# Patient Record
Sex: Female | Born: 1975 | Race: White | Hispanic: No | Marital: Married | Smoking: Former smoker
Health system: Southern US, Community
[De-identification: ages and names within clinical notes are randomized; demographics above are authoritative.]

## PROBLEM LIST (undated history)

## (undated) ENCOUNTER — Inpatient Hospital Stay: Payer: Self-pay

## (undated) DIAGNOSIS — I10 Essential (primary) hypertension: Secondary | ICD-10-CM

## (undated) DIAGNOSIS — I330 Acute and subacute infective endocarditis: Secondary | ICD-10-CM

## (undated) DIAGNOSIS — G2581 Restless legs syndrome: Secondary | ICD-10-CM

## (undated) DIAGNOSIS — B9562 Methicillin resistant Staphylococcus aureus infection as the cause of diseases classified elsewhere: Secondary | ICD-10-CM

## (undated) DIAGNOSIS — M1990 Unspecified osteoarthritis, unspecified site: Secondary | ICD-10-CM

## (undated) DIAGNOSIS — R7881 Bacteremia: Secondary | ICD-10-CM

## (undated) DIAGNOSIS — M869 Osteomyelitis, unspecified: Secondary | ICD-10-CM

## (undated) DIAGNOSIS — K56609 Unspecified intestinal obstruction, unspecified as to partial versus complete obstruction: Secondary | ICD-10-CM

## (undated) DIAGNOSIS — E119 Type 2 diabetes mellitus without complications: Secondary | ICD-10-CM

## (undated) DIAGNOSIS — F419 Anxiety disorder, unspecified: Secondary | ICD-10-CM

## (undated) DIAGNOSIS — G709 Myoneural disorder, unspecified: Secondary | ICD-10-CM

## (undated) DIAGNOSIS — M4626 Osteomyelitis of vertebra, lumbar region: Secondary | ICD-10-CM

## (undated) DIAGNOSIS — K3189 Other diseases of stomach and duodenum: Secondary | ICD-10-CM

## (undated) DIAGNOSIS — J45909 Unspecified asthma, uncomplicated: Secondary | ICD-10-CM

## (undated) HISTORY — DX: Other diseases of stomach and duodenum: K31.89

---

## 2010-04-16 ENCOUNTER — Emergency Department: Payer: Self-pay | Admitting: Emergency Medicine

## 2010-08-14 ENCOUNTER — Inpatient Hospital Stay: Payer: Self-pay | Admitting: Internal Medicine

## 2011-01-18 ENCOUNTER — Emergency Department: Payer: Self-pay | Admitting: Emergency Medicine

## 2011-04-29 ENCOUNTER — Ambulatory Visit: Payer: Self-pay | Admitting: Pain Medicine

## 2011-06-15 ENCOUNTER — Emergency Department: Payer: Self-pay | Admitting: Emergency Medicine

## 2011-10-21 ENCOUNTER — Encounter: Payer: Self-pay | Admitting: Internal Medicine

## 2011-11-15 ENCOUNTER — Encounter: Payer: Self-pay | Admitting: Internal Medicine

## 2011-12-21 ENCOUNTER — Emergency Department: Payer: Self-pay | Admitting: Emergency Medicine

## 2011-12-21 LAB — COMPREHENSIVE METABOLIC PANEL
Albumin: 4 g/dL (ref 3.4–5.0)
Alkaline Phosphatase: 60 U/L (ref 50–136)
Calcium, Total: 8.1 mg/dL — ABNORMAL LOW (ref 8.5–10.1)
Chloride: 118 mmol/L — ABNORMAL HIGH (ref 98–107)
Co2: 22 mmol/L (ref 21–32)
EGFR (African American): >60
Glucose: 102 mg/dL — ABNORMAL HIGH (ref 65–99)
Potassium: 4 mmol/L (ref 3.5–5.1)
SGOT(AST): 20 U/L (ref 15–37)
Sodium: 152 mmol/L — ABNORMAL HIGH (ref 136–145)
Total Protein: 7 g/dL (ref 6.4–8.2)

## 2011-12-21 LAB — CBC
HCT: 41.2 % (ref 35.0–47.0)
HGB: 14.2 g/dL (ref 12.0–16.0)
MCH: 33.8 pg (ref 26.0–34.0)
MCV: 98 fL (ref 80–100)
Platelet: 222 10*3/uL (ref 150–440)
RBC: 4.19 10*6/uL (ref 3.80–5.20)

## 2011-12-21 LAB — ETHANOL
Ethanol %: 0.184 % — ABNORMAL HIGH (ref 0.000–0.080)
Ethanol: 301 mg/dL

## 2012-01-13 ENCOUNTER — Ambulatory Visit: Payer: Self-pay | Admitting: Internal Medicine

## 2012-03-30 ENCOUNTER — Emergency Department: Payer: Self-pay | Admitting: Emergency Medicine

## 2012-03-30 LAB — COMPREHENSIVE METABOLIC PANEL
Albumin: 4.3 g/dL (ref 3.4–5.0)
Alkaline Phosphatase: 91 U/L (ref 50–136)
Anion Gap: 10 (ref 7–16)
BUN: 8 mg/dL (ref 7–18)
Calcium, Total: 9.6 mg/dL (ref 8.5–10.1)
Creatinine: 0.64 mg/dL (ref 0.60–1.30)
EGFR (African American): >60
Glucose: 122 mg/dL — ABNORMAL HIGH (ref 65–99)
Osmolality: 283 (ref 275–301)
SGOT(AST): 25 U/L (ref 15–37)
SGPT (ALT): 23 U/L

## 2012-03-30 LAB — URINALYSIS, COMPLETE
Bilirubin,UR: NEGATIVE
Blood: NEGATIVE
Glucose,UR: NEGATIVE mg/dL (ref 0–75)
Ph: 8 (ref 4.5–8.0)
Protein: 30
Squamous Epithelial: 13
WBC UR: NONE SEEN /HPF (ref 0–5)

## 2012-03-30 LAB — CBC
HGB: 14.5 g/dL (ref 12.0–16.0)
MCHC: 33 g/dL (ref 32.0–36.0)
MCV: 98 fL (ref 80–100)
Platelet: 281 10*3/uL (ref 150–440)
RBC: 4.47 10*6/uL (ref 3.80–5.20)
RDW: 12.4 % (ref 11.5–14.5)
WBC: 10.8 10*3/uL (ref 3.6–11.0)

## 2012-03-30 LAB — HCG, QUANTITATIVE, PREGNANCY: Beta Hcg, Quant.: <1 m[IU]/mL — ABNORMAL LOW

## 2012-03-30 LAB — LIPASE, BLOOD: Lipase: 73 U/L (ref 73–393)

## 2013-02-21 ENCOUNTER — Emergency Department: Payer: Self-pay | Admitting: Emergency Medicine

## 2014-03-01 ENCOUNTER — Emergency Department: Payer: Self-pay | Admitting: Emergency Medicine

## 2014-03-01 LAB — URINALYSIS, COMPLETE
BILIRUBIN, UR: NEGATIVE
Bacteria: NONE SEEN
Blood: NEGATIVE
Glucose,UR: 50 mg/dL (ref 0–75)
Leukocyte Esterase: NEGATIVE
NITRITE: NEGATIVE
PH: 8 (ref 4.5–8.0)
PROTEIN: NEGATIVE
RBC,UR: <1 /HPF (ref 0–5)
Specific Gravity: 1.012 (ref 1.003–1.030)
Squamous Epithelial: <1
WBC UR: 5 /HPF (ref 0–5)

## 2014-03-01 LAB — DRUG SCREEN, URINE
AMPHETAMINES, UR SCREEN: NEGATIVE (ref ?–1000)
Barbiturates, Ur Screen: NEGATIVE (ref ?–200)
Benzodiazepine, Ur Scrn: NEGATIVE (ref ?–200)
Cannabinoid 50 Ng, Ur ~~LOC~~: POSITIVE (ref ?–50)
Cocaine Metabolite,Ur ~~LOC~~: NEGATIVE (ref ?–300)
MDMA (ECSTASY) UR SCREEN: NEGATIVE (ref ?–500)
Methadone, Ur Screen: NEGATIVE (ref ?–300)
Opiate, Ur Screen: POSITIVE (ref ?–300)
Phencyclidine (PCP) Ur S: NEGATIVE (ref ?–25)
Tricyclic, Ur Screen: NEGATIVE (ref ?–1000)

## 2014-03-01 LAB — CBC WITH DIFFERENTIAL/PLATELET
BASOS ABS: 0.1 10*3/uL (ref 0.0–0.1)
Basophil %: 0.8 %
Eosinophil #: 0.1 10*3/uL (ref 0.0–0.7)
Eosinophil %: 0.9 %
HCT: 42.1 % (ref 35.0–47.0)
HGB: 14.2 g/dL (ref 12.0–16.0)
LYMPHS ABS: 2 10*3/uL (ref 1.0–3.6)
LYMPHS PCT: 17 %
MCH: 32.5 pg (ref 26.0–34.0)
MCHC: 33.7 g/dL (ref 32.0–36.0)
MCV: 96 fL (ref 80–100)
MONO ABS: 0.5 x10 3/mm (ref 0.2–0.9)
MONOS PCT: 4.5 %
NEUTROS ABS: 9 10*3/uL — AB (ref 1.4–6.5)
Neutrophil %: 76.8 %
Platelet: 324 10*3/uL (ref 150–440)
RBC: 4.37 10*6/uL (ref 3.80–5.20)
RDW: 12.5 % (ref 11.5–14.5)
WBC: 11.7 10*3/uL — ABNORMAL HIGH (ref 3.6–11.0)

## 2014-03-01 LAB — COMPREHENSIVE METABOLIC PANEL
ALT: 20 U/L (ref 12–78)
Albumin: 4.3 g/dL (ref 3.4–5.0)
Alkaline Phosphatase: 67 U/L
Anion Gap: 12 (ref 7–16)
BUN: 11 mg/dL (ref 7–18)
Bilirubin,Total: 0.5 mg/dL (ref 0.2–1.0)
CHLORIDE: 104 mmol/L (ref 98–107)
CO2: 18 mmol/L — AB (ref 21–32)
Calcium, Total: 9.2 mg/dL (ref 8.5–10.1)
Creatinine: 0.7 mg/dL (ref 0.60–1.30)
EGFR (Non-African Amer.): >60
GLUCOSE: 182 mg/dL — AB (ref 65–99)
Osmolality: 272 (ref 275–301)
Potassium: 3.5 mmol/L (ref 3.5–5.1)
SGOT(AST): 16 U/L (ref 15–37)
SODIUM: 134 mmol/L — AB (ref 136–145)
Total Protein: 7.4 g/dL (ref 6.4–8.2)

## 2014-03-01 LAB — LIPASE, BLOOD: Lipase: 96 U/L (ref 73–393)

## 2014-03-01 LAB — HCG, QUANTITATIVE, PREGNANCY

## 2014-03-01 LAB — TROPONIN I: Troponin-I: <0.02 ng/mL

## 2015-04-04 ENCOUNTER — Inpatient Hospital Stay: Admission: RE | Admit: 2015-04-04 | Payer: Self-pay | Source: Ambulatory Visit

## 2015-04-07 ENCOUNTER — Ambulatory Visit: Admission: RE | Admit: 2015-04-07 | Payer: Medicaid Other | Source: Ambulatory Visit

## 2015-04-07 ENCOUNTER — Encounter: Admission: RE | Payer: Self-pay | Source: Ambulatory Visit

## 2015-04-07 SURGERY — FUSION, JOINT, GREAT TOE
Anesthesia: LOCAL | Laterality: Right

## 2015-06-24 ENCOUNTER — Emergency Department
Admission: EM | Admit: 2015-06-24 | Discharge: 2015-06-24 | Disposition: A | Discharge status: Home / Self Care | Payer: Medicaid Other | Attending: Emergency Medicine | Admitting: Emergency Medicine

## 2015-06-24 DIAGNOSIS — Z720 Tobacco use: Secondary | ICD-10-CM | POA: Diagnosis not present

## 2015-06-24 DIAGNOSIS — Z3202 Encounter for pregnancy test, result negative: Secondary | ICD-10-CM | POA: Diagnosis not present

## 2015-06-24 DIAGNOSIS — R1012 Left upper quadrant pain: Secondary | ICD-10-CM | POA: Insufficient documentation

## 2015-06-24 DIAGNOSIS — Z79899 Other long term (current) drug therapy: Secondary | ICD-10-CM | POA: Insufficient documentation

## 2015-06-24 DIAGNOSIS — R112 Nausea with vomiting, unspecified: Secondary | ICD-10-CM

## 2015-06-24 DIAGNOSIS — Z791 Long term (current) use of non-steroidal anti-inflammatories (NSAID): Secondary | ICD-10-CM | POA: Diagnosis not present

## 2015-06-24 HISTORY — DX: Unspecified asthma, uncomplicated: J45.909

## 2015-06-24 LAB — URINALYSIS COMPLETE WITH MICROSCOPIC (ARMC ONLY)
BILIRUBIN URINE: NEGATIVE
Bacteria, UA: NONE SEEN
Glucose, UA: NEGATIVE mg/dL
LEUKOCYTES UA: NEGATIVE
NITRITE: NEGATIVE
PH: 5 (ref 5.0–8.0)
PROTEIN: NEGATIVE mg/dL
SPECIFIC GRAVITY, URINE: 1.019 (ref 1.005–1.030)

## 2015-06-24 LAB — COMPREHENSIVE METABOLIC PANEL
ALK PHOS: 83 U/L (ref 38–126)
ALT: 24 U/L (ref 14–54)
ANION GAP: 9 (ref 5–15)
AST: 20 U/L (ref 15–41)
Albumin: 4.7 g/dL (ref 3.5–5.0)
BILIRUBIN TOTAL: 0.9 mg/dL (ref 0.3–1.2)
BUN: 12 mg/dL (ref 6–20)
CALCIUM: 9.4 mg/dL (ref 8.9–10.3)
CO2: 25 mmol/L (ref 22–32)
CREATININE: 0.63 mg/dL (ref 0.44–1.00)
Chloride: 103 mmol/L (ref 101–111)
Glucose, Bld: 107 mg/dL — ABNORMAL HIGH (ref 65–99)
Potassium: 3.8 mmol/L (ref 3.5–5.1)
SODIUM: 137 mmol/L (ref 135–145)
TOTAL PROTEIN: 7.6 g/dL (ref 6.5–8.1)

## 2015-06-24 LAB — CBC
HCT: 40.7 % (ref 35.0–47.0)
HEMOGLOBIN: 13.8 g/dL (ref 12.0–16.0)
MCH: 32.4 pg (ref 26.0–34.0)
MCHC: 33.9 g/dL (ref 32.0–36.0)
MCV: 95.4 fL (ref 80.0–100.0)
PLATELETS: 308 10*3/uL (ref 150–440)
RBC: 4.27 MIL/uL (ref 3.80–5.20)
RDW: 12.4 % (ref 11.5–14.5)
WBC: 9.6 10*3/uL (ref 3.6–11.0)

## 2015-06-24 LAB — LIPASE, BLOOD: Lipase: 13 U/L — ABNORMAL LOW (ref 22–51)

## 2015-06-24 LAB — POCT PREGNANCY, URINE: Preg Test, Ur: NEGATIVE

## 2015-06-24 MED ORDER — FAMOTIDINE 40 MG PO TABS: 40.0000 mg | ORAL_TABLET | Freq: Every evening | ORAL | Status: DC

## 2015-06-24 MED ORDER — FAMOTIDINE IN NACL 20-0.9 MG/50ML-% IV SOLN
20.0000 mg | Freq: Once | INTRAVENOUS | Status: AC
  Administered 2015-06-24: 20 mg via INTRAVENOUS
  Filled 2015-06-24: qty 50

## 2015-06-24 MED ORDER — SODIUM CHLORIDE 0.9 % IV BOLUS (SEPSIS)
1000.0000 mL | Freq: Once | INTRAVENOUS | Status: AC
  Administered 2015-06-24: 1000 mL via INTRAVENOUS

## 2015-06-24 MED ORDER — DICYCLOMINE HCL 10 MG PO CAPS
10.0000 mg | ORAL_CAPSULE | Freq: Once | ORAL | Status: AC
Start: 2015-06-24 — End: 2015-06-24
  Administered 2015-06-24: 10 mg via ORAL
  Filled 2015-06-24: qty 1

## 2015-06-24 MED ORDER — ONDANSETRON HCL 4 MG PO TABS: 4.0000 mg | ORAL_TABLET | Freq: Every day | ORAL | Status: DC | PRN

## 2015-06-24 MED ORDER — ONDANSETRON 4 MG PO TBDP
4.0000 mg | ORAL_TABLET | Freq: Once | ORAL | Status: AC | PRN
Start: 2015-06-24 — End: 2015-06-24
  Administered 2015-06-24: 4 mg via ORAL
  Filled 2015-06-24: qty 1

## 2015-06-24 MED ORDER — ONDANSETRON HCL 4 MG/2ML IJ SOLN
4.0000 mg | Freq: Once | INTRAMUSCULAR | Status: AC
  Administered 2015-06-24: 4 mg via INTRAVENOUS
  Filled 2015-06-24: qty 2

## 2015-06-24 NOTE — ED Provider Notes (Signed)
[facility] Emergency Department Provider Note  ____________________________________________  Time seen: Approximately 305 PM  I have reviewed the triage vital signs and the nursing notes.   HISTORY  Chief Complaint Nausea and Emesis    HPI Danielle Harrison is a 39 y.o. female with a history of transverse myelitis his presenting with on and off nausea vomiting or upper abdominal pain throughout the week. She says that she gets sick with a similar illness about once a month. She has no known sick contacts. She says that 2 days out of the week she did feel improved but has once again started vomiting today and yesterday. She says that she is not able to keep any fluids or food down. She has tried Tums at home but does not take a regular antacid.Says she has a history of gastric ulcers but has not any blood in her vomitus. One episode of diarrhea earlier this week. Says that she smokes less than a pack of cigarettes per day. Does drink up to 3-4 beers per day but says does not do this every day. And also uses marijuana says once every 1-2 weeks. Says that her left upper quadrant pain is only when she is vomiting. Eyes any dysuria. Denies any vaginal bleeding or discharge.   Past Medical History  Diagnosis Date  . Asthma     There are no active problems to display for this patient.   Past Surgical History  Procedure Laterality Date  . Cesarean section      x2    Current Outpatient Rx  Name  Route  Sig  Dispense  Refill  . DULoxetine (CYMBALTA) 30 MG capsule   Oral   Take 30 mg by mouth daily.      2   . LYRICA 150 MG capsule   Oral   Take 150 mg by mouth 2 (two) times daily.      2     Dispense as written.   . meloxicam (MOBIC) 15 MG tablet   Oral   Take 15 mg by mouth every morning. Take with food.      2   . PROAIR HFA 108 (90 BASE) MCG/ACT inhaler   Inhalation   Inhale 2 puffs into the lungs See admin instructions. Inhale 2 puffs  inhalation every 4 to 6 hours as needed for shortness of breath and or wheezing.      1     Dispense as written.   . SYMBICORT 80-4.5 MCG/ACT inhaler   Inhalation   Inhale 1 puff into the lungs 2 (two) times daily. Swish, and spit after every use.      5     Dispense as written.     Allergies Review of patient's allergies indicates no known allergies.  No family history on file.  Social History Social History  Substance Use Topics  . Smoking status: Current Every Day Smoker    Types: Cigarettes  . Smokeless tobacco: None  . Alcohol Use: Yes    Review of Systems Constitutional: No fever/chills Eyes: No visual changes. ENT: No sore throat. Cardiovascular: Denies chest pain. Respiratory: Denies shortness of breath. Gastrointestinal:  No constipation. Genitourinary: Negative for dysuria. Musculoskeletal: Negative for back pain. Skin: Negative for rash. Neurological: Negative for headaches, focal weakness or numbness.  10-point ROS otherwise negative.  ____________________________________________   PHYSICAL EXAM:  VITAL SIGNS: ED Triage Vitals  Enc Vitals Group     BP 06/24/15 1252 150/101 mmHg  Pulse Rate 06/24/15 1252 86     Resp 06/24/15 1528 14     Temp 06/24/15 1252 97.1 F (36.2 C)     Temp Source 06/24/15 1252 Oral     SpO2 06/24/15 1252 100 %     Weight 06/24/15 1252 135 lb (61.236 kg)     Height 06/24/15 1252 5' (1.524 m)     Head Cir --      Peak Flow --      Pain Score 06/24/15 1252 10     Pain Loc --      Pain Edu? --      Excl. in GC? --     Constitutional: Alert and oriented. Well appearing and in no acute distress. Eyes: Conjunctivae are normal. PERRL. EOMI. Head: Atraumatic. Nose: No congestion/rhinnorhea. Mouth/Throat: Mucous membranes are moist.  Oropharynx non-erythematous. Neck: No stridor.   Cardiovascular: Normal rate, regular rhythm. Grossly normal heart sounds.  Good peripheral circulation. Respiratory: Normal  respiratory effort.  No retractions. Lungs CTAB. Gastrointestinal: Soft with left upper quadrant tenderness to palpation. There is no rebound or guarding. No distention. No abdominal bruits. No CVA tenderness. Musculoskeletal: No lower extremity tenderness nor edema.  No joint effusions. Neurologic:  Normal speech and language. No gross focal neurologic deficits are appreciated. No gait instability. Skin:  Skin is warm, dry and intact. No rash noted. Psychiatric: Mood and affect are normal. Speech and behavior are normal.  ____________________________________________   LABS (all labs ordered are listed, but only abnormal results are displayed)  Labs Reviewed  LIPASE, BLOOD - Abnormal; Notable for the following:    Lipase 13 (*)    All other components within normal limits  COMPREHENSIVE METABOLIC PANEL - Abnormal; Notable for the following:    Glucose, Bld 107 (*)    All other components within normal limits  URINALYSIS COMPLETEWITH MICROSCOPIC ([facility] ONLY) - Abnormal; Notable for the following:    Color, Urine YELLOW (*)    APPearance CLEAR (*)    Ketones, ur 1+ (*)    Hgb urine dipstick 1+ (*)    Squamous Epithelial / LPF 0-5 (*)    All other components within normal limits  CBC  POC URINE PREG, ED  POCT PREGNANCY, URINE   ____________________________________________  EKG   ____________________________________________  RADIOLOGY   ____________________________________________   PROCEDURES   ____________________________________________   INITIAL IMPRESSION / ASSESSMENT AND PLAN / ED COURSE  Pertinent labs & imaging results that were available during my care of the patient were reviewed by me and considered in my medical decision making (see chart for details).  ----------------------------------------- 6:03 PM on 06/24/2015 -----------------------------------------  Patient improved symptomatically after IV fluids as well as medication. Able to tolerate by  mouth fluids at this time. We'll discharge to home. Counseled patient as well as her mother that drinking as well as marijuana use may be contributing to her cyclic vomiting issues. She says that she will reduce her consumption of both. We'll give her follow-up with gastroenterology. Still denying any pain at this time. ____________________________________________   FINAL CLINICAL IMPRESSION(S) / ED DIAGNOSES  Cyclic nausea and vomiting.   Myrna Blazer, MD 06/24/15 [phone]

## 2015-06-24 NOTE — ED Notes (Signed)
Pt c/o N/V since Monday, states it got better wed-Thursday but then worsened again yesterday..denies pain

## 2015-06-24 NOTE — ED Notes (Signed)
Pt stable at time of discharge. 

## 2015-06-24 NOTE — Discharge Instructions (Signed)

## 2017-12-25 ENCOUNTER — Emergency Department
Admission: EM | Admit: 2017-12-25 | Discharge: 2017-12-25 | Disposition: A | Discharge status: Home / Self Care | Payer: No Typology Code available for payment source | Attending: Emergency Medicine | Admitting: Emergency Medicine

## 2017-12-25 ENCOUNTER — Other Ambulatory Visit: Payer: Self-pay

## 2017-12-25 ENCOUNTER — Emergency Department: Payer: No Typology Code available for payment source

## 2017-12-25 ENCOUNTER — Encounter: Payer: Self-pay | Admitting: Emergency Medicine

## 2017-12-25 DIAGNOSIS — S39012A Strain of muscle, fascia and tendon of lower back, initial encounter: Secondary | ICD-10-CM | POA: Diagnosis not present

## 2017-12-25 DIAGNOSIS — F17210 Nicotine dependence, cigarettes, uncomplicated: Secondary | ICD-10-CM | POA: Diagnosis not present

## 2017-12-25 DIAGNOSIS — Y939 Activity, unspecified: Secondary | ICD-10-CM | POA: Insufficient documentation

## 2017-12-25 DIAGNOSIS — Y929 Unspecified place or not applicable: Secondary | ICD-10-CM | POA: Insufficient documentation

## 2017-12-25 DIAGNOSIS — S3992XA Unspecified injury of lower back, initial encounter: Secondary | ICD-10-CM | POA: Diagnosis present

## 2017-12-25 DIAGNOSIS — Y999 Unspecified external cause status: Secondary | ICD-10-CM | POA: Diagnosis not present

## 2017-12-25 DIAGNOSIS — Z79899 Other long term (current) drug therapy: Secondary | ICD-10-CM | POA: Diagnosis not present

## 2017-12-25 DIAGNOSIS — J45909 Unspecified asthma, uncomplicated: Secondary | ICD-10-CM | POA: Insufficient documentation

## 2017-12-25 LAB — POCT PREGNANCY, URINE: Preg Test, Ur: NEGATIVE

## 2017-12-25 MED ORDER — BACLOFEN 10 MG PO TABS: 10.0000 mg | ORAL_TABLET | Freq: Every day | ORAL | 1 refills | Status: AC

## 2017-12-25 MED ORDER — MELOXICAM 15 MG PO TABS: 15.0000 mg | ORAL_TABLET | Freq: Every day | ORAL | 2 refills | Status: AC

## 2017-12-25 NOTE — Discharge Instructions (Signed)
Follow-up with your regular doctor or Dr. Odis LusterBowers if you are not better in 5-7 days.  Use ice for the next 3 days.  Then use wet heat followed by ice.  As prescribed.  Do not operate heavy machinery or car while taking the baclofen.  If you are worsening please return the emergency department
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
## 2018-06-27 ENCOUNTER — Emergency Department
Admission: EM | Admit: 2018-06-27 | Discharge: 2018-06-27 | Disposition: A | Discharge status: Home / Self Care | Payer: Self-pay | Attending: Emergency Medicine | Admitting: Emergency Medicine

## 2018-06-27 ENCOUNTER — Encounter: Payer: Self-pay | Admitting: Emergency Medicine

## 2018-06-27 ENCOUNTER — Emergency Department: Payer: Self-pay

## 2018-06-27 ENCOUNTER — Other Ambulatory Visit: Payer: Self-pay

## 2018-06-27 DIAGNOSIS — Z79899 Other long term (current) drug therapy: Secondary | ICD-10-CM | POA: Insufficient documentation

## 2018-06-27 DIAGNOSIS — K859 Acute pancreatitis without necrosis or infection, unspecified: Secondary | ICD-10-CM | POA: Insufficient documentation

## 2018-06-27 DIAGNOSIS — F17210 Nicotine dependence, cigarettes, uncomplicated: Secondary | ICD-10-CM | POA: Insufficient documentation

## 2018-06-27 DIAGNOSIS — J45909 Unspecified asthma, uncomplicated: Secondary | ICD-10-CM | POA: Insufficient documentation

## 2018-06-27 LAB — COMPREHENSIVE METABOLIC PANEL
ALBUMIN: 4.4 g/dL (ref 3.5–5.0)
ALT: 32 U/L (ref 0–44)
ANION GAP: 9 (ref 5–15)
AST: 36 U/L (ref 15–41)
Alkaline Phosphatase: 92 U/L (ref 38–126)
BUN: 11 mg/dL (ref 6–20)
CHLORIDE: 104 mmol/L (ref 98–111)
CO2: 24 mmol/L (ref 22–32)
Calcium: 9.9 mg/dL (ref 8.9–10.3)
Creatinine, Ser: 0.59 mg/dL (ref 0.44–1.00)
GFR calc Af Amer: >60 mL/min (ref >60)
GFR calc non Af Amer: >60 mL/min (ref >60)
GLUCOSE: 88 mg/dL (ref 70–99)
Potassium: 4.3 mmol/L (ref 3.5–5.1)
SODIUM: 137 mmol/L (ref 135–145)
Total Bilirubin: 1.1 mg/dL (ref 0.3–1.2)
Total Protein: 8.2 g/dL — ABNORMAL HIGH (ref 6.5–8.1)

## 2018-06-27 LAB — CBC
HEMATOCRIT: 41.1 % (ref 35.0–47.0)
HEMOGLOBIN: 14.4 g/dL (ref 12.0–16.0)
MCH: 37.1 pg — ABNORMAL HIGH (ref 26.0–34.0)
MCHC: 35.2 g/dL (ref 32.0–36.0)
MCV: 105.4 fL — AB (ref 80.0–100.0)
Platelets: 314 10*3/uL (ref 150–440)
RBC: 3.89 MIL/uL (ref 3.80–5.20)
RDW: 13.6 % (ref 11.5–14.5)
WBC: 11 10*3/uL (ref 3.6–11.0)

## 2018-06-27 LAB — URINALYSIS, COMPLETE (UACMP) WITH MICROSCOPIC
BACTERIA UA: NONE SEEN
Bilirubin Urine: NEGATIVE
Glucose, UA: NEGATIVE mg/dL
KETONES UR: NEGATIVE mg/dL
LEUKOCYTES UA: NEGATIVE
Nitrite: NEGATIVE
PROTEIN: NEGATIVE mg/dL
Specific Gravity, Urine: 1.017 (ref 1.005–1.030)
pH: 5 (ref 5.0–8.0)

## 2018-06-27 LAB — POC URINE PREG, ED: PREG TEST UR: NEGATIVE — NL

## 2018-06-27 LAB — LIPASE, BLOOD: LIPASE: 181 U/L — AB (ref 11–51)

## 2018-06-27 MED ORDER — HYDROCODONE-ACETAMINOPHEN 5-325 MG PO TABS: 1.0000 | ORAL_TABLET | ORAL | 0 refills | Status: DC | PRN

## 2018-06-27 MED ORDER — ONDANSETRON HCL 4 MG/2ML IJ SOLN
4.0000 mg | Freq: Once | INTRAMUSCULAR | Status: AC
  Administered 2018-06-27: 4 mg via INTRAVENOUS
  Filled 2018-06-27: qty 2

## 2018-06-27 MED ORDER — SODIUM CHLORIDE 0.9 % IV BOLUS
1000.0000 mL | Freq: Once | INTRAVENOUS | Status: AC
  Administered 2018-06-27: 1000 mL via INTRAVENOUS

## 2018-06-27 MED ORDER — MORPHINE SULFATE (PF) 4 MG/ML IV SOLN
4.0000 mg | Freq: Once | INTRAVENOUS | Status: AC
  Administered 2018-06-27: 4 mg via INTRAVENOUS
  Filled 2018-06-27: qty 1

## 2018-06-27 NOTE — Discharge Instructions (Addendum)
As we discussed for the next 48 hours please adhere to a clear liquid diet only.  After which she may adhere to a low-fat diet.  Do not eat anything fatty oily or greasy for at least 7 days.  Avoid alcohol use over the next 7 days.  Please take your pain medication as needed, but only as written.  Return to the emergency department for any significant increase in pain, or any other symptom personally concerning to yourself.

## 2018-06-27 NOTE — ED Notes (Signed)
Patient transported to Ultrasound at this time. 

## 2018-06-27 NOTE — ED Triage Notes (Signed)
C/o epigastric pain/bloating. Feels difficult to breath at times.  Denies fever, NVD.  Color WNL. VSS.  Pain is constant.  Ambulatory.

## 2018-06-27 NOTE — ED Provider Notes (Signed)
[facility] Emergency Department Provider Note  Time seen: 10:25 AM  I have reviewed the triage vital signs and the nursing notes.   HISTORY  Chief Complaint Abdominal Pain    HPI Danielle Harrison is a 42 y.o. female with a past medical history of asthma who presents to the emergency department for upper abdominal pain.  According to the patient over the past 3 days she has been experiencing moderate dull aching pain across her upper abdomen and somewhat into the right upper quadrant.  Denies any association with food.  Denies any nausea vomiting or diarrhea.  No dysuria, hematuria vaginal bleeding or discharge.  Her last menstrual period ended approximately 1 week ago.   Past Medical History:  Diagnosis Date  . Asthma     There are no active problems to display for this patient.   Past Surgical History:  Procedure Laterality Date  . CESAREAN SECTION     x2    Prior to Admission medications   Medication Sig Start Date End Date Taking? Authorizing Provider  baclofen (LIORESAL) 10 MG tablet Take 1 tablet (10 mg total) by mouth daily. 12/25/17 12/25/18  Fisher, Roselyn Bering, PA-C  DULoxetine (CYMBALTA) 30 MG capsule Take 30 mg by mouth daily. 05/23/15   [provider]  famotidine (PEPCID) 40 MG tablet Take 1 tablet (40 mg total) by mouth every evening. 06/24/15 06/23/16  Schaevitz, Myra Rude, MD  LYRICA 150 MG capsule Take 150 mg by mouth 2 (two) times daily. 05/23/15   [provider]  meloxicam (MOBIC) 15 MG tablet Take 15 mg by mouth every morning. Take with food. 05/23/15   [provider]  meloxicam (MOBIC) 15 MG tablet Take 1 tablet (15 mg total) by mouth daily. 12/25/17 12/25/18  Fisher, Roselyn Bering, PA-C  ondansetron (ZOFRAN) 4 MG tablet Take 1 tablet (4 mg total) by mouth daily as needed for nausea or vomiting. 06/24/15   Myrna Blazer, MD  PROAIR HFA 108 (90 BASE) MCG/ACT inhaler Inhale 2 puffs into the lungs See admin  instructions. Inhale 2 puffs inhalation every 4 to 6 hours as needed for shortness of breath and or wheezing. 05/23/15   [provider]  SYMBICORT 80-4.5 MCG/ACT inhaler Inhale 1 puff into the lungs 2 (two) times daily. Swish, and spit after every use. 05/23/15   [provider]    No Known Allergies  History reviewed. No pertinent family history.  Social History Social History   Tobacco Use  . Smoking status: Current Every Day Smoker    Types: Cigarettes  . Smokeless tobacco: Never Used  Substance Use Topics  . Alcohol use: Yes  . Drug use: Not on file    Review of Systems Constitutional: Negative for fever. Cardiovascular: Negative for chest pain. Respiratory: Negative for shortness of breath. Gastrointestinal: Mild to moderate dull aching pain across the upper abdomen especially the mid abdomen to right upper quadrant.  Negative for nausea vomiting or diarrhea Genitourinary: Negative for urinary compaints Musculoskeletal: Negative for musculoskeletal complaints Skin: Negative for skin complaints  Neurological: Negative for headache All other ROS negative  ____________________________________________   PHYSICAL EXAM:  VITAL SIGNS: ED Triage Vitals  Enc Vitals Group     BP 06/27/18 0857 (!) 147/107     Pulse Rate 06/27/18 0857 (!) 110     Resp 06/27/18 0857 16     Temp 06/27/18 0857 98 F (36.7 C)     Temp Source 06/27/18 0857 Oral  SpO2 06/27/18 0857 100 %     Weight 06/27/18 0855 130 lb (59 kg)     Height 06/27/18 0855 5\' 1"  (1.549 m)     Head Circumference --      Peak Flow --      Pain Score 06/27/18 0855 7     Pain Loc --      Pain Edu? --      Excl. in GC? --    Constitutional: Alert and oriented. Well appearing and in no distress. Eyes: Normal exam ENT   Head: Normocephalic and atraumatic.   Mouth/Throat: Mucous membranes are moist. Cardiovascular: Normal rate, regular rhythm. No murmur Respiratory: Normal respiratory effort  without tachypnea nor retractions. Breath sounds are clear Gastrointestinal: Soft, mild tenderness across the epigastrium and right upper quadrant.  Abdomen otherwise benign without rebound guarding or distention. Musculoskeletal: Nontender with normal range of motion in all extremities.  Neurologic:  Normal speech and language. No gross focal neurologic deficits  Skin:  Skin is warm, dry and intact.  Psychiatric: Mood and affect are normal.   ____________________________________________    EKG  EKG reviewed and interpreted by myself shows sinus rhythm at 98 bpm with a narrow QRS, normal axis, normal intervals, no concerning ST changes.  ____________________________________________    RADIOLOGY  Ultrasound negative  ____________________________________________   INITIAL IMPRESSION / ASSESSMENT AND PLAN / ED COURSE  Pertinent labs & imaging results that were available during my care of the patient were reviewed by me and considered in my medical decision making (see chart for details).  Patient presents to the emergency department for upper abdominal pain over the past 3 days.  Differential would include cholecystitis, biliary colic, pancreatitis, gastritis, gastric or peptic ulcer disease.  We will check labs and continue to closely monitor.  We will treat pain and nausea as well as IV hydrate.  Patient's labs are resulted showing an elevated lipase of 181.  Patient does admit to alcohol use.  She does still have her gallbladder as well.  As the pain also extends somewhat to the right upper quadrant will obtain a right upper quadrant ultrasound to rule out gallbladder disease or common duct stone.  Suspect likely alcohol induced pancreatitis.  Ultrasound is negative.  Urinalysis is largely negative.  Patency test negative.  Highly suspect pancreatitis possibly alcohol induced.  I discussed precautions with the patient as far as dietary precautions and return  precautions.  ____________________________________________   FINAL CLINICAL IMPRESSION(S) / ED DIAGNOSES  Pancreatitis    Minna AntisPaduchowski, Javiel Canepa, MD 06/27/18 1234

## 2018-06-27 NOTE — ED Notes (Signed)
NAD noted at time of D/C. Pt denies questions or concerns. Pt ambulatory to the lobby at this time. Pt to go to lobby and call for ride states understanding about driving precautions.

## 2018-12-21 ENCOUNTER — Encounter: Payer: Self-pay | Admitting: Emergency Medicine

## 2018-12-21 ENCOUNTER — Other Ambulatory Visit: Payer: Self-pay

## 2018-12-21 ENCOUNTER — Emergency Department
Admission: EM | Admit: 2018-12-21 | Discharge: 2018-12-21 | Disposition: A | Discharge status: Home / Self Care | Payer: 59 | Attending: Emergency Medicine | Admitting: Emergency Medicine

## 2018-12-21 DIAGNOSIS — F17210 Nicotine dependence, cigarettes, uncomplicated: Secondary | ICD-10-CM | POA: Diagnosis not present

## 2018-12-21 DIAGNOSIS — Z79899 Other long term (current) drug therapy: Secondary | ICD-10-CM | POA: Diagnosis not present

## 2018-12-21 DIAGNOSIS — R1013 Epigastric pain: Secondary | ICD-10-CM | POA: Diagnosis present

## 2018-12-21 DIAGNOSIS — R1033 Periumbilical pain: Secondary | ICD-10-CM

## 2018-12-21 DIAGNOSIS — J45909 Unspecified asthma, uncomplicated: Secondary | ICD-10-CM | POA: Diagnosis not present

## 2018-12-21 LAB — URINALYSIS, COMPLETE (UACMP) WITH MICROSCOPIC
Bacteria, UA: NONE SEEN
Bilirubin Urine: NEGATIVE
GLUCOSE, UA: NEGATIVE mg/dL
Hgb urine dipstick: NEGATIVE
KETONES UR: NEGATIVE mg/dL
Leukocytes,Ua: NEGATIVE
Nitrite: NEGATIVE
PROTEIN: 30 mg/dL — AB
Specific Gravity, Urine: 1.023 (ref 1.005–1.030)
pH: 5 (ref 5.0–8.0)

## 2018-12-21 LAB — CBC
HEMATOCRIT: 40.7 % (ref 36.0–46.0)
Hemoglobin: 13.7 g/dL (ref 12.0–15.0)
MCH: 35.2 pg — AB (ref 26.0–34.0)
MCHC: 33.7 g/dL (ref 30.0–36.0)
MCV: 104.6 fL — AB (ref 80.0–100.0)
PLATELETS: 324 10*3/uL (ref 150–400)
RBC: 3.89 MIL/uL (ref 3.87–5.11)
RDW: 12.7 % (ref 11.5–15.5)
WBC: 7.5 10*3/uL (ref 4.0–10.5)
nRBC: 0 % (ref 0.0–0.2)

## 2018-12-21 LAB — COMPREHENSIVE METABOLIC PANEL
ALBUMIN: 4.2 g/dL (ref 3.5–5.0)
ALK PHOS: 85 U/L (ref 38–126)
ALT: 27 U/L (ref 0–44)
AST: 36 U/L (ref 15–41)
Anion gap: 11 (ref 5–15)
BUN: 6 mg/dL (ref 6–20)
CALCIUM: 9.2 mg/dL (ref 8.9–10.3)
CHLORIDE: 105 mmol/L (ref 98–111)
CO2: 22 mmol/L (ref 22–32)
CREATININE: 0.67 mg/dL (ref 0.44–1.00)
GFR calc Af Amer: >60 mL/min (ref >60)
GFR calc non Af Amer: >60 mL/min (ref >60)
GLUCOSE: 138 mg/dL — AB (ref 70–99)
Potassium: 4 mmol/L (ref 3.5–5.1)
SODIUM: 138 mmol/L (ref 135–145)
Total Bilirubin: 0.9 mg/dL (ref 0.3–1.2)
Total Protein: 7.6 g/dL (ref 6.5–8.1)

## 2018-12-21 LAB — LIPASE, BLOOD: Lipase: 62 U/L — ABNORMAL HIGH (ref 11–51)

## 2018-12-21 MED ORDER — MORPHINE SULFATE (PF) 4 MG/ML IV SOLN
4.0000 mg | INTRAVENOUS | Status: DC | PRN
  Administered 2018-12-21: 4 mg via INTRAVENOUS
  Filled 2018-12-21: qty 1

## 2018-12-21 MED ORDER — ALUM & MAG HYDROXIDE-SIMETH 200-200-20 MG/5ML PO SUSP
30.0000 mL | Freq: Once | ORAL | Status: AC
  Administered 2018-12-21: 30 mL via ORAL
  Filled 2018-12-21: qty 30

## 2018-12-21 MED ORDER — ONDANSETRON HCL 4 MG/2ML IJ SOLN
4.0000 mg | INTRAMUSCULAR | Status: DC | PRN
  Administered 2018-12-21: 4 mg via INTRAVENOUS
  Filled 2018-12-21: qty 2

## 2018-12-21 MED ORDER — HYDROCODONE-ACETAMINOPHEN 5-325 MG PO TABS: 1.0000 | ORAL_TABLET | Freq: Four times a day (QID) | ORAL | 0 refills | Status: DC | PRN

## 2018-12-21 MED ORDER — LIDOCAINE VISCOUS HCL 2 % MT SOLN
15.0000 mL | Freq: Once | OROMUCOSAL | Status: AC
  Administered 2018-12-21: 15 mL via ORAL
  Filled 2018-12-21: qty 15

## 2018-12-21 NOTE — ED Triage Notes (Signed)
Pt presents to ED via POV c/o mid-abd pain starting yesterday at 1600. Pt states pain feels similar to previous bouts of pancreatitis.

## 2018-12-21 NOTE — ED Notes (Signed)
Sent green and purple top tubes to lab. 

## 2018-12-21 NOTE — ED Provider Notes (Signed)
[facility] Emergency Department Provider Note   ____________________________________________    I have reviewed the triage vital signs and the nursing notes.   HISTORY  Chief Complaint Abdominal Pain     HPI Danielle Harrison is a 43 y.o. female who presents with complaints of epigastric abdominal pain.  Patient describes burning abdominal discomfort which is been ongoing for approximately 24 hours now.  She does report a history of pancreatitis in the past.  History of alcoholism but has been sober for many years now.  She reports this feels somewhat similar to prior episodes of pancreatitis although no radiation to her back.  Mild nausea no vomiting.  No diarrhea.  No recent travel.  Does not take anything for this.  Past Medical History:  Diagnosis Date  . Asthma     There are no active problems to display for this patient.   Past Surgical History:  Procedure Laterality Date  . CESAREAN SECTION     x2    Prior to Admission medications   Medication Sig Start Date End Date Taking? Authorizing Provider  baclofen (LIORESAL) 10 MG tablet Take 1 tablet (10 mg total) by mouth daily. 12/25/17 12/25/18  Fisher, Roselyn Bering, PA-C  DULoxetine (CYMBALTA) 30 MG capsule Take 30 mg by mouth daily. 05/23/15   [provider]  famotidine (PEPCID) 40 MG tablet Take 1 tablet (40 mg total) by mouth every evening. 06/24/15 06/23/16  Myrna Blazer, MD  HYDROcodone-acetaminophen (NORCO/VICODIN) 5-325 MG tablet Take 1 tablet by mouth every 6 (six) hours as needed for moderate pain. 12/21/18   Jene Every, MD  LYRICA 150 MG capsule Take 150 mg by mouth 2 (two) times daily. 05/23/15   [provider]  meloxicam (MOBIC) 15 MG tablet Take 15 mg by mouth every morning. Take with food. 05/23/15   [provider]  meloxicam (MOBIC) 15 MG tablet Take 1 tablet (15 mg total) by mouth daily. 12/25/17 12/25/18  Fisher, Roselyn Bering, PA-C  ondansetron (ZOFRAN) 4  MG tablet Take 1 tablet (4 mg total) by mouth daily as needed for nausea or vomiting. 06/24/15   Myrna Blazer, MD  PROAIR HFA 108 (90 BASE) MCG/ACT inhaler Inhale 2 puffs into the lungs See admin instructions. Inhale 2 puffs inhalation every 4 to 6 hours as needed for shortness of breath and or wheezing. 05/23/15   [provider]  SYMBICORT 80-4.5 MCG/ACT inhaler Inhale 1 puff into the lungs 2 (two) times daily. Swish, and spit after every use. 05/23/15   [provider]     Allergies Patient has no known allergies.  History reviewed. No pertinent family history.  Social History Social History   Tobacco Use  . Smoking status: Current Every Day Smoker    Packs/day: 0.50    Types: Cigarettes  . Smokeless tobacco: Never Used  Substance Use Topics  . Alcohol use: Yes  . Drug use: Not on file    Review of Systems  Constitutional: No fever/chills Eyes: No visual changes.  ENT: No sore throat. Cardiovascular: Denies chest pain. Respiratory: Denies shortness of breath. Gastrointestinal: As above Genitourinary: Negative for dysuria. Musculoskeletal: Negative for back pain. Skin: Negative for rash. Neurological: Negative for headaches or weakness   ____________________________________________   PHYSICAL EXAM:  VITAL SIGNS: ED Triage Vitals  Enc Vitals Group     BP 12/21/18 1505 (!) 143/87     Pulse Rate 12/21/18 1505 (!) 101     Resp 12/21/18 1835  18     Temp 12/21/18 1505 98.4 F (36.9 C)     Temp Source 12/21/18 1505 Oral     SpO2 12/21/18 1505 100 %     Weight 12/21/18 1506 59 kg (130 lb)     Height 12/21/18 1506 1.524 m (5')     Head Circumference --      Peak Flow --      Pain Score 12/21/18 1509 10     Pain Loc --      Pain Edu? --      Excl. in GC? --     Constitutional: Alert and oriented.  Eyes: Conjunctivae are normal.   Nose: No congestion/rhinnorhea. Mouth/Throat: Mucous membranes are moist.    Cardiovascular: Normal  rate, regular rhythm. Grossly normal heart sounds.  Good peripheral circulation. Respiratory: Normal respiratory effort.  No retractions. Lungs CTAB. Gastrointestinal: Mild tenderness to palpation of the epigastrium, no distention  Musculoskeletal:  Warm and well perfused Neurologic:  Normal speech and language. No gross focal neurologic deficits are appreciated.  Skin:  Skin is warm, dry and intact. No rash noted. Psychiatric: Mood and affect are normal. Speech and behavior are normal.  ____________________________________________   LABS (all labs ordered are listed, but only abnormal results are displayed)  Labs Reviewed  LIPASE, BLOOD - Abnormal; Notable for the following components:      Result Value   Lipase 62 (*)    All other components within normal limits  COMPREHENSIVE METABOLIC PANEL - Abnormal; Notable for the following components:   Glucose, Bld 138 (*)    All other components within normal limits  CBC - Abnormal; Notable for the following components:   MCV 104.6 (*)    MCH 35.2 (*)    All other components within normal limits  URINALYSIS, COMPLETE (UACMP) WITH MICROSCOPIC - Abnormal; Notable for the following components:   Color, Urine YELLOW (*)    APPearance HAZY (*)    Protein, ur 30 (*)    All other components within normal limits  POC URINE PREG, ED   ____________________________________________  EKG  None ____________________________________________  RADIOLOGY   ____________________________________________   PROCEDURES  Procedure(s) performed: No  Procedures   Critical Care performed: No ____________________________________________   INITIAL IMPRESSION / ASSESSMENT AND PLAN / ED COURSE  Pertinent labs & imaging results that were available during my care of the patient were reviewed by me and considered in my medical decision making (see chart for details).  Patient presents with epigastric discomfort.  Differential includes  gastritis/PUD/pancreatitis/nonspecific abdominal pain  Lab tests overall reassuring, mildly elevated lipase could be suggestive of pancreatitis however her lipase seems to always be mildly elevated.  She did have significant improvement with GI cocktail but in the end did need IV morphine and IV Zofran to completely resolve her pain.  We discussed admission however the patient would like to try to go home on p.o. medications and do a liquid diet, strict return precautions discussed ________________________   FINAL CLINICAL IMPRESSION(S) / ED DIAGNOSES  Final diagnoses:  Periumbilical abdominal pain        Note:  This document was prepared using Dragon voice recognition software and may include unintentional dictation errors.   Jene Every, MD 12/21/18 Nicholos Johns

## 2018-12-29 ENCOUNTER — Ambulatory Visit: Payer: 59 | Admitting: Gastroenterology

## 2019-01-27 ENCOUNTER — Encounter: Payer: Self-pay | Admitting: *Deleted

## 2019-02-09 ENCOUNTER — Ambulatory Visit: Payer: 59 | Admitting: Gastroenterology

## 2019-02-22 ENCOUNTER — Ambulatory Visit (INDEPENDENT_AMBULATORY_CARE_PROVIDER_SITE_OTHER): Payer: 59 | Admitting: Gastroenterology

## 2019-02-22 DIAGNOSIS — R1084 Generalized abdominal pain: Secondary | ICD-10-CM

## 2019-02-22 NOTE — Progress Notes (Signed)
Danielle BouillonVarnita Harrison [address]  [address]  [place], [place] [address]  Main: [phone]  Fax: [phone]   Gastroenterology Consultation  Referring Provider:     [facility] Primary Care Physician:  [facility] Reason for Consultation:     Abdominal pain        HPI:   Virtual Visit via Video Note  I connected with patient on 02/22/19 at 11:00 AM EDT by video ([email or web address]) and verified that I am speaking with the correct person using two identifiers.   I discussed the limitations, risks, security and privacy concerns of performing an evaluation and management service by video and the availability of in person appointments. I also discussed with the patient that there may be a patient responsible charge related to this service. The patient expressed understanding and agreed to proceed.  Location of the patient: Home Location of provider: Home Participating persons: Patient and provider only (Nursing staff checked in patient via phone but were not physically involved in the video interaction - see their notes)   History of Present Illness: Chief Complaint  Patient presents with  . New Patient (Initial Visit)  . Periumbilical Abdominal Pain  . Change in Bowel Habits    Danielle Harrison is a 43 y.o. y/o female referred for consultation & management  by [facility]  Patient went to the ER in March 2020 due to abdominal pain, which was treated conservatively and improved with GI cocktail and patient was discharged from the ER.  Patient states pain has not reoccurred since then.  Has had 2 episodes over the last 6 months of this pain.  Not associated with any nausea or vomiting.  Patient reports good appetite, no weight loss, and no pain with meals.  Lipase was mildly elevated to 62 on the ER visit in March 2020, but has been elevated to 181 in September 2019.  Patient reports previous history of daily alcohol use which she cut down on 4 years  ago.  Now only drinks about 1-2 times a month, 2-3 drinks in a setting.  Is not sure if she has had previous episodes of pancreatitis, but thinks she she may have been told that she had pancreatitis before.  Last abdominal imaging was in September 2019 which was an abdominal ultrasound that was reported to be normal.  Had a CT abdomen in May 2015 that reported mild duodenal wall thickening and no other abnormalities.  Pancreas was specifically reported to be unremarkable.  There is an ultrasound from 2011 in care everywhere that reports "peripancreatic fluid compatible with history of acute pancreatitis.  No evidence of gallstones.".  Patient reports 1 year history of altered bowel habits, with 5-6 bowel movements a day.  States sometimes they are loose, sometimes are formed.  Has intermittently seen fat floating on top.  No blood in stool.  No family history of colon cancer.  No prior endoscopy or colonoscopy.  Past Medical History:  Diagnosis Date  . Asthma     Past Surgical History:  Procedure Laterality Date  . CESAREAN SECTION     x2    Prior to Admission medications   Medication Sig Start Date End Date Taking? Authorizing Provider  DULoxetine (CYMBALTA) 30 MG capsule Take 30 mg by mouth daily. 05/23/15   [provider]    No family history on file.   Social History   Tobacco Use  . Smoking status: Current Every Day Smoker  Packs/day: 0.50    Types: Cigarettes  . Smokeless tobacco: Never Used  Substance Use Topics  . Alcohol use: Yes  . Drug use: Not on file    Allergies as of 02/22/2019  . (No Known Allergies)    Review of Systems:    All systems reviewed and negative except where noted in HPI.   Observations/Objective:  Labs: CBC    Component Value Date/Time   WBC 7.5 12/21/2018 1507   RBC 3.89 12/21/2018 1507   HGB 13.7 12/21/2018 1507   HGB 14.2 03/01/2014 1059   HCT 40.7 12/21/2018 1507   HCT 42.1 03/01/2014 1059   PLT 324 12/21/2018 1507    PLT 324 03/01/2014 1059   MCV 104.6 (H) 12/21/2018 1507   MCV 96 03/01/2014 1059   MCH 35.2 (H) 12/21/2018 1507   MCHC 33.7 12/21/2018 1507   RDW 12.7 12/21/2018 1507   RDW 12.5 03/01/2014 1059   LYMPHSABS 2.0 03/01/2014 1059   MONOABS 0.5 03/01/2014 1059   EOSABS 0.1 03/01/2014 1059   BASOSABS 0.1 03/01/2014 1059   CMP     Component Value Date/Time   NA 138 12/21/2018 1507   NA 134 (L) 03/01/2014 1059   K 4.0 12/21/2018 1507   K 3.5 03/01/2014 1059   CL 105 12/21/2018 1507   CL 104 03/01/2014 1059   CO2 22 12/21/2018 1507   CO2 18 (L) 03/01/2014 1059   GLUCOSE 138 (H) 12/21/2018 1507   GLUCOSE 182 (H) 03/01/2014 1059   BUN 6 12/21/2018 1507   BUN 11 03/01/2014 1059   CREATININE 0.67 12/21/2018 1507   CREATININE 0.70 03/01/2014 1059   CALCIUM 9.2 12/21/2018 1507   CALCIUM 9.2 03/01/2014 1059   PROT 7.6 12/21/2018 1507   PROT 7.4 03/01/2014 1059   ALBUMIN 4.2 12/21/2018 1507   ALBUMIN 4.3 03/01/2014 1059   AST 36 12/21/2018 1507   AST 16 03/01/2014 1059   ALT 27 12/21/2018 1507   ALT 20 03/01/2014 1059   ALKPHOS 85 12/21/2018 1507   ALKPHOS 67 03/01/2014 1059   BILITOT 0.9 12/21/2018 1507   BILITOT 0.5 03/01/2014 1059   GFRNONAA >60 12/21/2018 1507   GFRNONAA >60 03/01/2014 1059   GFRAA >60 12/21/2018 1507   GFRAA >60 03/01/2014 1059    Imaging Studies: No results found.  Assessment and Plan:   Danielle Harrison is a 43 y.o. y/o female has been referred for abdominal pain  Assessment and Plan: Patient's clinical history, including heavy alcohol intake until 4 years ago, intermittent steatorrhea, and a 2011 ultrasound reporting peripancreatic fluid collection is all suspicious for possible underlying chronic pancreatitis.  She is currently completely pain-free since March 2020  However, Danielle need further evaluation to evaluate her pancreas and pancreatic function  We Danielle obtain stool for pancreatic elastase to evaluate for metastatic malabsorption  We  Danielle also obtain CT scan since we have not had imaging of her pancreas in a long time  No clinical symptoms of acute pancreatitis at this time  Patient advised to abstain from smoking and alcohol as these are risk factors for pancreatitis and pancreatic cancer and she verbalized understanding  Liver enzymes were normal on last check in March 2020  No other alarm symptoms present at this time  Follow Up Instructions: Follow-up in 1 to 2 weeks after CT scan to discuss results and further work-up as needed  I discussed the assessment and treatment plan with the patient. The patient was provided an opportunity to ask  questions and all were answered. The patient agreed with the plan and demonstrated an understanding of the instructions.   The patient was advised to call back or seek an in-person evaluation if the symptoms worsen or if the condition fails to improve as anticipated.  I provided 30 minutes of face-to-face time via video software during this encounter.   Pasty Spillers, MD  Speech recognition software was used to dictate the above note.

## 2019-02-22 NOTE — Patient Instructions (Signed)
CT scheduled for 5/15 at the [facility] at 3:30 pm, arrival time 3:15 pm. Nothing to eat 4 hrs prior to procedure, liquids only. To pick up contrast by Thursday at the [facility]. Also given directions to [facility] to pick up containers for stool sample and instructions.

## 2019-02-24 ENCOUNTER — Telehealth: Payer: Self-pay | Admitting: Gastroenterology

## 2019-02-24 NOTE — Telephone Encounter (Signed)
Danielle Harrison from [facility] call to let us know they just added [facility] & patient has a CT scan on Friday that needs to be precerited . I have added the insurance & verified it. Herbert Seta will continue to check [employer] website to see if approved.

## 2019-02-24 NOTE — Telephone Encounter (Signed)
Prior authorization has been submitted.  Waiting on response 

## 2019-02-25 ENCOUNTER — Telehealth: Payer: Self-pay

## 2019-02-25 ENCOUNTER — Other Ambulatory Visit: Payer: Self-pay

## 2019-02-25 NOTE — Telephone Encounter (Signed)
We had to reschedule CT due to precert remains not yet authorized. Rescheduled for 03/05/2019 at 2pm. I was unable to reach pt due to voice mail box full.

## 2019-02-26 ENCOUNTER — Ambulatory Visit: Admission: RE | Admit: 2019-02-26 | Payer: 59 | Source: Ambulatory Visit

## 2019-03-01 NOTE — Telephone Encounter (Signed)
Pt is now scheduled for 03/10/19 as stated. She was not able to go on Friday anyway, she had a cold.

## 2019-03-01 NOTE — Telephone Encounter (Signed)
Pt left vm she states she missed a call from [place] please return her call

## 2019-03-01 NOTE — Telephone Encounter (Signed)
Pt's CT scan has been approved. Valid 02/26/19 - 08/25/19. She is good to go.

## 2019-03-01 NOTE — Telephone Encounter (Signed)
Received a fax from [place] [facility] pt Ct was approved

## 2019-03-01 NOTE — Telephone Encounter (Signed)
Patient returned call to [facility] & has r/s her Ct scan to 03-10-19 @ 4:00pm.

## 2019-03-02 LAB — PANCREATIC ELASTASE, FECAL: Pancreatic Elastase, Fecal: 208 ug Elast./g (ref >200)

## 2019-03-05 ENCOUNTER — Ambulatory Visit: Payer: Medicaid Other

## 2019-03-09 ENCOUNTER — Other Ambulatory Visit: Payer: Self-pay

## 2019-03-09 ENCOUNTER — Telehealth: Payer: Self-pay | Admitting: Gastroenterology

## 2019-03-09 NOTE — Telephone Encounter (Signed)
Unable to leave voice message due to voice mail box not set up to let her know her stool studies were normal.

## 2019-03-09 NOTE — Telephone Encounter (Signed)
-----   Message from Pasty Spillers, MD sent at 03/09/2019  1:45 PM EDT ----- Danielle Harrison please let patient know, her stool studies were normal. CT is schedule for tomorrow.

## 2019-03-09 NOTE — Telephone Encounter (Signed)
Peggy with [employer] called to let us know the order on patient's Ct pancrease abd with & without contrast needs to be changed to CT ABD WITH CONTRAST. THis is scheduled for 03-10-2019. Please call Ct department & ask for Judeth Cornfield if any questions @ [phone].

## 2019-03-10 ENCOUNTER — Other Ambulatory Visit: Payer: Self-pay

## 2019-03-10 ENCOUNTER — Ambulatory Visit
Admission: RE | Admit: 2019-03-10 | Discharge: 2019-03-10 | Disposition: A | Discharge status: Home / Self Care | Payer: 59 | Source: Ambulatory Visit | Attending: Gastroenterology | Admitting: Gastroenterology

## 2019-03-10 DIAGNOSIS — R1084 Generalized abdominal pain: Secondary | ICD-10-CM | POA: Diagnosis not present

## 2019-03-10 MED ORDER — IOHEXOL 300 MG/ML  SOLN
100.0000 mL | Freq: Once | INTRAMUSCULAR | Status: AC | PRN
  Administered 2019-03-10: 16:00:00 100 mL via INTRAVENOUS

## 2019-03-15 ENCOUNTER — Telehealth: Payer: Self-pay

## 2019-03-15 NOTE — Telephone Encounter (Signed)
-----   Message from Pasty Spillers, MD sent at 03/11/2019  1:10 PM EDT ----- Eunice Blase please let patient know, her CT was normal. If she is still having pain I would like to discuss this further with her. Set up Telemed with me in 1-4 weeks

## 2019-03-15 NOTE — Telephone Encounter (Signed)
Voice mail box not set up.

## 2019-03-16 ENCOUNTER — Telehealth: Payer: Self-pay | Admitting: Gastroenterology

## 2019-03-16 NOTE — Telephone Encounter (Signed)
Attempted to call pt to schedule f/u apt  No vm set up

## 2019-03-16 NOTE — Telephone Encounter (Signed)
-----   Message from Fenton Malling, LPN sent at 8/37/2902 12:59 PM EDT ----- Regarding: appt Please schedule pt for a 2 week f/u  after 02/26/19

## 2019-03-23 ENCOUNTER — Other Ambulatory Visit: Payer: Self-pay

## 2019-03-23 ENCOUNTER — Encounter: Payer: Self-pay | Admitting: Gastroenterology

## 2019-03-23 ENCOUNTER — Ambulatory Visit (INDEPENDENT_AMBULATORY_CARE_PROVIDER_SITE_OTHER): Payer: 59 | Admitting: Gastroenterology

## 2019-03-23 VITALS — BP 121/72 | HR 106 | Wt 130.0 lb

## 2019-03-23 DIAGNOSIS — R1013 Epigastric pain: Secondary | ICD-10-CM

## 2019-03-23 NOTE — Progress Notes (Signed)
Danielle BouillonVarnita Peytyn Trine, MD [address]  [address]  [place], [place] [address]  Main: [phone]  Fax: [phone]   Primary Care Physician: [facility]  Chief complaint: Abdominal pain   HPI: Danielle Harrison is a 43 y.o. female here for follow-up of abdominal pain.  Reports midepigastric abdominal pain once every 6 months with severe symptoms. sharp pain, nonradiating, 8/10, leading to ER visits.  Relieved after pain medications, morphine given in the ER.  No nausea vomiting associated with these pain episodes.  However, does report nausea vomiting that occurs 4 to 5 days prior to start of her menses that is unrelated to the upper abdominal pain described above.  Also describes 1 year history of loose stools, 4-5 times a day with no blood.  No prior upper or lower endoscopy.  CT scan recently did not show any acute abnormalities.  Fecal pancreatic elastase was normal.  Reports chronic ibuprofen use, states takes 8 to 12 pills of ibuprofen a day.  This is for back pain.  Reports history of transverse myelitis in the past  Past medical history: Transverse myelitis as per patient  Current Outpatient Medications  Medication Sig Dispense Refill  . DULoxetine (CYMBALTA) 30 MG capsule Take 30 mg by mouth daily.  2   No current facility-administered medications for this visit.     Allergies as of 03/23/2019  . (No Known Allergies)    ROS:  General: Negative for anorexia, weight loss, fever, chills, fatigue, weakness. ENT: Negative for hoarseness, difficulty swallowing , nasal congestion. CV: Negative for chest pain, angina, palpitations, dyspnea on exertion, peripheral edema.  Respiratory: Negative for dyspnea at rest, dyspnea on exertion, cough, sputum, wheezing.  GI: See history of present illness. GU:  Negative for dysuria, hematuria, urinary incontinence, urinary frequency, nocturnal urination.  Endo: Negative for unusual weight change.    Physical  Examination: Vitals:   03/23/19 1619  BP: 121/72  Pulse: (!) 106  Weight: 130 lb (59 kg)   General: Well-nourished, well-developed in no acute distress.  Eyes: No icterus. Conjunctivae pink. Mouth: Oropharyngeal mucosa moist and pink , no lesions erythema or exudate. Neck: Supple, Trachea midline Abdomen: Bowel sounds are normal, nontender, nondistended, no hepatosplenomegaly or masses, no abdominal bruits or hernia , no rebound or guarding.   Extremities: No lower extremity edema. No clubbing or deformities. Neuro: Alert and oriented x 3.  Grossly intact. Skin: Warm and dry, no jaundice.   Psych: Alert and cooperative, normal mood and affect.   Labs: CMP     Component Value Date/Time   NA 138 12/21/2018 1507   NA 134 (L) 03/01/2014 1059   K 4.0 12/21/2018 1507   K 3.5 03/01/2014 1059   CL 105 12/21/2018 1507   CL 104 03/01/2014 1059   CO2 22 12/21/2018 1507   CO2 18 (L) 03/01/2014 1059   GLUCOSE 138 (H) 12/21/2018 1507   GLUCOSE 182 (H) 03/01/2014 1059   BUN 6 12/21/2018 1507   BUN 11 03/01/2014 1059   CREATININE 0.67 12/21/2018 1507   CREATININE 0.70 03/01/2014 1059   CALCIUM 9.2 12/21/2018 1507   CALCIUM 9.2 03/01/2014 1059   PROT 7.6 12/21/2018 1507   PROT 7.4 03/01/2014 1059   ALBUMIN 4.2 12/21/2018 1507   ALBUMIN 4.3 03/01/2014 1059   AST 36 12/21/2018 1507   AST 16 03/01/2014 1059   ALT 27 12/21/2018 1507   ALT 20 03/01/2014 1059   ALKPHOS 85 12/21/2018 1507   ALKPHOS 67 03/01/2014  1059   BILITOT 0.9 12/21/2018 1507   BILITOT 0.5 03/01/2014 1059   GFRNONAA >60 12/21/2018 1507   GFRNONAA >60 03/01/2014 1059   GFRAA >60 12/21/2018 1507   GFRAA >60 03/01/2014 1059   Lab Results  Component Value Date   WBC 7.5 12/21/2018   HGB 13.7 12/21/2018   HCT 40.7 12/21/2018   MCV 104.6 (H) 12/21/2018   PLT 324 12/21/2018    Imaging Studies: Ct Abdomen W Contrast  Result Date: 03/11/2019 CLINICAL DATA:  Generalized abdominal pain. EXAM: CT ABDOMEN WITH  CONTRAST TECHNIQUE: Multidetector CT imaging of the abdomen was performed using the standard protocol following bolus administration of intravenous contrast. CONTRAST:  170mL OMNIPAQUE IOHEXOL 300 MG/ML  SOLN COMPARISON:  03/01/14 FINDINGS: Lower chest: No acute abnormality. Hepatobiliary: No focal liver abnormality is seen. No gallstones, gallbladder wall thickening, or biliary dilatation. Pancreas: Unremarkable. No pancreatic ductal dilatation or surrounding inflammatory changes. Spleen: Normal in size without focal abnormality. Adrenals/Urinary Tract: Normal appearance of the adrenal glands. The kidneys are unremarkable.  No mass or hydronephrosis identified. Stomach/Bowel: Normal appearance of the stomach. The small bowel loops have a normal course and caliber. No dilated loops of bowel identified. Vascular/Lymphatic: No significant vascular findings are present. No enlarged abdominal or pelvic lymph nodes. Other: No free fluid or fluid collections. Musculoskeletal: No acute or significant osseous findings. IMPRESSION: 1. No acute findings within the abdomen or pelvis. No explanation for abdominal pain. Electronically Signed   By: Kerby Moors M.D.   On: 03/11/2019 08:05    Assessment and Plan:   Danielle Harrison is a 43 y.o. y/o female with abdominal pain  Given daily ibuprofen use for years, and intermittent abdominal pain, we discussed upper endoscopy to evaluate for any underlying peptic ulcers or gastritis.  Patient was advised against such heavy NSAID use and risk of underlying gastritis and ulcers with this and she verbalized understanding.  Due to her loose stools we also discussed colonoscopy to evaluate for microscopic colitis and rule out IBD and she is agreeable  I have discussed alternative options, risks & benefits,  which include, but are not limited to, bleeding, infection, perforation,respiratory complication & drug reaction.  The patient agrees with this plan & written consent Danielle  be obtained.    Pancreas appears normal and pancreatic elastase is normal on recent work-up    Dr [name]

## 2019-03-23 NOTE — Progress Notes (Signed)
Patient ID: Danielle Harrison, female   DOB: 1976/01/31, 44 y.o.   MRN: [id number] Pt does not wish to schedule EGD/Colonoscopy (abdominal pain/diarrhea) at this time. She needs to speak with her husband first. She is to call office back, she is also checking on how much this may cost her.

## 2019-03-25 ENCOUNTER — Other Ambulatory Visit: Payer: Self-pay | Admitting: Nurse Practitioner

## 2019-03-25 DIAGNOSIS — Z1231 Encounter for screening mammogram for malignant neoplasm of breast: Secondary | ICD-10-CM

## 2019-05-18 ENCOUNTER — Telehealth: Payer: Self-pay | Admitting: Gastroenterology

## 2019-05-18 NOTE — Telephone Encounter (Signed)
pATIENT CALLED & WOULD LIKE TO SCHEDULE & EGD WITH COLONOSCOPY.

## 2019-06-03 ENCOUNTER — Other Ambulatory Visit: Payer: Self-pay

## 2019-06-03 DIAGNOSIS — R1084 Generalized abdominal pain: Secondary | ICD-10-CM

## 2019-06-03 DIAGNOSIS — R197 Diarrhea, unspecified: Secondary | ICD-10-CM

## 2019-06-03 MED ORDER — NA SULFATE-K SULFATE-MG SULF 17.5-3.13-1.6 GM/177ML PO SOLN: 1.0000 | Freq: Once | ORAL | 0 refills | Status: AC

## 2019-06-03 NOTE — Telephone Encounter (Signed)
Call returned patient has been scheduled for EGD and Colonoscopy (Abdominal pain and diarrhea)  with Dr. Bonna Gains on 06/28/19 at [facility].  Thanks,  Sharyn Lull

## 2019-06-24 ENCOUNTER — Other Ambulatory Visit
Admission: RE | Admit: 2019-06-24 | Discharge: 2019-06-24 | Disposition: A | Discharge status: Home / Self Care | Payer: 59 | Source: Ambulatory Visit | Attending: Gastroenterology | Admitting: Gastroenterology

## 2019-06-24 ENCOUNTER — Other Ambulatory Visit: Payer: Self-pay

## 2019-06-24 DIAGNOSIS — Z20828 Contact with and (suspected) exposure to other viral communicable diseases: Secondary | ICD-10-CM | POA: Insufficient documentation

## 2019-06-24 DIAGNOSIS — Z01812 Encounter for preprocedural laboratory examination: Secondary | ICD-10-CM | POA: Diagnosis not present

## 2019-06-24 LAB — SARS CORONAVIRUS 2 (TAT 6-24 HRS): SARS Coronavirus 2: NEGATIVE

## 2019-06-25 ENCOUNTER — Other Ambulatory Visit: Payer: Self-pay

## 2019-06-25 ENCOUNTER — Encounter: Payer: Self-pay | Admitting: Emergency Medicine

## 2019-06-25 DIAGNOSIS — Z79899 Other long term (current) drug therapy: Secondary | ICD-10-CM | POA: Diagnosis not present

## 2019-06-25 DIAGNOSIS — J45909 Unspecified asthma, uncomplicated: Secondary | ICD-10-CM | POA: Insufficient documentation

## 2019-06-25 DIAGNOSIS — F17210 Nicotine dependence, cigarettes, uncomplicated: Secondary | ICD-10-CM | POA: Insufficient documentation

## 2019-06-25 DIAGNOSIS — R1084 Generalized abdominal pain: Secondary | ICD-10-CM | POA: Diagnosis not present

## 2019-06-25 DIAGNOSIS — E860 Dehydration: Secondary | ICD-10-CM | POA: Diagnosis not present

## 2019-06-25 DIAGNOSIS — R112 Nausea with vomiting, unspecified: Secondary | ICD-10-CM | POA: Diagnosis not present

## 2019-06-25 LAB — POCT PREGNANCY, URINE: Preg Test, Ur: NEGATIVE

## 2019-06-25 LAB — COMPREHENSIVE METABOLIC PANEL
ALT: 16 U/L (ref 0–44)
AST: 18 U/L (ref 15–41)
Albumin: 5.2 g/dL — ABNORMAL HIGH (ref 3.5–5.0)
Alkaline Phosphatase: 70 U/L (ref 38–126)
Anion gap: 18 — ABNORMAL HIGH (ref 5–15)
BUN: 17 mg/dL (ref 6–20)
CO2: 19 mmol/L — ABNORMAL LOW (ref 22–32)
Calcium: 10.4 mg/dL — ABNORMAL HIGH (ref 8.9–10.3)
Chloride: 99 mmol/L (ref 98–111)
Creatinine, Ser: 0.68 mg/dL (ref 0.44–1.00)
GFR calc Af Amer: >60 mL/min (ref >60)
GFR calc non Af Amer: >60 mL/min (ref >60)
Glucose, Bld: 148 mg/dL — ABNORMAL HIGH (ref 70–99)
Potassium: 3.7 mmol/L (ref 3.5–5.1)
Sodium: 136 mmol/L (ref 135–145)
Total Bilirubin: 1.2 mg/dL (ref 0.3–1.2)
Total Protein: 8.8 g/dL — ABNORMAL HIGH (ref 6.5–8.1)

## 2019-06-25 LAB — URINALYSIS, COMPLETE (UACMP) WITH MICROSCOPIC
Bacteria, UA: NONE SEEN
Bilirubin Urine: NEGATIVE
Glucose, UA: NEGATIVE mg/dL
Ketones, ur: 20 mg/dL — AB
Leukocytes,Ua: NEGATIVE
Nitrite: NEGATIVE
Protein, ur: 100 mg/dL — AB
Specific Gravity, Urine: 1.028 (ref 1.005–1.030)
pH: 7 (ref 5.0–8.0)

## 2019-06-25 LAB — CBC
HCT: 40.8 % (ref 36.0–46.0)
Hemoglobin: 14.5 g/dL (ref 12.0–15.0)
MCH: 33.7 pg (ref 26.0–34.0)
MCHC: 35.5 g/dL (ref 30.0–36.0)
MCV: 94.9 fL (ref 80.0–100.0)
Platelets: 423 10*3/uL — ABNORMAL HIGH (ref 150–400)
RBC: 4.3 MIL/uL (ref 3.87–5.11)
RDW: 12.9 % (ref 11.5–15.5)
WBC: 14.4 10*3/uL — ABNORMAL HIGH (ref 4.0–10.5)
nRBC: 0 % (ref 0.0–0.2)

## 2019-06-25 LAB — LIPASE, BLOOD: Lipase: 19 U/L (ref 11–51)

## 2019-06-25 MED ORDER — SODIUM CHLORIDE 0.9% FLUSH: 3.0000 mL | Freq: Once | INTRAVENOUS | Status: DC

## 2019-06-25 MED ORDER — ONDANSETRON HCL 4 MG/2ML IJ SOLN
4.0000 mg | Freq: Once | INTRAMUSCULAR | Status: AC | PRN
  Administered 2019-06-25: 4 mg via INTRAVENOUS
  Filled 2019-06-25: qty 2

## 2019-06-25 NOTE — ED Triage Notes (Signed)
Pt arrives to triage room via [facility] with complaints of N/V and generalized abdominal pain. Pt reports schedule to have a colonoscopy 9/14 has been seeing GI for possible pancreatitis. Pt reports last episode of emesis prior to arrival at home. Pt talks in complete sentences no respiratory distress noted

## 2019-06-25 NOTE — Anesthesia Preprocedure Evaluation (Addendum)
Anesthesia Evaluation  Patient identified by MRN, date of birth, ID band Patient awake    Reviewed: Allergy & Precautions, NPO status , Patient's Chart, lab work & pertinent test results  History of Anesthesia Complications Negative for: history of anesthetic complications  Airway Mallampati: II  TM Distance: >3 FB Neck ROM: Full    Dental  (+)    Pulmonary asthma , Current Smoker,    breath sounds clear to auscultation       Cardiovascular (-) angina(-) DOE  Rhythm:Regular Rate:Normal     Neuro/Psych PSYCHIATRIC DISORDERS Anxiety  Neuromuscular disease (Transverse myelitis)    GI/Hepatic PUD, neg GERD  ,  Endo/Other    Renal/GU      Musculoskeletal  (+) Arthritis ,   Abdominal   Peds  Hematology   Anesthesia Other Findings   Reproductive/Obstetrics                            Anesthesia Physical Anesthesia Plan  ASA: III  Anesthesia Plan: General   Post-op Pain Management:    Induction: Intravenous  PONV Risk Score and Plan: 2 and Propofol infusion and TIVA  Airway Management Planned: Natural Airway and Nasal Cannula  Additional Equipment:   Intra-op Plan:   Post-operative Plan:   Informed Consent: I have reviewed the patients History and Physical, chart, labs and discussed the procedure including the risks, benefits and alternatives for the proposed anesthesia with the patient or authorized representative who has indicated his/her understanding and acceptance.       Plan Discussed with: CRNA and Anesthesiologist  Anesthesia Plan Comments:         Anesthesia Quick Evaluation

## 2019-06-25 NOTE — Discharge Instructions (Signed)

## 2019-06-26 ENCOUNTER — Emergency Department: Payer: 59

## 2019-06-26 ENCOUNTER — Emergency Department
Admission: EM | Admit: 2019-06-26 | Discharge: 2019-06-26 | Disposition: A | Discharge status: Home / Self Care | Payer: 59 | Attending: Emergency Medicine | Admitting: Emergency Medicine

## 2019-06-26 DIAGNOSIS — R1084 Generalized abdominal pain: Secondary | ICD-10-CM

## 2019-06-26 DIAGNOSIS — E860 Dehydration: Secondary | ICD-10-CM

## 2019-06-26 LAB — BETA-HYDROXYBUTYRIC ACID: Beta-Hydroxybutyric Acid: 0.95 mmol/L — ABNORMAL HIGH (ref 0.05–0.27)

## 2019-06-26 MED ORDER — FENTANYL CITRATE (PF) 100 MCG/2ML IJ SOLN
50.0000 ug | Freq: Once | INTRAMUSCULAR | Status: AC
  Administered 2019-06-26: 50 ug via INTRAVENOUS
  Filled 2019-06-26: qty 2

## 2019-06-26 MED ORDER — DEXTROSE-NACL 5-0.45 % IV SOLN
INTRAVENOUS | Status: DC
  Administered 2019-06-26: 01:00:00 via INTRAVENOUS

## 2019-06-26 MED ORDER — PROMETHAZINE HCL 25 MG/ML IJ SOLN
12.5000 mg | Freq: Once | INTRAMUSCULAR | Status: AC
  Administered 2019-06-26: 12.5 mg via INTRAVENOUS
  Filled 2019-06-26: qty 1

## 2019-06-26 MED ORDER — IOHEXOL 300 MG/ML  SOLN
100.0000 mL | Freq: Once | INTRAMUSCULAR | Status: AC | PRN
  Administered 2019-06-26: 03:00:00 100 mL via INTRAVENOUS

## 2019-06-26 MED ORDER — OXYCODONE-ACETAMINOPHEN 5-325 MG PO TABS: 1.0000 | ORAL_TABLET | ORAL | 0 refills | Status: DC | PRN

## 2019-06-26 MED ORDER — FAMOTIDINE IN NACL 20-0.9 MG/50ML-% IV SOLN
20.0000 mg | Freq: Once | INTRAVENOUS | Status: AC
  Administered 2019-06-26: 20 mg via INTRAVENOUS
  Filled 2019-06-26: qty 50

## 2019-06-26 MED ORDER — ONDANSETRON HCL 4 MG/2ML IJ SOLN
4.0000 mg | Freq: Once | INTRAMUSCULAR | Status: AC
  Administered 2019-06-26: 01:00:00 4 mg via INTRAVENOUS
  Filled 2019-06-26: qty 2

## 2019-06-26 MED ORDER — OXYCODONE-ACETAMINOPHEN 5-325 MG PO TABS
1.0000 | ORAL_TABLET | Freq: Once | ORAL | Status: AC
  Administered 2019-06-26: 1 via ORAL
  Filled 2019-06-26: qty 1

## 2019-06-26 MED ORDER — IOHEXOL 9 MG/ML PO SOLN
500.0000 mL | ORAL | Status: AC
  Administered 2019-06-26: 01:00:00 500 mL via ORAL

## 2019-06-26 NOTE — ED Notes (Signed)
Patient transported to CT 

## 2019-06-26 NOTE — ED Notes (Signed)
Pt educated on need to recheck bp in the morning per EDP. Pt is not on bp meds and states she has a follow up with her primary in a few days and will inform them then of what occurred here in the ED.

## 2019-06-26 NOTE — Discharge Instructions (Addendum)
You may take Percocet every 8 hours as needed for pain.  Follow the preprocedure instructions per your GI doctor.  Return to the ER for worsening symptoms, persistent vomiting, difficulty breathing or other concerns.

## 2019-06-26 NOTE — ED Notes (Signed)
Call bell light answered; pt finished with contrast, CT notified by sec

## 2019-06-26 NOTE — ED Provider Notes (Signed)
[facility] Emergency Department Provider Note   ____________________________________________   First MD Initiated Contact with Patient 06/26/19 0040     (approximate)  I have reviewed the triage vital signs and the nursing notes.   HISTORY  Chief Complaint Nausea, Emesis, and Abdominal Pain    HPI Danielle Harrison is a 43 y.o. female brought to the ED from home via EMS with a chief complaint of generalized abdominal pain, nausea and vomiting.  Patient reports several months of abdominal pain, nausea, vomiting and diarrhea.  She has seen Dr. Dutch Gray from GI and is scheduled to have EGD and colonoscopy in 2 days.  States pain was so severe tonight that she could not bear it.  Denies fever, cough, chest pain, shortness of breath, dysuria.  Denies recent antibiotic use, travel or trauma.       Past Medical History:  Diagnosis Date  . Anxiety   . Arthritis    back  . Asthma   . Neuromuscular disorder (HCC)    weakness and numbness   . Restless leg syndrome     There are no active problems to display for this patient.   Past Surgical History:  Procedure Laterality Date  . CESAREAN SECTION     x2    Prior to Admission medications   Medication Sig Start Date End Date Taking? Authorizing Provider  baclofen (LIORESAL) 20 MG tablet Take 20 mg by mouth at bedtime.    [provider]  DULoxetine (CYMBALTA) 30 MG capsule Take 30 mg by mouth daily. 05/23/15   [provider]  hydrOXYzine (VISTARIL) 25 MG capsule Take 25 mg by mouth at bedtime.    [provider]  oxyCODONE-acetaminophen (PERCOCET/ROXICET) 5-325 MG tablet Take 1 tablet by mouth every 4 (four) hours as needed for severe pain. 06/26/19   Irean Hong, MD  pramipexole (MIRAPEX) 0.25 MG tablet Take 0.25 mg by mouth at bedtime.    [provider]    Allergies Patient has no known allergies.  Family History  Problem Relation Age of Onset  . Colon cancer  Neg Hx     Social History Social History   Tobacco Use  . Smoking status: Current Every Day Smoker    Packs/day: 0.50    Years: 6.00    Pack years: 3.00    Types: Cigarettes  . Smokeless tobacco: Never Used  Substance Use Topics  . Alcohol use: Yes    Comment: occasionally  . Drug use: Not on file    Review of Systems  Constitutional: No fever/chills Eyes: No visual changes. ENT: No sore throat. Cardiovascular: Denies chest pain. Respiratory: Denies shortness of breath. Gastrointestinal: Positive for abdominal pain, nausea, vomiting and diarrhea.  No constipation. Genitourinary: Negative for dysuria. Musculoskeletal: Negative for back pain. Skin: Negative for rash. Neurological: Negative for headaches, focal weakness or numbness.   ____________________________________________   PHYSICAL EXAM:  VITAL SIGNS: ED Triage Vitals  Enc Vitals Group     BP 06/25/19 2021 (!) 166/144     Pulse Rate 06/25/19 2021 (!) 108     Resp 06/25/19 2021 (!) 22     Temp 06/25/19 2021 98.2 F (36.8 C)     Temp Source 06/25/19 2021 Oral     SpO2 06/25/19 2021 99 %     Weight 06/25/19 2022 133 lb (60.3 kg)     Height 06/25/19 2022 5' (1.524 m)     Head Circumference --      Peak  Flow --      Pain Score 06/25/19 2022 10     Pain Loc --      Pain Edu? --      Excl. in GC? --     Constitutional: Alert and oriented. Well appearing and in mild acute distress. Eyes: Conjunctivae are normal. PERRL. EOMI. Head: Atraumatic. Nose: No congestion/rhinnorhea. Mouth/Throat: Mucous membranes are moist.  Oropharynx non-erythematous. Neck: No stridor.   Cardiovascular: Normal rate, regular rhythm. Grossly normal heart sounds.  Good peripheral circulation. Respiratory: Normal respiratory effort.  No retractions. Lungs CTAB. Gastrointestinal: Soft with mild diffuse tenderness to palpation without rebound or guarding. No distention. No abdominal bruits. No CVA tenderness. Musculoskeletal: No  lower extremity tenderness nor edema.  No joint effusions. Neurologic:  Normal speech and language. No gross focal neurologic deficits are appreciated. No gait instability. Skin:  Skin is warm, dry and intact. No rash noted. Psychiatric: Mood and affect are normal. Speech and behavior are normal.  ____________________________________________   LABS (all labs ordered are listed, but only abnormal results are displayed)  Labs Reviewed  COMPREHENSIVE METABOLIC PANEL - Abnormal; Notable for the following components:      Result Value   CO2 19 (*)    Glucose, Bld 148 (*)    Calcium 10.4 (*)    Total Protein 8.8 (*)    Albumin 5.2 (*)    Anion gap 18 (*)    All other components within normal limits  CBC - Abnormal; Notable for the following components:   WBC 14.4 (*)    Platelets 423 (*)    All other components within normal limits  URINALYSIS, COMPLETE (UACMP) WITH MICROSCOPIC - Abnormal; Notable for the following components:   Color, Urine YELLOW (*)    APPearance HAZY (*)    Hgb urine dipstick SMALL (*)    Ketones, ur 20 (*)    Protein, ur 100 (*)    All other components within normal limits  BETA-HYDROXYBUTYRIC ACID - Abnormal; Notable for the following components:   Beta-Hydroxybutyric Acid 0.95 (*)    All other components within normal limits  LIPASE, BLOOD  POCT PREGNANCY, URINE  POC URINE PREG, ED   ____________________________________________  EKG  ED ECG REPORT I, SUNG,JADE J, the attending physician, personally viewed and interpreted this ECG.   Date: 06/26/2019  EKG Time: 2033  Rate: 123  Rhythm: sinus tachycardia  Axis: Normal  Intervals:none  ST&T Change: Nonspecific  ____________________________________________  RADIOLOGY  ED MD interpretation: Unremarkable CT  Official radiology report(s): Ct Abdomen Pelvis W Contrast  Result Date: 06/26/2019 CLINICAL DATA:  Abdominal pain with nausea and vomiting EXAM: CT ABDOMEN AND PELVIS WITH CONTRAST  TECHNIQUE: Multidetector CT imaging of the abdomen and pelvis was performed using the standard protocol following bolus administration of intravenous contrast. CONTRAST:  100mL OMNIPAQUE IOHEXOL 300 MG/ML  SOLN COMPARISON:  CT abdomen 03/10/2019 FINDINGS: LOWER CHEST: There is no basilar pleural or apical pericardial effusion. HEPATOBILIARY: The hepatic contours and density are normal. There is no intra- or extrahepatic biliary dilatation. The gallbladder is normal. PANCREAS: The pancreatic parenchymal contours are normal and there is no ductal dilatation. There is no peripancreatic fluid collection. SPLEEN: Normal. ADRENALS/URINARY TRACT: --Adrenal glands: Normal. --Right kidney/ureter: No hydronephrosis, nephroureterolithiasis, perinephric stranding or solid renal mass. --Left kidney/ureter: No hydronephrosis, nephroureterolithiasis, perinephric stranding or solid renal mass. --Urinary bladder: Normal for degree of distention STOMACH/BOWEL: --Stomach/Duodenum: There is no hiatal hernia or other gastric abnormality. The duodenal course and caliber are normal. --Small bowel:  No dilatation or inflammation. --Colon: No focal abnormality. --Appendix: Normal. VASCULAR/LYMPHATIC: Normal course and caliber of the major abdominal vessels. No abdominal or pelvic lymphadenopathy. REPRODUCTIVE: Normal uterus and ovaries. MUSCULOSKELETAL. No bony spinal canal stenosis or focal osseous abnormality. OTHER: None. IMPRESSION: No acute abdominal or pelvic abnormality. Electronically Signed   By: Ulyses Jarred M.D.   On: 06/26/2019 03:03    ____________________________________________   PROCEDURES  Procedure(s) performed (including Critical Care):  Procedures   ____________________________________________   INITIAL IMPRESSION / ASSESSMENT AND PLAN / ED COURSE  As part of my medical decision making, I reviewed the following data within the [facility] notes reviewed and incorporated, Labs  reviewed, Old chart reviewed, Radiograph reviewed and Notes from prior ED visits     Danielle Harrison was evaluated in Emergency Department on 06/26/2019 for the symptoms described in the history of present illness. She was evaluated in the context of the global COVID-19 pandemic, which necessitated consideration that the patient might be at risk for infection with the SARS-CoV-2 virus that causes COVID-19. Institutional protocols and algorithms that pertain to the evaluation of patients at risk for COVID-19 are in a state of rapid change based on information released by regulatory bodies including the CDC and federal and state organizations. These policies and algorithms were followed during the patient's care in the ED.    43 year old female who presents with abdominal pain, nausea and vomiting. Differential diagnosis includes, but is not limited to, ovarian cyst, ovarian torsion, acute appendicitis, diverticulitis, urinary tract infection/pyelonephritis, endometriosis, bowel obstruction, colitis, renal colic, gastroenteritis, hernia, fibroids, endometriosis, pregnancy related pain including ectopic pregnancy, etc.  Laboratory results demonstrate moderate leukocytosis, minimal anion gap elevation which is likely secondary to ketonuria; glucose 148. Patient is not a diabetic but will check beta hydroxybutyrate.  Clinical Course as of Jun 25 457  Sat Jun 26, 2019  0349 Patient resting in no acute distress.  Will discharge after IV fluids completed.  Blood pressure improved.  Feel mildly elevated anion gap secondary to u metabolic dysfunction secondary to dehydration.  Upddated her on CT results.  She is to keep her appointment for GI procedures in 2 days.  Return precautions given.  Patient verbalizes understanding agrees with plan of care.   [JS]    Clinical Course User Index [JS] Paulette Blanch, MD     ____________________________________________   FINAL CLINICAL IMPRESSION(S) / ED DIAGNOSES   Final diagnoses:  Generalized abdominal pain  Dehydration     ED Discharge Orders         Ordered    oxyCODONE-acetaminophen (PERCOCET/ROXICET) 5-325 MG tablet  Every 4 hours PRN     06/26/19 0432           Note:  This document was prepared using Dragon voice recognition software and may include unintentional dictation errors.   Paulette Blanch, MD 06/26/19 [phone]

## 2019-06-26 NOTE — ED Notes (Signed)
Pt requesting water. This RN advised pt that EDP would have to evaluate pt first before she can have anything PO. Pt cooperative.

## 2019-06-28 ENCOUNTER — Ambulatory Visit: Payer: 59 | Admitting: Anesthesiology

## 2019-06-28 ENCOUNTER — Encounter
Admission: RE | Disposition: A | Discharge status: Home / Self Care | Payer: Self-pay | Source: Home / Self Care | Attending: Gastroenterology

## 2019-06-28 ENCOUNTER — Ambulatory Visit
Admission: RE | Admit: 2019-06-28 | Discharge: 2019-06-28 | Disposition: A | Discharge status: Home / Self Care | Payer: 59 | Attending: Gastroenterology | Admitting: Gastroenterology

## 2019-06-28 ENCOUNTER — Other Ambulatory Visit: Payer: Self-pay

## 2019-06-28 DIAGNOSIS — F419 Anxiety disorder, unspecified: Secondary | ICD-10-CM | POA: Diagnosis not present

## 2019-06-28 DIAGNOSIS — K2950 Unspecified chronic gastritis without bleeding: Secondary | ICD-10-CM | POA: Diagnosis not present

## 2019-06-28 DIAGNOSIS — Z79899 Other long term (current) drug therapy: Secondary | ICD-10-CM | POA: Diagnosis not present

## 2019-06-28 DIAGNOSIS — R109 Unspecified abdominal pain: Secondary | ICD-10-CM

## 2019-06-28 DIAGNOSIS — M1990 Unspecified osteoarthritis, unspecified site: Secondary | ICD-10-CM | POA: Diagnosis not present

## 2019-06-28 DIAGNOSIS — R197 Diarrhea, unspecified: Secondary | ICD-10-CM

## 2019-06-28 DIAGNOSIS — K3189 Other diseases of stomach and duodenum: Secondary | ICD-10-CM

## 2019-06-28 DIAGNOSIS — J45909 Unspecified asthma, uncomplicated: Secondary | ICD-10-CM | POA: Diagnosis not present

## 2019-06-28 DIAGNOSIS — F17210 Nicotine dependence, cigarettes, uncomplicated: Secondary | ICD-10-CM | POA: Insufficient documentation

## 2019-06-28 HISTORY — PX: COLONOSCOPY WITH PROPOFOL: SHX5780

## 2019-06-28 HISTORY — DX: Anxiety disorder, unspecified: F41.9

## 2019-06-28 HISTORY — DX: Unspecified osteoarthritis, unspecified site: M19.90

## 2019-06-28 HISTORY — PX: BIOPSY: SHX5522

## 2019-06-28 HISTORY — DX: Restless legs syndrome: G25.81

## 2019-06-28 HISTORY — PX: ESOPHAGOGASTRODUODENOSCOPY (EGD) WITH PROPOFOL: SHX5813

## 2019-06-28 HISTORY — DX: Myoneural disorder, unspecified: G70.9

## 2019-06-28 LAB — POCT PREGNANCY, URINE: Preg Test, Ur: NEGATIVE

## 2019-06-28 SURGERY — COLONOSCOPY WITH PROPOFOL
Anesthesia: General | Site: Rectum

## 2019-06-28 MED ORDER — PROPOFOL 10 MG/ML IV BOLUS
INTRAVENOUS | Status: DC | PRN
  Administered 2019-06-28 (×2): 40 mg via INTRAVENOUS
  Administered 2019-06-28: 150 mg via INTRAVENOUS
  Administered 2019-06-28: 20 mg via INTRAVENOUS
  Administered 2019-06-28: 50 mg via INTRAVENOUS
  Administered 2019-06-28: 30 mg via INTRAVENOUS
  Administered 2019-06-28: 40 mg via INTRAVENOUS
  Administered 2019-06-28: 50 mg via INTRAVENOUS
  Administered 2019-06-28 (×5): 40 mg via INTRAVENOUS
  Administered 2019-06-28: 50 mg via INTRAVENOUS
  Administered 2019-06-28 (×5): 40 mg via INTRAVENOUS
  Administered 2019-06-28: 20 mg via INTRAVENOUS
  Administered 2019-06-28 (×2): 40 mg via INTRAVENOUS

## 2019-06-28 MED ORDER — STERILE WATER FOR IRRIGATION IR SOLN
Status: DC | PRN
  Administered 2019-06-28: 14:00:00 250 mL

## 2019-06-28 MED ORDER — LACTATED RINGERS IV SOLN
100.0000 mL/h | INTRAVENOUS | Status: DC
  Administered 2019-06-28: 100 mL/h via INTRAVENOUS

## 2019-06-28 MED ORDER — LIDOCAINE HCL (CARDIAC) PF 100 MG/5ML IV SOSY
PREFILLED_SYRINGE | INTRAVENOUS | Status: DC | PRN
  Administered 2019-06-28: 30 mg via INTRAVENOUS

## 2019-06-28 MED ORDER — GLYCOPYRROLATE 0.2 MG/ML IJ SOLN
INTRAMUSCULAR | Status: DC | PRN
  Administered 2019-06-28: 0.1 mg via INTRAVENOUS

## 2019-06-28 SURGICAL SUPPLY — 36 items
BALLN DILATOR 10-12 8 (BALLOONS)
BALLN DILATOR 12-15 8 (BALLOONS)
BALLN DILATOR 15-18 8 (BALLOONS)
BALLN DILATOR CRE 0-12 8 (BALLOONS)
BALLN DILATOR ESOPH 8 10 CRE (MISCELLANEOUS) IMPLANT
BALLOON DILATOR 12-15 8 (BALLOONS) IMPLANT
BALLOON DILATOR 15-18 8 (BALLOONS) IMPLANT
BALLOON DILATOR CRE 0-12 8 (BALLOONS) IMPLANT
BLOCK BITE 60FR ADLT L/F GRN (MISCELLANEOUS) ×5 IMPLANT
CANISTER SUCT 1200ML W/VALVE (MISCELLANEOUS) ×5 IMPLANT
CLIP HMST 235XBRD CATH ROT (MISCELLANEOUS) IMPLANT
CLIP RESOLUTION 360 11X235 (MISCELLANEOUS)
ELECT REM PT RETURN 9FT ADLT (ELECTROSURGICAL)
ELECTRODE REM PT RTRN 9FT ADLT (ELECTROSURGICAL) IMPLANT
FCP ESCP3.2XJMB 240X2.8X (MISCELLANEOUS)
FORCEPS BIOP RAD 4 LRG CAP 4 (CUTTING FORCEPS) ×5 IMPLANT
FORCEPS BIOP RJ4 240 W/NDL (MISCELLANEOUS)
FORCEPS ESCP3.2XJMB 240X2.8X (MISCELLANEOUS) IMPLANT
GOWN CVR UNV OPN BCK APRN NK (MISCELLANEOUS) ×6 IMPLANT
GOWN ISOL THUMB LOOP REG UNIV (MISCELLANEOUS) ×4
INJECTOR VARIJECT VIN23 (MISCELLANEOUS) IMPLANT
KIT DEFENDO VALVE AND CONN (KITS) IMPLANT
KIT ENDO PROCEDURE OLY (KITS) ×5 IMPLANT
MARKER SPOT ENDO TATTOO 5ML (MISCELLANEOUS) IMPLANT
PROBE APC STR FIRE (PROBE) IMPLANT
RETRIEVER NET PLAT FOOD (MISCELLANEOUS) IMPLANT
RETRIEVER NET ROTH 2.5X230 LF (MISCELLANEOUS) IMPLANT
SNARE SHORT THROW 13M SML OVAL (MISCELLANEOUS) IMPLANT
SNARE SHORT THROW 30M LRG OVAL (MISCELLANEOUS) IMPLANT
SNARE SNG USE RND 15MM (INSTRUMENTS) IMPLANT
SPOT EX ENDOSCOPIC TATTOO (MISCELLANEOUS)
SYR INFLATION 60ML (SYRINGE) IMPLANT
TRAP ETRAP POLY (MISCELLANEOUS) IMPLANT
VARIJECT INJECTOR VIN23 (MISCELLANEOUS)
WATER STERILE IRR 250ML POUR (IV SOLUTION) ×5 IMPLANT
WIRE CRE 18-20MM 8CM F G (MISCELLANEOUS) IMPLANT

## 2019-06-28 NOTE — Op Note (Signed)
[facility]enterology Patient Name: Danielle Harrison Procedure Date: 06/28/2019 12:31 PM MRN: [id number] Account #: [id number] Date of Birth: March 26, 1976 Admit Type: Outpatient Age: 43 Room: [facility] OR ROOM 01 Gender: Female Note Status: Finalized Procedure:            Colonoscopy Indications:          Abdominal pain, Diarrhea Providers:            Makenzi Bannister B. Bonna Gains MD, MD Medicines:            Monitored Anesthesia Care Complications:        No immediate complications. Procedure:            Pre-Anesthesia Assessment:                       - Prior to the procedure, a History and Physical was                        performed, and patient medications, allergies and                        sensitivities were reviewed. The patient's tolerance of                        previous anesthesia was reviewed.                       - The risks and benefits of the procedure and the                        sedation options and risks were discussed with the                        patient. All questions were answered and informed                        consent was obtained.                       - Patient identification and proposed procedure were                        verified prior to the procedure by the physician, the                        nurse, the anesthetist and the technician. The                        procedure was verified in the pre-procedure area in the                        procedure room in the endoscopy suite.                       - ASA Grade Assessment: II - A patient with mild                        systemic disease.                       - After reviewing the risks and benefits, the patient  was deemed in satisfactory condition to undergo the                        procedure.                       After obtaining informed consent, the colonoscope was                        passed under direct vision. Throughout the procedure,                  the patient's blood pressure, pulse, and oxygen                        saturations were monitored continuously. The was                        introduced through the anus and advanced to the the                        terminal ileum. The colonoscopy was performed with                        ease. The patient tolerated the procedure well. The                        quality of the bowel preparation was good. Findings:      The perianal and digital rectal examinations were normal.      The rectum, sigmoid colon, descending colon, transverse colon, ascending       colon, cecum and ileum appeared normal. Biopsies for histology were       taken with a cold forceps from the entire colon for evaluation of       microscopic colitis.      The retroflexed view of the distal rectum and anal verge was normal and       showed no anal or rectal abnormalities. Impression:           - The rectum, sigmoid colon, descending colon,                        transverse colon, ascending colon, cecum and terminal                        ileum are normal. Biopsied.                       - The distal rectum and anal verge are normal on                        retroflexion view. Recommendation:       - Discharge patient to home.                       - Resume previous diet.                       - Continue present medications.                       - Repeat colonoscopy in 10 years for screening purposes.                       -  Return to primary care physician as previously                        scheduled.                       - The findings and recommendations were discussed with                        the patient.                       - The findings and recommendations were discussed with                        the patient's family.                       - In the future, if patient develops new symptoms such                        as blood per rectum, abdominal pain, weight loss,                         altered bowel habits or any other reason for concern,                        patient should discuss this with thier PCP as they may                        need a GI referral at that time or evaluation for need                        for colonoscopy earlier than the recommended screening                        colonoscopy.                       In addition, if patient's family history of colon                        cancer changes (no family history at this time) in the                        future, earlier screening may be indicated and patient                        should discuss this with PCP as well. Procedure Code(s):    --- Professional ---                       [phone], Colonoscopy, flexible; with biopsy, single or                        multiple Diagnosis Code(s):    --- Professional ---                       R10.9, Unspecified abdominal pain  R19.7, Diarrhea, unspecified CPT copyright 2019 American Medical Association. All rights reserved. The codes documented in this report are preliminary and upon coder review may  be revised to meet current compliance requirements.  [name], MD Michel Bickers B. Maximino Greenland MD, MD 06/28/2019 1:44:39 PM This report has been signed electronically. Number of Addenda: 0 Note Initiated On: 06/28/2019 12:31 PM Scope Withdrawal Time: 0 hours 18 minutes 38 seconds  Total Procedure Duration: 0 hours 23 minutes 52 seconds  Estimated Blood Loss: Estimated blood loss: none.      [facility]

## 2019-06-28 NOTE — Op Note (Signed)
[facility]astroenterology Patient Name: Danielle Harrison Procedure Date: 06/28/2019 12:35 PM MRN: [id number] Account #: [id number] Date of Birth: 01/17/1976 Admit Type: Outpatient Age: 43 Room: [facility] OR ROOM 01 Gender: Female Note Status: Finalized Procedure:            Upper GI endoscopy Indications:          Abdominal pain, Diarrhea Providers:            Charleene Callegari B. Bonna Gains MD, MD Referring MD:         Perrin Maltese, MD (Referring MD) Medicines:            Monitored Anesthesia Care Complications:        No immediate complications. Procedure:            Pre-Anesthesia Assessment:                       - Prior to the procedure, a History and Physical was                        performed, and patient medications, allergies and                        sensitivities were reviewed. The patient's tolerance of                        previous anesthesia was reviewed.                       - The risks and benefits of the procedure and the                        sedation options and risks were discussed with the                        patient. All questions were answered and informed                        consent was obtained.                       - Patient identification and proposed procedure were                        verified prior to the procedure by the physician, the                        nurse, the anesthesiologist, the anesthetist and the                        technician. The procedure was verified in the procedure                        room.                       - ASA Grade Assessment: II - A patient with mild                        systemic disease.  After obtaining informed consent, the endoscope was                        passed under direct vision. Throughout the procedure,                        the patient's blood pressure, pulse, and oxygen                        saturations were monitored continuously. The was          introduced through the mouth, and advanced to the                        second part of duodenum. The upper GI endoscopy was                        accomplished with ease. The patient tolerated the                        procedure well. Findings:      The examined esophagus was normal.      Patchy mildly erythematous mucosa without bleeding was found in the       gastric fundus. Biopsies were taken with a cold forceps for histology.      The entire examined stomach was normal. Biopsies were obtained in the       gastric body, at the incisura and in the gastric antrum with cold       forceps for histology. Biopsies were taken with a cold forceps for       Helicobacter pylori testing.      The duodenal bulb, second portion of the duodenum and examined duodenum       were normal. Biopsies for histology were taken with a cold forceps for       evaluation of celiac disease. Impression:           - Normal esophagus.                       - Erythematous mucosa in the gastric fundus. Biopsied.                       - Normal stomach. Biopsied.                       - Normal duodenal bulb, second portion of the duodenum                        and examined duodenum. Biopsied.                       - Biopsies were obtained in the gastric body, at the                        incisura and in the gastric antrum. Recommendation:       - Await pathology results.                       - Discharge patient to home (with escort).                       - Advance  diet as tolerated.                       - Continue present medications.                       - Patient has a contact number available for                        emergencies. The signs and symptoms of potential                        delayed complications were discussed with the patient.                        Return to normal activities tomorrow. Written discharge                        instructions were provided to the patient.                        - Discharge patient to home (with escort).                       - The findings and recommendations were discussed with                        the patient.                       - The findings and recommendations were discussed with                        the patient's family. Procedure Code(s):    --- Professional ---                       [phone] [id number], Esophagogastroduodenoscopy, flexible, transoral;                        with biopsy, single or multiple Diagnosis Code(s):    --- Professional ---                       K31.89, Other diseases of stomach and duodenum                       R10.9, Unspecified abdominal pain CPT copyright 2019 American Medical Association. All rights reserved. The codes documented in this report are preliminary and upon coder review may  be revised to meet current compliance requirements.  Melodie BouillonVarnita Yareni Creps, MD Michel BickersVarnita B. Maximino Greenlandahiliani MD, MD 06/28/2019 1:16:59 PM This report has been signed electronically. Number of Addenda: 0 Note Initiated On: 06/28/2019 12:35 PM Total Procedure Duration: 0 hours 8 minutes 41 seconds  Estimated Blood Loss: Estimated blood loss: none.      [facility]

## 2019-06-28 NOTE — H&P (Signed)
[name], MD [address], [place], [place], [place], [address] [address], [place], [place], [place], [address] Phone: [phone]  Fax: [phone]  Primary Care Physician:  [facility]   Pre-Procedure History & Physical: HPI:  Danielle Harrison is a 43 y.o. female is here for a colonoscopy and EGD.   Past Medical History:  Diagnosis Date  . Anxiety   . Arthritis    back  . Asthma   . Neuromuscular disorder (HCC)    weakness and numbness   . Restless leg syndrome     Past Surgical History:  Procedure Laterality Date  . CESAREAN SECTION     x2    Prior to Admission medications   Medication Sig Start Date End Date Taking? Authorizing Provider  baclofen (LIORESAL) 20 MG tablet Take 20 mg by mouth at bedtime.   Yes [provider]  DULoxetine (CYMBALTA) 30 MG capsule Take 30 mg by mouth daily. 05/23/15  Yes [provider]  hydrOXYzine (VISTARIL) 25 MG capsule Take 25 mg by mouth at bedtime.   Yes [provider]  oxyCODONE-acetaminophen (PERCOCET/ROXICET) 5-325 MG tablet Take 1 tablet by mouth every 4 (four) hours as needed for severe pain. 06/26/19  Yes Paulette Blanch, MD  pramipexole (MIRAPEX) 0.25 MG tablet Take 0.25 mg by mouth at bedtime.   Yes [provider]    Allergies as of 06/03/2019  . (No Known Allergies)    Family History  Problem Relation Age of Onset  . Colon cancer Neg Hx     Social History   Socioeconomic History  . Marital status: Married    Spouse name: Not on file  . Number of children: Not on file  . Years of education: Not on file  . Highest education level: Not on file  Occupational History  . Not on file  Social Needs  . Financial resource strain: Not on file  . Food insecurity    Worry: Not on file    Inability: Not on file  . Transportation needs    Medical: Not on file    Non-medical: Not on file  Tobacco Use  . Smoking status: Current Every Day Smoker    Packs/day:  0.50    Years: 6.00    Pack years: 3.00    Types: Cigarettes  . Smokeless tobacco: Never Used  Substance and Sexual Activity  . Alcohol use: Yes    Comment: occasionally  . Drug use: Not on file  . Sexual activity: Not on file  Lifestyle  . Physical activity    Days per week: Not on file    Minutes per session: Not on file  . Stress: Not on file  Relationships  . Social Herbalist on phone: Not on file    Gets together: Not on file    Attends religious service: Not on file    Active member of club or organization: Not on file    Attends meetings of clubs or organizations: Not on file    Relationship status: Not on file  . Intimate partner violence    Fear of current or ex partner: Not on file    Emotionally abused: Not on file    Physically abused: Not on file    Forced sexual activity: Not on file  Other Topics Concern  . Not on file  Social History Narrative  . Not on file    Review of Systems: See HPI, otherwise negative ROS  Physical Exam: BP 115/86   Pulse 95   Temp 97.9 F (36.6 C) (Temporal)   Resp 18   Ht 5' (1.524 m)   Wt 59 kg   LMP 06/22/2019 (Approximate)   SpO2 100%   BMI 25.39 kg/m  General:   Alert,  pleasant and cooperative in NAD Head:  Normocephalic and atraumatic. Neck:  Supple; no masses or thyromegaly. Lungs:  Clear throughout to auscultation, normal respiratory effort.    Heart:  +S1, +S2, Regular rate and rhythm, No edema. Abdomen:  Soft, nontender and nondistended. Normal bowel sounds, without guarding, and without rebound.   Neurologic:  Alert and  oriented x4;  grossly normal neurologically.  Impression/Plan: Danielle Harrison is here for a colonoscopy to be performed for loose stools and EGD for abdominal pain  Risks, benefits, limitations, and alternatives regarding the procedures have been reviewed with the patient.  Questions have been answered.  All parties agreeable.   Pasty SpillersVarnita B Kately Graffam, MD  06/28/2019, 12:45 PM

## 2019-06-28 NOTE — Transfer of Care (Signed)
Immediate Anesthesia Transfer of Care Note  Patient: Danielle Harrison  Procedure(s) Performed: COLONOSCOPY WITH PROPOFOL (N/A Rectum) ESOPHAGOGASTRODUODENOSCOPY (EGD) WITH PROPOFOL (N/A Esophagus) BIOPSY (Esophagus)  Patient Location: PACU  Anesthesia Type: General  Level of Consciousness: awake, alert  and patient cooperative  Airway and Oxygen Therapy: Patient Spontanous Breathing and Patient connected to supplemental oxygen  Post-op Assessment: Post-op Vital signs reviewed, Patient's Cardiovascular Status Stable, Respiratory Function Stable, Patent Airway and No signs of Nausea or vomiting  Post-op Vital Signs: Reviewed and stable  Complications: No apparent anesthesia complications

## 2019-06-28 NOTE — Anesthesia Postprocedure Evaluation (Signed)
Anesthesia Post Note  Patient: Danielle Harrison  Procedure(s) Performed: COLONOSCOPY WITH PROPOFOL (N/A Rectum) ESOPHAGOGASTRODUODENOSCOPY (EGD) WITH PROPOFOL (N/A Esophagus) BIOPSY (Esophagus)  Patient location during evaluation: PACU Anesthesia Type: General Level of consciousness: awake and alert Pain management: pain level controlled Vital Signs Assessment: post-procedure vital signs reviewed and stable Respiratory status: spontaneous breathing, nonlabored ventilation, respiratory function stable and patient connected to nasal cannula oxygen Cardiovascular status: blood pressure returned to baseline and stable Postop Assessment: no apparent nausea or vomiting Anesthetic complications: no    Shanie Mauzy A  Bethel Gaglio

## 2019-06-28 NOTE — Anesthesia Procedure Notes (Signed)
Date/Time: 06/28/2019 12:59 PM Performed by: Cameron Ali, CRNA Pre-anesthesia Checklist: Patient identified, Emergency Drugs available, Suction available, Timeout performed and Patient being monitored Patient Re-evaluated:Patient Re-evaluated prior to induction Oxygen Delivery Method: Nasal cannula Placement Confirmation: positive ETCO2

## 2019-06-29 ENCOUNTER — Telehealth: Payer: Self-pay

## 2019-06-29 ENCOUNTER — Ambulatory Visit: Payer: 59 | Admitting: Gastroenterology

## 2019-06-29 ENCOUNTER — Other Ambulatory Visit: Payer: Self-pay

## 2019-06-29 ENCOUNTER — Encounter: Payer: Self-pay | Admitting: Gastroenterology

## 2019-06-29 NOTE — Telephone Encounter (Signed)
Per Dr. Bonna Gains patient does not need to be seen today because the results are not back from her procedure. Rescheduled patient to Thursday at 2:15

## 2019-07-01 ENCOUNTER — Other Ambulatory Visit: Payer: Self-pay

## 2019-07-01 ENCOUNTER — Encounter: Payer: Self-pay | Admitting: Gastroenterology

## 2019-07-01 ENCOUNTER — Ambulatory Visit (INDEPENDENT_AMBULATORY_CARE_PROVIDER_SITE_OTHER): Payer: 59 | Admitting: Gastroenterology

## 2019-07-01 VITALS — BP 125/78 | HR 112 | Temp 98.6°F | Wt 134.4 lb

## 2019-07-01 DIAGNOSIS — K319 Disease of stomach and duodenum, unspecified: Secondary | ICD-10-CM | POA: Diagnosis not present

## 2019-07-01 MED ORDER — OMEPRAZOLE 20 MG PO CPDR: 20.0000 mg | DELAYED_RELEASE_CAPSULE | Freq: Every day | ORAL | 0 refills | Status: DC

## 2019-07-01 NOTE — Progress Notes (Signed)
[name], MD [address]  [address]  [place], [place] [address]  Main: [phone]  Fax: [phone]   Primary Care Physician: [facility]   Chief Complaint  Patient presents with   Abdominal Pain    not having no symptoms now    Follow-up    Patient had procedure this week discuss results     HPI: Danielle Harrison is a 43 y.o. female presents for follow-up of abdominal pain.  Reports pain is better but still continues.  Midepigastric, once every 2 to 3 months with severe symptoms sometimes leaving her to the ER.  Sharp, nonradiating, 8/10.  Relieved after pain medications.  No nausea or vomiting associated with these episodes.  However, also reports bilateral lower quadrant pain that worsens at the time of her menses, no pain with intercourse, also leads her to be nauseous and vomiting with the specific lower quadrant pain episodes occur.  Has not seen a GYN in this regard.  Also reported loose stool on previous visit.  CT scan in May 2020 did not reveal any etiology for the pain.  Underwent EGD and colonoscopy in September 2020 for her symptoms EGD showed erythematous mucosa of the gastric fundus.  Biopsies were done.  Otherwise normal.  Colonoscopy was normal and biopsies were done for microscopic colitis.  Pathology pending  Current Outpatient Medications  Medication Sig Dispense Refill   cyclobenzaprine (FLEXERIL) 10 MG tablet      diclofenac (VOLTAREN) 75 MG EC tablet Take 75 mg by mouth 2 (two) times daily.     DULoxetine (CYMBALTA) 30 MG capsule Take 30 mg by mouth daily.  2   hydrOXYzine (VISTARIL) 25 MG capsule Take 25 mg by mouth at bedtime.     oxyCODONE-acetaminophen (PERCOCET/ROXICET) 5-325 MG tablet Take 1 tablet by mouth every 4 (four) hours as needed for severe pain. 10 tablet 0   pramipexole (MIRAPEX) 0.25 MG tablet Take 0.25 mg by mouth at bedtime.     omeprazole (PRILOSEC) 20 MG capsule Take 1 capsule (20 mg total) by  mouth daily. 60 capsule 0   No current facility-administered medications for this visit.     Allergies as of 07/01/2019   (No Known Allergies)    ROS:  General: Negative for anorexia, weight loss, fever, chills, fatigue, weakness. ENT: Negative for hoarseness, difficulty swallowing , nasal congestion. CV: Negative for chest pain, angina, palpitations, dyspnea on exertion, peripheral edema.  Respiratory: Negative for dyspnea at rest, dyspnea on exertion, cough, sputum, wheezing.  GI: See history of present illness. GU:  Negative for dysuria, hematuria, urinary incontinence, urinary frequency, nocturnal urination.  Endo: Negative for unusual weight change.    Physical Examination:   BP 125/78 (BP Location: Left Arm, Patient Position: Sitting, Cuff Size: Normal)    Pulse (!) 112    Temp 98.6 F (37 C) (Oral)    Wt 134 lb 6 oz (61 kg)    LMP 06/22/2019 (Approximate) Comment: neg. preg test   BMI 26.24 kg/m   General: Well-nourished, well-developed in no acute distress.  Eyes: No icterus. Conjunctivae pink. Mouth: Oropharyngeal mucosa moist and pink , no lesions erythema or exudate. Neck: Supple, Trachea midline Abdomen: Bowel sounds are normal, nontender, nondistended, no hepatosplenomegaly or masses, no abdominal bruits or hernia , no rebound or guarding.   Extremities: No lower extremity edema. No clubbing or deformities. Neuro: Alert and oriented x 3.  Grossly intact. Skin: Warm and dry, no jaundice.   Psych: Alert  and cooperative, normal mood and affect.   Labs: CMP     Component Value Date/Time   NA 136 06/25/2019 2035   NA 134 (L) 03/01/2014 1059   K 3.7 06/25/2019 2035   K 3.5 03/01/2014 1059   CL 99 06/25/2019 2035   CL 104 03/01/2014 1059   CO2 19 (L) 06/25/2019 2035   CO2 18 (L) 03/01/2014 1059   GLUCOSE 148 (H) 06/25/2019 2035   GLUCOSE 182 (H) 03/01/2014 1059   BUN 17 06/25/2019 2035   BUN 11 03/01/2014 1059   CREATININE 0.68 06/25/2019 2035   CREATININE  0.70 03/01/2014 1059   CALCIUM 10.4 (H) 06/25/2019 2035   CALCIUM 9.2 03/01/2014 1059   PROT 8.8 (H) 06/25/2019 2035   PROT 7.4 03/01/2014 1059   ALBUMIN 5.2 (H) 06/25/2019 2035   ALBUMIN 4.3 03/01/2014 1059   AST 18 06/25/2019 2035   AST 16 03/01/2014 1059   ALT 16 06/25/2019 2035   ALT 20 03/01/2014 1059   ALKPHOS 70 06/25/2019 2035   ALKPHOS 67 03/01/2014 1059   BILITOT 1.2 06/25/2019 2035   BILITOT 0.5 03/01/2014 1059   GFRNONAA >60 06/25/2019 2035   GFRNONAA >60 03/01/2014 1059   GFRAA >60 06/25/2019 2035   GFRAA >60 03/01/2014 1059   Lab Results  Component Value Date   WBC 14.4 (H) 06/25/2019   HGB 14.5 06/25/2019   HCT 40.8 06/25/2019   MCV 94.9 06/25/2019   PLT 423 (H) 06/25/2019    Imaging Studies: Ct Abdomen Pelvis W Contrast  Result Date: 06/26/2019 CLINICAL DATA:  Abdominal pain with nausea and vomiting EXAM: CT ABDOMEN AND PELVIS WITH CONTRAST TECHNIQUE: Multidetector CT imaging of the abdomen and pelvis was performed using the standard protocol following bolus administration of intravenous contrast. CONTRAST:  171mL OMNIPAQUE IOHEXOL 300 MG/ML  SOLN COMPARISON:  CT abdomen 03/10/2019 FINDINGS: LOWER CHEST: There is no basilar pleural or apical pericardial effusion. HEPATOBILIARY: The hepatic contours and density are normal. There is no intra- or extrahepatic biliary dilatation. The gallbladder is normal. PANCREAS: The pancreatic parenchymal contours are normal and there is no ductal dilatation. There is no peripancreatic fluid collection. SPLEEN: Normal. ADRENALS/URINARY TRACT: --Adrenal glands: Normal. --Right kidney/ureter: No hydronephrosis, nephroureterolithiasis, perinephric stranding or solid renal mass. --Left kidney/ureter: No hydronephrosis, nephroureterolithiasis, perinephric stranding or solid renal mass. --Urinary bladder: Normal for degree of distention STOMACH/BOWEL: --Stomach/Duodenum: There is no hiatal hernia or other gastric abnormality. The duodenal  course and caliber are normal. --Small bowel: No dilatation or inflammation. --Colon: No focal abnormality. --Appendix: Normal. VASCULAR/LYMPHATIC: Normal course and caliber of the major abdominal vessels. No abdominal or pelvic lymphadenopathy. REPRODUCTIVE: Normal uterus and ovaries. MUSCULOSKELETAL. No bony spinal canal stenosis or focal osseous abnormality. OTHER: None. IMPRESSION: No acute abdominal or pelvic abnormality. Electronically Signed   By: Ulyses Jarred M.D.   On: 06/26/2019 03:03    Assessment and Plan:   Danielle Harrison is a 43 y.o. y/o female here for follow-up of abdominal pain  For upper abdominal pain symptoms and lower quadrant pain symptoms seem unrelated and occurring at different times with different triggers.  Due to gastric erythema seen during the EGD, will start her on low-dose Prilosec to see if it improves her symptoms.  Awaiting pathology results as well  For lower quadrant abdominal pain that worsens at the time of her menses associated with nausea and vomiting at the time, and a negative CT scan, I have urged her to follow-up with GYN to get evaluation for  possible endometriosis  Follow-up in clinic in 2 to 3 months    Dr Melodie BouillonVarnita Jarred Purtee

## 2019-07-05 ENCOUNTER — Encounter: Payer: Self-pay | Admitting: Gastroenterology

## 2019-07-06 ENCOUNTER — Encounter: Payer: Self-pay | Admitting: Gastroenterology

## 2019-09-30 ENCOUNTER — Ambulatory Visit: Payer: 59 | Admitting: Gastroenterology

## 2019-09-30 ENCOUNTER — Encounter: Payer: Self-pay | Admitting: Gastroenterology

## 2020-01-11 ENCOUNTER — Other Ambulatory Visit: Payer: Self-pay

## 2020-01-11 ENCOUNTER — Emergency Department
Admission: EM | Admit: 2020-01-11 | Discharge: 2020-01-11 | Disposition: A | Discharge status: Home / Self Care | Payer: 59 | Attending: Emergency Medicine | Admitting: Emergency Medicine

## 2020-01-11 ENCOUNTER — Encounter: Payer: Self-pay | Admitting: Emergency Medicine

## 2020-01-11 DIAGNOSIS — K2900 Acute gastritis without bleeding: Secondary | ICD-10-CM

## 2020-01-11 DIAGNOSIS — F17210 Nicotine dependence, cigarettes, uncomplicated: Secondary | ICD-10-CM | POA: Insufficient documentation

## 2020-01-11 DIAGNOSIS — J45909 Unspecified asthma, uncomplicated: Secondary | ICD-10-CM | POA: Diagnosis not present

## 2020-01-11 DIAGNOSIS — R1030 Lower abdominal pain, unspecified: Secondary | ICD-10-CM | POA: Diagnosis present

## 2020-01-11 LAB — URINALYSIS, COMPLETE (UACMP) WITH MICROSCOPIC
Glucose, UA: NEGATIVE mg/dL
Ketones, ur: 20 mg/dL — AB
Leukocytes,Ua: NEGATIVE
Nitrite: NEGATIVE
Protein, ur: 100 mg/dL — AB
Specific Gravity, Urine: 1.031 — ABNORMAL HIGH (ref 1.005–1.030)
pH: 5 (ref 5.0–8.0)

## 2020-01-11 LAB — CBC
HCT: 44.5 % (ref 36.0–46.0)
Hemoglobin: 15.1 g/dL — ABNORMAL HIGH (ref 12.0–15.0)
MCH: 34.1 pg — ABNORMAL HIGH (ref 26.0–34.0)
MCHC: 33.9 g/dL (ref 30.0–36.0)
MCV: 100.5 fL — ABNORMAL HIGH (ref 80.0–100.0)
Platelets: 373 10*3/uL (ref 150–400)
RBC: 4.43 MIL/uL (ref 3.87–5.11)
RDW: 12.7 % (ref 11.5–15.5)
WBC: 8.4 10*3/uL (ref 4.0–10.5)
nRBC: 0 % (ref 0.0–0.2)

## 2020-01-11 LAB — COMPREHENSIVE METABOLIC PANEL
ALT: 23 U/L (ref 0–44)
AST: 32 U/L (ref 15–41)
Albumin: 5.1 g/dL — ABNORMAL HIGH (ref 3.5–5.0)
Alkaline Phosphatase: 83 U/L (ref 38–126)
Anion gap: 14 (ref 5–15)
BUN: 18 mg/dL (ref 6–20)
CO2: 24 mmol/L (ref 22–32)
Calcium: 10.1 mg/dL (ref 8.9–10.3)
Chloride: 97 mmol/L — ABNORMAL LOW (ref 98–111)
Creatinine, Ser: 0.92 mg/dL (ref 0.44–1.00)
GFR calc Af Amer: >60 mL/min (ref >60)
GFR calc non Af Amer: >60 mL/min (ref >60)
Glucose, Bld: 156 mg/dL — ABNORMAL HIGH (ref 70–99)
Potassium: 3.5 mmol/L (ref 3.5–5.1)
Sodium: 135 mmol/L (ref 135–145)
Total Bilirubin: 1.5 mg/dL — ABNORMAL HIGH (ref 0.3–1.2)
Total Protein: 8.7 g/dL — ABNORMAL HIGH (ref 6.5–8.1)

## 2020-01-11 LAB — LIPASE, BLOOD: Lipase: 28 U/L (ref 11–51)

## 2020-01-11 LAB — POCT PREGNANCY, URINE: Preg Test, Ur: NEGATIVE

## 2020-01-11 MED ORDER — DEXILANT 30 MG PO CPDR: 30.0000 mg | DELAYED_RELEASE_CAPSULE | Freq: Every day | ORAL | 1 refills | Status: DC

## 2020-01-11 MED ORDER — SODIUM CHLORIDE 0.9 % IV SOLN: Freq: Once | INTRAVENOUS | Status: AC

## 2020-01-11 MED ORDER — ONDANSETRON HCL 4 MG/2ML IJ SOLN
4.0000 mg | Freq: Once | INTRAMUSCULAR | Status: AC
  Administered 2020-01-11: 4 mg via INTRAVENOUS
  Filled 2020-01-11: qty 2

## 2020-01-11 MED ORDER — OXYCODONE-ACETAMINOPHEN 5-325 MG PO TABS: 1.0000 | ORAL_TABLET | Freq: Three times a day (TID) | ORAL | 0 refills | Status: DC | PRN

## 2020-01-11 MED ORDER — FAMOTIDINE IN NACL 20-0.9 MG/50ML-% IV SOLN
20.0000 mg | Freq: Once | INTRAVENOUS | Status: AC
  Administered 2020-01-11: 20 mg via INTRAVENOUS
  Filled 2020-01-11: qty 50

## 2020-01-11 MED ORDER — MORPHINE SULFATE (PF) 4 MG/ML IV SOLN
4.0000 mg | Freq: Once | INTRAVENOUS | Status: AC
  Administered 2020-01-11: 14:00:00 4 mg via INTRAVENOUS
  Filled 2020-01-11: qty 1

## 2020-01-11 MED ORDER — PROMETHAZINE HCL 25 MG PO TABS: 25.0000 mg | ORAL_TABLET | Freq: Four times a day (QID) | ORAL | 0 refills | Status: DC | PRN

## 2020-01-11 NOTE — ED Notes (Signed)
Pt alert and oriented X 4, stable for discharge. RR even and unlabored, color WNL. Discussed discharge instructions and follow up when appropriate. Instructed to follow up with ER for any life threatening symptoms or concerns that patient or family of patient may have Left with mother.

## 2020-01-11 NOTE — ED Provider Notes (Signed)
[facility] Emergency Department Provider Note       Time seen: ----------------------------------------- 1:51 PM on 01/11/2020 -----------------------------------------   I have reviewed the triage vital signs and the nursing notes.  HISTORY   Chief Complaint Abdominal Pain and Emesis   HPI Danielle Harrison is a 44 y.o. female with a history of anxiety, arthritis, asthma, transverse myelitis, restless leg syndrome who presents to the ED for lower abdominal pain with vomiting since Sunday.  Patient said had any diarrhea or fevers.  Reports this happened about every 6 months, has seen gastroenterology without any specific diagnosis.  She has chronic pain, daily takes ibuprofen for pain.  Past Medical History:  Diagnosis Date  . Anxiety   . Arthritis    back  . Asthma   . Neuromuscular disorder (HCC)    weakness and numbness   . Restless leg syndrome     Patient Active Problem List   Diagnosis Date Noted  . Stomach ache   . Diarrhea   . Stomach irritation     Past Surgical History:  Procedure Laterality Date  . BIOPSY  06/28/2019   Procedure: BIOPSY;  Surgeon: Pasty Spillers, MD;  Location: [facility];  Service: Endoscopy;;  . CESAREAN SECTION     x2  . COLONOSCOPY WITH PROPOFOL N/A 06/28/2019   Procedure: COLONOSCOPY WITH PROPOFOL;  Surgeon: Pasty Spillers, MD;  Location: [facility];  Service: Endoscopy;  Laterality: N/A;  . ESOPHAGOGASTRODUODENOSCOPY (EGD) WITH PROPOFOL N/A 06/28/2019   Procedure: ESOPHAGOGASTRODUODENOSCOPY (EGD) WITH PROPOFOL;  Surgeon: Pasty Spillers, MD;  Location: [facility];  Service: Endoscopy;  Laterality: N/A;    Allergies Patient has no known allergies.  Social History Social History   Tobacco Use  . Smoking status: Current Every Day Smoker    Packs/day: 0.50    Years: 6.00    Pack years: 3.00    Types: Cigarettes  . Smokeless tobacco: Never Used  Substance Use  Topics  . Alcohol use: Yes    Comment: occasionally  . Drug use: Not on file    Review of Systems Constitutional: Negative for fever. Cardiovascular: Negative for chest pain. Respiratory: Negative for shortness of breath. Gastrointestinal: Positive for abdominal pain, vomiting Musculoskeletal: Negative for back pain. Skin: Negative for rash. Neurological: Negative for headaches, focal weakness or numbness.  All systems negative/normal/unremarkable except as stated in the HPI  ____________________________________________   PHYSICAL EXAM:  VITAL SIGNS: ED Triage Vitals  Enc Vitals Group     BP 01/11/20 1152 (!) 152/100     Pulse Rate 01/11/20 1152 (!) 118     Resp 01/11/20 1152 20     Temp 01/11/20 1152 98.3 F (36.8 C)     Temp Source 01/11/20 1152 Oral     SpO2 01/11/20 1152 99 %     Weight 01/11/20 1152 140 lb (63.5 kg)     Height 01/11/20 1152 5' (1.524 m)     Head Circumference --      Peak Flow --      Pain Score 01/11/20 1154 10     Pain Loc --      Pain Edu? --      Excl. in GC? --     Constitutional: Alert and oriented. Well appearing and in no distress. Eyes: Conjunctivae are normal. Normal extraocular movements. Cardiovascular: Normal rate, regular rhythm. No murmurs, rubs, or gallops. Respiratory: Normal respiratory effort without tachypnea nor retractions. Breath sounds are clear and equal  bilaterally. No wheezes/rales/rhonchi. Gastrointestinal: Mild epigastric tenderness, no rebound or guarding.  Normal bowel sounds. Musculoskeletal: Nontender with normal range of motion in extremities. No lower extremity tenderness nor edema. Neurologic:  Normal speech and language. No gross focal neurologic deficits are appreciated.  Skin:  Skin is warm, dry and intact. No rash noted. Psychiatric: Mood and affect are normal. Speech and behavior are normal.  ____________________________________________  ED COURSE:  As part of my medical decision making, I reviewed  the following data within the [place] History obtained from family if available, nursing notes, old chart and ekg, as well as notes from prior ED visits. Patient presented for abdominal pain and vomiting, we will assess with labs and imaging as indicated at this time.   Procedures  Danielle Harrison was evaluated in Emergency Department on 01/11/2020 for the symptoms described in the history of present illness. She was evaluated in the context of the global COVID-19 pandemic, which necessitated consideration that the patient might be at risk for infection with the SARS-CoV-2 virus that causes COVID-19. Institutional protocols and algorithms that pertain to the evaluation of patients at risk for COVID-19 are in a state of rapid change based on information released by regulatory bodies including the CDC and federal and state organizations. These policies and algorithms were followed during the patient's care in the ED.  ____________________________________________   LABS (pertinent positives/negatives)  Labs Reviewed  COMPREHENSIVE METABOLIC PANEL - Abnormal; Notable for the following components:      Result Value   Chloride 97 (*)    Glucose, Bld 156 (*)    Total Protein 8.7 (*)    Albumin 5.1 (*)    Total Bilirubin 1.5 (*)    All other components within normal limits  CBC - Abnormal; Notable for the following components:   Hemoglobin 15.1 (*)    MCV 100.5 (*)    MCH 34.1 (*)    All other components within normal limits  URINALYSIS, COMPLETE (UACMP) WITH MICROSCOPIC - Abnormal; Notable for the following components:   Color, Urine AMBER (*)    APPearance CLOUDY (*)    Specific Gravity, Urine 1.031 (*)    Hgb urine dipstick SMALL (*)    Bilirubin Urine SMALL (*)    Ketones, ur 20 (*)    Protein, ur 100 (*)    Bacteria, UA FEW (*)    All other components within normal limits  LIPASE, BLOOD  POC URINE PREG, ED  POCT PREGNANCY, URINE   ____________________________________________   DIFFERENTIAL DIAGNOSIS   Gastritis, gastroenteritis, peptic ulcer disease, dehydration, electrolyte abnormality  FINAL ASSESSMENT AND PLAN  Gastritis   Plan: The patient had presented for gastritis or ulceration with chronic NSAID usage. Patient's labs did indicate some dehydration for which she was given IV fluids.  We also gave morphine, Zofran and Pepcid.  Patient currently feeling better, she has been advised to stop NSAID use and follow-up with her doctor for recheck.   Laurence Aly, MD    Note: This note was generated in part or whole with voice recognition software. Voice recognition is usually quite accurate but there are transcription errors that can and very often do occur. I apologize for any typographical errors that were not detected and corrected.     Earleen Newport, MD 01/11/20 [phone]

## 2020-01-11 NOTE — ED Triage Notes (Signed)
Pt c/o lower abdominal pain and vomiting since Sunday. No diarrhea or fevers.  Reports this happens about every 6 months, has seen GI but cannot find out why. Ambulatory. VSS.

## 2020-01-20 ENCOUNTER — Encounter: Payer: Self-pay | Admitting: Gastroenterology

## 2020-01-20 ENCOUNTER — Ambulatory Visit (INDEPENDENT_AMBULATORY_CARE_PROVIDER_SITE_OTHER): Payer: 59 | Admitting: Gastroenterology

## 2020-01-20 DIAGNOSIS — R109 Unspecified abdominal pain: Secondary | ICD-10-CM

## 2020-01-20 MED ORDER — OMEPRAZOLE 40 MG PO CPDR: 40.0000 mg | DELAYED_RELEASE_CAPSULE | Freq: Two times a day (BID) | ORAL | 0 refills | Status: DC

## 2020-01-20 NOTE — Progress Notes (Signed)
Danielle Antigua, MD [address]  [place]  [place], [place] [address]  Main: [phone]  Fax: [phone]   Primary Care Physician: [place]  Virtual Visit via Video Note  I connected with patient on 01/20/20 at 11:30 AM EDT by video (using [email or web address]) and verified that I am speaking with the correct person using two identifiers.   I discussed the limitations, risks, security and privacy concerns of performing an evaluation and management service by video and the availability of in person appointments. I also discussed with the patient that there may be a patient responsible charge related to this service. The patient expressed understanding and agreed to proceed.  Location of Patient: Home Location of Provider: Home Persons involved: Patient and provider only (Nursing staff checked in patient via phone but were not physically involved in the video interaction - see their notes)   History of Present Illness: Chief Complaint  Patient presents with  . Nausea    Patient is having nausea and vomiting. Patient is having RUQ abdominal pain that sharp.     HPI: Danielle Harrison is a 44 y.o. female here for follow-up of abdominal pain.  Went to the ER recently for the same in March 2021 and symptoms got better with morphine and IV hydration.  Symptoms occur intermittently and is described as epigastric pain and discomfort.  She is fine in between episodes.  No hematemesis.  Takes NSAIDs daily due to transverse myelitis.  EGD had shown an area of erythema in the gastric fundus with biopsies being unrevealing and just showing gastropathy.  Is taking Prilosec once a day.  CT scan has been unrevealing as well.  Normal lipase.  Current Outpatient Medications  Medication Sig Dispense Refill  . albuterol (PROVENTIL) (2.5 MG/3ML) 0.083% nebulizer solution USE CONTENTS IN NEBULIZER UP TO THREE TIMES DAILY AS NEEDED    . albuterol (VENTOLIN HFA) 108 (90 Base) MCG/ACT inhaler  SMARTSIG:1 Puff(s) By Mouth Every 6 Hours PRN    . cyclobenzaprine (FLEXERIL) 10 MG tablet     . Dexlansoprazole (DEXILANT) 30 MG capsule Take 1 capsule (30 mg total) by mouth daily. 30 capsule 1  . diclofenac (VOLTAREN) 75 MG EC tablet Take 75 mg by mouth 2 (two) times daily.    . DULoxetine (CYMBALTA) 30 MG capsule Take 30 mg by mouth daily.  2  . gabapentin (NEURONTIN) 300 MG capsule Take 300 mg by mouth at bedtime.    . hydrOXYzine (VISTARIL) 25 MG capsule Take 25 mg by mouth at bedtime.    . ondansetron (ZOFRAN) 8 MG tablet Take 8 mg by mouth every 8 (eight) hours as needed.    . pramipexole (MIRAPEX) 0.25 MG tablet Take 0.25 mg by mouth at bedtime.    . promethazine (PHENERGAN) 25 MG tablet Take 1 tablet (25 mg total) by mouth every 6 (six) hours as needed for nausea or vomiting. 20 tablet 0  . simvastatin (ZOCOR) 20 MG tablet Take 20 mg by mouth at bedtime.    . SYMBICORT 160-4.5 MCG/ACT inhaler SMARTSIG:2 Puff(s) By Mouth Twice Daily    . Vitamin D, Ergocalciferol, (DRISDOL) 1.25 MG (50000 UNIT) CAPS capsule Take 50,000 Units by mouth once a week.    Marland Kitchen omeprazole (PRILOSEC) 40 MG capsule Take 1 capsule (40 mg total) by mouth in the morning and at bedtime. 60 capsule 0   No current facility-administered medications for this visit.    Allergies as of 01/20/2020  . (No Known Allergies)  Review of Systems:    All systems reviewed and negative except where noted in HPI.   Observations/Objective:  Labs: CMP     Component Value Date/Time   NA 135 01/11/2020 1156   NA 134 (L) 03/01/2014 1059   K 3.5 01/11/2020 1156   K 3.5 03/01/2014 1059   CL 97 (L) 01/11/2020 1156   CL 104 03/01/2014 1059   CO2 24 01/11/2020 1156   CO2 18 (L) 03/01/2014 1059   GLUCOSE 156 (H) 01/11/2020 1156   GLUCOSE 182 (H) 03/01/2014 1059   BUN 18 01/11/2020 1156   BUN 11 03/01/2014 1059   CREATININE 0.92 01/11/2020 1156   CREATININE 0.70 03/01/2014 1059   CALCIUM 10.1 01/11/2020 1156   CALCIUM  9.2 03/01/2014 1059   PROT 8.7 (H) 01/11/2020 1156   PROT 7.4 03/01/2014 1059   ALBUMIN 5.1 (H) 01/11/2020 1156   ALBUMIN 4.3 03/01/2014 1059   AST 32 01/11/2020 1156   AST 16 03/01/2014 1059   ALT 23 01/11/2020 1156   ALT 20 03/01/2014 1059   ALKPHOS 83 01/11/2020 1156   ALKPHOS 67 03/01/2014 1059   BILITOT 1.5 (H) 01/11/2020 1156   BILITOT 0.5 03/01/2014 1059   GFRNONAA >60 01/11/2020 1156   GFRNONAA >60 03/01/2014 1059   GFRAA >60 01/11/2020 1156   GFRAA >60 03/01/2014 1059   Lab Results  Component Value Date   WBC 8.4 01/11/2020   HGB 15.1 (H) 01/11/2020   HCT 44.5 01/11/2020   MCV 100.5 (H) 01/11/2020   PLT 373 01/11/2020    Imaging Studies: No results found.  Assessment and Plan:   Danielle Harrison is a 44 y.o. y/o female with abdominal pain, daily NSAID use, and gastric erythema noted on last EGD in November 2020  Assessment and Plan: Her symptoms may be due to underlying gastritis due to having to use NSAIDs daily.  Increase Prilosec to 40 mg twice daily.  Patient gets this over-the-counter and does not need a prescription.  Verbalized understanding on the right dosage and will take it for 4 weeks.  If symptoms are not better in 3 weeks she will notify us.  Consider repeat EGD versus CT scan at that time if needed  Follow Up Instructions:    I discussed the assessment and treatment plan with the patient. The patient was provided an opportunity to ask questions and all were answered. The patient agreed with the plan and demonstrated an understanding of the instructions.   The patient was advised to call back or seek an in-person evaluation if the symptoms worsen or if the condition fails to improve as anticipated.  I provided 15 minutes of face-to-face time via video software during this encounter. Additional time was spent in reviewing patient's chart, placing orders etc.   Pasty Spillers, MD  Speech recognition software was used to dictate this note.

## 2020-01-20 NOTE — Patient Instructions (Signed)
Please take omeprazole 40mg  Twice a day for 30 days.

## 2020-03-01 ENCOUNTER — Ambulatory Visit (INDEPENDENT_AMBULATORY_CARE_PROVIDER_SITE_OTHER): Payer: 59 | Admitting: Gastroenterology

## 2020-03-01 ENCOUNTER — Other Ambulatory Visit: Payer: Self-pay

## 2020-03-01 ENCOUNTER — Encounter: Payer: Self-pay | Admitting: Gastroenterology

## 2020-03-01 VITALS — BP 124/73 | HR 132 | Temp 98.2°F | Wt 153.0 lb

## 2020-03-01 DIAGNOSIS — R1013 Epigastric pain: Secondary | ICD-10-CM | POA: Diagnosis not present

## 2020-03-02 NOTE — Progress Notes (Signed)
[name], MD [address]  [address]  [place], [place] [address]  Main: [phone]  Fax: [phone]   Primary Care Physician: [facility]   Chief Complaint  Patient presents with  . Abdominal Pain    Patient stated that she had been feeling better.     HPI: ZILDA NO is a 44 y.o. female with daily NSAID use due to history of transverse myelitis, gastritis, and abdominal pain here for follow-up.  Patients PPI was increased to twice daily on last visit due to need for continued NSAID use and with this patient reports complete resolution of symptoms.  However, in addition to the Prilosec, she is also taking Dexilant that was given by her primary care provider.  No nausea or vomiting, no altered bowel habits or blood in stool.  Current Outpatient Medications  Medication Sig Dispense Refill  . albuterol (PROVENTIL) (2.5 MG/3ML) 0.083% nebulizer solution USE CONTENTS IN NEBULIZER UP TO THREE TIMES DAILY AS NEEDED    . albuterol (VENTOLIN HFA) 108 (90 Base) MCG/ACT inhaler SMARTSIG:1 Puff(s) By Mouth Every 6 Hours PRN    . cyclobenzaprine (FLEXERIL) 10 MG tablet     . Dexlansoprazole (DEXILANT) 30 MG capsule Take 1 capsule (30 mg total) by mouth daily. 30 capsule 1  . diclofenac (VOLTAREN) 75 MG EC tablet Take 75 mg by mouth 2 (two) times daily.    . DULoxetine (CYMBALTA) 30 MG capsule Take 30 mg by mouth daily.  2  . gabapentin (NEURONTIN) 300 MG capsule Take 300 mg by mouth at bedtime.    . hydrOXYzine (VISTARIL) 25 MG capsule Take 25 mg by mouth at bedtime.    . ondansetron (ZOFRAN) 8 MG tablet Take 8 mg by mouth every 8 (eight) hours as needed.    . pramipexole (MIRAPEX) 0.25 MG tablet Take 0.25 mg by mouth at bedtime.    . simvastatin (ZOCOR) 20 MG tablet Take 20 mg by mouth at bedtime.    . SYMBICORT 160-4.5 MCG/ACT inhaler SMARTSIG:2 Puff(s) By Mouth Twice Daily    . Vitamin D, Ergocalciferol, (DRISDOL) 1.25 MG (50000 UNIT) CAPS capsule Take  50,000 Units by mouth once a week.     No current facility-administered medications for this visit.    Allergies as of 03/01/2020  . (No Known Allergies)    ROS:  General: Negative for anorexia, weight loss, fever, chills, fatigue, weakness. ENT: Negative for hoarseness, difficulty swallowing , nasal congestion. CV: Negative for chest pain, angina, palpitations, dyspnea on exertion, peripheral edema.  Respiratory: Negative for dyspnea at rest, dyspnea on exertion, cough, sputum, wheezing.  GI: See history of present illness. GU:  Negative for dysuria, hematuria, urinary incontinence, urinary frequency, nocturnal urination.  Endo: Negative for unusual weight change.    Physical Examination:   BP 124/73   Pulse (!) 132   Temp 98.2 F (36.8 C) (Oral)   Wt 153 lb (69.4 kg)   BMI 29.88 kg/m   General: Well-nourished, well-developed in no acute distress.  Eyes: No icterus. Conjunctivae pink. Mouth: Oropharyngeal mucosa moist and pink , no lesions erythema or exudate. Neck: Supple, Trachea midline Abdomen: Bowel sounds are normal, nontender, nondistended, no hepatosplenomegaly or masses, no abdominal bruits or hernia , no rebound or guarding.   Extremities: No lower extremity edema. No clubbing or deformities. Neuro: Alert and oriented x 3.  Grossly intact. Skin: Warm and dry, no jaundice.   Psych: Alert and cooperative, normal mood and affect.   Labs: Reviewed  Imaging Studies: No acute abnormality on Jul 24, 2019 CT  Assessment and Plan:   SAMARIYA ROCKHOLD is a 44 y.o. 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  EGD findings and labs are otherwise reassuring.  However, I have asked her to discontinue one of her PPIs.  Since Dexilant is working better, discontinued omeprazole.  I have advised her not to take 2 separate PPIs  together, and she verbalized understanding.  Patient advised to let us know if pain reoccurs  (Risks of PPI use were discussed with patient including bone loss, C. Diff diarrhea, pneumonia, infections, CKD, electrolyte abnormalities.  Pt. Verbalizes understanding and chooses to continue the medication.)     Dr [name]

## 2020-05-17 ENCOUNTER — Other Ambulatory Visit: Payer: Self-pay | Admitting: Emergency Medicine

## 2020-06-01 IMAGING — US US ABDOMEN LIMITED
1 series · 14 of 25 positions shown · non-contrast
Comparison: CT abdomen/pelvis dated 03/01/2014.

CLINICAL DATA: Abdominal pain x1 day

EXAM:
ULTRASOUND ABDOMEN LIMITED RIGHT UPPER QUADRANT

[Series 1: us abdomen limited · 14 of 46 slices shown]
[im 1/46]
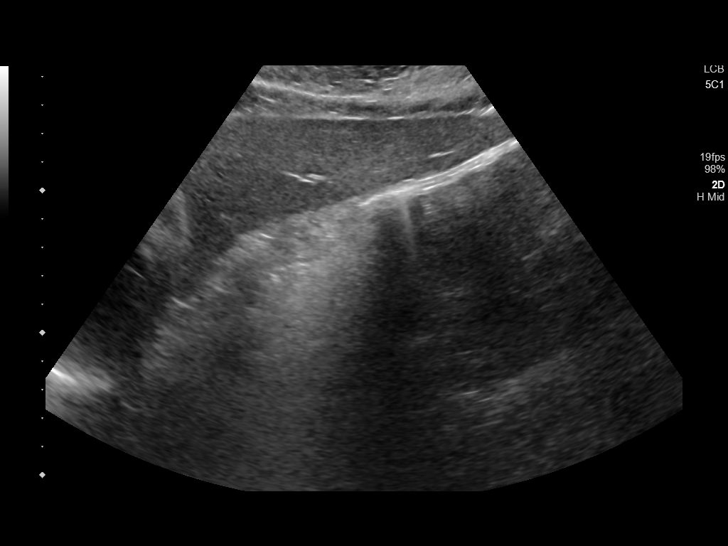
[im 4/46]
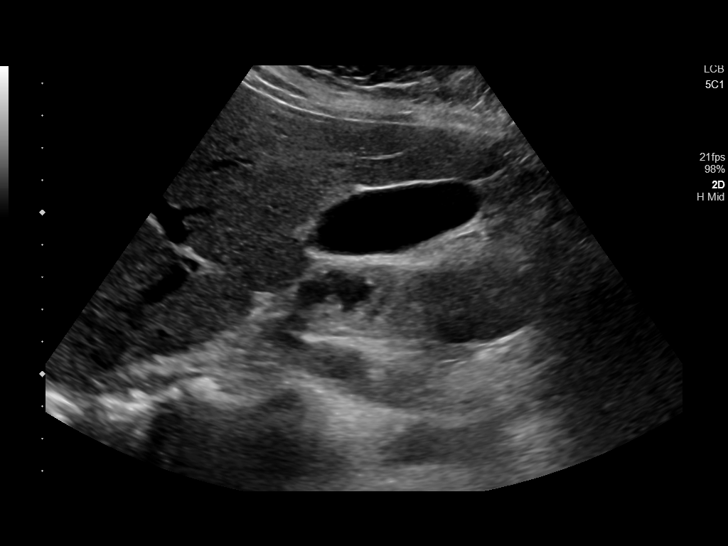
[im 8/46]
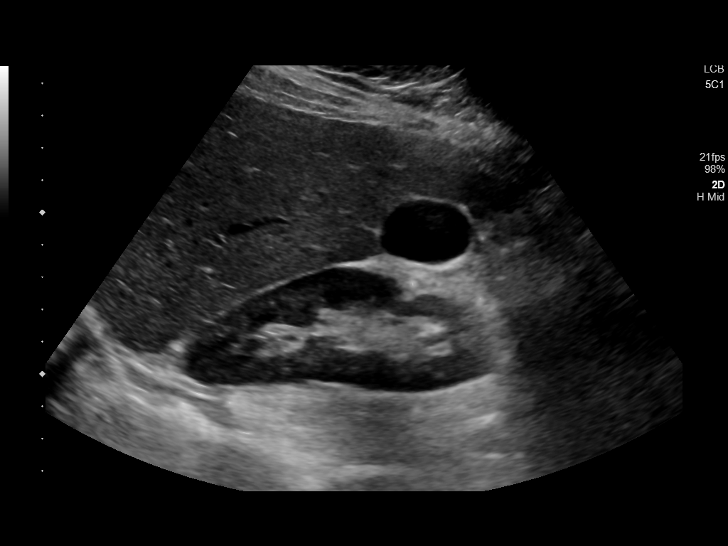
[im 12/46]
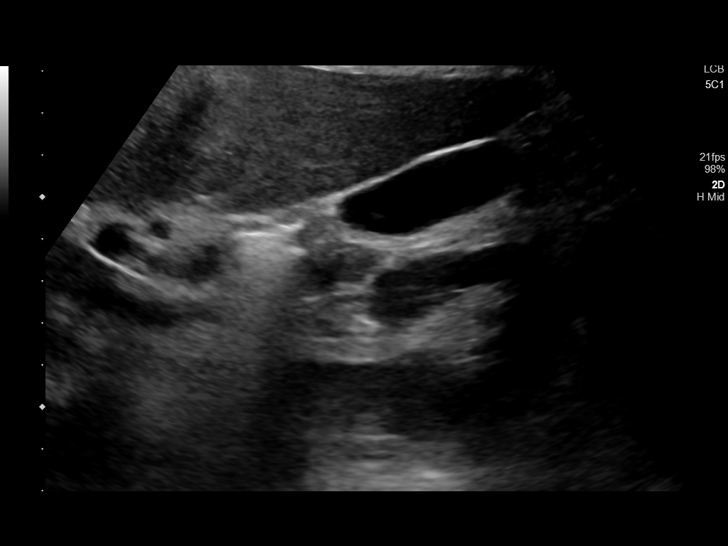
[im 16/46]
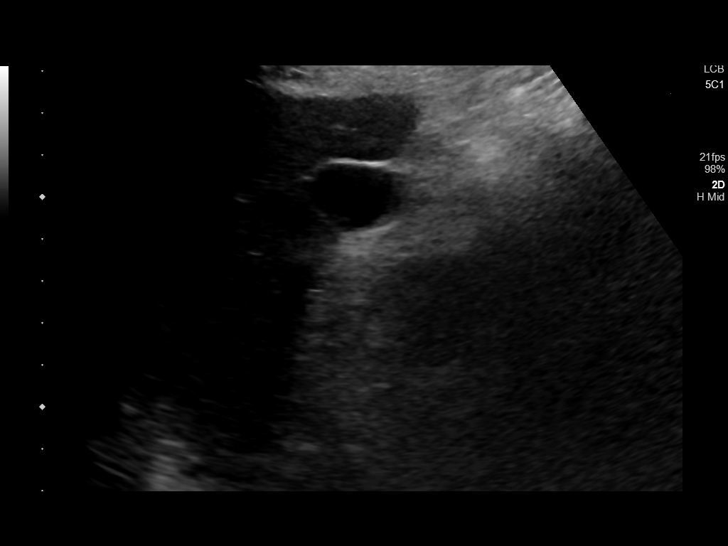
[im 17/46]
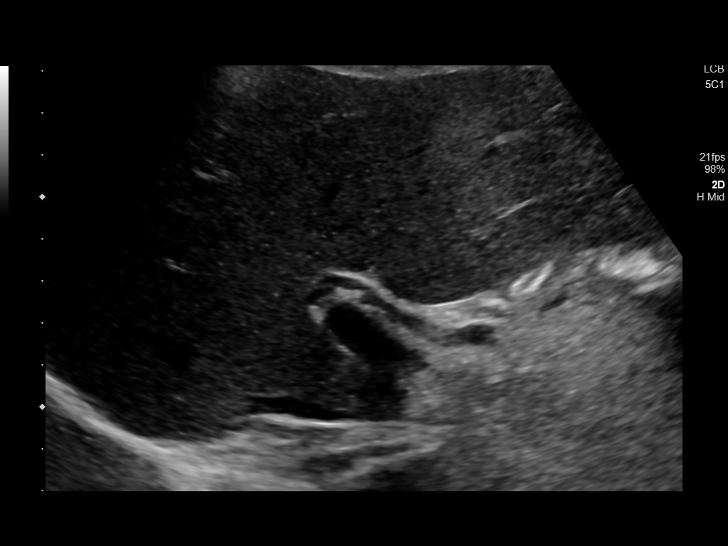
[im 21/46]
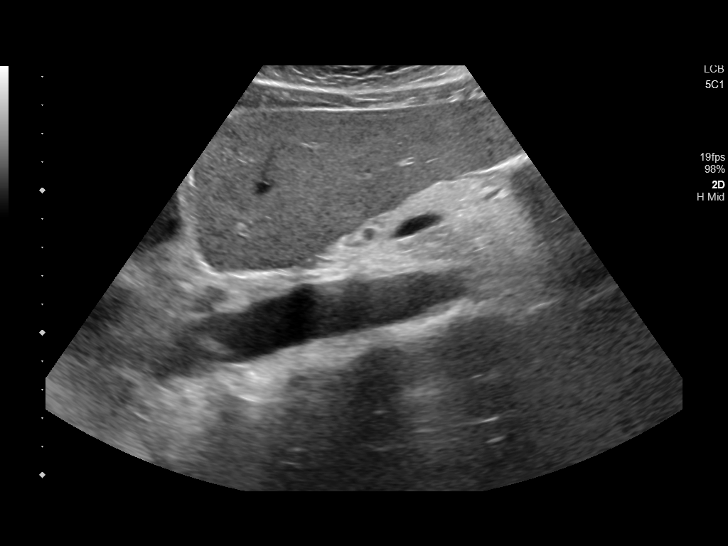
[im 25/46]
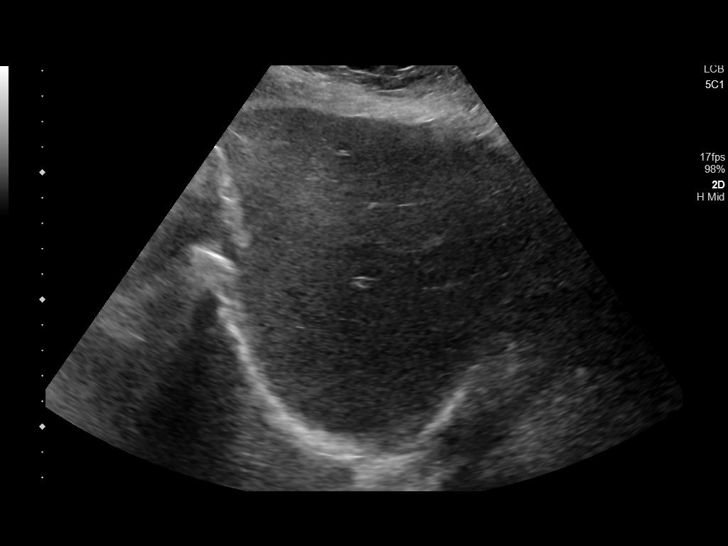
[im 29/46]
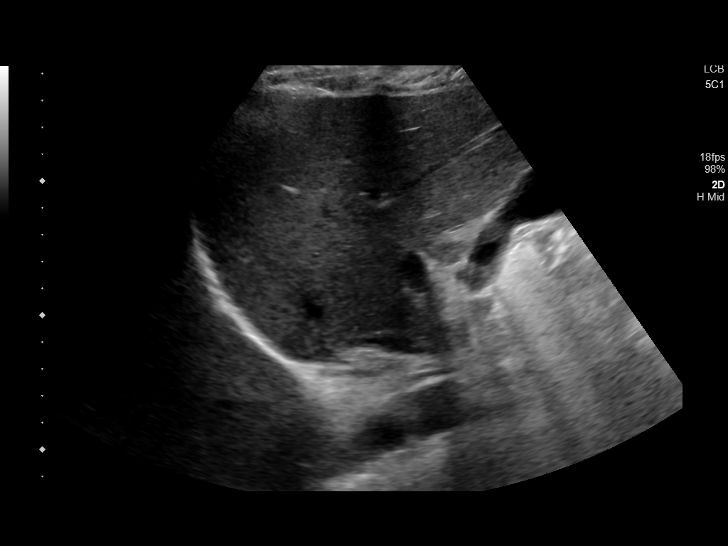
[im 31/46]
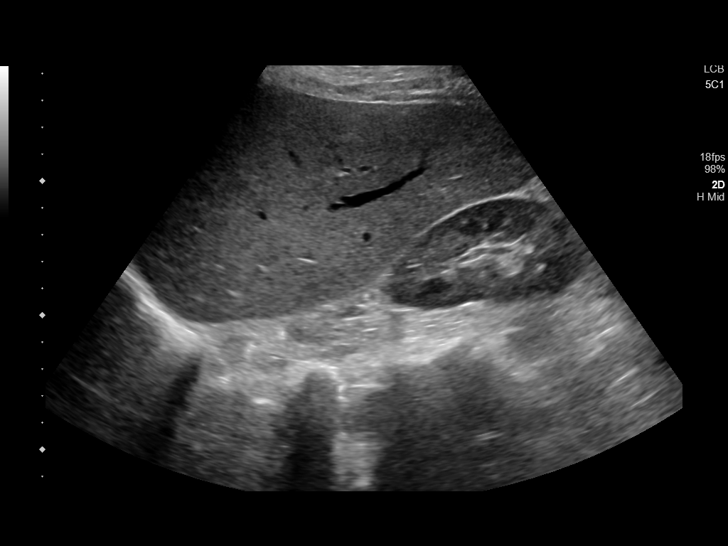
[im 34/46]
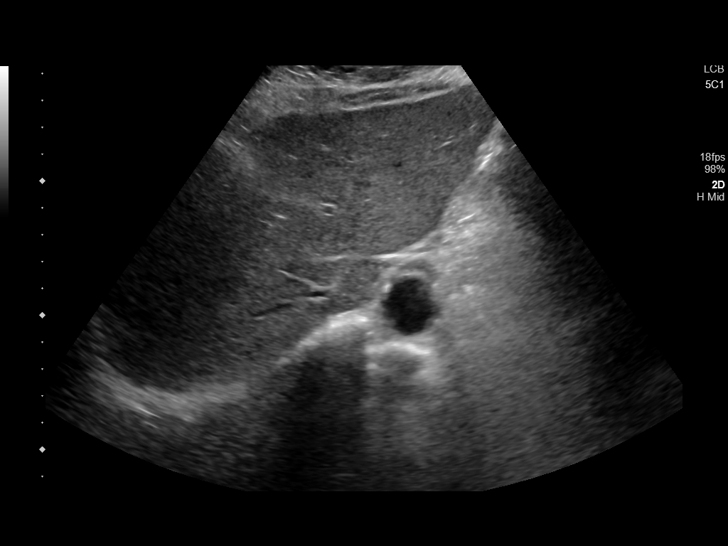
[im 38/46]
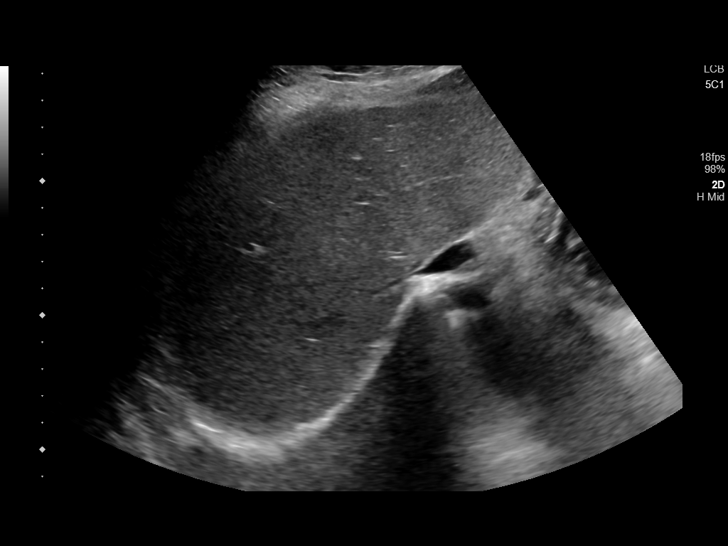
[im 42/46]
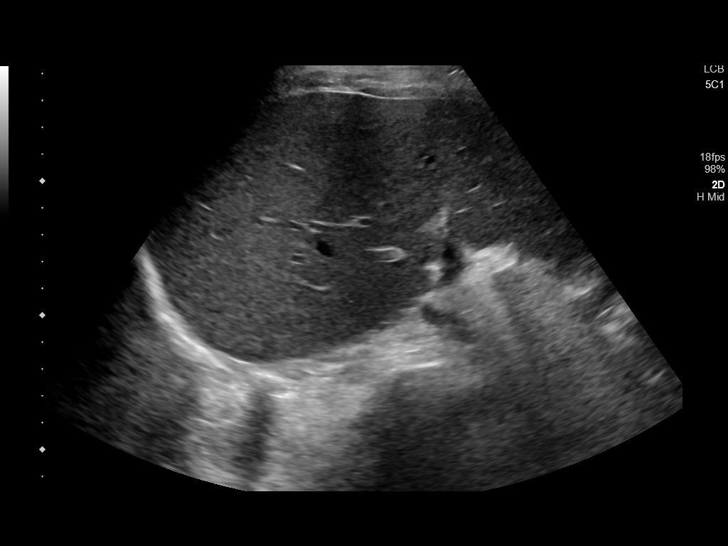
[im 46/46]
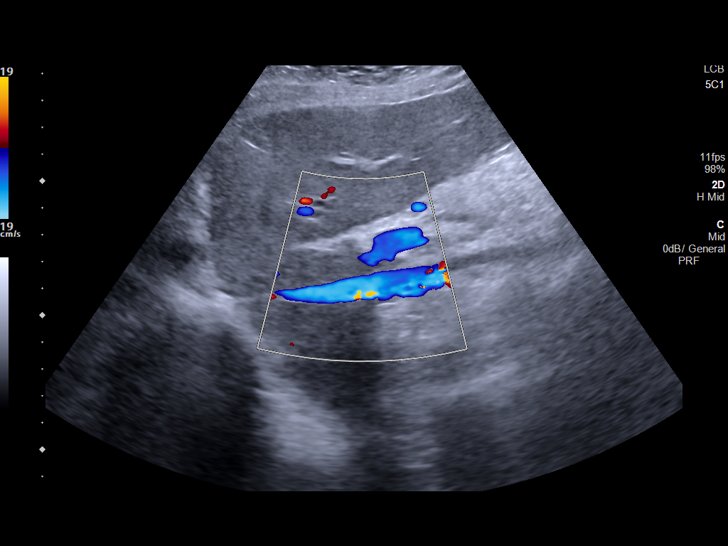

[14 of 25 positions shown; findings below may reference images not displayed]

FINDINGS: Gallbladder:

No gallstones, gallbladder wall thickening, or pericholecystic
fluid.

Common bile duct:

Diameter: 4 mm

Liver:

No focal lesion identified. Within normal limits in parenchymal
echogenicity. Portal vein is patent on color Doppler imaging with
normal direction of blood flow towards the liver.
IMPRESSION: Negative right upper quadrant ultrasound.

## 2020-06-28 ENCOUNTER — Other Ambulatory Visit: Payer: Self-pay | Admitting: Emergency Medicine

## 2020-08-24 ENCOUNTER — Emergency Department
Admission: EM | Admit: 2020-08-24 | Discharge: 2020-08-24 | Disposition: A | Discharge status: Home / Self Care | Payer: 59 | Attending: Emergency Medicine | Admitting: Emergency Medicine

## 2020-08-24 ENCOUNTER — Encounter: Payer: Self-pay | Admitting: Emergency Medicine

## 2020-08-24 ENCOUNTER — Other Ambulatory Visit: Payer: Self-pay

## 2020-08-24 DIAGNOSIS — F17210 Nicotine dependence, cigarettes, uncomplicated: Secondary | ICD-10-CM | POA: Insufficient documentation

## 2020-08-24 DIAGNOSIS — R109 Unspecified abdominal pain: Secondary | ICD-10-CM | POA: Diagnosis present

## 2020-08-24 DIAGNOSIS — K2900 Acute gastritis without bleeding: Secondary | ICD-10-CM | POA: Insufficient documentation

## 2020-08-24 DIAGNOSIS — J45909 Unspecified asthma, uncomplicated: Secondary | ICD-10-CM | POA: Diagnosis not present

## 2020-08-24 LAB — CBC
HCT: 44.5 % (ref 36.0–46.0)
Hemoglobin: 15.8 g/dL — ABNORMAL HIGH (ref 12.0–15.0)
MCH: 34.7 pg — ABNORMAL HIGH (ref 26.0–34.0)
MCHC: 35.5 g/dL (ref 30.0–36.0)
MCV: 97.8 fL (ref 80.0–100.0)
Platelets: 293 10*3/uL (ref 150–400)
RBC: 4.55 MIL/uL (ref 3.87–5.11)
RDW: 12 % (ref 11.5–15.5)
WBC: 10.8 10*3/uL — ABNORMAL HIGH (ref 4.0–10.5)
nRBC: 0 % (ref 0.0–0.2)

## 2020-08-24 LAB — URINALYSIS, COMPLETE (UACMP) WITH MICROSCOPIC
Bilirubin Urine: NEGATIVE
Glucose, UA: NEGATIVE mg/dL
Ketones, ur: NEGATIVE mg/dL
Leukocytes,Ua: NEGATIVE
Nitrite: NEGATIVE
Protein, ur: 100 mg/dL — AB
Specific Gravity, Urine: 1.019 (ref 1.005–1.030)
pH: 5 (ref 5.0–8.0)

## 2020-08-24 LAB — COMPREHENSIVE METABOLIC PANEL
ALT: 25 U/L (ref 0–44)
AST: 22 U/L (ref 15–41)
Albumin: 4.6 g/dL (ref 3.5–5.0)
Alkaline Phosphatase: 87 U/L (ref 38–126)
Anion gap: 15 (ref 5–15)
BUN: 16 mg/dL (ref 6–20)
CO2: 17 mmol/L — ABNORMAL LOW (ref 22–32)
Calcium: 9.9 mg/dL (ref 8.9–10.3)
Chloride: 103 mmol/L (ref 98–111)
Creatinine, Ser: 0.74 mg/dL (ref 0.44–1.00)
GFR, Estimated: >60 mL/min (ref >60)
Glucose, Bld: 188 mg/dL — ABNORMAL HIGH (ref 70–99)
Potassium: 3.4 mmol/L — ABNORMAL LOW (ref 3.5–5.1)
Sodium: 135 mmol/L (ref 135–145)
Total Bilirubin: 1.1 mg/dL (ref 0.3–1.2)
Total Protein: 8.2 g/dL — ABNORMAL HIGH (ref 6.5–8.1)

## 2020-08-24 LAB — POC URINE PREG, ED: Preg Test, Ur: NEGATIVE

## 2020-08-24 LAB — LIPASE, BLOOD: Lipase: 89 U/L — ABNORMAL HIGH (ref 11–51)

## 2020-08-24 MED ORDER — SODIUM CHLORIDE 0.9 % IV SOLN
1000.0000 mL | Freq: Once | INTRAVENOUS | Status: AC
  Administered 2020-08-24: 1000 mL via INTRAVENOUS

## 2020-08-24 MED ORDER — ONDANSETRON 4 MG PO TBDP: 4.0000 mg | ORAL_TABLET | Freq: Three times a day (TID) | ORAL | 0 refills | Status: DC | PRN

## 2020-08-24 MED ORDER — HYDROCODONE-ACETAMINOPHEN 5-325 MG PO TABS: 1.0000 | ORAL_TABLET | Freq: Four times a day (QID) | ORAL | 0 refills | Status: DC | PRN

## 2020-08-24 MED ORDER — ONDANSETRON 4 MG PO TBDP
4.0000 mg | ORAL_TABLET | Freq: Once | ORAL | Status: AC
  Administered 2020-08-24: 4 mg via ORAL
  Filled 2020-08-24: qty 1

## 2020-08-24 MED ORDER — MORPHINE SULFATE (PF) 4 MG/ML IV SOLN
4.0000 mg | Freq: Once | INTRAVENOUS | Status: AC
  Administered 2020-08-24: 4 mg via INTRAVENOUS
  Filled 2020-08-24: qty 1

## 2020-08-24 NOTE — ED Provider Notes (Signed)
[facility] Emergency Department Provider Note   ____________________________________________    I have reviewed the triage vital signs and the nursing notes.   HISTORY  Chief Complaint Abdominal Pain     HPI Danielle Harrison is a 44 y.o. female who presents with complaints of abdominal pain, nausea and vomiting which started last night.  Patient reports diffuse stomach cramping and frequent vomiting, one episode of diarrhea, now improved.  She does report taking frequent ibuprofen due to chronic pain.  She has had abdominal cramping and nausea vomiting weakness multiple times which is brought her to the emergency department.  She reports this is similar to her usual symptoms.  Denies daily marijuana use.  No alcohol use reported.  No fevers or chills.  Past Medical History:  Diagnosis Date  . Anxiety   . Arthritis    back  . Asthma   . Neuromuscular disorder (HCC)    weakness and numbness   . Restless leg syndrome     Patient Active Problem List   Diagnosis Date Noted  . Stomach ache   . Diarrhea   . Stomach irritation     Past Surgical History:  Procedure Laterality Date  . BIOPSY  06/28/2019   Procedure: BIOPSY;  Surgeon: Pasty Spillers, MD;  Location: [facility];  Service: Endoscopy;;  . CESAREAN SECTION     x2  . COLONOSCOPY WITH PROPOFOL N/A 06/28/2019   Procedure: COLONOSCOPY WITH PROPOFOL;  Surgeon: Pasty Spillers, MD;  Location: [facility];  Service: Endoscopy;  Laterality: N/A;  . ESOPHAGOGASTRODUODENOSCOPY (EGD) WITH PROPOFOL N/A 06/28/2019   Procedure: ESOPHAGOGASTRODUODENOSCOPY (EGD) WITH PROPOFOL;  Surgeon: Pasty Spillers, MD;  Location: [facility];  Service: Endoscopy;  Laterality: N/A;    Prior to Admission medications   Medication Sig Start Date End Date Taking? Authorizing Provider  albuterol (PROVENTIL) (2.5 MG/3ML) 0.083% nebulizer solution USE CONTENTS IN NEBULIZER UP TO  THREE TIMES DAILY AS NEEDED 01/03/20   [provider]  albuterol (VENTOLIN HFA) 108 (90 Base) MCG/ACT inhaler SMARTSIG:1 Puff(s) By Mouth Every 6 Hours PRN 12/28/19   [provider]  cyclobenzaprine (FLEXERIL) 10 MG tablet  06/16/19   [provider]  Dexlansoprazole (DEXILANT) 30 MG capsule Take 1 capsule (30 mg total) by mouth daily. 01/11/20   Emily Filbert, MD  diclofenac (VOLTAREN) 75 MG EC tablet Take 75 mg by mouth 2 (two) times daily. 06/10/19   [provider]  DULoxetine (CYMBALTA) 30 MG capsule Take 30 mg by mouth daily. 05/23/15   [provider]  gabapentin (NEURONTIN) 300 MG capsule Take 300 mg by mouth at bedtime. 01/10/20   [provider]  HYDROcodone-acetaminophen (NORCO/VICODIN) 5-325 MG tablet Take 1 tablet by mouth every 6 (six) hours as needed for severe pain. 08/24/20   Jene Every, MD  hydrOXYzine (VISTARIL) 25 MG capsule Take 25 mg by mouth at bedtime.    [provider]  ondansetron (ZOFRAN ODT) 4 MG disintegrating tablet Take 1 tablet (4 mg total) by mouth every 8 (eight) hours as needed. 08/24/20   Jene Every, MD  ondansetron (ZOFRAN) 8 MG tablet Take 8 mg by mouth every 8 (eight) hours as needed. 12/14/19   [provider]  pramipexole (MIRAPEX) 0.25 MG tablet Take 0.25 mg by mouth at bedtime.    [provider]  simvastatin (ZOCOR) 20 MG tablet Take 20 mg by mouth at bedtime. 12/27/19   [provider]  [facility]  160-4.5 MCG/ACT inhaler SMARTSIG:2 Puff(s) By Mouth Twice Daily 01/07/20   [provider]  Vitamin D, Ergocalciferol, (DRISDOL) 1.25 MG (50000 UNIT) CAPS capsule Take 50,000 Units by mouth once a week. 12/21/19   [provider]     Allergies Patient has no known allergies.  Family History  Problem Relation Age of Onset  . Colon cancer Neg Hx     Social History Social History   Tobacco Use  . Smoking status: Current Every Day Smoker     Packs/day: 0.50    Years: 6.00    Pack years: 3.00    Types: Cigarettes  . Smokeless tobacco: Never Used  Substance Use Topics  . Alcohol use: Yes    Comment: occasionally  . Drug use: Not on file    Review of Systems  Constitutional: No fever/chills Eyes: No visual changes.  ENT: No sore throat. Cardiovascular: Denies chest pain. Respiratory: Denies shortness of breath. Gastrointestinal: As above Genitourinary: Negative for dysuria. Musculoskeletal: Negative for back pain. Skin: Negative for rash. Neurological: Negative for headaches    ____________________________________________   PHYSICAL EXAM:  VITAL SIGNS: ED Triage Vitals [08/24/20 1312]  Enc Vitals Group     BP (!) 143/99     Pulse Rate (!) 135     Resp 18     Temp 98.9 F (37.2 C)     Temp Source Oral     SpO2 100 %     Weight 59 kg (130 lb)     Height 1.524 m (5')     Head Circumference      Peak Flow      Pain Score 10     Pain Loc      Pain Edu?      Excl. in GC?     Constitutional: Alert and oriented.   Nose: No congestion/rhinnorhea. Mouth/Throat: Mucous membranes are moist.    Cardiovascular: Normal rate, regular rhythm. Good peripheral circulation. Respiratory: Normal respiratory effort.  No retractions.  Gastrointestinal: Soft and nontender. No distention.  No CVA tenderness.  Reassuring exam  Musculoskeletal: No lower extremity tenderness nor edema.  Warm and well perfused Neurologic:  Normal speech and language. No gross focal neurologic deficits are appreciated.  Skin:  Skin is warm, dry and intact. No rash noted. Psychiatric: Mood and affect are normal. Speech and behavior are normal.  ____________________________________________   LABS (all labs ordered are listed, but only abnormal results are displayed)  Labs Reviewed  LIPASE, BLOOD - Abnormal; Notable for the following components:      Result Value   Lipase 89 (*)    All other components within normal limits    COMPREHENSIVE METABOLIC PANEL - Abnormal; Notable for the following components:   Potassium 3.4 (*)    CO2 17 (*)    Glucose, Bld 188 (*)    Total Protein 8.2 (*)    All other components within normal limits  CBC - Abnormal; Notable for the following components:   WBC 10.8 (*)    Hemoglobin 15.8 (*)    MCH 34.7 (*)    All other components within normal limits  URINALYSIS, COMPLETE (UACMP) WITH MICROSCOPIC - Abnormal; Notable for the following components:   Color, Urine YELLOW (*)    APPearance CLOUDY (*)    Hgb urine dipstick LARGE (*)    Protein, ur 100 (*)    Bacteria, UA RARE (*)    All other components within normal limits  POC URINE PREG, ED   ____________________________________________  EKG  None ____________________________________________  RADIOLOGY   ____________________________________________   PROCEDURES  Procedure(s) performed: No  Procedures   Critical Care performed: No ____________________________________________   INITIAL IMPRESSION / ASSESSMENT AND PLAN / ED COURSE  Pertinent labs & imaging results that were available during my care of the patient were reviewed by me and considered in my medical decision making (see chart for details).  Patient presents with abdominal pain, nausea vomiting as noted above.  Differential includes gastritis, pancreatitis, PUD, cyclical vomiting  We will give IV fluids, IV Zofran, IV morphine.  Check labs and reevaluate  Lab work is overall reassuring, mild elevation of white blood cell count is nonspecific, minimally elevated lipase noted as well.  Patient is currently menstruating, hence the hemoglobin in her urine.  ----------------------------------------- 4:53 PM on 08/24/2020 -----------------------------------------  Patient's heart rate is improved, she is feeling much better sitting up, asking to leave.  Appropriate discharge at this time, will Rx small amount of pain medication, nausea  medication.  Return cautions discussed.    ____________________________________________   FINAL CLINICAL IMPRESSION(S) / ED DIAGNOSES  Final diagnoses:  Acute gastritis without hemorrhage, unspecified gastritis type        Note:  This document was prepared using Dragon voice recognition software and may include unintentional dictation errors.   Jene Every, MD 08/24/20 [phone]

## 2020-08-24 NOTE — ED Triage Notes (Signed)
Pt comes into the ED via POV c/o generalized abdominal pain and nausea/emesis.  Pt states this has been ongoing for about 12 hours.  Pt in NAD at this time with even and unlabored respirations. Denies any CP, SHOB, diarrhea, etc.

## 2020-08-24 NOTE — ED Notes (Signed)
Pt resting in stretcher. Pt states pain has decreased and appears to be more comfortable. Pt able to lay back in stretcher.  Call bell within reach. No other needs expressed at this time.

## 2021-02-27 ENCOUNTER — Other Ambulatory Visit: Payer: Self-pay

## 2021-02-27 ENCOUNTER — Emergency Department: Payer: 59

## 2021-02-27 ENCOUNTER — Inpatient Hospital Stay
Admission: EM | Admit: 2021-02-27 | Discharge: 2021-03-02 | DRG: 440 | Disposition: A | Discharge status: Home / Self Care | Payer: 59 | Attending: Internal Medicine | Admitting: Internal Medicine

## 2021-02-27 DIAGNOSIS — Z79899 Other long term (current) drug therapy: Secondary | ICD-10-CM

## 2021-02-27 DIAGNOSIS — E876 Hypokalemia: Secondary | ICD-10-CM | POA: Diagnosis present

## 2021-02-27 DIAGNOSIS — Z7951 Long term (current) use of inhaled steroids: Secondary | ICD-10-CM

## 2021-02-27 DIAGNOSIS — F17210 Nicotine dependence, cigarettes, uncomplicated: Secondary | ICD-10-CM | POA: Diagnosis present

## 2021-02-27 DIAGNOSIS — K859 Acute pancreatitis without necrosis or infection, unspecified: Principal | ICD-10-CM

## 2021-02-27 DIAGNOSIS — I10 Essential (primary) hypertension: Secondary | ICD-10-CM | POA: Diagnosis present

## 2021-02-27 DIAGNOSIS — G2581 Restless legs syndrome: Secondary | ICD-10-CM | POA: Diagnosis present

## 2021-02-27 DIAGNOSIS — G8929 Other chronic pain: Secondary | ICD-10-CM | POA: Diagnosis present

## 2021-02-27 DIAGNOSIS — Z20822 Contact with and (suspected) exposure to covid-19: Secondary | ICD-10-CM | POA: Diagnosis present

## 2021-02-27 DIAGNOSIS — E785 Hyperlipidemia, unspecified: Secondary | ICD-10-CM | POA: Diagnosis present

## 2021-02-27 DIAGNOSIS — Z79891 Long term (current) use of opiate analgesic: Secondary | ICD-10-CM

## 2021-02-27 DIAGNOSIS — K219 Gastro-esophageal reflux disease without esophagitis: Secondary | ICD-10-CM | POA: Diagnosis present

## 2021-02-27 DIAGNOSIS — F419 Anxiety disorder, unspecified: Secondary | ICD-10-CM | POA: Diagnosis present

## 2021-02-27 DIAGNOSIS — K5900 Constipation, unspecified: Secondary | ICD-10-CM | POA: Diagnosis not present

## 2021-02-27 DIAGNOSIS — J45909 Unspecified asthma, uncomplicated: Secondary | ICD-10-CM

## 2021-02-27 LAB — COMPREHENSIVE METABOLIC PANEL
ALT: 12 U/L (ref 0–44)
AST: 18 U/L (ref 15–41)
Albumin: 4.1 g/dL (ref 3.5–5.0)
Alkaline Phosphatase: 81 U/L (ref 38–126)
Anion gap: 10 (ref 5–15)
BUN: 11 mg/dL (ref 6–20)
CO2: 21 mmol/L — ABNORMAL LOW (ref 22–32)
Calcium: 9.1 mg/dL (ref 8.9–10.3)
Chloride: 100 mmol/L (ref 98–111)
Creatinine, Ser: 0.58 mg/dL (ref 0.44–1.00)
GFR, Estimated: >60 mL/min (ref >60)
Glucose, Bld: 126 mg/dL — ABNORMAL HIGH (ref 70–99)
Potassium: 3 mmol/L — ABNORMAL LOW (ref 3.5–5.1)
Sodium: 131 mmol/L — ABNORMAL LOW (ref 135–145)
Total Bilirubin: 0.8 mg/dL (ref 0.3–1.2)
Total Protein: 7.4 g/dL (ref 6.5–8.1)

## 2021-02-27 LAB — CBC WITH DIFFERENTIAL/PLATELET
Abs Immature Granulocytes: 0.06 10*3/uL (ref 0.00–0.07)
Basophils Absolute: 0 10*3/uL (ref 0.0–0.1)
Basophils Relative: 0 %
Eosinophils Absolute: 0.1 10*3/uL (ref 0.0–0.5)
Eosinophils Relative: 1 %
HCT: 39.3 % (ref 36.0–46.0)
Hemoglobin: 13.6 g/dL (ref 12.0–15.0)
Immature Granulocytes: 1 %
Lymphocytes Relative: 7 %
Lymphs Abs: 0.8 10*3/uL (ref 0.7–4.0)
MCH: 36.6 pg — ABNORMAL HIGH (ref 26.0–34.0)
MCHC: 34.6 g/dL (ref 30.0–36.0)
MCV: 105.6 fL — ABNORMAL HIGH (ref 80.0–100.0)
Monocytes Absolute: 0.8 10*3/uL (ref 0.1–1.0)
Monocytes Relative: 7 %
Neutro Abs: 9.3 10*3/uL — ABNORMAL HIGH (ref 1.7–7.7)
Neutrophils Relative %: 84 %
Platelets: 243 10*3/uL (ref 150–400)
RBC: 3.72 MIL/uL — ABNORMAL LOW (ref 3.87–5.11)
RDW: 12.7 % (ref 11.5–15.5)
WBC: 11 10*3/uL — ABNORMAL HIGH (ref 4.0–10.5)
nRBC: 0 % (ref 0.0–0.2)

## 2021-02-27 LAB — TROPONIN I (HIGH SENSITIVITY)
Troponin I (High Sensitivity): 2 ng/L (ref <18)
Troponin I (High Sensitivity): 2 ng/L (ref <18)

## 2021-02-27 LAB — RESP PANEL BY RT-PCR (FLU A&B, COVID) ARPGX2
Influenza A by PCR: NEGATIVE
Influenza B by PCR: NEGATIVE
SARS Coronavirus 2 by RT PCR: NEGATIVE

## 2021-02-27 LAB — URINALYSIS, ROUTINE W REFLEX MICROSCOPIC
Bacteria, UA: NONE SEEN
Bilirubin Urine: NEGATIVE
Glucose, UA: NEGATIVE mg/dL
Ketones, ur: NEGATIVE mg/dL
Leukocytes,Ua: NEGATIVE
Nitrite: NEGATIVE
Protein, ur: NEGATIVE mg/dL
Specific Gravity, Urine: 1.004 — ABNORMAL LOW (ref 1.005–1.030)
pH: 6 (ref 5.0–8.0)

## 2021-02-27 LAB — POC URINE PREG, ED: Preg Test, Ur: NEGATIVE

## 2021-02-27 LAB — MAGNESIUM: Magnesium: 1.8 mg/dL (ref 1.7–2.4)

## 2021-02-27 LAB — LIPASE, BLOOD: Lipase: 359 U/L — ABNORMAL HIGH (ref 11–51)

## 2021-02-27 MED ORDER — SODIUM CHLORIDE 0.9 % IV BOLUS
1000.0000 mL | Freq: Once | INTRAVENOUS | Status: AC
  Administered 2021-02-27: 1000 mL via INTRAVENOUS

## 2021-02-27 MED ORDER — ENOXAPARIN SODIUM 40 MG/0.4ML IJ SOSY
40.0000 mg | PREFILLED_SYRINGE | INTRAMUSCULAR | Status: DC
  Administered 2021-02-27 – 2021-03-01 (×3): 40 mg via SUBCUTANEOUS
  Filled 2021-02-27 (×3): qty 0.4

## 2021-02-27 MED ORDER — ACETAMINOPHEN 325 MG PO TABS: 650.0000 mg | ORAL_TABLET | Freq: Four times a day (QID) | ORAL | Status: DC | PRN

## 2021-02-27 MED ORDER — POTASSIUM CHLORIDE 10 MEQ/100ML IV SOLN
10.0000 meq | INTRAVENOUS | Status: AC
  Administered 2021-02-27 (×2): 10 meq via INTRAVENOUS
  Filled 2021-02-27 (×2): qty 100

## 2021-02-27 MED ORDER — ONDANSETRON HCL 4 MG/2ML IJ SOLN
4.0000 mg | Freq: Once | INTRAMUSCULAR | Status: AC
  Administered 2021-02-27: 4 mg via INTRAVENOUS
  Filled 2021-02-27: qty 2

## 2021-02-27 MED ORDER — HYDROMORPHONE HCL 1 MG/ML IJ SOLN
0.5000 mg | Freq: Once | INTRAMUSCULAR | Status: AC
  Administered 2021-02-27: 0.5 mg via INTRAVENOUS
  Filled 2021-02-27: qty 1

## 2021-02-27 MED ORDER — SODIUM CHLORIDE 0.9 % IV SOLN: INTRAVENOUS | Status: DC

## 2021-02-27 MED ORDER — HYDROCODONE-ACETAMINOPHEN 5-325 MG PO TABS
1.0000 | ORAL_TABLET | ORAL | Status: DC | PRN
  Administered 2021-02-28 (×2): 2 via ORAL
  Filled 2021-02-27 (×2): qty 2

## 2021-02-27 MED ORDER — HYDROMORPHONE HCL 1 MG/ML IJ SOLN
1.0000 mg | Freq: Once | INTRAMUSCULAR | Status: AC
  Administered 2021-02-27: 1 mg via INTRAVENOUS
  Filled 2021-02-27: qty 1

## 2021-02-27 MED ORDER — MORPHINE SULFATE (PF) 4 MG/ML IV SOLN
4.0000 mg | Freq: Once | INTRAVENOUS | Status: AC
  Administered 2021-02-27: 4 mg via INTRAVENOUS
  Filled 2021-02-27: qty 1

## 2021-02-27 MED ORDER — MORPHINE SULFATE (PF) 2 MG/ML IV SOLN
2.0000 mg | INTRAVENOUS | Status: DC | PRN
  Administered 2021-02-27 (×2): 2 mg via INTRAVENOUS
  Filled 2021-02-27 (×2): qty 1

## 2021-02-27 MED ORDER — ONDANSETRON HCL 4 MG PO TABS: 4.0000 mg | ORAL_TABLET | Freq: Four times a day (QID) | ORAL | Status: DC | PRN

## 2021-02-27 MED ORDER — ONDANSETRON HCL 4 MG/2ML IJ SOLN: 4.0000 mg | Freq: Four times a day (QID) | INTRAMUSCULAR | Status: DC | PRN

## 2021-02-27 MED ORDER — PANTOPRAZOLE SODIUM 40 MG IV SOLR
40.0000 mg | Freq: Once | INTRAVENOUS | Status: AC
  Administered 2021-02-27: 40 mg via INTRAVENOUS
  Filled 2021-02-27: qty 40

## 2021-02-27 MED ORDER — ACETAMINOPHEN 650 MG RE SUPP: 650.0000 mg | Freq: Four times a day (QID) | RECTAL | Status: DC | PRN

## 2021-02-27 MED ORDER — POTASSIUM CHLORIDE 10 MEQ/100ML IV SOLN: 10.0000 meq | Freq: Once | INTRAVENOUS | Status: DC

## 2021-02-27 NOTE — ED Triage Notes (Signed)
NVD since Thursday, abdominal cramping. Reports feeling dehydrated.

## 2021-02-27 NOTE — H&P (Signed)
History and Physical    Danielle Harrison ZOX:[id number] DOB: [birth year] DOA: 02/27/2021  PCP: [facility]   Patient coming from: Home  I have personally briefly reviewed patient's [facility]  Chief Complaint: Abdominal pain  HPI: Danielle BonnetChristina L Tippens is a 45 y.o. female with medical history significant for Asthma, GERD and chronic pain as well as recurrent abdominal pain, who presents to the ER, with intermittent abdominal pain for the past 5 days which has now become more persistent.  Pain is colicky, located in epigastrium and is now of moderate to severe intensity.  It is unrelieved with over-the-counter's such as Maalox and antacids.  She has associated nausea, nonbloody nonbilious vomiting and has had diarrhea.  She denies fever or chills.  Denies cough, chest pains or shortness of breath.  She denies alcohol or marijuana use.  No affected contacts and no offending foods. ED course: On arrival, BP 163/117, pulse 124.  Afebrile.  O2 sat 100% on room air Blood work significant for leukocytosis of 11,000, lipase 359, potassium of 3, normal troponins, negative pregnancy test and unremarkable urinalysis. EKG, personally viewed and interpreted: Sinus tachycardia at 106 with no acute ST-T wave changes Imaging: CT abdomen and pelvis with hazy stranding centered predominantly about the pancreas ...suggesting acute interstitial edematous pancreatitis  Patient received repeated doses of morphine in the emergency room but continued to have pain.  Hospitalist consulted for admission.  Review of Systems: As per HPI otherwise all other systems on review of systems negative.    Past Medical History:  Diagnosis Date  . Anxiety   . Arthritis    back  . Asthma   . Neuromuscular disorder (HCC)    weakness and numbness   . Restless leg syndrome     Past Surgical History:  Procedure Laterality Date  . BIOPSY  06/28/2019   Procedure: BIOPSY;  Surgeon: Pasty Spillersahiliani,  Varnita B, MD;  [facility];  Service: Endoscopy;;  . CESAREAN SECTION     x2  . COLONOSCOPY WITH PROPOFOL N/A 06/28/2019   Procedure: COLONOSCOPY WITH PROPOFOL;  Surgeon: Pasty Spillersahiliani, Varnita B, MD;  [facility];  Service: Endoscopy;  Laterality: N/A;  . ESOPHAGOGASTRODUODENOSCOPY (EGD) WITH PROPOFOL N/A 06/28/2019   Procedure: ESOPHAGOGASTRODUODENOSCOPY (EGD) WITH PROPOFOL;  Surgeon: Pasty Spillersahiliani, Varnita B, MD;  [facility];  Service: Endoscopy;  Laterality: N/A;     reports that she has been smoking cigarettes. She has a 3.00 pack-year smoking history. She has never used smokeless tobacco. She reports current alcohol use. No history on file for drug use.  No Known Allergies  Family History  Problem Relation Age of Onset  . Colon cancer Neg Hx       Prior to Admission medications   Medication Sig Start Date End Date Taking? Authorizing Provider  albuterol (PROVENTIL) (2.5 MG/3ML) 0.083% nebulizer solution USE CONTENTS IN NEBULIZER UP TO THREE TIMES DAILY AS NEEDED 01/03/20   [provider]  albuterol (VENTOLIN HFA) 108 (90 Base) MCG/ACT inhaler SMARTSIG:1 Puff(s) By Mouth Every 6 Hours PRN 12/28/19   [provider]  cyclobenzaprine (FLEXERIL) 10 MG tablet  06/16/19   [provider]  Dexlansoprazole (DEXILANT) 30 MG capsule Take 1 capsule (30 mg total) by mouth daily. 01/11/20   Emily FilbertWilliams, Jonathan E, MD  diclofenac (VOLTAREN) 75 MG EC tablet Take 75 mg by mouth 2 (two) times daily. 06/10/19   [provider]  DULoxetine (CYMBALTA) 30 MG capsule  Take 30 mg by mouth daily. 05/23/15   [provider]  gabapentin (NEURONTIN) 300 MG capsule Take 300 mg by mouth at bedtime. 01/10/20   [provider]  HYDROcodone-acetaminophen (NORCO/VICODIN) 5-325 MG tablet Take 1 tablet by mouth every 6 (six) hours as needed for severe pain. 08/24/20   Jene Every, MD  hydrOXYzine (VISTARIL) 25 MG capsule Take  25 mg by mouth at bedtime.    [provider]  ondansetron (ZOFRAN ODT) 4 MG disintegrating tablet Take 1 tablet (4 mg total) by mouth every 8 (eight) hours as needed. 08/24/20   Jene Every, MD  ondansetron (ZOFRAN) 8 MG tablet Take 8 mg by mouth every 8 (eight) hours as needed. 12/14/19   [provider]  pramipexole (MIRAPEX) 0.25 MG tablet Take 0.25 mg by mouth at bedtime.    [provider]  simvastatin (ZOCOR) 20 MG tablet Take 20 mg by mouth at bedtime. 12/27/19   [provider]  SYMBICORT 160-4.5 MCG/ACT inhaler SMARTSIG:2 Puff(s) By Mouth Twice Daily 01/07/20   [provider]  Vitamin D, Ergocalciferol, (DRISDOL) 1.25 MG (50000 UNIT) CAPS capsule Take 50,000 Units by mouth once a week. 12/21/19   [provider]    Physical Exam: Vitals:   02/27/21 1630 02/27/21 1730 02/27/21 1800 02/27/21 1830  BP: (!) 177/117 (!) 188/109 (!) 198/118 (!) 123/107  Pulse: (!) 111 93 96 (!) 109  Resp:  16 19 (!) 22  Temp:      TempSrc:      SpO2: 100% 100% 100% 99%  Weight:      Height:         Vitals:   02/27/21 1630 02/27/21 1730 02/27/21 1800 02/27/21 1830  BP: (!) 177/117 (!) 188/109 (!) 198/118 (!) 123/107  Pulse: (!) 111 93 96 (!) 109  Resp:  16 19 (!) 22  Temp:      TempSrc:      SpO2: 100% 100% 100% 99%  Weight:      Height:          Constitutional: Alert and oriented x 3 .  In pain discomfort  HEENT:      Head: Normocephalic and atraumatic.         Eyes: PERLA, EOMI, Conjunctivae are normal. Sclera is non-icteric.       Mouth/Throat: Mucous membranes are moist.       Neck: Supple with no signs of meningismus. Cardiovascular: Regular rate and rhythm. No murmurs, gallops, or rubs. 2+ symmetrical distal pulses are present . No JVD. No LE edema Respiratory: Respiratory effort normal .Lungs sounds clear bilaterally. No wheezes, crackles, or rhonchi.  Gastrointestinal: Soft, tender in epigastrium, and non distended with  positive bowel sounds.  Genitourinary: No CVA tenderness. Musculoskeletal: Nontender with normal range of motion in all extremities. No cyanosis, or erythema of extremities. Neurologic:  Face is symmetric. Moving all extremities. No gross focal neurologic deficits . Skin: Skin is warm, dry.  No rash or ulcers Psychiatric: Mood and affect are normal    Labs on Admission: I have personally reviewed following labs and imaging studies  CBC: Recent Labs  Lab 02/27/21 1648  WBC 11.0*  NEUTROABS 9.3*  HGB 13.6  HCT 39.3  MCV 105.6*  PLT 243   Basic Metabolic Panel: Recent Labs  Lab 02/27/21 1648 02/27/21 1813  NA 131*  --   K 3.0*  --   CL 100  --   CO2 21*  --   GLUCOSE 126*  --  BUN 11  --   CREATININE 0.58  --   CALCIUM 9.1  --   MG  --  1.8   GFR: Estimated Creatinine Clearance: 70 mL/min (by C-G formula based on SCr of 0.58 mg/dL). Liver Function Tests: Recent Labs  Lab 02/27/21 1648  AST 18  ALT 12  ALKPHOS 81  BILITOT 0.8  PROT 7.4  ALBUMIN 4.1   Recent Labs  Lab 02/27/21 1648  LIPASE 359*   No results for input(s): AMMONIA in the last 168 hours. Coagulation Profile: No results for input(s): INR, PROTIME in the last 168 hours. Cardiac Enzymes: No results for input(s): CKTOTAL, CKMB, CKMBINDEX, TROPONINI in the last 168 hours. BNP (last 3 results) No results for input(s): PROBNP in the last 8760 hours. HbA1C: No results for input(s): HGBA1C in the last 72 hours. CBG: No results for input(s): GLUCAP in the last 168 hours. Lipid Profile: No results for input(s): CHOL, HDL, LDLCALC, TRIG, CHOLHDL, LDLDIRECT in the last 72 hours. Thyroid Function Tests: No results for input(s): TSH, T4TOTAL, FREET4, T3FREE, THYROIDAB in the last 72 hours. Anemia Panel: No results for input(s): VITAMINB12, FOLATE, FERRITIN, TIBC, IRON, RETICCTPCT in the last 72 hours. Urine analysis:    Component Value Date/Time   COLORURINE STRAW (A) 02/27/2021 1757    APPEARANCEUR CLEAR (A) 02/27/2021 1757   APPEARANCEUR Clear 03/01/2014 1301   LABSPEC 1.004 (L) 02/27/2021 1757   LABSPEC 1.012 03/01/2014 1301   PHURINE 6.0 02/27/2021 1757   GLUCOSEU NEGATIVE 02/27/2021 1757   GLUCOSEU 50 mg/dL 62/13/0865 7846   HGBUR LARGE (A) 02/27/2021 1757   BILIRUBINUR NEGATIVE 02/27/2021 1757   BILIRUBINUR Negative 03/01/2014 1301   KETONESUR NEGATIVE 02/27/2021 1757   PROTEINUR NEGATIVE 02/27/2021 1757   NITRITE NEGATIVE 02/27/2021 1757   LEUKOCYTESUR NEGATIVE 02/27/2021 1757   LEUKOCYTESUR Negative 03/01/2014 1301    Radiological Exams on Admission: CT ABDOMEN PELVIS WO CONTRAST  Result Date: 02/27/2021 CLINICAL DATA:  Abdominal distension, nausea, vomiting EXAM: CT ABDOMEN AND PELVIS WITHOUT CONTRAST TECHNIQUE: Multidetector CT imaging of the abdomen and pelvis was performed following the standard protocol without IV contrast. COMPARISON:  CT 06/26/2019 FINDINGS: Lower chest: Lung bases are clear. Normal heart size. No pericardial effusion. Hepatobiliary: No worrisome focal liver abnormality is seen. Normal gallbladder. No visible calcified gallstones. No biliary ductal dilatation. Pancreas: Hazy baby pancreatic stranding is noted diffusely with a slightly edematous appearance of the pancreatic parenchyma. No organized collection is seen. No discernible pancreatic mass or ductal dilatation. Spleen: Normal in size. No concerning splenic lesions. Adrenals/Urinary Tract: Normal adrenal glands. No visible or contour deforming renal lesions. No urolithiasis or hydronephrosis. Urinary bladder is unremarkable for the degree of distention. Stomach/Bowel: Distal esophagus and stomach are unremarkable for degree of distension. Some stranding about the duodenum without significant mural thickening, favored to be secondary to the pancreatic process. No other small bowel thickening dilatation or evidence of obstruction. Normal-appearing noninflamed appendix in the right lower  quadrant. Some stranding about the descending colon may be redistributed though slight mural thickening is noted with intramural fatty infiltration. Could be accentuated by underdistention with colitis less favored though not completely excluded. Vascular/Lymphatic: No significant vascular findings are present. No enlarged abdominal or pelvic lymph nodes. Reproductive: Anteverted uterus. Few low-attenuation foci towards the cervix, likely nabothian cysts. Radiolucent tampon in the vaginal canal. No concerning adnexal lesions. Other: Small volume low-attenuation fluid in the deep pelvis, may be redistributed/reactive. Stranding about pancreas as well. No free air. No organized abscess or collection.  Postsurgical changes from remote appearing low vertical midline incision. Musculoskeletal: Multilevel degenerative changes are present in the imaged portions of the spine. Grade 2 anterolisthesis L4 on L5 without spondylolysis. Mild degenerative changes in the hips and pelvis. No acute osseous abnormality or suspicious osseous lesion. IMPRESSION: Hazy stranding centered predominantly about the pancreas with some likely redistribution in the retroperitoneum and left pericolic gutter. Appearance could suggest acute interstitial edematous pancreatitis. Recommend correlation with lipase. Some intramural fatty infiltration noted within the colon, particularly the descending segment. Thickened appearance of the proximal descending with adjacent stranding favored to be incidental decompression and redistribute fluid with active colitis less favored. Aortic Atherosclerosis (ICD10-I70.0). Electronically Signed   By: Kreg Shropshire M.D.   On: 02/27/2021 18:50     Assessment/Plan 45 year old female with history of asthma, GERD and chronic pain as well as recurrent abdominal pain, presenting with abdominal pain, vomiting .      Acute pancreatitis -Denies alcohol use - Lipase 359 and CT abdomen and pelvis consistent with acute  pancreatitis.  LFTs WNL - Follow triglycerides - Clear liquid diet - IV pain meds, antiemetics and supportive care    Chronic pain - Continue home Neurontin hydrocodone and Flexeril    Asthma - Not acutely exacerbated - Ventolin as needed    DVT prophylaxis: Lovenox  Code Status: full code  Family Communication:  none  Disposition Plan: Back to previous home environment Consults called: none  Status: Observation    Andris Baumann MD [facility]     02/27/2021, 7:35 PM

## 2021-02-27 NOTE — ED Provider Notes (Signed)
[facility] Emergency Department Provider Note  ____________________________________________   Event Date/Time   First MD Initiated Contact with Patient 02/27/21 1605     (approximate)  I have reviewed the triage vital signs and the nursing notes.   HISTORY  Chief Complaint Emesis and Diarrhea    HPI Danielle Harrison is a 45 y.o. female with chronic back pain on opioids who comes in for abdominal pain.  Patient reports having abdominal pain this been going on since Thursday.  She reports it is in her upper abdomen associate with some nausea and vomiting and diarrhea.  Patient states that she has been taking some over-the-counter medications including Maalox, gas medicine without improvement of her symptoms.  Unclear what brought it on.  Pain has been pretty constant.  She states that she has been here to the ER multiple times for similar has had reassuring work-ups and she is not quite sure why she gets these episodes.  She has been followed by GI and had a normal endoscopy.  She denies alcohol or marijuana use.  Denies any fevers.  Denies anyone else being sick at home.  Patient does not have her COVID vaccines.          Past Medical History:  Diagnosis Date  . Anxiety   . Arthritis    back  . Asthma   . Neuromuscular disorder (HCC)    weakness and numbness   . Restless leg syndrome     Patient Active Problem List   Diagnosis Date Noted  . Stomach ache   . Diarrhea   . Stomach irritation     Past Surgical History:  Procedure Laterality Date  . BIOPSY  06/28/2019   Procedure: BIOPSY;  Surgeon: Pasty Spillers, MD;  Location: [facility];  Service: Endoscopy;;  . CESAREAN SECTION     x2  . COLONOSCOPY WITH PROPOFOL N/A 06/28/2019   Procedure: COLONOSCOPY WITH PROPOFOL;  Surgeon: Pasty Spillers, MD;  Location: [facility];  Service: Endoscopy;  Laterality: N/A;  . ESOPHAGOGASTRODUODENOSCOPY (EGD) WITH PROPOFOL N/A  06/28/2019   Procedure: ESOPHAGOGASTRODUODENOSCOPY (EGD) WITH PROPOFOL;  Surgeon: Pasty Spillers, MD;  Location: [facility];  Service: Endoscopy;  Laterality: N/A;    Prior to Admission medications   Medication Sig Start Date End Date Taking? Authorizing Provider  albuterol (PROVENTIL) (2.5 MG/3ML) 0.083% nebulizer solution USE CONTENTS IN NEBULIZER UP TO THREE TIMES DAILY AS NEEDED 01/03/20   [provider]  albuterol (VENTOLIN HFA) 108 (90 Base) MCG/ACT inhaler SMARTSIG:1 Puff(s) By Mouth Every 6 Hours PRN 12/28/19   [provider]  cyclobenzaprine (FLEXERIL) 10 MG tablet  06/16/19   [provider]  Dexlansoprazole (DEXILANT) 30 MG capsule Take 1 capsule (30 mg total) by mouth daily. 01/11/20   Emily Filbert, MD  diclofenac (VOLTAREN) 75 MG EC tablet Take 75 mg by mouth 2 (two) times daily. 06/10/19   [provider]  DULoxetine (CYMBALTA) 30 MG capsule Take 30 mg by mouth daily. 05/23/15   [provider]  gabapentin (NEURONTIN) 300 MG capsule Take 300 mg by mouth at bedtime. 01/10/20   [provider]  HYDROcodone-acetaminophen (NORCO/VICODIN) 5-325 MG tablet Take 1 tablet by mouth every 6 (six) hours as needed for severe pain. 08/24/20   Jene Every, MD  hydrOXYzine (VISTARIL) 25 MG capsule Take 25 mg by mouth at bedtime.    [provider]  ondansetron (ZOFRAN ODT) 4 MG disintegrating tablet Take 1 tablet (  4 mg total) by mouth every 8 (eight) hours as needed. 08/24/20   Jene EveryKinner, Robert, MD  ondansetron (ZOFRAN) 8 MG tablet Take 8 mg by mouth every 8 (eight) hours as needed. 12/14/19   [provider]  pramipexole (MIRAPEX) 0.25 MG tablet Take 0.25 mg by mouth at bedtime.    [provider]  simvastatin (ZOCOR) 20 MG tablet Take 20 mg by mouth at bedtime. 12/27/19   [provider]  SYMBICORT 160-4.5 MCG/ACT inhaler SMARTSIG:2 Puff(s) By Mouth Twice Daily 01/07/20   [provider]   Vitamin D, Ergocalciferol, (DRISDOL) 1.25 MG (50000 UNIT) CAPS capsule Take 50,000 Units by mouth once a week. 12/21/19   [provider]    Allergies Patient has no known allergies.  Family History  Problem Relation Age of Onset  . Colon cancer Neg Hx     Social History Social History   Tobacco Use  . Smoking status: Current Every Day Smoker    Packs/day: 0.50    Years: 6.00    Pack years: 3.00    Types: Cigarettes  . Smokeless tobacco: Never Used  Substance Use Topics  . Alcohol use: Yes    Comment: occasionally      Review of Systems Constitutional: No fever/chills Eyes: No visual changes. ENT: No sore throat. Cardiovascular: Denies chest pain. Respiratory: Denies shortness of breath. Gastrointestinal: Upper abdominal pain, nausea, vomiting, diarrhea Genitourinary: Negative for dysuria. Musculoskeletal: Negative for back pain. Skin: Negative for rash. Neurological: Negative for headaches, focal weakness or numbness. All other ROS negative ____________________________________________   PHYSICAL EXAM:  VITAL SIGNS: ED Triage Vitals  Enc Vitals Group     BP 02/27/21 1600 (!) 163/117     Pulse Rate 02/27/21 1600 (!) 124     Resp 02/27/21 1600 16     Temp 02/27/21 1600 98.9 F (37.2 C)     Temp Source 02/27/21 1600 Oral     SpO2 02/27/21 1600 100 %     Weight 02/27/21 1601 122 lb (55.3 kg)     Height 02/27/21 1601 5' (1.524 m)     Head Circumference --      Peak Flow --      Pain Score 02/27/21 1601 10     Pain Loc --      Pain Edu? --      Excl. in GC? --     Constitutional: Alert and oriented. Well appearing and in no acute distress. Eyes: Conjunctivae are normal. EOMI. Head: Atraumatic. Nose: No congestion/rhinnorhea. Mouth/Throat: Mucous membranes are moist.   Neck: No stridor. Trachea Midline. FROM Cardiovascular: Normal rate, regular rhythm. Grossly normal heart sounds.  Good peripheral circulation. Respiratory: Normal respiratory  effort.  No retractions. Lungs CTAB. Gastrointestinal: Soft and nontender but patient reports pain in the upper abdomen without any rebound or guarding. No distention. No abdominal bruits.  Musculoskeletal: No lower extremity tenderness nor edema.  No joint effusions. Neurologic:  Normal speech and language. No gross focal neurologic deficits are appreciated.  Skin:  Skin is warm, dry and intact. No rash noted. Psychiatric: Mood and affect are normal. Speech and behavior are normal. GU: Deferred   ____________________________________________   LABS (all labs ordered are listed, but only abnormal results are displayed)  Labs Reviewed  RESP PANEL BY RT-PCR (FLU A&B, COVID) ARPGX2  CBC WITH DIFFERENTIAL/PLATELET  COMPREHENSIVE METABOLIC PANEL  LIPASE, BLOOD  URINALYSIS, ROUTINE W REFLEX MICROSCOPIC  POC URINE PREG, ED  TROPONIN I (HIGH SENSITIVITY)   ____________________________________________  ED ECG REPORT I, Concha Se, the attending physician, personally viewed and interpreted this ECG.  Sinus tachycardia rate of 106, no ST elevation, T wave inversion in lead III, normal intervals ____________________________________________  RADIOLOGY   Official radiology report(s): CT ABDOMEN PELVIS WO CONTRAST  Result Date: 02/27/2021 CLINICAL DATA:  Abdominal distension, nausea, vomiting EXAM: CT ABDOMEN AND PELVIS WITHOUT CONTRAST TECHNIQUE: Multidetector CT imaging of the abdomen and pelvis was performed following the standard protocol without IV contrast. COMPARISON:  CT 06/26/2019 FINDINGS: Lower chest: Lung bases are clear. Normal heart size. No pericardial effusion. Hepatobiliary: No worrisome focal liver abnormality is seen. Normal gallbladder. No visible calcified gallstones. No biliary ductal dilatation. Pancreas: Hazy baby pancreatic stranding is noted diffusely with a slightly edematous appearance of the pancreatic parenchyma. No organized collection is seen. No discernible  pancreatic mass or ductal dilatation. Spleen: Normal in size. No concerning splenic lesions. Adrenals/Urinary Tract: Normal adrenal glands. No visible or contour deforming renal lesions. No urolithiasis or hydronephrosis. Urinary bladder is unremarkable for the degree of distention. Stomach/Bowel: Distal esophagus and stomach are unremarkable for degree of distension. Some stranding about the duodenum without significant mural thickening, favored to be secondary to the pancreatic process. No other small bowel thickening dilatation or evidence of obstruction. Normal-appearing noninflamed appendix in the right lower quadrant. Some stranding about the descending colon may be redistributed though slight mural thickening is noted with intramural fatty infiltration. Could be accentuated by underdistention with colitis less favored though not completely excluded. Vascular/Lymphatic: No significant vascular findings are present. No enlarged abdominal or pelvic lymph nodes. Reproductive: Anteverted uterus. Few low-attenuation foci towards the cervix, likely nabothian cysts. Radiolucent tampon in the vaginal canal. No concerning adnexal lesions. Other: Small volume low-attenuation fluid in the deep pelvis, may be redistributed/reactive. Stranding about pancreas as well. No free air. No organized abscess or collection. Postsurgical changes from remote appearing low vertical midline incision. Musculoskeletal: Multilevel degenerative changes are present in the imaged portions of the spine. Grade 2 anterolisthesis L4 on L5 without spondylolysis. Mild degenerative changes in the hips and pelvis. No acute osseous abnormality or suspicious osseous lesion. IMPRESSION: Hazy stranding centered predominantly about the pancreas with some likely redistribution in the retroperitoneum and left pericolic gutter. Appearance could suggest acute interstitial edematous pancreatitis. Recommend correlation with lipase. Some intramural fatty  infiltration noted within the colon, particularly the descending segment. Thickened appearance of the proximal descending with adjacent stranding favored to be incidental decompression and redistribute fluid with active colitis less favored. Aortic Atherosclerosis (ICD10-I70.0). Electronically Signed   By: Kreg Shropshire M.D.   On: 02/27/2021 18:50    ____________________________________________   PROCEDURES  Procedure(s) performed (including Critical Care):  .1-3 Lead EKG Interpretation Performed by: Concha Se, MD Authorized by: Concha Se, MD     Interpretation: abnormal     ECG rate:  110s    ECG rate assessment: tachycardic     Rhythm: sinus tachycardia     Ectopy: none     Conduction: normal       ____________________________________________   INITIAL IMPRESSION / ASSESSMENT AND PLAN / ED COURSE  Arda L Miguez was evaluated in Emergency Department on 02/27/2021 for the symptoms described in the history of present illness. She was evaluated in the context of the global COVID-19 pandemic, which necessitated consideration that the patient might be at risk for infection with the SARS-CoV-2 virus that causes COVID-19. Institutional protocols and algorithms that pertain to the evaluation of patients at risk for  COVID-19 are in a state of rapid change based on information released by regulatory bodies including the CDC and federal and state organizations. These policies and algorithms were followed during the patient's care in the ED.    Patient is a well-appearing 45 year old who comes in tachycardic with upper abdominal pain, nausea, vomiting, diarrhea.  Suspect this is most likely gastroenteritis versus pancreatitis versus gastritis.  Denies marijuana use to suggest cyclic vomiting from this.  Denies alcohol use.  We discussed imaging but patient would like to hold off at this time given work-up previously has been negative.  We will set her up with labs and treatment and if  patient symptoms or not improving will consider imaging.  We will keep patient the cardiac monitor due to tachycardia  5:59 PM patient reports continued pain.  We will give another dose of pain medication.  Her lipase is significantly elevated at 359.  Will get CT imaging to make sure no evidence of pancreatic necrosis given how long this has been going on given no prior history of pancreatitis make sure there is no gallstones.  We will replete her potassium.  CT imaging concerning for potential pancreatitis.  I do think that this seems GI.  Has given the patient's pain is located under elevated lipase.  Patient still having significant pain and still tachycardic after multiple doses of IV pain medication as well as fluids.  Will discuss possible team for admission       ____________________________________________   FINAL CLINICAL IMPRESSION(S) / ED DIAGNOSES   Final diagnoses:  Acute pancreatitis, unspecified complication status, unspecified pancreatitis type  Hypokalemia      MEDICATIONS GIVEN DURING THIS VISIT:  Medications  potassium chloride 10 mEq in 100 mL IVPB (10 mEq Intravenous New Bag/Given 02/27/21 1820)  HYDROmorphone (DILAUDID) injection 1 mg (has no administration in time range)  morphine 4 MG/ML injection 4 mg (4 mg Intravenous Given 02/27/21 1651)  ondansetron (ZOFRAN) injection 4 mg (4 mg Intravenous Given 02/27/21 1650)  sodium chloride 0.9 % bolus 1,000 mL (0 mLs Intravenous Stopped 02/27/21 1758)  pantoprazole (PROTONIX) injection 40 mg (40 mg Intravenous Given 02/27/21 1650)  HYDROmorphone (DILAUDID) injection 0.5 mg (0.5 mg Intravenous Given 02/27/21 1814)  sodium chloride 0.9 % bolus 1,000 mL (1,000 mLs Intravenous New Bag/Given 02/27/21 1820)     ED Discharge Orders    None       Note:  This document was prepared using Dragon voice recognition software and may include unintentional dictation errors.   Concha Se, MD 02/27/21 [phone]

## 2021-02-27 NOTE — ED Notes (Signed)
Patient transported to CT 

## 2021-02-28 DIAGNOSIS — G8929 Other chronic pain: Secondary | ICD-10-CM | POA: Diagnosis present

## 2021-02-28 DIAGNOSIS — F419 Anxiety disorder, unspecified: Secondary | ICD-10-CM | POA: Diagnosis present

## 2021-02-28 DIAGNOSIS — I10 Essential (primary) hypertension: Secondary | ICD-10-CM | POA: Diagnosis not present

## 2021-02-28 DIAGNOSIS — Z20822 Contact with and (suspected) exposure to covid-19: Secondary | ICD-10-CM | POA: Diagnosis present

## 2021-02-28 DIAGNOSIS — Z79891 Long term (current) use of opiate analgesic: Secondary | ICD-10-CM | POA: Diagnosis not present

## 2021-02-28 DIAGNOSIS — E785 Hyperlipidemia, unspecified: Secondary | ICD-10-CM | POA: Diagnosis present

## 2021-02-28 DIAGNOSIS — G2581 Restless legs syndrome: Secondary | ICD-10-CM | POA: Diagnosis present

## 2021-02-28 DIAGNOSIS — K859 Acute pancreatitis without necrosis or infection, unspecified: Secondary | ICD-10-CM | POA: Diagnosis not present

## 2021-02-28 DIAGNOSIS — K5900 Constipation, unspecified: Secondary | ICD-10-CM | POA: Diagnosis not present

## 2021-02-28 DIAGNOSIS — E876 Hypokalemia: Secondary | ICD-10-CM | POA: Diagnosis present

## 2021-02-28 DIAGNOSIS — Z79899 Other long term (current) drug therapy: Secondary | ICD-10-CM | POA: Diagnosis not present

## 2021-02-28 DIAGNOSIS — F17210 Nicotine dependence, cigarettes, uncomplicated: Secondary | ICD-10-CM | POA: Diagnosis present

## 2021-02-28 DIAGNOSIS — K219 Gastro-esophageal reflux disease without esophagitis: Secondary | ICD-10-CM | POA: Diagnosis present

## 2021-02-28 DIAGNOSIS — J45909 Unspecified asthma, uncomplicated: Secondary | ICD-10-CM | POA: Diagnosis not present

## 2021-02-28 DIAGNOSIS — Z7951 Long term (current) use of inhaled steroids: Secondary | ICD-10-CM | POA: Diagnosis not present

## 2021-02-28 LAB — COMPREHENSIVE METABOLIC PANEL
ALT: 9 U/L (ref 0–44)
AST: 13 U/L — ABNORMAL LOW (ref 15–41)
Albumin: 3.7 g/dL (ref 3.5–5.0)
Alkaline Phosphatase: 69 U/L (ref 38–126)
Anion gap: 9 (ref 5–15)
BUN: <5 mg/dL — ABNORMAL LOW (ref 6–20)
CO2: 21 mmol/L — ABNORMAL LOW (ref 22–32)
Calcium: 8.9 mg/dL (ref 8.9–10.3)
Chloride: 109 mmol/L (ref 98–111)
Creatinine, Ser: 0.46 mg/dL (ref 0.44–1.00)
GFR, Estimated: >60 mL/min (ref >60)
Glucose, Bld: 102 mg/dL — ABNORMAL HIGH (ref 70–99)
Potassium: 3.4 mmol/L — ABNORMAL LOW (ref 3.5–5.1)
Sodium: 139 mmol/L (ref 135–145)
Total Bilirubin: 0.7 mg/dL (ref 0.3–1.2)
Total Protein: 6.9 g/dL (ref 6.5–8.1)

## 2021-02-28 LAB — CBC
HCT: 37.3 % (ref 36.0–46.0)
Hemoglobin: 12.9 g/dL (ref 12.0–15.0)
MCH: 36.3 pg — ABNORMAL HIGH (ref 26.0–34.0)
MCHC: 34.6 g/dL (ref 30.0–36.0)
MCV: 105.1 fL — ABNORMAL HIGH (ref 80.0–100.0)
Platelets: 216 10*3/uL (ref 150–400)
RBC: 3.55 MIL/uL — ABNORMAL LOW (ref 3.87–5.11)
RDW: 12.4 % (ref 11.5–15.5)
WBC: 8.3 10*3/uL (ref 4.0–10.5)
nRBC: 0 % (ref 0.0–0.2)

## 2021-02-28 LAB — HIV ANTIBODY (ROUTINE TESTING W REFLEX): HIV Screen 4th Generation wRfx: NONREACTIVE

## 2021-02-28 LAB — TRIGLYCERIDES: Triglycerides: 186 mg/dL — ABNORMAL HIGH (ref <150)

## 2021-02-28 LAB — LIPASE, BLOOD: Lipase: 147 U/L — ABNORMAL HIGH (ref 11–51)

## 2021-02-28 MED ORDER — AMLODIPINE BESYLATE 10 MG PO TABS
10.0000 mg | ORAL_TABLET | Freq: Every day | ORAL | Status: DC
  Administered 2021-02-28 – 2021-03-02 (×3): 10 mg via ORAL
  Filled 2021-02-28: qty 2
  Filled 2021-02-28 (×2): qty 1

## 2021-02-28 MED ORDER — MORPHINE SULFATE (PF) 4 MG/ML IV SOLN
4.0000 mg | INTRAVENOUS | Status: DC | PRN
  Administered 2021-02-28 – 2021-03-01 (×3): 4 mg via INTRAVENOUS
  Filled 2021-02-28 (×3): qty 1

## 2021-02-28 MED ORDER — OXYCODONE HCL 5 MG PO TABS
5.0000 mg | ORAL_TABLET | ORAL | Status: DC | PRN
  Administered 2021-02-28 – 2021-03-01 (×4): 5 mg via ORAL
  Filled 2021-02-28 (×4): qty 1

## 2021-02-28 MED ORDER — POTASSIUM CHLORIDE CRYS ER 20 MEQ PO TBCR
40.0000 meq | EXTENDED_RELEASE_TABLET | Freq: Once | ORAL | Status: AC
  Administered 2021-02-28: 40 meq via ORAL
  Filled 2021-02-28: qty 2

## 2021-02-28 MED ORDER — LABETALOL HCL 5 MG/ML IV SOLN
10.0000 mg | INTRAVENOUS | Status: AC | PRN
  Administered 2021-02-28 – 2021-03-01 (×3): 10 mg via INTRAVENOUS
  Filled 2021-02-28 (×3): qty 4

## 2021-02-28 MED ORDER — MORPHINE SULFATE (PF) 4 MG/ML IV SOLN
4.0000 mg | INTRAVENOUS | Status: DC | PRN
  Administered 2021-02-28 (×2): 4 mg via INTRAVENOUS
  Filled 2021-02-28 (×2): qty 1

## 2021-02-28 MED ORDER — HYDRALAZINE HCL 20 MG/ML IJ SOLN
10.0000 mg | Freq: Four times a day (QID) | INTRAMUSCULAR | Status: DC | PRN
  Administered 2021-02-28 – 2021-03-01 (×2): 10 mg via INTRAVENOUS
  Filled 2021-02-28 (×2): qty 1

## 2021-02-28 NOTE — ED Notes (Signed)
Pt ambulatory to restroom. NAD noted

## 2021-02-28 NOTE — ED Notes (Signed)
Pt requesting additional pain meds, see MAR

## 2021-02-28 NOTE — ED Notes (Signed)
Pt ambulatory to restroom, NAD noted.  

## 2021-02-28 NOTE — Progress Notes (Signed)
PROGRESS NOTE    Danielle Harrison  JSE:[id number] DOB: September 05, 1976 DOA: 02/27/2021 PCP: [facility]    Chief Complaint  Patient presents with  . Emesis  . Diarrhea    Brief Narrative:   Danielle Harrison is a 45 y.o. female with medical history significant for Asthma, GERD and chronic pain as well as recurrent abdominal pain, who presents to the ER, with intermittent abdominal pain for the past 5 days which has now become more persistent.  Her work-up was significant for elevated lipase, and CT abdomen pelvis significant for acute pancreatitis, patient was admitted for further management.   Assessment & Plan:   Principal Problem:   Acute pancreatitis Active Problems:   Chronic pain   Asthma      Acute pancreatitis -No evidence of gallstones on imaging, denies any heavy alcohol use(she only had 2 small glasses of wine last week may be precipitated this episode, but otherwise she is using alcohol seldom), triglyceride mildly elevated, not that high to explain her acute pancreatitis, I will obtain IgG4 as well. -Pain management, I will change Vicodin to oxycodone so I will can decrease her IV morphine to every 4 hours.  Hypertension -Not on any home medications, will start on Norvasc, she is already on as needed labetalol, will add as needed hydralazine as well    Chronic pain - Continue home Neurontin hydrocodone and Flexeril    Asthma - Not acutely exacerbated - Ventolin as needed     DVT prophylaxis: Lovenox Code Status: Full Family Communication: None at bedside Disposition:   Status is: Inpatient  Remains inpatient appropriate because:IV treatments appropriate due to intensity of illness or inability to take PO   Dispo: The patient is from: Home              Anticipated d/c is to: Home              Patient currently is not medically stable to d/c.   Difficult to place patient No       Consultants:   None  Subjective:  Planing of  abdominal pain, reports mild nausea, but she has been able to tolerate clear liquid diet, denies vomiting  Objective: Vitals:   02/28/21 1201 02/28/21 1230 02/28/21 1315 02/28/21 1500  BP: (!) 188/92 (!) 190/104 (!) 170/124 (!) 155/99  Pulse: 72 82 (!) 105 94  Resp: (!) 22 20 19 17   Temp:    98.6 F (37 C)  TempSrc:    Oral  SpO2: 100% 100% 100% 100%  Weight:      Height:        Intake/Output Summary (Last 24 hours) at 02/28/2021 1541 Last data filed at 02/27/2021 1758 Gross per 24 hour  Intake 1000 ml  Output --  Net 1000 ml   Filed Weights   02/27/21 1601  Weight: 55.3 kg    Examination:  General exam: Appears calm and comfortable  Respiratory system: Clear to auscultation. Respiratory effort normal. Cardiovascular system: S1 & S2 heard, RRR. No JVD, murmurs, rubs, gallops or clicks. No pedal edema. Gastrointestinal system: Abdomen is nondistended, soft, she has epigastric area tenderness to palpation. No organomegaly or masses felt. Normal bowel sounds heard. Central nervous system: Alert and oriented. No focal neurological deficits. Extremities: Symmetric 5 x 5 power. Skin: No rashes, lesions or ulcers Psychiatry: Judgement and insight appear normal. Mood & affect appropriate.   Patient was seen and examined with female chaperone her RN 03/01/21 at bedside  Data Reviewed: I have personally reviewed following labs and imaging studies  CBC: Recent Labs  Lab 02/27/21 1648 02/28/21 0436  WBC 11.0* 8.3  NEUTROABS 9.3*  --   HGB 13.6 12.9  HCT 39.3 37.3  MCV 105.6* 105.1*  PLT 243 216    Basic Metabolic Panel: Recent Labs  Lab 02/27/21 1648 02/27/21 1813 02/28/21 0436  NA 131*  --  139  K 3.0*  --  3.4*  CL 100  --  109  CO2 21*  --  21*  GLUCOSE 126*  --  102*  BUN 11  --  <5*  CREATININE 0.58  --  0.46  CALCIUM 9.1  --  8.9  MG  --  1.8  --     GFR: Estimated Creatinine Clearance: 70 mL/min (by C-G formula based on SCr of 0.46 mg/dL).  Liver  Function Tests: Recent Labs  Lab 02/27/21 1648 02/28/21 0436  AST 18 13*  ALT 12 9  ALKPHOS 81 69  BILITOT 0.8 0.7  PROT 7.4 6.9  ALBUMIN 4.1 3.7    CBG: No results for input(s): GLUCAP in the last 168 hours.   Recent Results (from the past 240 hour(s))  Resp Panel by RT-PCR (Flu A&B, Covid) Nasopharyngeal Swab     Status: None   Collection Time: 02/27/21  4:48 PM   Specimen: Nasopharyngeal Swab; Nasopharyngeal(NP) swabs in vial transport medium  Result Value Ref Range Status   SARS Coronavirus 2 by RT PCR NEGATIVE NEGATIVE Final    Comment: (NOTE) SARS-CoV-2 target nucleic acids are NOT DETECTED.  The SARS-CoV-2 RNA is generally detectable in upper respiratory specimens during the acute phase of infection. The lowest concentration of SARS-CoV-2 viral copies this assay can detect is 138 copies/mL. A negative result does not preclude SARS-Cov-2 infection and should not be used as the sole basis for treatment or other patient management decisions. A negative result may occur with  improper specimen collection/handling, submission of specimen other than nasopharyngeal swab, presence of viral mutation(s) within the areas targeted by this assay, and inadequate number of viral copies(<138 copies/mL). A negative result must be combined with clinical observations, patient history, and epidemiological information. The expected result is Negative.  Fact Sheet for Patients:  BloggerCourse.com  Fact Sheet for Healthcare Providers:  SeriousBroker.it  This test is no t yet approved or cleared by the [place] FDA and  has been authorized for detection and/or diagnosis of SARS-CoV-2 by FDA under an Emergency Use Authorization (EUA). This EUA will remain  in effect (meaning this test can be used) for the duration of the COVID-19 declaration under Section 564(b)(1) of the Act, 21 U.S.C.section 360bbb-3(b)(1), unless the  authorization is terminated  or revoked sooner.       Influenza A by PCR NEGATIVE NEGATIVE Final   Influenza B by PCR NEGATIVE NEGATIVE Final    Comment: (NOTE) The Xpert Xpress SARS-CoV-2/FLU/RSV plus assay is intended as an aid in the diagnosis of influenza from Nasopharyngeal swab specimens and should not be used as a sole basis for treatment. Nasal washings and aspirates are unacceptable for Xpert Xpress SARS-CoV-2/FLU/RSV testing.  Fact Sheet for Patients: BloggerCourse.com  Fact Sheet for Healthcare Providers: SeriousBroker.it  This test is not yet approved or cleared by the [place] FDA and has been authorized for detection and/or diagnosis of SARS-CoV-2 by FDA under an Emergency Use Authorization (EUA). This EUA will remain in effect (meaning this test can be used) for the duration of the COVID-19 declaration  under Section 564(b)(1) of the Act, 21 U.S.C. section 360bbb-3(b)(1), unless the authorization is terminated or revoked.  Performed at [facility], [address] [place], [place] [address]          Radiology Studies: CT ABDOMEN PELVIS WO CONTRAST  Result Date: 02/27/2021 CLINICAL DATA:  Abdominal distension, nausea, vomiting EXAM: CT ABDOMEN AND PELVIS WITHOUT CONTRAST TECHNIQUE: Multidetector CT imaging of the abdomen and pelvis was performed following the standard protocol without IV contrast. COMPARISON:  CT 06/26/2019 FINDINGS: Lower chest: Lung bases are clear. Normal heart size. No pericardial effusion. Hepatobiliary: No worrisome focal liver abnormality is seen. Normal gallbladder. No visible calcified gallstones. No biliary ductal dilatation. Pancreas: Hazy baby pancreatic stranding is noted diffusely with a slightly edematous appearance of the pancreatic parenchyma. No organized collection is seen. No discernible pancreatic mass or ductal dilatation. Spleen: Normal in size. No concerning  splenic lesions. Adrenals/Urinary Tract: Normal adrenal glands. No visible or contour deforming renal lesions. No urolithiasis or hydronephrosis. Urinary bladder is unremarkable for the degree of distention. Stomach/Bowel: Distal esophagus and stomach are unremarkable for degree of distension. Some stranding about the duodenum without significant mural thickening, favored to be secondary to the pancreatic process. No other small bowel thickening dilatation or evidence of obstruction. Normal-appearing noninflamed appendix in the right lower quadrant. Some stranding about the descending colon may be redistributed though slight mural thickening is noted with intramural fatty infiltration. Could be accentuated by underdistention with colitis less favored though not completely excluded. Vascular/Lymphatic: No significant vascular findings are present. No enlarged abdominal or pelvic lymph nodes. Reproductive: Anteverted uterus. Few low-attenuation foci towards the cervix, likely nabothian cysts. Radiolucent tampon in the vaginal canal. No concerning adnexal lesions. Other: Small volume low-attenuation fluid in the deep pelvis, may be redistributed/reactive. Stranding about pancreas as well. No free air. No organized abscess or collection. Postsurgical changes from remote appearing low vertical midline incision. Musculoskeletal: Multilevel degenerative changes are present in the imaged portions of the spine. Grade 2 anterolisthesis L4 on L5 without spondylolysis. Mild degenerative changes in the hips and pelvis. No acute osseous abnormality or suspicious osseous lesion. IMPRESSION: Hazy stranding centered predominantly about the pancreas with some likely redistribution in the retroperitoneum and left pericolic gutter. Appearance could suggest acute interstitial edematous pancreatitis. Recommend correlation with lipase. Some intramural fatty infiltration noted within the colon, particularly the descending segment.  Thickened appearance of the proximal descending with adjacent stranding favored to be incidental decompression and redistribute fluid with active colitis less favored. Aortic Atherosclerosis (ICD10-I70.0). Electronically Signed   By: Kreg Shropshire M.D.   On: 02/27/2021 18:50        Scheduled Meds: . amLODipine  10 mg Oral Daily  . enoxaparin (LOVENOX) injection  40 mg Subcutaneous Q24H   Continuous Infusions: . sodium chloride 150 mL/hr at 02/28/21 1523     LOS: 0 days     Huey Bienenstock, MD [facility]   To contact the attending provider between 7A-7P or the covering provider during after hours 7P-7A, please log into the web site [email or web address] and access using universal [place] password for that web site. If you do not have the password, please call the hospital operator.  02/28/2021, 3:41 PM

## 2021-02-28 NOTE — ED Notes (Signed)
Pt hooked back up to monitors. NAD noted, sitting on side of bed watching tv

## 2021-02-28 NOTE — ED Notes (Signed)
Informed RN bed assigned 

## 2021-02-28 NOTE — Progress Notes (Signed)
This 45 yo pt was admitted to 230 from the ED with abdominal pain. Dx: pancreatitis.  Hx: GERD, asthma, chronic pain, recurrent abdominal pain. Initial assessment completed and admission profile completed.

## 2021-02-28 NOTE — ED Notes (Signed)
Pt ambulatory to restroom

## 2021-02-28 NOTE — ED Notes (Signed)
Dr Randol Kern messaged via secure chat to notify SBP greater than 180, not due for additional labetalol.

## 2021-03-01 DIAGNOSIS — J45909 Unspecified asthma, uncomplicated: Secondary | ICD-10-CM

## 2021-03-01 LAB — CBC
HCT: 39.2 % (ref 36.0–46.0)
Hemoglobin: 13.9 g/dL (ref 12.0–15.0)
MCH: 36.9 pg — ABNORMAL HIGH (ref 26.0–34.0)
MCHC: 35.5 g/dL (ref 30.0–36.0)
MCV: 104 fL — ABNORMAL HIGH (ref 80.0–100.0)
Platelets: 278 10*3/uL (ref 150–400)
RBC: 3.77 MIL/uL — ABNORMAL LOW (ref 3.87–5.11)
RDW: 12.3 % (ref 11.5–15.5)
WBC: 8.3 10*3/uL (ref 4.0–10.5)
nRBC: 0 % (ref 0.0–0.2)

## 2021-03-01 LAB — BASIC METABOLIC PANEL
Anion gap: 9 (ref 5–15)
BUN: <5 mg/dL — ABNORMAL LOW (ref 6–20)
CO2: 25 mmol/L (ref 22–32)
Calcium: 9.4 mg/dL (ref 8.9–10.3)
Chloride: 105 mmol/L (ref 98–111)
Creatinine, Ser: 0.45 mg/dL (ref 0.44–1.00)
GFR, Estimated: >60 mL/min (ref >60)
Glucose, Bld: 119 mg/dL — ABNORMAL HIGH (ref 70–99)
Potassium: 3.8 mmol/L (ref 3.5–5.1)
Sodium: 139 mmol/L (ref 135–145)

## 2021-03-01 LAB — LIPASE, BLOOD: Lipase: 119 U/L — ABNORMAL HIGH (ref 11–51)

## 2021-03-01 MED ORDER — METOPROLOL TARTRATE 25 MG PO TABS
25.0000 mg | ORAL_TABLET | Freq: Two times a day (BID) | ORAL | Status: DC
  Administered 2021-03-01 – 2021-03-02 (×3): 25 mg via ORAL
  Filled 2021-03-01 (×3): qty 1

## 2021-03-01 MED ORDER — POLYETHYLENE GLYCOL 3350 17 G PO PACK
17.0000 g | PACK | Freq: Two times a day (BID) | ORAL | Status: DC
  Administered 2021-03-01 – 2021-03-02 (×2): 17 g via ORAL
  Filled 2021-03-01 (×2): qty 1

## 2021-03-01 MED ORDER — HYDRALAZINE HCL 25 MG PO TABS
25.0000 mg | ORAL_TABLET | Freq: Three times a day (TID) | ORAL | Status: DC
  Administered 2021-03-01 (×2): 25 mg via ORAL
  Filled 2021-03-01 (×2): qty 1

## 2021-03-01 MED ORDER — MORPHINE SULFATE (PF) 2 MG/ML IV SOLN
2.0000 mg | INTRAVENOUS | Status: DC | PRN
Start: 2021-03-01 — End: 2021-03-02
  Administered 2021-03-01 – 2021-03-02 (×4): 2 mg via INTRAVENOUS
  Filled 2021-03-01 (×4): qty 1

## 2021-03-01 MED ORDER — OXYCODONE HCL 5 MG PO TABS
10.0000 mg | ORAL_TABLET | ORAL | Status: DC | PRN
  Administered 2021-03-01 – 2021-03-02 (×5): 10 mg via ORAL
  Filled 2021-03-01 (×5): qty 2

## 2021-03-01 MED ORDER — HYDRALAZINE HCL 20 MG/ML IJ SOLN
10.0000 mg | INTRAMUSCULAR | Status: DC | PRN
  Administered 2021-03-02: 10 mg via INTRAVENOUS
  Filled 2021-03-01: qty 1

## 2021-03-01 NOTE — Progress Notes (Signed)
PROGRESS NOTE    Danielle Harrison  [id number]:[id number] DOB: [birth year] DOA: 02/27/2021 PCP: [facility]    Chief Complaint  Patient presents with  . Emesis  . Diarrhea    Brief Narrative:   Danielle BonnetChristina L Harrison is a 45 y.o. female with medical history significant for Asthma, GERD and chronic pain as well as recurrent abdominal pain, who presents to the ER, with intermittent abdominal pain for the past 5 days which has now become more persistent.  Her work-up was significant for elevated lipase, and CT abdomen pelvis significant for acute pancreatitis, patient was admitted for further management.   Subjective:  She still complaining of abdominal pain, reports she has been tolerating clear liquid diet, she does report constipation today.   Assessment & Plan:   Principal Problem:   Acute pancreatitis Active Problems:   Chronic pain   Asthma    Acute pancreatitis -No evidence of gallstones on imaging, denies any heavy alcohol use(she only had 2 small glasses of wine last week may be precipitated this episode, but otherwise she is using alcohol seldom), triglyceride mildly elevated, not that high to explain her acute pancreatitis. -Follow on IgG4 -Triglycerides of 186 -We Danielle discontinue her statin in the setting of acute pancreatitis -She still complains of abdominal pain, reports IV morphine helped does not last for long enough, so I Danielle go ahead decrease her IV morphine, and increase her oxycodone to 10 mg every 4 hours.    Hypertension -Not on any home medications, blood pressure remains significantly elevated, so added metoprolol and low-dose hydralazine today as well, continue with as needed IV hydralazine.      Chronic pain - Continue home Neurontin hydrocodone and Flexeril    Asthma - Not acutely exacerbated - Ventolin as needed   Hyperlipidemia -Discontinue statin currently as patient with acute pancreatitis   DVT prophylaxis: Lovenox Code Status:  Full Family Communication: None at bedside Disposition:   Status is: Inpatient  Remains inpatient appropriate because:IV treatments appropriate due to intensity of illness or inability to take PO   Dispo: The patient is from: Home              Anticipated d/c is to: Home              Patient currently is not medically stable to d/c.   Difficult to place patient No       Consultants:   None    Objective: Vitals:   02/28/21 2041 03/01/21 0524 03/01/21 0800 03/01/21 0958  BP: (!) 168/110 (!) 155/106 (!) 163/109 (!) 170/111  Pulse: 97 97 (!) 108 (!) 103  Resp: 16 18 18 17   Temp: 98.6 F (37 C) 98.3 F (36.8 C) 98.3 F (36.8 C) 98 F (36.7 C)  TempSrc: Oral  Oral Oral  SpO2: 100% 100% 100% 100%  Weight:      Height:        Intake/Output Summary (Last 24 hours) at 03/01/2021 1515 Last data filed at 03/01/2021 1343 Gross per 24 hour  Intake 1260 ml  Output --  Net 1260 ml   Filed Weights   02/27/21 1601  Weight: 55.3 kg    Examination:  Awake Alert, Oriented X 3, No new F.N deficits, Normal affect Symmetrical Chest wall movement, Good air movement bilaterally, CTAB RRR,No Gallops,Rubs or new Murmurs, No Parasternal Heave +ve B.Sounds, Abd Soft, she remains with some tenderness, No rebound - guarding or rigidity. No Cyanosis, Clubbing or edema, No new Rash or  bruise      Data Reviewed: I have personally reviewed following labs and imaging studies  CBC: Recent Labs  Lab 02/27/21 1648 02/28/21 0436 03/01/21 0357  WBC 11.0* 8.3 8.3  NEUTROABS 9.3*  --   --   HGB 13.6 12.9 13.9  HCT 39.3 37.3 39.2  MCV 105.6* 105.1* 104.0*  PLT 243 216 278    Basic Metabolic Panel: Recent Labs  Lab 02/27/21 1648 02/27/21 1813 02/28/21 0436 03/01/21 0357  NA 131*  --  139 139  K 3.0*  --  3.4* 3.8  CL 100  --  109 105  CO2 21*  --  21* 25  GLUCOSE 126*  --  102* 119*  BUN 11  --  <5* <5*  CREATININE 0.58  --  0.46 0.45  CALCIUM 9.1  --  8.9 9.4  MG  --   1.8  --   --     GFR: Estimated Creatinine Clearance: 70 mL/min (by C-G formula based on SCr of 0.45 mg/dL).  Liver Function Tests: Recent Labs  Lab 02/27/21 1648 02/28/21 0436  AST 18 13*  ALT 12 9  ALKPHOS 81 69  BILITOT 0.8 0.7  PROT 7.4 6.9  ALBUMIN 4.1 3.7    CBG: No results for input(s): GLUCAP in the last 168 hours.   Recent Results (from the past 240 hour(s))  Resp Panel by RT-PCR (Flu A&B, Covid) Nasopharyngeal Swab     Status: None   Collection Time: 02/27/21  4:48 PM   Specimen: Nasopharyngeal Swab; Nasopharyngeal(NP) swabs in vial transport medium  Result Value Ref Range Status   SARS Coronavirus 2 by RT PCR NEGATIVE NEGATIVE Final    Comment: (NOTE) SARS-CoV-2 target nucleic acids are NOT DETECTED.  The SARS-CoV-2 RNA is generally detectable in upper respiratory specimens during the acute phase of infection. The lowest concentration of SARS-CoV-2 viral copies this assay can detect is 138 copies/mL. A negative result does not preclude SARS-Cov-2 infection and should not be used as the sole basis for treatment or other patient management decisions. A negative result may occur with  improper specimen collection/handling, submission of specimen other than nasopharyngeal swab, presence of viral mutation(s) within the areas targeted by this assay, and inadequate number of viral copies(<138 copies/mL). A negative result must be combined with clinical observations, patient history, and epidemiological information. The expected result is Negative.  Fact Sheet for Patients:  BloggerCourse.com  Fact Sheet for Healthcare Providers:  SeriousBroker.it  This test is no t yet approved or cleared by the [place] FDA and  has been authorized for detection and/or diagnosis of SARS-CoV-2 by FDA under an Emergency Use Authorization (EUA). This EUA Danielle remain  in effect (meaning this test can be used) for the duration  of the COVID-19 declaration under Section 564(b)(1) of the Act, 21 U.S.C.section 360bbb-3(b)(1), unless the authorization is terminated  or revoked sooner.       Influenza A by PCR NEGATIVE NEGATIVE Final   Influenza B by PCR NEGATIVE NEGATIVE Final    Comment: (NOTE) The Xpert Xpress SARS-CoV-2/FLU/RSV plus assay is intended as an aid in the diagnosis of influenza from Nasopharyngeal swab specimens and should not be used as a sole basis for treatment. Nasal washings and aspirates are unacceptable for Xpert Xpress SARS-CoV-2/FLU/RSV testing.  Fact Sheet for Patients: BloggerCourse.com  Fact Sheet for Healthcare Providers: SeriousBroker.it  This test is not yet approved or cleared by the [place] FDA and has been authorized for detection and/or diagnosis  of SARS-CoV-2 by FDA under an Emergency Use Authorization (EUA). This EUA Danielle remain in effect (meaning this test can be used) for the duration of the COVID-19 declaration under Section 564(b)(1) of the Act, 21 U.S.C. section 360bbb-3(b)(1), unless the authorization is terminated or revoked.  Performed at [facility], [address] [place], [place] [address]          Radiology Studies: CT ABDOMEN PELVIS WO CONTRAST  Result Date: 02/27/2021 CLINICAL DATA:  Abdominal distension, nausea, vomiting EXAM: CT ABDOMEN AND PELVIS WITHOUT CONTRAST TECHNIQUE: Multidetector CT imaging of the abdomen and pelvis was performed following the standard protocol without IV contrast. COMPARISON:  CT 06/26/2019 FINDINGS: Lower chest: Lung bases are clear. Normal heart size. No pericardial effusion. Hepatobiliary: No worrisome focal liver abnormality is seen. Normal gallbladder. No visible calcified gallstones. No biliary ductal dilatation. Pancreas: Hazy baby pancreatic stranding is noted diffusely with a slightly edematous appearance of the pancreatic parenchyma. No organized  collection is seen. No discernible pancreatic mass or ductal dilatation. Spleen: Normal in size. No concerning splenic lesions. Adrenals/Urinary Tract: Normal adrenal glands. No visible or contour deforming renal lesions. No urolithiasis or hydronephrosis. Urinary bladder is unremarkable for the degree of distention. Stomach/Bowel: Distal esophagus and stomach are unremarkable for degree of distension. Some stranding about the duodenum without significant mural thickening, favored to be secondary to the pancreatic process. No other small bowel thickening dilatation or evidence of obstruction. Normal-appearing noninflamed appendix in the right lower quadrant. Some stranding about the descending colon may be redistributed though slight mural thickening is noted with intramural fatty infiltration. Could be accentuated by underdistention with colitis less favored though not completely excluded. Vascular/Lymphatic: No significant vascular findings are present. No enlarged abdominal or pelvic lymph nodes. Reproductive: Anteverted uterus. Few low-attenuation foci towards the cervix, likely nabothian cysts. Radiolucent tampon in the vaginal canal. No concerning adnexal lesions. Other: Small volume low-attenuation fluid in the deep pelvis, may be redistributed/reactive. Stranding about pancreas as well. No free air. No organized abscess or collection. Postsurgical changes from remote appearing low vertical midline incision. Musculoskeletal: Multilevel degenerative changes are present in the imaged portions of the spine. Grade 2 anterolisthesis L4 on L5 without spondylolysis. Mild degenerative changes in the hips and pelvis. No acute osseous abnormality or suspicious osseous lesion. IMPRESSION: Hazy stranding centered predominantly about the pancreas with some likely redistribution in the retroperitoneum and left pericolic gutter. Appearance could suggest acute interstitial edematous pancreatitis. Recommend correlation with  lipase. Some intramural fatty infiltration noted within the colon, particularly the descending segment. Thickened appearance of the proximal descending with adjacent stranding favored to be incidental decompression and redistribute fluid with active colitis less favored. Aortic Atherosclerosis (ICD10-I70.0). Electronically Signed   By: Kreg Shropshire M.D.   On: 02/27/2021 18:50        Scheduled Meds: . amLODipine  10 mg Oral Daily  . enoxaparin (LOVENOX) injection  40 mg Subcutaneous Q24H  . hydrALAZINE  25 mg Oral Q8H  . metoprolol tartrate  25 mg Oral BID  . polyethylene glycol  17 g Oral BID   Continuous Infusions: . sodium chloride 100 mL/hr at 03/01/21 0831     LOS: 1 day     Huey Bienenstock, MD [facility]   To contact the attending provider between 7A-7P or the covering provider during after hours 7P-7A, please log into the web site [email or web address] and access using universal [place] password for that web site. If you do not have the password, please call  the hospital operator.  03/01/2021, 3:15 PM

## 2021-03-01 NOTE — Progress Notes (Signed)
Mobility Specialist - Progress Note   03/01/21 1200  Mobility  Activity Contraindicated/medical hold  Mobility performed by Mobility specialist    Per discussion with nurse, pt not appropriate for exertional activity at this time. Pt noted to have elevated BP this date (170/111). Will continue to monitor and attempt session another date/time as medically appropriate.    Filiberto Pinks Mobility Specialist 03/01/21, 12:14 PM

## 2021-03-02 LAB — COMPREHENSIVE METABOLIC PANEL
ALT: 10 U/L (ref 0–44)
AST: 13 U/L — ABNORMAL LOW (ref 15–41)
Albumin: 3.8 g/dL (ref 3.5–5.0)
Alkaline Phosphatase: 65 U/L (ref 38–126)
Anion gap: 12 (ref 5–15)
BUN: <5 mg/dL — ABNORMAL LOW (ref 6–20)
CO2: 24 mmol/L (ref 22–32)
Calcium: 9.7 mg/dL (ref 8.9–10.3)
Chloride: 100 mmol/L (ref 98–111)
Creatinine, Ser: 0.43 mg/dL — ABNORMAL LOW (ref 0.44–1.00)
GFR, Estimated: >60 mL/min (ref >60)
Glucose, Bld: 128 mg/dL — ABNORMAL HIGH (ref 70–99)
Potassium: 3.2 mmol/L — ABNORMAL LOW (ref 3.5–5.1)
Sodium: 136 mmol/L (ref 135–145)
Total Bilirubin: 0.8 mg/dL (ref 0.3–1.2)
Total Protein: 7.2 g/dL (ref 6.5–8.1)

## 2021-03-02 LAB — CBC
HCT: 39.1 % (ref 36.0–46.0)
Hemoglobin: 14.1 g/dL (ref 12.0–15.0)
MCH: 36.6 pg — ABNORMAL HIGH (ref 26.0–34.0)
MCHC: 36.1 g/dL — ABNORMAL HIGH (ref 30.0–36.0)
MCV: 101.6 fL — ABNORMAL HIGH (ref 80.0–100.0)
Platelets: 304 10*3/uL (ref 150–400)
RBC: 3.85 MIL/uL — ABNORMAL LOW (ref 3.87–5.11)
RDW: 12.1 % (ref 11.5–15.5)
WBC: 7.4 10*3/uL (ref 4.0–10.5)
nRBC: 0 % (ref 0.0–0.2)

## 2021-03-02 LAB — IGG 4: IgG, Subclass 4: 10 mg/dL (ref 2–96)

## 2021-03-02 LAB — LIPASE, BLOOD: Lipase: 102 U/L — ABNORMAL HIGH (ref 11–51)

## 2021-03-02 MED ORDER — OXYCODONE HCL 5 MG PO TABS: 5.0000 mg | ORAL_TABLET | Freq: Four times a day (QID) | ORAL | 0 refills | Status: AC | PRN

## 2021-03-02 MED ORDER — ONDANSETRON 4 MG PO TBDP: 4.0000 mg | ORAL_TABLET | Freq: Three times a day (TID) | ORAL | 0 refills | Status: DC | PRN

## 2021-03-02 MED ORDER — AMLODIPINE BESYLATE 10 MG PO TABS: 10.0000 mg | ORAL_TABLET | Freq: Every day | ORAL | 0 refills | Status: DC

## 2021-03-02 MED ORDER — HYDRALAZINE HCL 50 MG PO TABS: 50.0000 mg | ORAL_TABLET | Freq: Three times a day (TID) | ORAL | Status: DC

## 2021-03-02 NOTE — Plan of Care (Signed)
  Problem: Clinical Measurements: Goal: Ability to maintain clinical measurements within normal limits will improve Outcome: Progressing Goal: Will remain free from infection Outcome: Progressing Goal: Diagnostic test results will improve Outcome: Progressing Goal: Respiratory complications will improve Outcome: Progressing Goal: Cardiovascular complication will be avoided Outcome: Progressing   Problem: Pain Managment: Goal: General experience of comfort will improve Outcome: Progressing   Pt is involved in and agrees with the plan of care. Reports pain on her upper abdomen radiates to back. Morphine IV and oxycodone given alternately. BP elevated; Hydralazine IV prn given.Marland Kitchen

## 2021-03-02 NOTE — Progress Notes (Signed)
PROGRESS NOTE    Danielle Harrison  TDH:[id number] DOB: 1976/02/17 DOA: 02/27/2021 PCP: [facility]    Chief Complaint  Patient presents with  . Emesis  . Diarrhea    Brief Narrative:   Danielle Harrison is a 45 y.o. female with medical history significant for Asthma, GERD and chronic pain as well as recurrent abdominal pain, who presents to the ER, with intermittent abdominal pain for the past 5 days which has now become more persistent.  Her work-up was significant for elevated lipase, and CT abdomen pelvis significant for acute pancreatitis, patient was admitted for further management.   Subjective:  Abdominal pain improving.  Tolerating liquid diet.  Assessment & Plan:   Principal Problem:   Acute pancreatitis Active Problems:   Chronic pain   Asthma    Acute pancreatitis -No evidence of gallstones on imaging, denies any heavy alcohol use(she only had 2 small glasses of wine last week may be precipitated this episode, but otherwise she is using alcohol seldom), triglyceride mildly elevated, not that high to explain her acute pancreatitis. Plan: Continue IV fluids for now Cautiously advance to soft diet Trend lipase, downtrending over interval Continue holding statin Avoid IV narcotics Possible discharge within 24 hours  Hypertension -Not on any home medications, - blood pressure remains significantly elevated,  Plan: Continue metoprolol 25 twice daily Increase hydralazine to 50 every 8 Continue Norvasc 10 mg daily As needed IV hydralazine     Chronic pain - Continue home Neurontin hydrocodone and Flexeril    Asthma - Not acutely exacerbated - Ventolin as needed   Hyperlipidemia -Discontinue statin currently as patient with acute pancreatitis   DVT prophylaxis: Lovenox Code Status: Full Family Communication: None at bedside Disposition:   Status is: Inpatient  Remains inpatient appropriate because:IV treatments appropriate due to  intensity of illness or inability to take PO   Dispo: The patient is from: Home              Anticipated d/c is to: Home              Patient currently is not medically stable to d/c.   Difficult to place patient No  Acute pancreatitis.  Slowly resolving.  Possible discharge today or tomorrow.     Consultants:   None    Objective: Vitals:   03/01/21 2344 03/02/21 0401 03/02/21 0500 03/02/21 0830  BP: (!) 154/110 (!) 161/107 (!) 140/106 (!) 157/95  Pulse: 87 (!) 107  100  Resp: 18 18  16   Temp: 99 F (37.2 C) 98 F (36.7 C)  97.9 F (36.6 C)  TempSrc: Oral Oral  Oral  SpO2: 99% 100%  100%  Weight:      Height:        Intake/Output Summary (Last 24 hours) at 03/02/2021 1040 Last data filed at 03/02/2021 0531 Gross per 24 hour  Intake 480 ml  Output --  Net 480 ml   Filed Weights   02/27/21 1601  Weight: 55.3 kg    Examination:  Awake Alert, Oriented X 3, No new F.N deficits, Normal affect Symmetrical Chest wall movement, Good air movement bilaterally, CTAB RRR,No Gallops,Rubs or new Murmurs, No Parasternal Heave +ve B.Sounds, Abd soft.  Mild tender to palpation.  No rebound guarding or rigidity.  Positive bowel sounds no Cyanosis, Clubbing or edema, No new Rash or bruise      Data Reviewed: I have personally reviewed following labs and imaging studies  CBC: Recent Labs  Lab  02/27/21 1648 02/28/21 0436 03/01/21 0357 03/02/21 0433  WBC 11.0* 8.3 8.3 7.4  NEUTROABS 9.3*  --   --   --   HGB 13.6 12.9 13.9 14.1  HCT 39.3 37.3 39.2 39.1  MCV 105.6* 105.1* 104.0* 101.6*  PLT 243 216 278 304    Basic Metabolic Panel: Recent Labs  Lab 02/27/21 1648 02/27/21 1813 02/28/21 0436 03/01/21 0357 03/02/21 0433  NA 131*  --  139 139 136  K 3.0*  --  3.4* 3.8 3.2*  CL 100  --  109 105 100  CO2 21*  --  21* 25 24  GLUCOSE 126*  --  102* 119* 128*  BUN 11  --  <5* <5* <5*  CREATININE 0.58  --  0.46 0.45 0.43*  CALCIUM 9.1  --  8.9 9.4 9.7  MG  --  1.8   --   --   --     GFR: Estimated Creatinine Clearance: 70 mL/min (A) (by C-G formula based on SCr of 0.43 mg/dL (L)).  Liver Function Tests: Recent Labs  Lab 02/27/21 1648 02/28/21 0436 03/02/21 0433  AST 18 13* 13*  ALT 12 9 10   ALKPHOS 81 69 65  BILITOT 0.8 0.7 0.8  PROT 7.4 6.9 7.2  ALBUMIN 4.1 3.7 3.8    CBG: No results for input(s): GLUCAP in the last 168 hours.   Recent Results (from the past 240 hour(s))  Resp Panel by RT-PCR (Flu A&B, Covid) Nasopharyngeal Swab     Status: None   Collection Time: 02/27/21  4:48 PM   Specimen: Nasopharyngeal Swab; Nasopharyngeal(NP) swabs in vial transport medium  Result Value Ref Range Status   SARS Coronavirus 2 by RT PCR NEGATIVE NEGATIVE Final    Comment: (NOTE) SARS-CoV-2 target nucleic acids are NOT DETECTED.  The SARS-CoV-2 RNA is generally detectable in upper respiratory specimens during the acute phase of infection. The lowest concentration of SARS-CoV-2 viral copies this assay can detect is 138 copies/mL. A negative result does not preclude SARS-Cov-2 infection and should not be used as the sole basis for treatment or other patient management decisions. A negative result may occur with  improper specimen collection/handling, submission of specimen other than nasopharyngeal swab, presence of viral mutation(s) within the areas targeted by this assay, and inadequate number of viral copies(<138 copies/mL). A negative result must be combined with clinical observations, patient history, and epidemiological information. The expected result is Negative.  Fact Sheet for Patients:  03/01/21  Fact Sheet for Healthcare Providers:  BloggerCourse.com  This test is no t yet approved or cleared by the [place] FDA and  has been authorized for detection and/or diagnosis of SARS-CoV-2 by FDA under an Emergency Use Authorization (EUA). This EUA will remain  in effect  (meaning this test can be used) for the duration of the COVID-19 declaration under Section 564(b)(1) of the Act, 21 U.S.C.section 360bbb-3(b)(1), unless the authorization is terminated  or revoked sooner.       Influenza A by PCR NEGATIVE NEGATIVE Final   Influenza B by PCR NEGATIVE NEGATIVE Final    Comment: (NOTE) The Xpert Xpress SARS-CoV-2/FLU/RSV plus assay is intended as an aid in the diagnosis of influenza from Nasopharyngeal swab specimens and should not be used as a sole basis for treatment. Nasal washings and aspirates are unacceptable for Xpert Xpress SARS-CoV-2/FLU/RSV testing.  Fact Sheet for Patients: [place]  Fact Sheet for Healthcare Providers: BloggerCourse.com  This test is not yet approved or cleared by the SeriousBroker.it  States FDA and has been authorized for detection and/or diagnosis of SARS-CoV-2 by FDA under an Emergency Use Authorization (EUA). This EUA will remain in effect (meaning this test can be used) for the duration of the COVID-19 declaration under Section 564(b)(1) of the Act, 21 U.S.C. section 360bbb-3(b)(1), unless the authorization is terminated or revoked.  Performed at [facility], [address], [place], [place] [address]          Radiology Studies: No results found.      Scheduled Meds: . amLODipine  10 mg Oral Daily  . enoxaparin (LOVENOX) injection  40 mg Subcutaneous Q24H  . hydrALAZINE  50 mg Oral Q8H  . metoprolol tartrate  25 mg Oral BID  . polyethylene glycol  17 g Oral BID   Continuous Infusions: . sodium chloride 75 mL/hr at 03/02/21 0802     LOS: 2 days     Tresa Moore, MD [facility]   To contact the attending provider between 7A-7P or the covering provider during after hours 7P-7A, please log into the web site [email or web address] and access using universal [place] password for that web site. If you do not have the password,  please call the hospital operator.  03/02/2021, 10:40 AM

## 2021-03-02 NOTE — Progress Notes (Signed)
Pt's PIV removed with tip intact. Discharge instructions explained with pt. Pt denies any questions or concerns. Pt to pick up meds from home pharmacy.   

## 2021-03-02 NOTE — Discharge Instructions (Signed)
Acute Pancreatitis    The pancreas is a gland that is located behind the stomach on the left side of the abdomen. It produces enzymes that help to digest food. The pancreas also releases the hormones glucagon and insulin, which help to regulate blood sugar. Acute pancreatitis happens when inflammation of the pancreas suddenly occurs and the pancreas becomes irritated and swollen.  Most acute attacks last a few days and cause serious problems. Some people become dehydrated and develop low blood pressure. In severe cases, bleeding in the abdomen can lead to shock and can be life-threatening. The lungs, heart, and kidneys may fail.  What are the causes?  This condition may be caused by:  · Alcohol abuse.  · Drug abuse.  · Gallstones or other conditions that can block the tube that drains the pancreas (pancreatic duct).  · A tumor in the pancreas.  Other causes include:  · Certain medicines.  · Exposure to certain chemicals.  · Diabetes.  · An infection in the pancreas.  · Damage caused by an accident (trauma).  · The poison (venom) from a scorpion bite.  · Abdominal surgery.  · Autoimmune pancreatitis. This is when the body's disease-fighting (immune) system attacks the pancreas.  · Genes that are passed from parent to child (inherited).  In some cases, the cause of this condition is not known.  What are the signs or symptoms?  Symptoms of this condition include:  · Pain in the upper abdomen that may radiate to the back. Pain may be severe.  · Tenderness and swelling of the abdomen.  · Nausea and vomiting.  · Fever.  How is this diagnosed?  This condition may be diagnosed based on:  · A physical exam.  · Blood tests.  · Imaging tests, such as X-rays, CT or MRI scans, or an ultrasound of the abdomen.  How is this treated?  Treatment for this condition usually requires a stay in the hospital. Treatment for this condition may include:  · Pain medicine.  · Fluid replacement through an IV.  · Placing a tube in the stomach  to remove stomach contents and to control vomiting (NG tube, or nasogastric tube).  · Not eating for 3-4 days. This gives the pancreas a rest, because enzymes are not being produced that can cause further damage.  · Antibiotic medicines, if your condition is caused by an infection.  · Treating any underlying conditions that may be the cause.  · Steroid medicines, if your condition is caused by your immune system attacking your body's own tissues (autoimmune disease).  · Surgery on the pancreas or gallbladder.  Follow these instructions at home:  Eating and drinking    · Follow instructions from your health care provider about diet. This may involve avoiding alcohol and decreasing the amount of fat in your diet.  · Eat smaller, more frequent meals. This reduces the amount of digestive fluids that the pancreas produces.  · Drink enough fluid to keep your urine pale yellow.  · Do not drink alcohol if it caused your condition.  General instructions  · Take over-the-counter and prescription medicines only as told by your health care provider.  · Do not drive or use heavy machinery while taking prescription pain medicine.  · Ask your health care provider if the medicine prescribed to you can cause constipation. You may need to take steps to prevent or treat constipation, such as:  ? Take an over-the-counter or prescription medicine for constipation.  ? Eat   foods that are high in fiber such as whole grains and beans.  ? Limit foods that are high in fat and processed sugars, such as fried or sweet foods.  · Do not use any products that contain nicotine or tobacco, such as cigarettes, e-cigarettes, and chewing tobacco. If you need help quitting, ask your health care provider.  · Get plenty of rest.  · If directed, check your blood sugar at home as told by your health care provider.  · Keep all follow-up visits as told by your health care provider. This is important.  Contact a health care provider if you:  · Do not recover  as quickly as expected.  · Develop new or worsening symptoms.  · Have persistent pain, weakness, or nausea.  · Recover and then have another episode of pain.  · Have a fever.  Get help right away if:  · You cannot eat or keep fluids down.  · Your pain becomes severe.  · Your skin or the white part of your eyes turns yellow (jaundice).  · You have sudden swelling in your abdomen.  · You vomit.  · You feel dizzy or you faint.  · Your blood sugar is high (over 300 mg/dL).  Summary  · Acute pancreatitis happens when inflammation of the pancreas suddenly occurs and the pancreas becomes irritated and swollen.  · This condition is typically caused by alcohol abuse, drug abuse, or gallstones.  · Treatment for this condition usually requires a stay in the hospital.  This information is not intended to replace advice given to you by your health care provider. Make sure you discuss any questions you have with your health care provider.  Document Revised: 07/20/2018 Document Reviewed: 04/06/2018  Elsevier Patient Education © 2021 [employer].

## 2021-03-02 NOTE — Discharge Summary (Signed)
Physician Discharge Summary  Danielle Harrison ZDG:[id number] DOB: 1976-02-26 DOA: 02/27/2021  PCP: [facility]  Admit date: 02/27/2021 Discharge date: 03/02/2021  Admitted From: Home Disposition: Home  Recommendations for Outpatient Follow-up:  1. Follow up with PCP in 1-2 weeks 2.   Home Health: No Equipment/Devices: None Discharge Condition: Stable CODE STATUS: Full Diet recommendation: Bland Brief/Interim Summary:Danielle L Coxis a 44 y.o.femalewith medical history significant forAsthma, GERD and chronic pain as well as recurrent abdominal pain, who presents to the ER, with intermittent abdominal pain for the past 5 days which has now become more persistent.  Her work-up was significant for elevated lipase, and CT abdomen pelvis significant for acute pancreatitis, patient was admitted for further management.  Proceed with bowel rest, pain control, IV fluids.  Patient gradually improved.  Lipase downtrending.  IgG4 pending at time of discharge as exact etiology of pancreatitis is unknown.  Denies overt alcohol history.  No evidence of gallstones.  At time of discharge will recommend she hold her home statin until her symptoms have entirely resolved.  She can see her PCP for follow-up and discuss reintroduction of this medication.  She also had refractory hypertension during this admission.  At time of discharge will recommend amlodipine 10 mg daily and follow-up with PCP.  Will not start additional agents at this time as blood pressure could have been elevated secondary to IV fluids, pain, physiologic stress.   Discharge Diagnoses:  Principal Problem:   Acute pancreatitis Active Problems:   Chronic pain   Asthma  Acute pancreatitis -No evidence of gallstones on imaging, denies any heavy alcohol use(she only had 2 small glasses of wine last week may be precipitated this episode, but otherwise she is using alcohol seldom), triglyceride mildly elevated, not that high to  explain her acute pancreatitis. Patient symptoms improved on day of discharge.  Tolerating soft diet without issue.  Stable for discharge home.  Recommend as needed Zofran, bland diet, adequate hydration post discharge.  Follow-up PCP 1 to 2 weeks.  Hypertension -Not on any home medications, - blood pressure remains significantly elevated,  Likely augmented by pain, IV fluids, stress of hospitalization.  At time of discharge will de-escalate to amlodipine 10 mg daily.  Patient will likely need antihypertensive therapy.  Will not start additional agents at this time.  Recommend patient see her primary care physician for further blood pressure regimen titration.  Chronic pain -Continue home Neurontin hydrocodone and Flexeril  Asthma -Not acutely exacerbated -Ventolin as needed  Hyperlipidemia -Discontinue statin currently as patient with acute pancreatitis  Discharge Instructions  Discharge Instructions    Diet - low sodium heart healthy   Complete by: As directed    Increase activity slowly   Complete by: As directed      Allergies as of 03/02/2021   No Known Allergies     Medication List    STOP taking these medications   cyclobenzaprine 10 MG tablet Commonly known as: FLEXERIL   Dexilant 30 MG capsule Generic drug: Dexlansoprazole   diclofenac 75 MG EC tablet Commonly known as: VOLTAREN   DULoxetine 30 MG capsule Commonly known as: CYMBALTA   HYDROcodone-acetaminophen 5-325 MG tablet Commonly known as: NORCO/VICODIN   ondansetron 8 MG tablet Commonly known as: ZOFRAN   pramipexole 0.25 MG tablet Commonly known as: MIRAPEX   simvastatin 20 MG tablet Commonly known as: ZOCOR     TAKE these medications   albuterol 108 (90 Base) MCG/ACT inhaler Commonly known as: VENTOLIN HFA SMARTSIG:1 Puff(s)  By Mouth Every 6 Hours PRN   albuterol (2.5 MG/3ML) 0.083% nebulizer solution Commonly known as: PROVENTIL USE CONTENTS IN NEBULIZER UP TO THREE TIMES  DAILY AS NEEDED   amLODipine 10 MG tablet Commonly known as: NORVASC Take 1 tablet (10 mg total) by mouth daily for 3 days. Start taking on: Mar 03, 2021   diclofenac 1.3 % Ptch Commonly known as: FLECTOR SMARTSIG:1 Patch(s) T-DERMAL Twice Daily PRN   gabapentin 300 MG capsule Commonly known as: NEURONTIN Take 300 mg by mouth at bedtime.   hydrOXYzine 25 MG capsule Commonly known as: VISTARIL Take 25 mg by mouth at bedtime.   ondansetron 4 MG disintegrating tablet Commonly known as: Zofran ODT Take 1 tablet (4 mg total) by mouth every 8 (eight) hours as needed.   oxyCODONE 5 MG immediate release tablet Commonly known as: Oxy IR/ROXICODONE Take 1 tablet (5 mg total) by mouth every 6 (six) hours as needed for up to 3 days for severe pain.   phentermine 37.5 MG tablet Commonly known as: ADIPEX-P phentermine 37.5 mg tablet  TAKE 1 TABLET BY MOUTH DAILY IN AM, NO CAFFEINE   Symbicort 160-4.5 MCG/ACT inhaler Generic drug: budesonide-formoterol SMARTSIG:2 Puff(s) By Mouth Twice Daily   Vitamin D (Ergocalciferol) 1.25 MG (50000 UNIT) Caps capsule Commonly known as: DRISDOL Take 50,000 Units by mouth once a week.       No Known Allergies  Consultations:  None   Procedures/Studies: CT ABDOMEN PELVIS WO CONTRAST  Result Date: 02/27/2021 CLINICAL DATA:  Abdominal distension, nausea, vomiting EXAM: CT ABDOMEN AND PELVIS WITHOUT CONTRAST TECHNIQUE: Multidetector CT imaging of the abdomen and pelvis was performed following the standard protocol without IV contrast. COMPARISON:  CT 06/26/2019 FINDINGS: Lower chest: Lung bases are clear. Normal heart size. No pericardial effusion. Hepatobiliary: No worrisome focal liver abnormality is seen. Normal gallbladder. No visible calcified gallstones. No biliary ductal dilatation. Pancreas: Hazy baby pancreatic stranding is noted diffusely with a slightly edematous appearance of the pancreatic parenchyma. No organized collection is seen.  No discernible pancreatic mass or ductal dilatation. Spleen: Normal in size. No concerning splenic lesions. Adrenals/Urinary Tract: Normal adrenal glands. No visible or contour deforming renal lesions. No urolithiasis or hydronephrosis. Urinary bladder is unremarkable for the degree of distention. Stomach/Bowel: Distal esophagus and stomach are unremarkable for degree of distension. Some stranding about the duodenum without significant mural thickening, favored to be secondary to the pancreatic process. No other small bowel thickening dilatation or evidence of obstruction. Normal-appearing noninflamed appendix in the right lower quadrant. Some stranding about the descending colon may be redistributed though slight mural thickening is noted with intramural fatty infiltration. Could be accentuated by underdistention with colitis less favored though not completely excluded. Vascular/Lymphatic: No significant vascular findings are present. No enlarged abdominal or pelvic lymph nodes. Reproductive: Anteverted uterus. Few low-attenuation foci towards the cervix, likely nabothian cysts. Radiolucent tampon in the vaginal canal. No concerning adnexal lesions. Other: Small volume low-attenuation fluid in the deep pelvis, may be redistributed/reactive. Stranding about pancreas as well. No free air. No organized abscess or collection. Postsurgical changes from remote appearing low vertical midline incision. Musculoskeletal: Multilevel degenerative changes are present in the imaged portions of the spine. Grade 2 anterolisthesis L4 on L5 without spondylolysis. Mild degenerative changes in the hips and pelvis. No acute osseous abnormality or suspicious osseous lesion. IMPRESSION: Hazy stranding centered predominantly about the pancreas with some likely redistribution in the retroperitoneum and left pericolic gutter. Appearance could suggest acute interstitial edematous pancreatitis. Recommend correlation  with lipase. Some  intramural fatty infiltration noted within the colon, particularly the descending segment. Thickened appearance of the proximal descending with adjacent stranding favored to be incidental decompression and redistribute fluid with active colitis less favored. Aortic Atherosclerosis (ICD10-I70.0). Electronically Signed   By: Kreg Shropshire M.D.   On: 02/27/2021 18:50    (Echo, Carotid, EGD, Colonoscopy, ERCP)    Subjective: Seen and examined on the day of discharge.  Abdominal pain much improved.  Patient tolerating soft diet without issue.  Stable for discharge home.  Discharge Exam: Vitals:   03/02/21 0830 03/02/21 1135  BP: (!) 157/95 122/83  Pulse: 100 94  Resp: 16 18  Temp: 97.9 F (36.6 C) 97.9 F (36.6 C)  SpO2: 100% 99%   Vitals:   03/02/21 0401 03/02/21 0500 03/02/21 0830 03/02/21 1135  BP: (!) 161/107 (!) 140/106 (!) 157/95 122/83  Pulse: (!) 107  100 94  Resp: 18  16 18   Temp: 98 F (36.7 C)  97.9 F (36.6 C) 97.9 F (36.6 C)  TempSrc: Oral  Oral   SpO2: 100%  100% 99%  Weight:      Height:        General: Pt is alert, awake, not in acute distress Cardiovascular: RRR, S1/S2 +, no rubs, no gallops Respiratory: CTA bilaterally, no wheezing, no rhonchi Abdominal: Soft, NT, ND, bowel sounds + Extremities: no edema, no cyanosis    The results of significant diagnostics from this hospitalization (including imaging, microbiology, ancillary and laboratory) are listed below for reference.     Microbiology: Recent Results (from the past 240 hour(s))  Resp Panel by RT-PCR (Flu A&B, Covid) Nasopharyngeal Swab     Status: None   Collection Time: 02/27/21  4:48 PM   Specimen: Nasopharyngeal Swab; Nasopharyngeal(NP) swabs in vial transport medium  Result Value Ref Range Status   SARS Coronavirus 2 by RT PCR NEGATIVE NEGATIVE Final    Comment: (NOTE) SARS-CoV-2 target nucleic acids are NOT DETECTED.  The SARS-CoV-2 RNA is generally detectable in upper  respiratory specimens during the acute phase of infection. The lowest concentration of SARS-CoV-2 viral copies this assay can detect is 138 copies/mL. A negative result does not preclude SARS-Cov-2 infection and should not be used as the sole basis for treatment or other patient management decisions. A negative result may occur with  improper specimen collection/handling, submission of specimen other than nasopharyngeal swab, presence of viral mutation(s) within the areas targeted by this assay, and inadequate number of viral copies(<138 copies/mL). A negative result must be combined with clinical observations, patient history, and epidemiological information. The expected result is Negative.  Fact Sheet for Patients:  03/01/21  Fact Sheet for Healthcare Providers:  BloggerCourse.com  This test is no t yet approved or cleared by the [place] FDA and  has been authorized for detection and/or diagnosis of SARS-CoV-2 by FDA under an Emergency Use Authorization (EUA). This EUA will remain  in effect (meaning this test can be used) for the duration of the COVID-19 declaration under Section 564(b)(1) of the Act, 21 U.S.C.section 360bbb-3(b)(1), unless the authorization is terminated  or revoked sooner.       Influenza A by PCR NEGATIVE NEGATIVE Final   Influenza B by PCR NEGATIVE NEGATIVE Final    Comment: (NOTE) The Xpert Xpress SARS-CoV-2/FLU/RSV plus assay is intended as an aid in the diagnosis of influenza from Nasopharyngeal swab specimens and should not be used as a sole basis for treatment. Nasal washings and aspirates are unacceptable  for Xpert Xpress SARS-CoV-2/FLU/RSV testing.  Fact Sheet for Patients: BloggerCourse.com  Fact Sheet for Healthcare Providers: SeriousBroker.it  This test is not yet approved or cleared by the [place] FDA and has been  authorized for detection and/or diagnosis of SARS-CoV-2 by FDA under an Emergency Use Authorization (EUA). This EUA will remain in effect (meaning this test can be used) for the duration of the COVID-19 declaration under Section 564(b)(1) of the Act, 21 U.S.C. section 360bbb-3(b)(1), unless the authorization is terminated or revoked.  Performed at [facility], [address] [place], [place] [address]      Labs: BNP (last 3 results) No results for input(s): BNP in the last 8760 hours. Basic Metabolic Panel: Recent Labs  Lab 02/27/21 1648 02/27/21 1813 02/28/21 0436 03/01/21 0357 03/02/21 0433  NA 131*  --  139 139 136  K 3.0*  --  3.4* 3.8 3.2*  CL 100  --  109 105 100  CO2 21*  --  21* 25 24  GLUCOSE 126*  --  102* 119* 128*  BUN 11  --  <5* <5* <5*  CREATININE 0.58  --  0.46 0.45 0.43*  CALCIUM 9.1  --  8.9 9.4 9.7  MG  --  1.8  --   --   --    Liver Function Tests: Recent Labs  Lab 02/27/21 1648 02/28/21 0436 03/02/21 0433  AST 18 13* 13*  ALT ALKPHOS 81 69 65  BILITOT 0.8 0.7 0.8  PROT 7.4 6.9 7.2  ALBUMIN 4.1 3.7 3.8   Recent Labs  Lab 02/27/21 1648 02/28/21 0436 03/01/21 0357 03/02/21 0433  LIPASE 359* 147* 119* 102*   No results for input(s): AMMONIA in the last 168 hours. CBC: Recent Labs  Lab 02/27/21 1648 02/28/21 0436 03/01/21 0357 03/02/21 0433  WBC 11.0* 8.3 8.3 7.4  NEUTROABS 9.3*  --   --   --   HGB 13.6 12.9 13.9 14.1  HCT 39.3 37.3 39.2 39.1  MCV 105.6* 105.1* 104.0* 101.6*  PLT 243 216 278 304   Cardiac Enzymes: No results for input(s): CKTOTAL, CKMB, CKMBINDEX, TROPONINI in the last 168 hours. BNP: Invalid input(s): POCBNP CBG: No results for input(s): GLUCAP in the last 168 hours. D-Dimer No results for input(s): DDIMER in the last 72 hours. Hgb A1c No results for input(s): HGBA1C in the last 72 hours. Lipid Profile Recent Labs    02/28/21 0436  TRIG 186*   Thyroid function studies No  results for input(s): TSH, T4TOTAL, T3FREE, THYROIDAB in the last 72 hours.  Invalid input(s): FREET3 Anemia work up No results for input(s): VITAMINB12, FOLATE, FERRITIN, TIBC, IRON, RETICCTPCT in the last 72 hours. Urinalysis    Component Value Date/Time   COLORURINE STRAW (A) 02/27/2021 1757   APPEARANCEUR CLEAR (A) 02/27/2021 1757   APPEARANCEUR Clear 03/01/2014 1301   LABSPEC 1.004 (L) 02/27/2021 1757   LABSPEC 1.012 03/01/2014 1301   PHURINE 6.0 02/27/2021 1757   GLUCOSEU NEGATIVE 02/27/2021 1757   GLUCOSEU 50 mg/dL 78/29/5621 3086   HGBUR LARGE (A) 02/27/2021 1757   BILIRUBINUR NEGATIVE 02/27/2021 1757   BILIRUBINUR Negative 03/01/2014 1301   KETONESUR NEGATIVE 02/27/2021 1757   PROTEINUR NEGATIVE 02/27/2021 1757   NITRITE NEGATIVE 02/27/2021 1757   LEUKOCYTESUR NEGATIVE 02/27/2021 1757   LEUKOCYTESUR Negative 03/01/2014 1301   Sepsis Labs Invalid input(s): PROCALCITONIN,  WBC,  LACTICIDVEN Microbiology Recent Results (from the past 240 hour(s))  Resp Panel by RT-PCR (Flu A&B, Covid) Nasopharyngeal  Swab     Status: None   Collection Time: 02/27/21  4:48 PM   Specimen: Nasopharyngeal Swab; Nasopharyngeal(NP) swabs in vial transport medium  Result Value Ref Range Status   SARS Coronavirus 2 by RT PCR NEGATIVE NEGATIVE Final    Comment: (NOTE) SARS-CoV-2 target nucleic acids are NOT DETECTED.  The SARS-CoV-2 RNA is generally detectable in upper respiratory specimens during the acute phase of infection. The lowest concentration of SARS-CoV-2 viral copies this assay can detect is 138 copies/mL. A negative result does not preclude SARS-Cov-2 infection and should not be used as the sole basis for treatment or other patient management decisions. A negative result may occur with  improper specimen collection/handling, submission of specimen other than nasopharyngeal swab, presence of viral mutation(s) within the areas targeted by this assay, and inadequate number of  viral copies(<138 copies/mL). A negative result must be combined with clinical observations, patient history, and epidemiological information. The expected result is Negative.  Fact Sheet for Patients:  BloggerCourse.comhttps://www.fda.gov/media/152166/download  Fact Sheet for Healthcare Providers:  SeriousBroker.ithttps://www.fda.gov/media/152162/download  This test is no t yet approved or cleared by the [place] FDA and  has been authorized for detection and/or diagnosis of SARS-CoV-2 by FDA under an Emergency Use Authorization (EUA). This EUA will remain  in effect (meaning this test can be used) for the duration of the COVID-19 declaration under Section 564(b)(1) of the Act, 21 U.S.C.section 360bbb-3(b)(1), unless the authorization is terminated  or revoked sooner.       Influenza A by PCR NEGATIVE NEGATIVE Final   Influenza B by PCR NEGATIVE NEGATIVE Final    Comment: (NOTE) The Xpert Xpress SARS-CoV-2/FLU/RSV plus assay is intended as an aid in the diagnosis of influenza from Nasopharyngeal swab specimens and should not be used as a sole basis for treatment. Nasal washings and aspirates are unacceptable for Xpert Xpress SARS-CoV-2/FLU/RSV testing.  Fact Sheet for Patients: BloggerCourse.comhttps://www.fda.gov/media/152166/download  Fact Sheet for Healthcare Providers: SeriousBroker.ithttps://www.fda.gov/media/152162/download  This test is not yet approved or cleared by the [place] FDA and has been authorized for detection and/or diagnosis of SARS-CoV-2 by FDA under an Emergency Use Authorization (EUA). This EUA will remain in effect (meaning this test can be used) for the duration of the COVID-19 declaration under Section 564(b)(1) of the Act, 21 U.S.C. section 360bbb-3(b)(1), unless the authorization is terminated or revoked.  Performed at [facility], [address] [place], [place] [id number]      Time coordinating discharge: Over 30 minutes  SIGNED:   Tresa MooreSudheer B Britani Beattie, MD  [facility] 03/02/2021, 11:39 AM Pager   If 7PM-7AM, please contact night-coverage

## 2021-03-02 NOTE — Plan of Care (Signed)

## 2021-06-10 ENCOUNTER — Emergency Department
Admission: EM | Admit: 2021-06-10 | Discharge: 2021-06-10 | Disposition: A | Discharge status: Home / Self Care | Payer: 59 | Attending: Emergency Medicine | Admitting: Emergency Medicine

## 2021-06-10 ENCOUNTER — Other Ambulatory Visit: Payer: Self-pay

## 2021-06-10 DIAGNOSIS — R531 Weakness: Secondary | ICD-10-CM | POA: Insufficient documentation

## 2021-06-10 DIAGNOSIS — Z7951 Long term (current) use of inhaled steroids: Secondary | ICD-10-CM | POA: Insufficient documentation

## 2021-06-10 DIAGNOSIS — R42 Dizziness and giddiness: Secondary | ICD-10-CM | POA: Insufficient documentation

## 2021-06-10 DIAGNOSIS — R0602 Shortness of breath: Secondary | ICD-10-CM | POA: Insufficient documentation

## 2021-06-10 DIAGNOSIS — L509 Urticaria, unspecified: Secondary | ICD-10-CM | POA: Diagnosis not present

## 2021-06-10 DIAGNOSIS — T7840XA Allergy, unspecified, initial encounter: Secondary | ICD-10-CM | POA: Diagnosis present

## 2021-06-10 DIAGNOSIS — T782XXA Anaphylactic shock, unspecified, initial encounter: Secondary | ICD-10-CM | POA: Diagnosis not present

## 2021-06-10 DIAGNOSIS — J45909 Unspecified asthma, uncomplicated: Secondary | ICD-10-CM | POA: Insufficient documentation

## 2021-06-10 DIAGNOSIS — F17210 Nicotine dependence, cigarettes, uncomplicated: Secondary | ICD-10-CM | POA: Diagnosis not present

## 2021-06-10 MED ORDER — DICYCLOMINE HCL 10 MG PO CAPS: 10.0000 mg | ORAL_CAPSULE | Freq: Four times a day (QID) | ORAL | 0 refills | Status: DC

## 2021-06-10 MED ORDER — PREDNISONE 20 MG PO TABS
60.0000 mg | ORAL_TABLET | Freq: Once | ORAL | Status: AC
  Administered 2021-06-10: 60 mg via ORAL
  Filled 2021-06-10: qty 3

## 2021-06-10 MED ORDER — EPINEPHRINE 0.3 MG/0.3ML IJ SOAJ: 0.3000 mg | Freq: Once | INTRAMUSCULAR | 0 refills | Status: AC

## 2021-06-10 MED ORDER — DICYCLOMINE HCL 10 MG PO CAPS
10.0000 mg | ORAL_CAPSULE | Freq: Once | ORAL | Status: AC
  Administered 2021-06-10: 10 mg via ORAL
  Filled 2021-06-10: qty 1

## 2021-06-10 MED ORDER — CETIRIZINE HCL 10 MG PO TABS: 40.0000 mg | ORAL_TABLET | Freq: Every day | ORAL | 0 refills | Status: DC

## 2021-06-10 MED ORDER — FAMOTIDINE 20 MG PO TABS: 20.0000 mg | ORAL_TABLET | Freq: Every day | ORAL | 0 refills | Status: DC

## 2021-06-10 MED ORDER — PREDNISONE 50 MG PO TABS: 50.0000 mg | ORAL_TABLET | Freq: Every day | ORAL | 0 refills | Status: DC

## 2021-06-10 MED ORDER — FAMOTIDINE 20 MG PO TABS
20.0000 mg | ORAL_TABLET | Freq: Once | ORAL | Status: AC
  Administered 2021-06-10: 20 mg via ORAL
  Filled 2021-06-10: qty 1

## 2021-06-10 MED ORDER — PANTOPRAZOLE SODIUM 40 MG PO TBEC
40.0000 mg | DELAYED_RELEASE_TABLET | Freq: Once | ORAL | Status: AC
  Administered 2021-06-10: 40 mg via ORAL
  Filled 2021-06-10: qty 1

## 2021-06-10 NOTE — ED Triage Notes (Signed)
Pt arrives via EMS from home after having an allergic reaction- pt got SHOB, hives, low BP, and wheezing- pt was 80% on RA- pt got 0.3 epi in the R deltoid- pt was also given 1 albuterol treatment, 125 solumedrol IV, and pepcid IV- pt took one 25mg  benadryl prior to arrival of EMS- unknown cause for the reaction- pt still has hives

## 2021-06-10 NOTE — ED Notes (Signed)
Pt up to restroom.

## 2021-06-10 NOTE — ED Provider Notes (Signed)
[facility] Emergency Department Provider Note  ____________________________________________  Time seen: Approximately 11:33 AM  I have reviewed the triage vital signs and the nursing notes.   HISTORY  Chief Complaint Allergic Reaction    HPI Danielle Harrison is a 45 y.o. female who presents the emergency department after developing symptoms of an allergic reaction.  Patient states that she not been feeling well after being [place].  She had had very limited appetite, came home early from the beach.  She states that she got up this morning, ate something, took a bath, went out to walk in her backyard when she started to have pruritus.  Patient states that she continued to her flower beds, then started to develop hives and shortness of breath.  Patient took some Benadryl but had worsening symptoms to include shortness of breath, lightheaded and weakness in addition to her pruritus and hives.  EMS was called.  Patient was found to be hypotensive with generalized hives and wheezing.  Patient was given Solu-Medrol, fluids, famotidine and epi.  Patient states that she has had a significant improvement of symptoms at this time.  She no longer feels short of breath.  She states that the hives have for the most part resolved and that she has some very mild pruritus still.  No shortness of breath.  No nausea, emesis, diarrhea at this time.  Patient states that she has not been exposed any new topicals, beauty products, foods.  She states that she has been out in her garden with her flowers thousands of times without any difficulty.  She is unsure of the trigger and has never had an allergic reactions in the past.       Past Medical History:  Diagnosis Date   Anxiety    Arthritis    back   Asthma    Neuromuscular disorder (HCC)    weakness and numbness    Restless leg syndrome     Patient Active Problem List   Diagnosis  Date Noted   Acute pancreatitis 02/27/2021   Chronic pain 02/27/2021   Asthma 02/27/2021   Stomach ache    Diarrhea    Stomach irritation     Past Surgical History:  Procedure Laterality Date   BIOPSY  06/28/2019   Procedure: BIOPSY;  Surgeon: Pasty Spillersahiliani, Varnita B, MD;  [facility];  Service: Endoscopy;;   CESAREAN SECTION     x2   COLONOSCOPY WITH PROPOFOL N/A 06/28/2019   Procedure: COLONOSCOPY WITH PROPOFOL;  Surgeon: Pasty Spillersahiliani, Varnita B, MD;  [facility];  Service: Endoscopy;  Laterality: N/A;   ESOPHAGOGASTRODUODENOSCOPY (EGD) WITH PROPOFOL N/A 06/28/2019   Procedure: ESOPHAGOGASTRODUODENOSCOPY (EGD) WITH PROPOFOL;  Surgeon: Pasty Spillersahiliani, Varnita B, MD;  [facility];  Service: Endoscopy;  Laterality: N/A;    Prior to Admission medications   Medication Sig Start Date End Date Taking? Authorizing Provider  cetirizine (ZYRTEC) 10 MG tablet Take 4 tablets (40 mg total) by mouth daily for 5 days. 06/10/21 06/15/21 Yes Floy Riegler, Delorise RoyalsJonathan D, PA-C  dicyclomine (BENTYL) 10 MG capsule Take 1 capsule (10 mg total) by mouth 4 (four) times daily for 7 days. 06/10/21 06/17/21 Yes Maksymilian Mabey, Delorise RoyalsJonathan D, PA-C  EPINEPHrine 0.3 mg/0.3 mL IJ SOAJ injection Inject 0.3 mg into the muscle once for 1 dose. 06/10/21 06/10/21 Yes Thanya Cegielski, Delorise RoyalsJonathan D, PA-C  famotidine (PEPCID) 20 MG tablet Take 1 tablet (20 mg total) by mouth daily.  06/10/21 06/10/22 Yes Puanani Gene, Delorise Royals, PA-C  predniSONE (DELTASONE) 50 MG tablet Take 1 tablet (50 mg total) by mouth daily with breakfast. 06/10/21  Yes Roma Bierlein, Delorise Royals, PA-C  albuterol (PROVENTIL) (2.5 MG/3ML) 0.083% nebulizer solution USE CONTENTS IN NEBULIZER UP TO THREE TIMES DAILY AS NEEDED 01/03/20   [provider]  albuterol (VENTOLIN HFA) 108 (90 Base) MCG/ACT inhaler SMARTSIG:1 Puff(s) By Mouth Every 6 Hours PRN 12/28/19   [provider]  amLODipine (NORVASC) 10 MG tablet Take 1 tablet (10 mg total)  by mouth daily for 3 days. 03/03/21 03/06/21  Tresa Moore, MD  diclofenac (FLECTOR) 1.3 % PTCH SMARTSIG:1 Patch(s) T-DERMAL Twice Daily PRN 01/29/21   [provider]  gabapentin (NEURONTIN) 300 MG capsule Take 300 mg by mouth at bedtime. 01/10/20   [provider]  hydrOXYzine (VISTARIL) 25 MG capsule Take 25 mg by mouth at bedtime.    [provider]  ondansetron (ZOFRAN ODT) 4 MG disintegrating tablet Take 1 tablet (4 mg total) by mouth every 8 (eight) hours as needed. 03/02/21   Tresa Moore, MD  phentermine (ADIPEX-P) 37.5 MG tablet phentermine 37.5 mg tablet  TAKE 1 TABLET BY MOUTH DAILY IN AM, NO CAFFEINE    [provider]  SYMBICORT 160-4.5 MCG/ACT inhaler SMARTSIG:2 Puff(s) By Mouth Twice Daily 01/07/20   [provider]  Vitamin D, Ergocalciferol, (DRISDOL) 1.25 MG (50000 UNIT) CAPS capsule Take 50,000 Units by mouth once a week. 12/21/19   [provider]    Allergies Patient has no known allergies.  Family History  Problem Relation Age of Onset   Colon cancer Neg Hx     Social History Social History   Tobacco Use   Smoking status: Every Day    Packs/day: 0.50    Years: 6.00    Pack years: 3.00    Types: Cigarettes   Smokeless tobacco: Never  Substance Use Topics   Alcohol use: Yes    Comment: occasionally     Review of Systems  Constitutional: No fever/chills Eyes: No visual changes. No discharge ENT: No upper respiratory complaints. Cardiovascular: no chest pain. Respiratory: Positive for shortness of breath and wheezing at the time of reaction Gastrointestinal: No abdominal pain.  No nausea, no vomiting.  No diarrhea.  No constipation. Musculoskeletal: Negative for musculoskeletal pain. Skin: Generalized pruritus and hives Neurological: Negative for headaches, focal weakness or numbness.  10 System ROS otherwise negative.  ____________________________________________   PHYSICAL  EXAM:  VITAL SIGNS: ED Triage Vitals  Enc Vitals Group     BP 06/10/21 1112 (!) 137/98     Pulse Rate 06/10/21 1112 (!) 117     Resp 06/10/21 1112 20     Temp 06/10/21 1113 (!) 97.5 F (36.4 C)     Temp Source 06/10/21 1113 Oral     SpO2 06/10/21 1112 100 %     Weight 06/10/21 1113 116 lb (52.6 kg)     Height 06/10/21 1113 5' (1.524 m)     Head Circumference --      Peak Flow --      Pain Score 06/10/21 1113 0     Pain Loc --      Pain Edu? --      Excl. in GC? --      Constitutional: Alert and oriented. Well appearing and in no acute distress. Eyes: Conjunctivae are normal. PERRL. EOMI. Head: Atraumatic. ENT:      Ears:  Nose: No congestion/rhinnorhea.      Mouth/Throat: Mucous membranes are moist.  No angioedema, no oropharyngeal erythema Neck: No stridor.  Hematological/Lymphatic/Immunilogical: No cervical lymphadenopathy. Cardiovascular: Normal rate, regular rhythm. Normal S1 and S2.  Good peripheral circulation. Respiratory: Normal respiratory effort without tachypnea or retractions. Lungs CTAB. Good air entry to the bases with no decreased or absent breath sounds. Gastrointestinal: Bowel sounds 4 quadrants. Soft and nontender to palpation. No guarding or rigidity. No palpable masses. No distention. No CVA tenderness. Musculoskeletal: Full range of motion to all extremities. No gross deformities appreciated. Neurologic:  Normal speech and language. No gross focal neurologic deficits are appreciated.  Skin:  Skin is warm, dry and intact. No rash noted. Psychiatric: Mood and affect are normal. Speech and behavior are normal. Patient exhibits appropriate insight and judgement.   ____________________________________________   LABS (all labs ordered are listed, but only abnormal results are displayed)  Labs Reviewed - No data to display ____________________________________________  EKG   ____________________________________________  RADIOLOGY   No  results found.  ____________________________________________    PROCEDURES  Procedure(s) performed:    Procedures    Medications  famotidine (PEPCID) tablet 20 mg (20 mg Oral Given 06/10/21 1626)  dicyclomine (BENTYL) capsule 10 mg (10 mg Oral Given 06/10/21 1626)  pantoprazole (PROTONIX) EC tablet 40 mg (40 mg Oral Given 06/10/21 1626)  predniSONE (DELTASONE) tablet 60 mg (60 mg Oral Given 06/10/21 1626)     ____________________________________________   INITIAL IMPRESSION / ASSESSMENT AND PLAN / ED COURSE  Pertinent labs & imaging results that were available during my care of the patient were reviewed by me and considered in my medical decision making (see chart for details).  Review of the [place] CSRS was performed in accordance of the [facility] prior to dispensing any controlled drugs.           Patient's diagnosis is consistent with anaphylactic reaction.  Patient presented to the emergency department via EMS from home for what appears to be an anaphylactic reaction.  Patient had full body hives, wheezing, shortness of breath, hypotension and had been given steroids, famotidine, epi by EMS in route with significant improvement of her symptoms.  On initial exam patient has some mild pruritus but rash had essentially resolved, there was no ongoing wheezing.  Given the fact that the patient had anaphylaxis and had IV administered patient was observed in the emergency department for period of time.  No return of symptoms.  Patient is remained stable in regards to her cardiac status with no evidence of arrhythmias secondary to epi use..  At this time patient Danielle be given prescriptions for at home use to include some medications for GERD which the patient is suffering from, as well as medications to ensure no return of symptoms.  She Danielle have an EpiPen prescribed and instructions on how to use same are given to the patient.  This time patient is stable for discharge and I have provided  significant return precautions for any new or worsening symptoms.  Unsure the trigger at this time patient has not had any new foods, topicals, medications.  There was no new environment changes.  As we do not know the trigger I have discussed at length my recommendations in regards to preparedness for any other acute reaction.  Patient can also follow-up with ENT or dermatology for skin testing if needed for allergens.  Patient is given ED precautions to return to the ED for any worsening or new symptoms.  ____________________________________________  FINAL CLINICAL IMPRESSION(S) / ED DIAGNOSES  Final diagnoses:  Anaphylaxis, initial encounter      NEW MEDICATIONS STARTED DURING THIS VISIT:  ED Discharge Orders          Ordered    predniSONE (DELTASONE) 50 MG tablet  Daily with breakfast        06/10/21 1621    cetirizine (ZYRTEC) 10 MG tablet  Daily        06/10/21 1621    famotidine (PEPCID) 20 MG tablet  Daily        06/10/21 1621    EPINEPHrine 0.3 mg/0.3 mL IJ SOAJ injection   Once        06/10/21 1621    dicyclomine (BENTYL) 10 MG capsule  4 times daily        06/10/21 1621                This chart was dictated using voice recognition software/Dragon. Despite best efforts to proofread, errors can occur which can change the meaning. Any change was purely unintentional.    Racheal Patches, PA-C 06/10/21 1705    Merwyn Katos, MD 06/11/21 1048

## 2021-06-10 NOTE — ED Notes (Signed)
Expiratory wheezes noted on left side- air way patent

## 2021-06-10 NOTE — ED Notes (Signed)
Pt given crackers, graham crackers and ginger ale per request

## 2021-06-29 ENCOUNTER — Other Ambulatory Visit: Payer: Self-pay

## 2021-06-29 ENCOUNTER — Emergency Department
Admission: EM | Admit: 2021-06-29 | Discharge: 2021-06-29 | Disposition: A | Discharge status: Home / Self Care | Payer: 59 | Attending: Emergency Medicine | Admitting: Emergency Medicine

## 2021-06-29 DIAGNOSIS — T782XXA Anaphylactic shock, unspecified, initial encounter: Secondary | ICD-10-CM

## 2021-06-29 DIAGNOSIS — F17210 Nicotine dependence, cigarettes, uncomplicated: Secondary | ICD-10-CM | POA: Diagnosis not present

## 2021-06-29 DIAGNOSIS — J45909 Unspecified asthma, uncomplicated: Secondary | ICD-10-CM | POA: Diagnosis not present

## 2021-06-29 DIAGNOSIS — Z7951 Long term (current) use of inhaled steroids: Secondary | ICD-10-CM | POA: Insufficient documentation

## 2021-06-29 DIAGNOSIS — T7840XA Allergy, unspecified, initial encounter: Secondary | ICD-10-CM | POA: Insufficient documentation

## 2021-06-29 DIAGNOSIS — R000 Tachycardia, unspecified: Secondary | ICD-10-CM | POA: Diagnosis not present

## 2021-06-29 MED ORDER — PREDNISONE 50 MG PO TABS: 50.0000 mg | ORAL_TABLET | Freq: Every day | ORAL | 0 refills | Status: DC

## 2021-06-29 MED ORDER — EPINEPHRINE 0.3 MG/0.3ML IJ SOAJ: 0.3000 mg | INTRAMUSCULAR | 1 refills | Status: AC | PRN

## 2021-06-29 NOTE — ED Triage Notes (Signed)
Pt to ED via EMS from home, pt states she woke up this morning and noticed she had hives, swelling to her face and sob. Pt had similar episode earlier in august. Pt took epi pen at home and albuterol treatment. Pt was given 20 mg famotidine, 50mg  benadryl, 2g magnesium, 125 solumedrol. Pt states she still feels somewhat short of breath.

## 2021-06-29 NOTE — Discharge Instructions (Addendum)
Please follow-up with an allergist for further testing.  Another EpiPen was sent to your pharmacy.  If you develop worsening shortness of breath, throat swelling or difficulty swallowing, please return to the emergency department.

## 2021-06-29 NOTE — ED Provider Notes (Signed)
[facility]  ____________________________________________   Event Date/Time   First MD Initiated Contact with Patient 06/29/21 0543     (approximate)  I have reviewed the triage vital signs and the nursing notes.   HISTORY  Chief Complaint Allergic Reaction    HPI Danielle Harrison is a 45 y.o. female with past medical history of anxiety, asthma who presents with a allergic reaction.  Patient awoke this morning around 4 AM when she started having difficulty breathing.  When she went and saw herself in the mirror she noticed that her face looked swollen.  She took her EpiPen around 5 AM.  She denies nausea vomiting or diarrhea.  She denies any urticaria.  She denies throat swelling or difficulty swallowing.  Patient had a similar reaction several weeks ago which was unprovoked.  She attributes the initial reaction to [place] days prior to that initial reaction.  He denies any known allergies or other prior history of anaphylaxis.     Past Medical History:  Diagnosis Date   Anxiety    Arthritis    back   Asthma    Neuromuscular disorder (HCC)    weakness and numbness    Restless leg syndrome     Patient Active Problem List   Diagnosis Date Noted   Acute pancreatitis 02/27/2021   Chronic pain 02/27/2021   Asthma 02/27/2021   Stomach ache    Diarrhea    Stomach irritation     Past Surgical History:  Procedure Laterality Date   BIOPSY  06/28/2019   Procedure: BIOPSY;  Surgeon: Pasty Spillers, MD;  [facility];  Service: Endoscopy;;   CESAREAN SECTION     x2   COLONOSCOPY WITH PROPOFOL N/A 06/28/2019   Procedure: COLONOSCOPY WITH PROPOFOL;  Surgeon: Pasty Spillers, MD;  [facility];  Service: Endoscopy;  Laterality: N/A;   ESOPHAGOGASTRODUODENOSCOPY (EGD) WITH PROPOFOL N/A 06/28/2019   Procedure: ESOPHAGOGASTRODUODENOSCOPY (EGD) WITH PROPOFOL;  Surgeon: Pasty Spillers, MD;  Location: [facility];  Service: Endoscopy;  Laterality: N/A;    Prior to Admission medications   Medication Sig Start Date End Date Taking? Authorizing Provider  albuterol (PROVENTIL) (2.5 MG/3ML) 0.083% nebulizer solution USE CONTENTS IN NEBULIZER UP TO THREE TIMES DAILY AS NEEDED 01/03/20   [provider]  albuterol (VENTOLIN HFA) 108 (90 Base) MCG/ACT inhaler SMARTSIG:1 Puff(s) By Mouth Every 6 Hours PRN 12/28/19   [provider]  amLODipine (NORVASC) 10 MG tablet Take 1 tablet (10 mg total) by mouth daily for 3 days. 03/03/21 03/06/21  Tresa Moore, MD  cetirizine (ZYRTEC) 10 MG tablet Take 4 tablets (40 mg total) by mouth daily for 5 days. 06/10/21 06/15/21  Cuthriell, Delorise Royals, PA-C  diclofenac (FLECTOR) 1.3 % PTCH SMARTSIG:1 Patch(s) T-DERMAL Twice Daily PRN 01/29/21   [provider]  dicyclomine (BENTYL) 10 MG capsule Take 1 capsule (10 mg total) by mouth 4 (four) times daily for 7 days. 06/10/21 06/17/21  Cuthriell, Delorise Royals, PA-C  famotidine (PEPCID) 20 MG tablet Take 1 tablet (20 mg total) by mouth daily. 06/10/21 06/10/22  Cuthriell, Delorise Royals, PA-C  gabapentin (NEURONTIN) 300 MG capsule Take 300 mg by mouth at bedtime. 01/10/20   [provider]  hydrOXYzine (VISTARIL) 25 MG capsule Take 25 mg by mouth at bedtime.    [provider]  ondansetron (ZOFRAN ODT) 4 MG disintegrating tablet Take 1 tablet (4 mg total) by mouth  every 8 (eight) hours as needed. 03/02/21   Tresa Moore, MD  phentermine (ADIPEX-P) 37.5 MG tablet phentermine 37.5 mg tablet  TAKE 1 TABLET BY MOUTH DAILY IN AM, NO CAFFEINE    [provider]  predniSONE (DELTASONE) 50 MG tablet Take 1 tablet (50 mg total) by mouth daily with breakfast. 06/10/21   Cuthriell, Delorise Royals, PA-C  SYMBICORT 160-4.5 MCG/ACT inhaler SMARTSIG:2 Puff(s) By Mouth Twice Daily 01/07/20   [provider]  Vitamin D, Ergocalciferol, (DRISDOL) 1.25 MG  (50000 UNIT) CAPS capsule Take 50,000 Units by mouth once a week. 12/21/19   [provider]    Allergies Patient has no known allergies.  Family History  Problem Relation Age of Onset   Colon cancer Neg Hx     Social History Social History   Tobacco Use   Smoking status: Every Day    Packs/day: 0.50    Years: 6.00    Pack years: 3.00    Types: Cigarettes   Smokeless tobacco: Never  Substance Use Topics   Alcohol use: Yes    Comment: occasionally    Review of Systems   Review of Systems  HENT:  Positive for facial swelling. Negative for trouble swallowing and voice change.   Respiratory:  Positive for cough and shortness of breath.   Gastrointestinal:  Negative for abdominal pain, diarrhea, nausea and vomiting.  All other systems reviewed and are negative.  Physical Exam Updated Vital Signs BP 139/90   Pulse (!) 120   Temp 98.2 F (36.8 C) (Oral)   Resp 20   Ht 5' (1.524 m)   Wt 52.6 kg   SpO2 99%   BMI 22.65 kg/m   Physical Exam Vitals and nursing note reviewed.  Constitutional:      General: She is not in acute distress.    Appearance: Normal appearance.     Comments: Anxious appearing  HENT:     Head: Normocephalic and atraumatic.  Eyes:     General: No scleral icterus.    Conjunctiva/sclera: Conjunctivae normal.  Cardiovascular:     Rate and Rhythm: Tachycardia present.  Pulmonary:     Effort: Pulmonary effort is normal. No respiratory distress.     Breath sounds: No stridor. No wheezing.  Musculoskeletal:        General: No deformity or signs of injury.     Cervical back: Normal range of motion.  Skin:    General: Skin is dry.     Coloration: Skin is not jaundiced or pale.     Comments: Mild periorbital edema, no tongue swelling, posterior oropharynx is clear  Neurological:     General: No focal deficit present.     Mental Status: She is alert and oriented to person, place, and time. Mental status is at baseline.  Psychiatric:         Mood and Affect: Mood normal.        Behavior: Behavior normal.     LABS (all labs ordered are listed, but only abnormal results are displayed)  Labs Reviewed - No data to display ____________________________________________  EKG  N/a ____________________________________________  RADIOLOGY Ky Barban, personally viewed and evaluated these images (plain radiographs) as part of my medical decision making, as well as reviewing the written report by the radiologist.  ED MD interpretation:  n/a    ____________________________________________   PROCEDURES  Procedure(s) performed (including Critical Care):  Procedures   ____________________________________________   INITIAL IMPRESSION / ASSESSMENT AND PLAN / ED  COURSE     Patient is a 45 year old female presents with a likely anaphylactic reaction.  Somewhat unusual as there is no clear precipitant and occurred upon waking.  Her main symptoms are facial swelling and difficulty breathing.  Per EMS she was wheezing on their arrival.  She received epinephrine IM which she administered herself followed by 20 mg of Pepcid, milligrams of Benadryl and 125 mg of Solu-Medrol by EMS as well as 2 g of mag.  On initial ED arrival she is satting normally not in any distress but is tachycardic.  She does have some facial swelling but no tongue or oral swelling.  She is no longer wheezing.  No indication for ongoing treatment at this time but we will continue to monitor until around 8 AM.  Will refer patient to allergist given this is the second time she has had a reaction like this.  Will refill EpiPen.  Repeat evaluation patient continues to be stable.  She has no wheezing.  Will observe until least 8 AM.  Patient signed out pending reassessment.      ____________________________________________   FINAL CLINICAL IMPRESSION(S) / ED DIAGNOSES  Final diagnoses:  None     ED Discharge Orders     None        Note:   This document was prepared using Dragon voice recognition software and may include unintentional dictation errors.    Georga Hacking, MD 06/29/21 [phone]

## 2022-01-29 ENCOUNTER — Inpatient Hospital Stay
Admission: EM | Admit: 2022-01-29 | Discharge: 2022-02-01 | DRG: 439 | Disposition: A | Discharge status: Home / Self Care | Payer: 59 | Attending: Internal Medicine | Admitting: Internal Medicine

## 2022-01-29 ENCOUNTER — Other Ambulatory Visit: Payer: Self-pay

## 2022-01-29 DIAGNOSIS — K859 Acute pancreatitis without necrosis or infection, unspecified: Principal | ICD-10-CM | POA: Diagnosis present

## 2022-01-29 DIAGNOSIS — Z7952 Long term (current) use of systemic steroids: Secondary | ICD-10-CM

## 2022-01-29 DIAGNOSIS — E876 Hypokalemia: Secondary | ICD-10-CM | POA: Diagnosis not present

## 2022-01-29 DIAGNOSIS — I10 Essential (primary) hypertension: Secondary | ICD-10-CM | POA: Diagnosis present

## 2022-01-29 DIAGNOSIS — Z7951 Long term (current) use of inhaled steroids: Secondary | ICD-10-CM

## 2022-01-29 DIAGNOSIS — R109 Unspecified abdominal pain: Secondary | ICD-10-CM | POA: Diagnosis present

## 2022-01-29 DIAGNOSIS — R1030 Lower abdominal pain, unspecified: Secondary | ICD-10-CM

## 2022-01-29 DIAGNOSIS — J4520 Mild intermittent asthma, uncomplicated: Secondary | ICD-10-CM | POA: Diagnosis present

## 2022-01-29 DIAGNOSIS — E871 Hypo-osmolality and hyponatremia: Secondary | ICD-10-CM | POA: Diagnosis present

## 2022-01-29 DIAGNOSIS — Z87891 Personal history of nicotine dependence: Secondary | ICD-10-CM

## 2022-01-29 DIAGNOSIS — Z79899 Other long term (current) drug therapy: Secondary | ICD-10-CM

## 2022-01-29 DIAGNOSIS — J45909 Unspecified asthma, uncomplicated: Secondary | ICD-10-CM | POA: Diagnosis present

## 2022-01-29 LAB — URINALYSIS, ROUTINE W REFLEX MICROSCOPIC
Bacteria, UA: NONE SEEN
Bilirubin Urine: NEGATIVE
Glucose, UA: NEGATIVE mg/dL
Ketones, ur: 5 mg/dL — AB
Leukocytes,Ua: NEGATIVE
Nitrite: NEGATIVE
Protein, ur: 100 mg/dL — AB
Specific Gravity, Urine: 1.019 (ref 1.005–1.030)
pH: 5 (ref 5.0–8.0)

## 2022-01-29 LAB — CBC
HCT: 42.9 % (ref 36.0–46.0)
Hemoglobin: 14.5 g/dL (ref 12.0–15.0)
MCH: 32.8 pg (ref 26.0–34.0)
MCHC: 33.8 g/dL (ref 30.0–36.0)
MCV: 97.1 fL (ref 80.0–100.0)
Platelets: 337 10*3/uL (ref 150–400)
RBC: 4.42 MIL/uL (ref 3.87–5.11)
RDW: 12.1 % (ref 11.5–15.5)
WBC: 13.1 10*3/uL — ABNORMAL HIGH (ref 4.0–10.5)
nRBC: 0 % (ref 0.0–0.2)

## 2022-01-29 LAB — COMPREHENSIVE METABOLIC PANEL
ALT: 20 U/L (ref 0–44)
AST: 27 U/L (ref 15–41)
Albumin: 4.4 g/dL (ref 3.5–5.0)
Alkaline Phosphatase: 87 U/L (ref 38–126)
Anion gap: 12 (ref 5–15)
BUN: 13 mg/dL (ref 6–20)
CO2: 22 mmol/L (ref 22–32)
Calcium: 9.3 mg/dL (ref 8.9–10.3)
Chloride: 97 mmol/L — ABNORMAL LOW (ref 98–111)
Creatinine, Ser: 0.67 mg/dL (ref 0.44–1.00)
GFR, Estimated: >60 mL/min (ref >60)
Glucose, Bld: 190 mg/dL — ABNORMAL HIGH (ref 70–99)
Potassium: 3.6 mmol/L (ref 3.5–5.1)
Sodium: 131 mmol/L — ABNORMAL LOW (ref 135–145)
Total Bilirubin: 0.8 mg/dL (ref 0.3–1.2)
Total Protein: 7.8 g/dL (ref 6.5–8.1)

## 2022-01-29 LAB — TSH: TSH: 1.476 u[IU]/mL (ref 0.350–4.500)

## 2022-01-29 LAB — HEMOGLOBIN A1C
Hgb A1c MFr Bld: 5.2 % (ref 4.8–5.6)
Mean Plasma Glucose: 102.54 mg/dL

## 2022-01-29 LAB — LIPID PANEL
Cholesterol: 240 mg/dL — ABNORMAL HIGH (ref 0–200)
HDL: 101 mg/dL (ref >40)
LDL Cholesterol: 98 mg/dL (ref 0–99)
Total CHOL/HDL Ratio: 2.4 RATIO
Triglycerides: 207 mg/dL — ABNORMAL HIGH (ref <150)
VLDL: 41 mg/dL — ABNORMAL HIGH (ref 0–40)

## 2022-01-29 LAB — GAMMA GT: GGT: 44 U/L (ref 7–50)

## 2022-01-29 LAB — LIPASE, BLOOD: Lipase: 785 U/L — ABNORMAL HIGH (ref 11–51)

## 2022-01-29 LAB — T4, FREE: Free T4: 0.68 ng/dL (ref 0.61–1.12)

## 2022-01-29 MED ORDER — ONDANSETRON HCL 4 MG/2ML IJ SOLN
4.0000 mg | Freq: Four times a day (QID) | INTRAMUSCULAR | Status: DC | PRN
  Administered 2022-01-29: 4 mg via INTRAVENOUS

## 2022-01-29 MED ORDER — MORPHINE SULFATE (PF) 2 MG/ML IV SOLN: 2.0000 mg | INTRAVENOUS | Status: DC | PRN

## 2022-01-29 MED ORDER — HYDRALAZINE HCL 20 MG/ML IJ SOLN
5.0000 mg | INTRAMUSCULAR | Status: DC | PRN
  Administered 2022-01-29 – 2022-01-30 (×2): 5 mg via INTRAVENOUS
  Filled 2022-01-29 (×2): qty 1

## 2022-01-29 MED ORDER — ONDANSETRON HCL 4 MG PO TABS: 4.0000 mg | ORAL_TABLET | Freq: Four times a day (QID) | ORAL | Status: DC | PRN

## 2022-01-29 MED ORDER — SODIUM CHLORIDE 0.9 % IV BOLUS
1000.0000 mL | Freq: Once | INTRAVENOUS | Status: AC
  Administered 2022-01-29: 1000 mL via INTRAVENOUS

## 2022-01-29 MED ORDER — HEPARIN SODIUM (PORCINE) 5000 UNIT/ML IJ SOLN
5000.0000 [IU] | Freq: Three times a day (TID) | INTRAMUSCULAR | Status: DC
  Administered 2022-01-29 – 2022-02-01 (×8): 5000 [IU] via SUBCUTANEOUS
  Filled 2022-01-29 (×8): qty 1

## 2022-01-29 MED ORDER — ACETAMINOPHEN 325 MG PO TABS: 650.0000 mg | ORAL_TABLET | Freq: Four times a day (QID) | ORAL | Status: DC | PRN

## 2022-01-29 MED ORDER — LACTATED RINGERS IV SOLN: INTRAVENOUS | Status: AC

## 2022-01-29 MED ORDER — ONDANSETRON HCL 4 MG/2ML IJ SOLN
4.0000 mg | Freq: Once | INTRAMUSCULAR | Status: AC
  Administered 2022-01-29: 4 mg via INTRAVENOUS
  Filled 2022-01-29: qty 2

## 2022-01-29 MED ORDER — ACETAMINOPHEN 650 MG RE SUPP: 650.0000 mg | Freq: Four times a day (QID) | RECTAL | Status: DC | PRN

## 2022-01-29 MED ORDER — MORPHINE SULFATE (PF) 4 MG/ML IV SOLN
4.0000 mg | Freq: Once | INTRAVENOUS | Status: AC
  Administered 2022-01-29: 4 mg via INTRAVENOUS
  Filled 2022-01-29: qty 1

## 2022-01-29 MED ORDER — MORPHINE SULFATE (PF) 2 MG/ML IV SOLN
2.0000 mg | INTRAVENOUS | Status: DC | PRN
  Administered 2022-01-29 – 2022-01-31 (×9): 2 mg via INTRAVENOUS
  Filled 2022-01-29 (×9): qty 1

## 2022-01-29 MED ORDER — PANTOPRAZOLE SODIUM 40 MG IV SOLR
40.0000 mg | Freq: Two times a day (BID) | INTRAVENOUS | Status: DC
Start: 2022-01-29 — End: 2022-02-01
  Administered 2022-01-29 – 2022-02-01 (×6): 40 mg via INTRAVENOUS
  Filled 2022-01-29 (×6): qty 10

## 2022-01-29 NOTE — ED Triage Notes (Signed)
Pt to ED for LUQ abdominal pain that started Sunday and radiates to L mid back. Pt vomited one time on Sunday, and also became increasingly bloated same day. Pt had 2 Bms. ? ?LBM yesterday. No diarrhea. ?Stopped vomiting at noon yesterday. ?Hx pancreatitis. ? ?

## 2022-01-29 NOTE — Assessment & Plan Note (Signed)
Prn ventolin as needed  ?

## 2022-01-29 NOTE — Assessment & Plan Note (Signed)
Stat lipid panel. ?Npo except for sips. ?MIVF. ? ?

## 2022-01-29 NOTE — Assessment & Plan Note (Signed)
10/10 attribute to pancreatitis. ?PRN morphine.  ?

## 2022-01-29 NOTE — ED Notes (Signed)
Informed RN bed assigned 

## 2022-01-29 NOTE — ED Provider Notes (Signed)
? ?[facility] ?Provider Note ? ? ? Event Date/Time  ? First MD Initiated Contact with Patient 01/29/22 1743   ?  (approximate) ? ?History  ? ?Chief Complaint: Abdominal Pain ? ?HPI ? ?Danielle Harrison is a 46 y.o. female with a past medical history of anxiety, prior pancreatitis, presents to the emergency department for upper abdominal pain.  According to the patient for the past 2 days she has been experiencing upper abdominal pain as well as nausea consistent with past episodes of pancreatitis.  Patient states the pain is now worsening 9/10 in severity.  Has had nausea but denies any active vomiting.  Denies any diarrhea last bowel movement was yesterday.  No urinary symptoms.  Patient does admit to drinking alcohol 3 to 4 days a week.  Has had pancreatitis previously to which this feels identical. ? ?Physical Exam  ? ?Triage Vital Signs: ?ED Triage Vitals  ?Enc Vitals Group  ?   BP 01/29/22 1551 (!) 154/100  ?   Pulse Rate 01/29/22 1551 (!) 131  ?   Resp 01/29/22 1551 18  ?   Temp 01/29/22 1551 97.9 ?F (36.6 ?C)  ?   Temp Source 01/29/22 1551 Oral  ?   SpO2 01/29/22 1551 100 %  ?   Weight 01/29/22 1559 122 lb (55.3 kg)  ?   Height 01/29/22 1559 5' (1.524 m)  ?   Head Circumference --   ?   Peak Flow --   ?   Pain Score 01/29/22 1558 10  ?   Pain Loc --   ?   Pain Edu? --   ?   Excl. in GC? --   ? ? ?Most recent vital signs: ?Vitals:  ? 01/29/22 1551  ?BP: (!) 154/100  ?Pulse: (!) 131  ?Resp: 18  ?Temp: 97.9 ?F (36.6 ?C)  ?SpO2: 100%  ? ? ?General: Awake, no distress.  ?CV:  Good peripheral perfusion.  Regular rate and rhythm  ?Resp:  Normal effort.  Equal breath sounds bilaterally.  ?Abd:  Soft, moderate epigastric tenderness palpation without rebound guarding or distention. ? ? ?ED Results / Procedures / Treatments  ? ?EKG ? ?EKG viewed and interpreted by myself shows sinus tachycardia 127 bpm with a narrow QRS, normal axis, normal intervals, no concerning ST changes. ? ? ?MEDICATIONS  ORDERED IN ED: ?Medications  ?morphine (PF) 4 MG/ML injection 4 mg (has no administration in time range)  ?ondansetron (ZOFRAN) injection 4 mg (has no administration in time range)  ?sodium chloride 0.9 % bolus 1,000 mL (has no administration in time range)  ? ? ? ?IMPRESSION / MDM / ASSESSMENT AND PLAN / ED COURSE  ?I reviewed the triage vital signs and the nursing notes. ? ?Patient presents to the emergency department for upper abdominal pain over the past 2 days.  Patient does admit to alcohol 3 to 4 days/week.  Tender across the epigastrium on my examination.  Lab work shows a lipase greater than 700 consistent with acute pancreatitis.  Liver function tests are otherwise normal.  CBC is normal, chemistry shows no concerning findings.  I reviewed the patient's last discharge summary 03/02/2021 which time the patient was admitted for acute pancreatitis as well.  We will treat pain, nausea, IV hydrate.  We will admit to the hospitalist service for ongoing management.  Patient agreeable to plan. ? ?FINAL CLINICAL IMPRESSION(S) / ED DIAGNOSES  ? ?Acute pancreatitis ? ? ?Note:  This document was prepared using [employer]  software and may include unintentional dictation errors. ?  Minna Antis, MD ?01/29/22 1803 ? ?

## 2022-01-29 NOTE — H&P (Signed)
?History and Physical  ? ? ?Patient: Danielle Harrison [id number] DOB: [birth year] ?DOA: 01/29/2022 ?DOS: the patient was seen and examined on 01/29/2022 ?PCP: [facility]  ?Patient coming from: Home ? ?Chief Complaint:  ?Chief Complaint  ?Patient presents with  ? Abdominal Pain  ? ?HPI: Danielle Harrison is a 46 y.o. female with medical history significant of anxiety, arthritis, RLS and pancreatitis.  ?>Stomach pain: ?Since Sunday. ?10/10 ?Progressively worse. ?Radiating to her left abd and back.  ?No alleviating factors. ?No worsening factors. ?Assoc with nausea vomiting. ?Intermittent.  ?She only drinks occasional. ?She smoked and quit last august.  ? ?Review of Systems:  ?Review of Systems  ?Gastrointestinal:  Positive for abdominal pain, nausea and vomiting.  ?All other systems reviewed and are negative. ? ?Past Medical History:  ?Diagnosis Date  ? Anxiety   ? Arthritis   ? back  ? Asthma   ? Neuromuscular disorder ([place])   ? weakness and numbness   ? Restless leg syndrome   ? ?Past Surgical History:  ?Procedure Laterality Date  ? BIOPSY  06/28/2019  ? Procedure: BIOPSY;  Surgeon: Virgel Manifold, MD;  Location: [facility];  Service: Endoscopy;;  ? CESAREAN SECTION    ? x2  ? COLONOSCOPY WITH PROPOFOL N/A 06/28/2019  ? Procedure: COLONOSCOPY WITH PROPOFOL;  Surgeon: Virgel Manifold, MD;  Location: [facility];  Service: Endoscopy;  Laterality: N/A;  ? ESOPHAGOGASTRODUODENOSCOPY (EGD) WITH PROPOFOL N/A 06/28/2019  ? Procedure: ESOPHAGOGASTRODUODENOSCOPY (EGD) WITH PROPOFOL;  Surgeon: Virgel Manifold, MD;  Location: [facility];  Service: Endoscopy;  Laterality: N/A;  ? ?Social History:  reports that she has quit smoking. Her smoking use included cigarettes. She has a 3.00 pack-year smoking history. She has never used smokeless tobacco. She reports current alcohol use. She reports that she does not use drugs. ? ?No Known Allergies ? ?Family History  ?Problem Relation Age  of Onset  ? Colon cancer Neg Hx   ? ? ?Prior to Admission medications   ?Medication Sig Start Date End Date Taking? Authorizing Provider  ?albuterol (PROVENTIL) (2.5 MG/3ML) 0.083% nebulizer solution USE CONTENTS IN NEBULIZER UP TO THREE TIMES DAILY AS NEEDED 01/03/20   [provider]  ?albuterol (VENTOLIN HFA) 108 (90 Base) MCG/ACT inhaler SMARTSIG:1 Puff(s) By Mouth Every 6 Hours PRN 12/28/19   [provider]  ?amLODipine (NORVASC) 10 MG tablet Take 1 tablet (10 mg total) by mouth daily for 3 days. 03/03/21 03/06/21  Sidney Ace, MD  ?cetirizine (ZYRTEC) 10 MG tablet Take 4 tablets (40 mg total) by mouth daily for 5 days. 06/10/21 06/15/21  Cuthriell, Charline Bills, PA-C  ?diclofenac (FLECTOR) 1.3 % PTCH SMARTSIG:1 Patch(s) T-DERMAL Twice Daily PRN 01/29/21   [provider]  ?dicyclomine (BENTYL) 10 MG capsule Take 1 capsule (10 mg total) by mouth 4 (four) times daily for 7 days. 06/10/21 06/17/21  Cuthriell, Charline Bills, PA-C  ?famotidine (PEPCID) 20 MG tablet Take 1 tablet (20 mg total) by mouth daily. 06/10/21 06/10/22  Cuthriell, Charline Bills, PA-C  ?gabapentin (NEURONTIN) 300 MG capsule Take 300 mg by mouth at bedtime. 01/10/20   [provider]  ?hydrOXYzine (VISTARIL) 25 MG capsule Take 25 mg by mouth at bedtime.    [provider]  ?ondansetron (ZOFRAN ODT) 4 MG disintegrating tablet Take 1 tablet (4 mg total) by mouth every 8 (eight) hours as needed. 03/02/21   Sidney Ace, MD  ?phentermine (ADIPEX-P) 37.5 MG tablet phentermine 37.5 mg tablet ?  TAKE 1 TABLET BY MOUTH DAILY IN AM, NO CAFFEINE    [provider]  ?predniSONE (DELTASONE) 50 MG tablet Take 1 tablet (50 mg total) by mouth daily with breakfast. 06/29/21   Lavonia Drafts, MD  ?[facility] 160-4.5 MCG/ACT inhaler SMARTSIG:2 Puff(s) By Mouth Twice Daily 01/07/20   [provider]  ?Vitamin D, Ergocalciferol, (DRISDOL) 1.25 MG (50000 UNIT) CAPS capsule Take 50,000 Units by mouth once a week.  12/21/19   [provider]  ? ? ?Physical Exam: ?Vitals:  ? 01/29/22 1559 01/29/22 1900 01/29/22 1918 01/29/22 2015  ?BP:  (!) 157/120 (!) 181/108 (!) 189/112  ?Pulse:  (!) 114 92 (!) 107  ?Resp:   19   ?Temp:   98.7 ?F (37.1 ?C) 98.7 ?F (37.1 ?C)  ?TempSrc:   Oral Oral  ?SpO2:  100% 100% 100%  ?Weight: 55.3 kg     ?Height: 5' (1.524 m)     ? ?Physical Exam ?Vitals and nursing note reviewed.  ?Constitutional:   ?   General: She is not in acute distress. ?   Appearance: She is not ill-appearing, toxic-appearing or diaphoretic.  ?HENT:  ?   Head: Normocephalic and atraumatic.  ?   Right Ear: Hearing and external ear normal.  ?   Left Ear: Hearing and external ear normal.  ?   Nose: Nose normal. No nasal deformity.  ?   Mouth/Throat:  ?   Lips: Pink.  ?   Mouth: Mucous membranes are moist.  ?   Tongue: No lesions.  ?   Pharynx: Oropharynx is clear.  ?Eyes:  ?   Extraocular Movements: Extraocular movements intact.  ?   Pupils: Pupils are equal, round, and reactive to light.  ?Neck:  ?   Vascular: No carotid bruit.  ?Cardiovascular:  ?   Rate and Rhythm: Normal rate and regular rhythm.  ?   Pulses: Normal pulses.  ?   Heart sounds: Normal heart sounds.  ?Pulmonary:  ?   Effort: Pulmonary effort is normal.  ?   Breath sounds: Normal breath sounds.  ?Abdominal:  ?   General: Bowel sounds are normal. There is no distension.  ?   Palpations: Abdomen is soft. There is no mass.  ?   Tenderness: There is abdominal tenderness in the epigastric area. There is guarding.  ?   Hernia: No hernia is present.  ?Musculoskeletal:  ?   Right lower leg: No edema.  ?   Left lower leg: No edema.  ?Skin: ?   General: Skin is warm.  ?Neurological:  ?   General: No focal deficit present.  ?   Mental Status: She is alert and oriented to person, place, and time.  ?   Cranial Nerves: Cranial nerves 2-12 are intact.  ?   Motor: Motor function is intact.  ?Psychiatric:     ?   Attention and Perception: Attention normal.     ?   Mood and  Affect: Mood normal.     ?   Speech: Speech normal.     ?   Behavior: Behavior normal. Behavior is cooperative.     ?   Cognition and Memory: Cognition normal.  ? ? ?Data Reviewed: ?Results for orders placed or performed during the hospital encounter of 01/29/22 (from the past 24 hour(s))  ?Lipase, blood     Status: Abnormal  ? Collection Time: 01/29/22  3:52 PM  ?Result Value Ref Range  ? Lipase 785 (H) 11 - 51 U/L  ?Comprehensive  metabolic panel     Status: Abnormal  ? Collection Time: 01/29/22  3:52 PM  ?Result Value Ref Range  ? Sodium 131 (L) 135 - 145 mmol/L  ? Potassium 3.6 3.5 - 5.1 mmol/L  ? Chloride 97 (L) 98 - 111 mmol/L  ? CO2 22 22 - 32 mmol/L  ? Glucose, Bld 190 (H) 70 - 99 mg/dL  ? BUN 13 6 - 20 mg/dL  ? Creatinine, Ser 0.67 0.44 - 1.00 mg/dL  ? Calcium 9.3 8.9 - 10.3 mg/dL  ? Total Protein 7.8 6.5 - 8.1 g/dL  ? Albumin 4.4 3.5 - 5.0 g/dL  ? AST 27 15 - 41 U/L  ? ALT 20 0 - 44 U/L  ? Alkaline Phosphatase 87 38 - 126 U/L  ? Total Bilirubin 0.8 0.3 - 1.2 mg/dL  ? GFR, Estimated >60 >60 mL/min  ? Anion gap 12 5 - 15  ?CBC     Status: Abnormal  ? Collection Time: 01/29/22  3:52 PM  ?Result Value Ref Range  ? WBC 13.1 (H) 4.0 - 10.5 K/uL  ? RBC 4.42 3.87 - 5.11 MIL/uL  ? Hemoglobin 14.5 12.0 - 15.0 g/dL  ? HCT 42.9 36.0 - 46.0 %  ? MCV 97.1 80.0 - 100.0 fL  ? MCH 32.8 26.0 - 34.0 pg  ? MCHC 33.8 30.0 - 36.0 g/dL  ? RDW 12.1 11.5 - 15.5 %  ? Platelets 337 150 - 400 K/uL  ? nRBC 0.0 0.0 - 0.2 %  ?Hemoglobin A1c     Status: None  ? Collection Time: 01/29/22  3:52 PM  ?Result Value Ref Range  ? Hgb A1c MFr Bld 5.2 4.8 - 5.6 %  ? Mean Plasma Glucose 102.54 mg/dL  ?Urinalysis, Routine w reflex microscopic Urine, Clean Catch     Status: Abnormal  ? Collection Time: 01/29/22  6:57 PM  ?Result Value Ref Range  ? Color, Urine YELLOW (A) YELLOW  ? APPearance HAZY (A) CLEAR  ? Specific Gravity, Urine 1.019 1.005 - 1.030  ? pH 5.0 5.0 - 8.0  ? Glucose, UA NEGATIVE NEGATIVE mg/dL  ? Hgb urine dipstick SMALL (A) NEGATIVE   ? Bilirubin Urine NEGATIVE NEGATIVE  ? Ketones, ur 5 (A) NEGATIVE mg/dL  ? Protein, ur 100 (A) NEGATIVE mg/dL  ? Nitrite NEGATIVE NEGATIVE  ? Leukocytes,Ua NEGATIVE NEGATIVE  ? RBC / HPF 0-5 0 - 5 RBC/hp

## 2022-01-30 ENCOUNTER — Inpatient Hospital Stay: Payer: 59

## 2022-01-30 ENCOUNTER — Encounter: Payer: Self-pay | Admitting: Internal Medicine

## 2022-01-30 DIAGNOSIS — Z7952 Long term (current) use of systemic steroids: Secondary | ICD-10-CM | POA: Diagnosis not present

## 2022-01-30 DIAGNOSIS — R109 Unspecified abdominal pain: Secondary | ICD-10-CM | POA: Diagnosis not present

## 2022-01-30 DIAGNOSIS — Z87891 Personal history of nicotine dependence: Secondary | ICD-10-CM | POA: Diagnosis not present

## 2022-01-30 DIAGNOSIS — Z7951 Long term (current) use of inhaled steroids: Secondary | ICD-10-CM | POA: Diagnosis not present

## 2022-01-30 DIAGNOSIS — I10 Essential (primary) hypertension: Secondary | ICD-10-CM | POA: Diagnosis present

## 2022-01-30 DIAGNOSIS — J4520 Mild intermittent asthma, uncomplicated: Secondary | ICD-10-CM | POA: Diagnosis present

## 2022-01-30 DIAGNOSIS — E871 Hypo-osmolality and hyponatremia: Secondary | ICD-10-CM | POA: Diagnosis present

## 2022-01-30 DIAGNOSIS — E876 Hypokalemia: Secondary | ICD-10-CM | POA: Diagnosis not present

## 2022-01-30 DIAGNOSIS — K859 Acute pancreatitis without necrosis or infection, unspecified: Secondary | ICD-10-CM | POA: Diagnosis present

## 2022-01-30 DIAGNOSIS — R1030 Lower abdominal pain, unspecified: Secondary | ICD-10-CM | POA: Diagnosis not present

## 2022-01-30 DIAGNOSIS — Z79899 Other long term (current) drug therapy: Secondary | ICD-10-CM | POA: Diagnosis not present

## 2022-01-30 LAB — MAGNESIUM: Magnesium: 2.2 mg/dL (ref 1.7–2.4)

## 2022-01-30 LAB — COMPREHENSIVE METABOLIC PANEL
ALT: 19 U/L (ref 0–44)
AST: 19 U/L (ref 15–41)
Albumin: 4.3 g/dL (ref 3.5–5.0)
Alkaline Phosphatase: 78 U/L (ref 38–126)
Anion gap: 12 (ref 5–15)
BUN: 9 mg/dL (ref 6–20)
CO2: 23 mmol/L (ref 22–32)
Calcium: 9.5 mg/dL (ref 8.9–10.3)
Chloride: 98 mmol/L (ref 98–111)
Creatinine, Ser: 0.46 mg/dL (ref 0.44–1.00)
GFR, Estimated: >60 mL/min (ref >60)
Glucose, Bld: 112 mg/dL — ABNORMAL HIGH (ref 70–99)
Potassium: 3.6 mmol/L (ref 3.5–5.1)
Sodium: 133 mmol/L — ABNORMAL LOW (ref 135–145)
Total Bilirubin: 0.7 mg/dL (ref 0.3–1.2)
Total Protein: 8 g/dL (ref 6.5–8.1)

## 2022-01-30 LAB — GLUCOSE, CAPILLARY: Glucose-Capillary: 132 mg/dL — ABNORMAL HIGH (ref 70–99)

## 2022-01-30 LAB — LIPASE, BLOOD: Lipase: 129 U/L — ABNORMAL HIGH (ref 11–51)

## 2022-01-30 LAB — PHOSPHORUS: Phosphorus: 2 mg/dL — ABNORMAL LOW (ref 2.5–4.6)

## 2022-01-30 MED ORDER — AMLODIPINE BESYLATE 5 MG PO TABS
5.0000 mg | ORAL_TABLET | Freq: Every day | ORAL | Status: DC
  Administered 2022-01-30 – 2022-02-01 (×3): 5 mg via ORAL
  Filled 2022-01-30 (×3): qty 1

## 2022-01-30 MED ORDER — IOHEXOL 9 MG/ML PO SOLN
500.0000 mL | ORAL | Status: AC
  Administered 2022-01-30 (×2): 500 mL via ORAL

## 2022-01-30 MED ORDER — POTASSIUM PHOSPHATES 15 MMOLE/5ML IV SOLN
20.0000 mmol | Freq: Once | INTRAVENOUS | Status: AC
  Administered 2022-01-30: 20 mmol via INTRAVENOUS
  Filled 2022-01-30: qty 6.67

## 2022-01-30 MED ORDER — IOHEXOL 300 MG/ML  SOLN
100.0000 mL | Freq: Once | INTRAMUSCULAR | Status: AC | PRN
  Administered 2022-01-30: 100 mL via INTRAVENOUS

## 2022-01-30 MED ORDER — OXYCODONE HCL 5 MG PO TABS
5.0000 mg | ORAL_TABLET | Freq: Four times a day (QID) | ORAL | Status: DC | PRN
Start: 2022-01-30 — End: 2022-02-01
  Administered 2022-01-30 – 2022-02-01 (×7): 5 mg via ORAL
  Filled 2022-01-30 (×8): qty 1

## 2022-01-30 NOTE — TOC Initial Note (Signed)
Transition of Care (TOC) - Initial/Assessment Note  ? ? ?Patient Details  ?Name: Danielle Harrison ?MRN: [id number] ?Date of Birth: Dec 08, 1975 ? ?Transition of Care (TOC) CM/SW Contact:    ?Marlowe Sax, RN ?Phone Number: ?01/30/2022, 9:23 AM ? ?Clinical Narrative:                 ? ?Patient is married,  ?Goes to [facility] for PCP ?Gets medication at [facility] ? ?Transition of Care (TOC) Screening Note ? ? ?Patient Details  ?Name: Danielle Harrison ?Date of Birth: 05-26-1976 ? ? ?Transition of Care (TOC) CM/SW Contact:    ?Marlowe Sax, RN ?Phone Number: ?01/30/2022, 9:23 AM ? ? ? ?Transition of Care Department [facility]) has reviewed patient and no TOC needs have been identified at this time. We will continue to monitor patient advancement through interdisciplinary progression rounds. If new patient transition needs arise, please place a TOC consult. ?  ?  ?  ? ? ?Patient Goals and CMS Choice ?  ?  ?  ? ?Expected Discharge Plan and Services ?  ?  ?  ?  ?  ?                ?  ?  ?  ?  ?  ?  ?  ?  ?  ?  ? ?Prior Living Arrangements/Services ?  ?  ?  ?       ?  ?  ?  ?  ? ?Activities of Daily Living ?Home Assistive Devices/Equipment: None ?ADL Screening (condition at time of admission) ?Patient's cognitive ability adequate to safely complete daily activities?: Yes ?Is the patient deaf or have difficulty hearing?: No ?Does the patient have difficulty seeing, even when wearing glasses/contacts?: No ?Does the patient have difficulty concentrating, remembering, or making decisions?: No ?Patient able to express need for assistance with ADLs?: Yes ?Does the patient have difficulty dressing or bathing?: No ?Independently performs ADLs?: Yes (appropriate for developmental age) ?Does the patient have difficulty walking or climbing stairs?: No ?Weakness of Legs: None ?Weakness of Arms/Hands: None ? ?Permission Sought/Granted ?  ?  ?   ?   ?   ?   ? ?Emotional Assessment ?  ?  ?  ?  ?  ?  ? ?Admission diagnosis:  Abdominal  pain [R10.9] ?Patient Active Problem List  ? Diagnosis Date Noted  ? Abdominal pain 01/29/2022  ? Acute pancreatitis 02/27/2021  ? Chronic pain 02/27/2021  ? Asthma 02/27/2021  ? Stomach ache   ? Diarrhea   ? Stomach irritation   ? ?PCP:  [facility] ?Pharmacy:   ?[id number] [place], [place] - [address] ?[address] ?[place] [address] ?Phone: [phone] Fax: [phone] ? ? ? ? ?Social Determinants of Health (SDOH) Interventions ?  ? ?Readmission Risk Interventions ?   ? View : No data to display.  ?  ?  ?  ? ? ? ?

## 2022-01-30 NOTE — Progress Notes (Signed)
?PROGRESS NOTE ? ? ? ?Danielle Harrison  VPX:[id number] DOB: 04-04-1976 DOA: 01/29/2022 ?PCP: [facility]  ? ? ?Brief Narrative:  ?46 year old with history of anxiety, restless leg syndrome, chronic epigastric pain and untreated hypertension presented with about 2 days of severe unrelenting epigastric pain with nausea.  Previous admissions with pancreatitis cause unknown.  LFTs normal.  Electrolytes are normal.  Lipase was 700. ? ? ?Assessment & Plan: ?  ?Acute uncomplicated pancreatitis, abdominal pain persistent. ?N.p.o. with ice chips and sips, adequate IV and oral opiates for pain relief.  Continue maintenance IV fluid. ?Repeat electrolytes, LFTs and lipase now. ?IV PPI to continue. ?Previous CT scans with no evidence of gallbladder disease, repeat CT scan today abdomen and pelvis to know the extent of the pancreatitis. ?Drinks wine in moderation about 3 times per week, GGT is normal.  LFTs are normal. ?Previous autoimmune work-up including IgG was normal. ?Triglyceride is normal. ?Symptomatic treatment, will refer to GI on discharge. ? ?Mild intermittent asthma: Well-controlled.  On albuterol as needed. ? ?Essential hypertension: Untreated.  Greatly elevated today.  Previously on amlodipine.  Restart amlodipine and will gradually go up on the doses.  Will need prescription on discharge. ? ? ?DVT prophylaxis: heparin injection 5,000 Units Start: 01/29/22 2200 ?SCDs Start: 01/29/22 1826 ? ? ?Code Status: Full code ?Family Communication: None ?Disposition Plan: Status is: Observation ?The patient will require care spanning > 2 midnights and should be moved to inpatient because: Persistent abdominal pain.  Unable to eat. ?  ? ? ?Consultants:  ?None ? ?Procedures:  ?None ? ?Antimicrobials:  ?None ? ? ?Subjective: ?Patient seen and examined.  Continues to have pain, intermittent after wearing of morphine.  She wants to try some oral oxycodone.  Denies any nausea but she has not had anything taken by  mouth. ?Patient tells me that she had not been on blood pressure medications recently and does not remember when she was on it.  Multiple previous ER visits with abdominal pain, 1 inpatient admission. ? ?Objective: ?Vitals:  ? 01/30/22 0005 01/30/22 0434 01/30/22 0816 01/30/22 1106  ?BP: (!) 171/114 (!) 176/103 (!) 167/112 (!) 186/100  ?Pulse: 100 98 (!) 102 92  ?Resp:  18 18 17   ?Temp:  98.3 ?F (36.8 ?C) 99.3 ?F (37.4 ?C) 97.9 ?F (36.6 ?C)  ?TempSrc:  Oral Oral Oral  ?SpO2:  100% 100% 100%  ?Weight:      ?Height:      ? ? ?Intake/Output Summary (Last 24 hours) at 01/30/2022 1122 ?Last data filed at 01/30/2022 0300 ?Gross per 24 hour  ?Intake 642.62 ml  ?Output --  ?Net 642.62 ml  ? ?Filed Weights  ? 01/29/22 1559  ?Weight: 55.3 kg  ? ? ?Examination: ? ?General exam: Appears calm and comfortable.  Not in any distress. ?Respiratory system: Clear to auscultation. Respiratory effort normal.  No added sounds. ?Cardiovascular system: S1 & S2 heard, RRR.  ?Gastrointestinal system: Soft.  Mildly distended but mildly tender.  No mass palpable.  No rigidity or guarding.  Mild tenderness on deep palpation of the epigastrium. ?Central nervous system: Alert and oriented. No focal neurological deficits. ?Extremities: Symmetric 5 x 5 power. ?Skin: No rashes, lesions or ulcers ?Psychiatry: Judgement and insight appear normal. Mood & affect appropriate.  ? ? ? ?Data Reviewed: I have personally reviewed following labs and imaging studies ? ?CBC: ?Recent Labs  ?Lab 01/29/22 ?1552  ?WBC 13.1*  ?HGB 14.5  ?HCT 42.9  ?MCV 97.1  ?PLT 337  ? ?Basic  Metabolic Panel: ?Recent Labs  ?Lab 01/29/22 ?1552  ?NA 131*  ?K 3.6  ?CL 97*  ?CO2 22  ?GLUCOSE 190*  ?BUN 13  ?CREATININE 0.67  ?CALCIUM 9.3  ? ?GFR: ?Estimated Creatinine Clearance: 69.3 mL/min (by C-G formula based on SCr of 0.67 mg/dL). ?Liver Function Tests: ?Recent Labs  ?Lab 01/29/22 ?1552  ?AST 27  ?ALT 20  ?ALKPHOS 87  ?BILITOT 0.8  ?PROT 7.8  ?ALBUMIN 4.4  ? ?Recent Labs  ?Lab  01/29/22 ?1552  ?LIPASE 785*  ? ?No results for input(s): AMMONIA in the last 168 hours. ?Coagulation Profile: ?No results for input(s): INR, PROTIME in the last 168 hours. ?Cardiac Enzymes: ?No results for input(s): CKTOTAL, CKMB, CKMBINDEX, TROPONINI in the last 168 hours. ?BNP (last 3 results) ?No results for input(s): PROBNP in the last 8760 hours. ?HbA1C: ?Recent Labs  ?  01/29/22 ?1552  ?HGBA1C 5.2  ? ?CBG: ?Recent Labs  ?Lab 01/29/22 ?2047  ?GLUCAP 132*  ? ?Lipid Profile: ?Recent Labs  ?  01/29/22 ?1857  ?CHOL 240*  ?HDL 101  ?LDLCALC 98  ?TRIG 207*  ?CHOLHDL 2.4  ? ?Thyroid Function Tests: ?Recent Labs  ?  01/29/22 ?1857  ?TSH 1.476  ?FREET4 0.68  ? ?Anemia Panel: ?No results for input(s): VITAMINB12, FOLATE, FERRITIN, TIBC, IRON, RETICCTPCT in the last 72 hours. ?Sepsis Labs: ?No results for input(s): PROCALCITON, LATICACIDVEN in the last 168 hours. ? ?No results found for this or any previous visit (from the past 240 hour(s)).  ? ? ? ? ? ?Radiology Studies: ?No results found. ? ? ? ? ? ?Scheduled Meds: ? amLODipine  5 mg Oral Daily  ? heparin  5,000 Units Subcutaneous Q8H  ? iohexol  500 mL Oral Q1H  ? pantoprazole (PROTONIX) IV  40 mg Intravenous Q12H  ? ?Continuous Infusions: ? lactated ringers 100 mL/hr at 01/30/22 0609  ? ? ? LOS: 0 days  ? ? ?Time spent: 35 minutes ? ? ? ?Dorcas Carrow, MD ?[facility] ?Pager [phone] ? ?

## 2022-01-31 DIAGNOSIS — E876 Hypokalemia: Secondary | ICD-10-CM

## 2022-01-31 DIAGNOSIS — J4520 Mild intermittent asthma, uncomplicated: Secondary | ICD-10-CM

## 2022-01-31 DIAGNOSIS — I10 Essential (primary) hypertension: Secondary | ICD-10-CM

## 2022-01-31 DIAGNOSIS — R1030 Lower abdominal pain, unspecified: Secondary | ICD-10-CM | POA: Diagnosis not present

## 2022-01-31 DIAGNOSIS — K859 Acute pancreatitis without necrosis or infection, unspecified: Secondary | ICD-10-CM | POA: Diagnosis not present

## 2022-01-31 DIAGNOSIS — R109 Unspecified abdominal pain: Secondary | ICD-10-CM

## 2022-01-31 LAB — BASIC METABOLIC PANEL
Anion gap: 6 (ref 5–15)
BUN: 7 mg/dL (ref 6–20)
CO2: 29 mmol/L (ref 22–32)
Calcium: 9.2 mg/dL (ref 8.9–10.3)
Chloride: 100 mmol/L (ref 98–111)
Creatinine, Ser: 0.5 mg/dL (ref 0.44–1.00)
GFR, Estimated: >60 mL/min (ref >60)
Glucose, Bld: 127 mg/dL — ABNORMAL HIGH (ref 70–99)
Potassium: 3.3 mmol/L — ABNORMAL LOW (ref 3.5–5.1)
Sodium: 135 mmol/L (ref 135–145)

## 2022-01-31 LAB — LIPASE, BLOOD: Lipase: 48 U/L (ref 11–51)

## 2022-01-31 MED ORDER — POTASSIUM CHLORIDE CRYS ER 20 MEQ PO TBCR
20.0000 meq | EXTENDED_RELEASE_TABLET | Freq: Two times a day (BID) | ORAL | Status: DC
  Administered 2022-01-31 – 2022-02-01 (×3): 20 meq via ORAL
  Filled 2022-01-31 (×3): qty 1

## 2022-01-31 NOTE — Progress Notes (Signed)
?PROGRESS NOTE ? ? ? ?Danielle Harrison  GUY:[id number] DOB: Jun 01, 1976 DOA: 01/29/2022 ?PCP: [facility]  ? ? ?Brief Narrative:  ?46 year old with history of anxiety, restless leg syndrome, chronic epigastric pain and untreated hypertension presented with about 2 days of severe unrelenting epigastric pain with nausea.  Previous admissions with pancreatitis cause unknown.  LFTs normal.  Electrolytes are normal.  Lipase was 700. ? ? ?Assessment & Plan: ?  ?Acute uncomplicated pancreatitis, abdominal pain. ?Treated with aggressive IV fluids and IV and oral opiates clinical improvement today. ?Challenge with clears.  Manage pain with oral pain medications.  Continue maintenance IV fluid today. ?Electrolytes are stable.  LFTs are normal.  Lipase has normalized.  Continue IV PPI. ?CT scan abdomen pelvis with no evidence of gallbladder disease, pancreatitis. ?Drinks wine in moderation about 3 times per week, GGT is normal.  LFTs are normal. ?Previous autoimmune work-up including IgG was normal. ?Triglyceride is normal. ?Symptomatic treatment, will refer to GI on discharge. ?Complete abstinence from alcohol advised. ? ?Mild intermittent asthma: Well-controlled.  On albuterol as needed. ? ?Essential hypertension: Untreated. Restart amlodipine and will gradually go up on the doses.  Will need prescription on discharge. ? ?Hypokalemia: We will replace. ? ? ?DVT prophylaxis: heparin injection 5,000 Units Start: 01/29/22 2200 ?SCDs Start: 01/29/22 1826 ? ? ?Code Status: Full code ?Family Communication: None ?Disposition Plan: Status is: Inpatient.  Severe significant pain.  Challenging with clears today. ? ? ?Consultants:  ?None ? ?Procedures:  ?None ? ?Antimicrobials:  ?None ? ? ?Subjective: ? ?Patient seen and examined.  Denies any nausea vomiting.  She has been chewing on some ice chips otherwise she has not taken any.  Pain persist but slightly better than yesterday.  No BM but passing flatus.  Abdominal distention is  lesser today. ? ?Objective: ?Vitals:  ? 01/30/22 1141 01/30/22 2013 01/31/22 0500 01/31/22 0828  ?BP: (!) 173/104 (!) 149/103 (!) 138/102 (!) 136/97  ?Pulse:  (!) 110 (!) 102 (!) 104  ?Resp:  16 16 16   ?Temp:  99.6 ?F (37.6 ?C) 98.4 ?F (36.9 ?C) 98.2 ?F (36.8 ?C)  ?TempSrc:    Oral  ?SpO2:  100% 100% 100%  ?Weight:      ?Height:      ? ? ?Intake/Output Summary (Last 24 hours) at 01/31/2022 1339 ?Last data filed at 01/30/2022 1559 ?Gross per 24 hour  ?Intake 983.06 ml  ?Output --  ?Net 983.06 ml  ? ?Filed Weights  ? 01/29/22 1559  ?Weight: 55.3 kg  ? ? ?Examination: ? ?General exam: Appears calm and comfortable.  Not in any distress at rest. ?Respiratory system: Clear to auscultation. Respiratory effort normal.  No added sounds. ?Cardiovascular system: S1 & S2 heard, RRR.  ?Gastrointestinal system: Soft.  Not tender.  Bowel sound present.   ?Central nervous system: Alert and oriented. No focal neurological deficits. ?Extremities: Symmetric 5 x 5 power. ?Skin: No rashes, lesions or ulcers ?Psychiatry: Judgement and insight appear normal. Mood & affect appropriate.  ? ? ? ?Data Reviewed: I have personally reviewed following labs and imaging studies ? ?CBC: ?Recent Labs  ?Lab 01/29/22 ?1552  ?WBC 13.1*  ?HGB 14.5  ?HCT 42.9  ?MCV 97.1  ?PLT 337  ? ?Basic Metabolic Panel: ?Recent Labs  ?Lab 01/29/22 ?1552 01/30/22 ?1156 01/31/22 ?02/02/22  ?NA 131* 133* 135  ?K 3.6 3.6 3.3*  ?CL 97* 98 100  ?CO2 22 23 29   ?GLUCOSE 190* 112* 127*  ?BUN 13 9 7   ?CREATININE 0.67 0.46 0.50  ?  CALCIUM 9.3 9.5 9.2  ?MG  --  2.2  --   ?PHOS  --  2.0*  --   ? ?GFR: ?Estimated Creatinine Clearance: 69.3 mL/min (by C-G formula based on SCr of 0.5 mg/dL). ?Liver Function Tests: ?Recent Labs  ?Lab 01/29/22 ?1552 01/30/22 ?1156  ?AST 27 19  ?ALT 20 19  ?ALKPHOS 87 78  ?BILITOT 0.8 0.7  ?PROT 7.8 8.0  ?ALBUMIN 4.4 4.3  ? ?Recent Labs  ?Lab 01/29/22 ?1552 01/30/22 ?1156 01/31/22 ?9518  ?LIPASE 785* 129* 48  ? ?No results for input(s): AMMONIA in the last 168  hours. ?Coagulation Profile: ?No results for input(s): INR, PROTIME in the last 168 hours. ?Cardiac Enzymes: ?No results for input(s): CKTOTAL, CKMB, CKMBINDEX, TROPONINI in the last 168 hours. ?BNP (last 3 results) ?No results for input(s): PROBNP in the last 8760 hours. ?HbA1C: ?Recent Labs  ?  01/29/22 ?1552  ?HGBA1C 5.2  ? ?CBG: ?Recent Labs  ?Lab 01/29/22 ?2047  ?GLUCAP 132*  ? ?Lipid Profile: ?Recent Labs  ?  01/29/22 ?1857  ?CHOL 240*  ?HDL 101  ?LDLCALC 98  ?TRIG 207*  ?CHOLHDL 2.4  ? ?Thyroid Function Tests: ?Recent Labs  ?  01/29/22 ?1857  ?TSH 1.476  ?FREET4 0.68  ? ?Anemia Panel: ?No results for input(s): VITAMINB12, FOLATE, FERRITIN, TIBC, IRON, RETICCTPCT in the last 72 hours. ?Sepsis Labs: ?No results for input(s): PROCALCITON, LATICACIDVEN in the last 168 hours. ? ?No results found for this or any previous visit (from the past 240 hour(s)).  ? ? ? ? ? ?Radiology Studies: ?CT ABDOMEN PELVIS W CONTRAST ? ?Result Date: 01/30/2022 ?CLINICAL DATA:  Chronic epigastric pain, hypertension. Severe unrelenting epigastric pain for 2 days with nausea. EXAM: CT ABDOMEN AND PELVIS WITH CONTRAST TECHNIQUE: Multidetector CT imaging of the abdomen and pelvis was performed using the standard protocol following bolus administration of intravenous contrast. RADIATION DOSE REDUCTION: This exam was performed according to the departmental dose-optimization program which includes automated exposure control, adjustment of the mA and/or kV according to patient size and/or use of iterative reconstruction technique. CONTRAST:  OMNIPAQUE IOHEXOL 300 MG/ML  SOLN COMPARISON:  02/27/2021. FINDINGS: Lower chest: Lung bases are clear. Heart size normal. No pericardial or pleural effusion. Distal esophagus is unremarkable. Hepatobiliary: Liver and gallbladder are unremarkable. No biliary ductal dilatation. Pancreas: Pancreas is edematous with surrounding inflammatory haziness and stranding. No discrete fluid collection. No ductal  dilatation. Uniform attenuation within the gland. Spleen: Negative. Adrenals/Urinary Tract: Adrenal glands and kidneys are unremarkable. Ureters are decompressed. Bladder is grossly unremarkable. Stomach/Bowel: Stomach, small bowel, appendix and colon are unremarkable. Vascular/Lymphatic: Atherosclerotic calcification of the aorta. Vascular structures are patent. No pathologically enlarged lymph nodes. Reproductive: Uterus is visualized. Fluid is seen in the endometrial and endocervical canal. Possible nabothian cysts. No adnexal mass. Other: Trace fluid or thickening in the cul-de-sac. Mesenteries and peritoneum are otherwise unremarkable. Musculoskeletal: Degenerative changes in the spine. 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 Grade 1 anterolisthesis of L4 on L5. IMPRESSION: 1. Acute pancreatitis without complicating feature. 2. Fluid in the endometrial and endocervical canal. Please correlate clinically. 3. 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 4.  Aortic atherosclerosis (ICD10-I70.0). Electronically Signed   By: Leanna Battles M.D.   On: 01/30/2022 14:07   ? ? ? ? ? ?Scheduled Meds: ? amLODipine  5 mg Oral Daily  ? heparin  5,000 Units Subcutaneous Q8H  ? pantoprazole (PROTONIX) IV  40 mg Intravenous Q12H  ? potassium  chloride  20 mEq Oral BID  ? ?Continuous Infusions: ? ? ? ? LOS: 1 day  ? ? ?Time spent: 35 minutes ? ? ? ?Danielle CarrowKuber Ledon Weihe, MD ?[facility] ?Pager [phone] ? ?

## 2022-02-01 MED ORDER — OXYCODONE HCL 5 MG PO TABS: 5.0000 mg | ORAL_TABLET | Freq: Four times a day (QID) | ORAL | 0 refills | Status: AC | PRN

## 2022-02-01 MED ORDER — AMLODIPINE BESYLATE 10 MG PO TABS
10.0000 mg | ORAL_TABLET | Freq: Every day | ORAL | 0 refills | Status: DC
Start: 2022-02-01 — End: 2023-11-04

## 2022-02-01 MED ORDER — ONDANSETRON HCL 4 MG PO TABS: 4.0000 mg | ORAL_TABLET | Freq: Four times a day (QID) | ORAL | 0 refills | Status: DC | PRN

## 2022-02-01 NOTE — Progress Notes (Signed)
Vitals entered manually ° °

## 2022-02-01 NOTE — Discharge Summary (Signed)
Physician Discharge Summary  ?STEFAN KAREN FTD:[id number] DOB: Oct 24, 1975 DOA: 01/29/2022 ? ?PCP: [facility] ? ?Admit date: 01/29/2022 ?Discharge date: 02/01/2022 ? ?Admitted From: Home ?Disposition: Home ? ?Recommendations for Outpatient Follow-up:  ?Follow up with PCP in 1-2 weeks ?Please obtain BMP/CBC in one week ?We will send outpatient referral to GI for follow-up. ?Complete abstinence from alcohol. ? ?Home Health: N/A ?Equipment/Devices: N/A ? ?Discharge Condition: Stable ?CODE STATUS: Full code ?Diet recommendation: Low-salt diet, liquid and soft diet. ? ?Discharge summary: ?46 year old with history of anxiety, untreated hypertension presented with 2 days of severe unrelenting epigastric pain and nausea.  Does have a history of admissions with pancreatitis in the past.  LFTs were normal.  Electrolytes were normal.  Lipase was 700.  CT scan abdomen pelvis with no biliary disease but significant acute pancreatitis.  She was admitted to the hospital treated with aggressive IV fluids, IV and oral opiates with good clinical response.  Lipase normalized.  Able to tolerate full liquid diet and pain managed with occasional use of oral opiates today.  Electrolytes are stable. ? ?LFTs normal.  Repeated scans with no evidence of biliary disease.  IgG was normal.  Triglycerides are normal.  She does drink about 3 days in a week.  Unsure whether this is alcoholic pancreatitis, however complete abstinence from alcohol will be needed as we do not have another explanation.  Patient is agreeable.  With good clinical response, she will be going home today.  Will prescribe short course of pain medications.  She will stay on full liquid and soft diet for next few days.  We will send a referral to gastroenterology for ongoing follow-up if any needed. ? ?Patient does have a history of untreated hypertension, she is agreeable to go back on amlodipine.  Will prescribe amlodipine on discharge today.  Advised follow-up with  PCP. ? ? ?Discharge Diagnoses:  ?Principal Problem: ?  Abdominal pain ?Active Problems: ?  Acute pancreatitis ?  Asthma ? ? ? ?Discharge Instructions ? ?Discharge Instructions   ? ? Ambulatory referral to Gastroenterology   Complete by: As directed ?  ? Call MD for:  persistant nausea and vomiting   Complete by: As directed ?  ? Call MD for:  severe uncontrolled pain   Complete by: As directed ?  ? Diet - low sodium heart healthy   Complete by: As directed ?  ? Discharge instructions   Complete by: As directed ?  ? Stay on liquid and soft diet until all pain is improved ?Complete abstinence from alcohol  ? Increase activity slowly   Complete by: As directed ?  ? ?  ? ?Allergies as of 02/01/2022   ?No Known Allergies ?  ? ?  ?Medication List  ?  ? ?STOP taking these medications   ? ?diclofenac 1.3 % Ptch ?Commonly known as: FLECTOR ?  ?dicyclomine 10 MG capsule ?Commonly known as: Bentyl ?  ?gabapentin 300 MG capsule ?Commonly known as: NEURONTIN ?  ?hydrOXYzine 25 MG capsule ?Commonly known as: VISTARIL ?  ?ondansetron 4 MG disintegrating tablet ?Commonly known as: Zofran ODT ?  ?phentermine 37.5 MG tablet ?Commonly known as: ADIPEX-P ?  ?predniSONE 50 MG tablet ?Commonly known as: DELTASONE ?  ?Symbicort 160-4.5 MCG/ACT inhaler ?Generic drug: budesonide-formoterol ?  ?Vitamin D (Ergocalciferol) 1.25 MG (50000 UNIT) Caps capsule ?Commonly known as: DRISDOL ?  ? ?  ? ?TAKE these medications   ? ?albuterol 108 (90 Base) MCG/ACT inhaler ?Commonly known as: VENTOLIN HFA ?SMARTSIG:1  Puff(s) By Mouth Every 6 Hours PRN ?What changed: Another medication with the same name was removed. Continue taking this medication, and follow the directions you see here. ?  ?amLODipine 10 MG tablet ?Commonly known as: NORVASC ?Take 1 tablet (10 mg total) by mouth daily for 3 days. ?  ?cetirizine 10 MG tablet ?Commonly known as: ZYRTEC ?Take 4 tablets (40 mg total) by mouth daily for 5 days. ?  ?famotidine 20 MG tablet ?Commonly known as:  PEPCID ?Take 1 tablet (20 mg total) by mouth daily. ?  ?ondansetron 4 MG tablet ?Commonly known as: ZOFRAN ?Take 1 tablet (4 mg total) by mouth every 6 (six) hours as needed for nausea. ?  ?oxyCODONE 5 MG immediate release tablet ?Commonly known as: Oxy IR/ROXICODONE ?Take 1 tablet (5 mg total) by mouth every 6 (six) hours as needed for up to 3 days for moderate pain or breakthrough pain. ?  ? ?  ? ? ?No Known Allergies ? ?Consultations: ?None. ? ? ?Procedures/Studies: ?CT ABDOMEN PELVIS W CONTRAST ? ?Result Date: 01/30/2022 ?CLINICAL DATA:  Chronic epigastric pain, hypertension. Severe unrelenting epigastric pain for 2 days with nausea. EXAM: CT ABDOMEN AND PELVIS WITH CONTRAST TECHNIQUE: Multidetector CT imaging of the abdomen and pelvis was performed using the standard protocol following bolus administration of intravenous contrast. RADIATION DOSE REDUCTION: This exam was performed according to the departmental dose-optimization program which includes automated exposure control, adjustment of the mA and/or kV according to patient size and/or use of iterative reconstruction technique. CONTRAST:  100mL OMNIPAQUE IOHEXOL 300 MG/ML  SOLN COMPARISON:  02/27/2021. FINDINGS: Lower chest: Lung bases are clear. Heart size normal. No pericardial or pleural effusion. Distal esophagus is unremarkable. Hepatobiliary: Liver and gallbladder are unremarkable. No biliary ductal dilatation. Pancreas: Pancreas is edematous with surrounding inflammatory haziness and stranding. No discrete fluid collection. No ductal dilatation. Uniform attenuation within the gland. Spleen: Negative. Adrenals/Urinary Tract: Adrenal glands and kidneys are unremarkable. Ureters are decompressed. Bladder is grossly unremarkable. Stomach/Bowel: Stomach, small bowel, appendix and colon are unremarkable. Vascular/Lymphatic: Atherosclerotic calcification of the aorta. Vascular structures are patent. No pathologically enlarged lymph nodes. Reproductive:  Uterus is visualized. Fluid is seen in the endometrial and endocervical canal. Possible nabothian cysts. No adnexal mass. Other: Trace fluid or thickening in the cul-de-sac. Mesenteries and peritoneum are otherwise unremarkable. Musculoskeletal: Degenerative changes in the spine. 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 Grade 1 anterolisthesis of L4 on L5. IMPRESSION: 1. Acute pancreatitis without complicating feature. 2. Fluid in the endometrial and endocervical canal. Please correlate clinically. 3. 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 4.  Aortic atherosclerosis (ICD10-I70.0). Electronically Signed   By: Leanna BattlesMelinda  Blietz M.D.   On: 01/30/2022 14:07   ?(Echo, Carotid, EGD, Colonoscopy, ERCP)  ? ? ?Subjective: Patient seen and examined.  Denies any nausea vomiting.  Tolerated liquid diet.  She used 3 doses of oxycodone for the last 12 hours and remained fairly stable.  No BM.  Passing flatus.  Eager to go home. ? ? ?Discharge Exam: ?Vitals:  ? 02/01/22 0520 02/01/22 0915  ?BP: (!) 145/104 (!) 155/95  ?Pulse: 100 95  ?Resp: 14 16  ?Temp: (!) 97.5 ?F (36.4 ?C) 97.8 ?F (36.6 ?C)  ?SpO2: 99% 100%  ? ?Vitals:  ? 01/31/22 2030 02/01/22 0002 02/01/22 0520 02/01/22 0915  ?BP: (!) 154/96 (!) 159/102 (!) 145/104 (!) 155/95  ?Pulse: 96 90 100 95  ?Resp: 17 17 14 16   ?Temp: 98.5 ?F (36.9 ?C) 98.1 ?  F (36.7 ?C) (!) 97.5 ?F (36.4 ?C) 97.8 ?F (36.6 ?C)  ?TempSrc:    Oral  ?SpO2: 100% 100% 99% 100%  ?Weight:      ?Height:      ? ? ?General: Pt is alert, awake, not in acute distress ?Cardiovascular: RRR, S1/S2 +, no rubs, no gallops ?Respiratory: CTA bilaterally, no wheezing, no rhonchi ?Abdominal: Soft, NT, ND, bowel sounds +, does not have any evidence of organomegaly.  Does not have any evidence of tenderness, rigidity or guarding. ?Extremities: no edema, no cyanosis ? ? ? ?The results of significant diagnostics from this hospitalization (including imaging, microbiology, ancillary and laboratory) are  listed below for reference.   ? ? ?Microbiology: ?No results found for this or any previous visit (from the past 240 hour(s)).  ? ?Labs: ?BNP (last 3 results) ?No results for input(s): BNP in the last 8760

## 2022-02-01 NOTE — Plan of Care (Signed)
Patient discharged home per MD orders at this time.All discharge instructions,education and medications reviewed with the patient.Pt expressed understanding and will comply with dc instructions.follow up appointments was also communicated to the patient.no verbal c/o or any ssx of distress.Pt was discharged home with self-care per order.Pt transported self home in her privately owned vehicle. ?

## 2022-06-02 ENCOUNTER — Emergency Department: Payer: 59

## 2022-06-02 ENCOUNTER — Other Ambulatory Visit: Payer: Self-pay

## 2022-06-02 ENCOUNTER — Emergency Department
Admission: EM | Admit: 2022-06-02 | Discharge: 2022-06-02 | Disposition: A | Discharge status: Home / Self Care | Payer: 59 | Attending: Emergency Medicine | Admitting: Emergency Medicine

## 2022-06-02 DIAGNOSIS — T7840XA Allergy, unspecified, initial encounter: Secondary | ICD-10-CM | POA: Insufficient documentation

## 2022-06-02 DIAGNOSIS — J45909 Unspecified asthma, uncomplicated: Secondary | ICD-10-CM | POA: Insufficient documentation

## 2022-06-02 DIAGNOSIS — Z20822 Contact with and (suspected) exposure to covid-19: Secondary | ICD-10-CM | POA: Diagnosis not present

## 2022-06-02 DIAGNOSIS — T782XXA Anaphylactic shock, unspecified, initial encounter: Secondary | ICD-10-CM

## 2022-06-02 DIAGNOSIS — R0602 Shortness of breath: Secondary | ICD-10-CM | POA: Diagnosis present

## 2022-06-02 LAB — CBC WITH DIFFERENTIAL/PLATELET
Abs Immature Granulocytes: 0.03 10*3/uL (ref 0.00–0.07)
Basophils Absolute: 0.1 10*3/uL (ref 0.0–0.1)
Basophils Relative: 1 %
Eosinophils Absolute: 0.4 10*3/uL (ref 0.0–0.5)
Eosinophils Relative: 5 %
HCT: 42.1 % (ref 36.0–46.0)
Hemoglobin: 13.8 g/dL (ref 12.0–15.0)
Immature Granulocytes: 0 %
Lymphocytes Relative: 43 %
Lymphs Abs: 4.2 10*3/uL — ABNORMAL HIGH (ref 0.7–4.0)
MCH: 31.3 pg (ref 26.0–34.0)
MCHC: 32.8 g/dL (ref 30.0–36.0)
MCV: 95.5 fL (ref 80.0–100.0)
Monocytes Absolute: 0.7 10*3/uL (ref 0.1–1.0)
Monocytes Relative: 7 %
Neutro Abs: 4.3 10*3/uL (ref 1.7–7.7)
Neutrophils Relative %: 44 %
Platelets: 337 10*3/uL (ref 150–400)
RBC: 4.41 MIL/uL (ref 3.87–5.11)
RDW: 12.5 % (ref 11.5–15.5)
WBC: 9.7 10*3/uL (ref 4.0–10.5)
nRBC: 0 % (ref 0.0–0.2)

## 2022-06-02 LAB — POC URINE PREG, ED: Preg Test, Ur: NEGATIVE

## 2022-06-02 LAB — BASIC METABOLIC PANEL
Anion gap: 14 (ref 5–15)
BUN: 15 mg/dL (ref 6–20)
CO2: 23 mmol/L (ref 22–32)
Calcium: 9.5 mg/dL (ref 8.9–10.3)
Chloride: 100 mmol/L (ref 98–111)
Creatinine, Ser: 0.69 mg/dL (ref 0.44–1.00)
GFR, Estimated: >60 mL/min (ref >60)
Glucose, Bld: 199 mg/dL — ABNORMAL HIGH (ref 70–99)
Potassium: 3.5 mmol/L (ref 3.5–5.1)
Sodium: 137 mmol/L (ref 135–145)

## 2022-06-02 LAB — RESP PANEL BY RT-PCR (FLU A&B, COVID) ARPGX2
Influenza A by PCR: NEGATIVE
Influenza B by PCR: NEGATIVE
SARS Coronavirus 2 by RT PCR: NEGATIVE

## 2022-06-02 MED ORDER — ALBUTEROL SULFATE (2.5 MG/3ML) 0.083% IN NEBU: 3.0000 mL | INHALATION_SOLUTION | RESPIRATORY_TRACT | Status: DC | PRN

## 2022-06-02 MED ORDER — IPRATROPIUM-ALBUTEROL 0.5-2.5 (3) MG/3ML IN SOLN
3.0000 mL | Freq: Once | RESPIRATORY_TRACT | Status: AC
Start: 2022-06-02 — End: 2022-06-02
  Administered 2022-06-02: 3 mL via RESPIRATORY_TRACT
  Filled 2022-06-02: qty 3

## 2022-06-02 MED ORDER — DIPHENHYDRAMINE HCL 50 MG/ML IJ SOLN
25.0000 mg | Freq: Once | INTRAMUSCULAR | Status: AC
  Administered 2022-06-02: 25 mg via INTRAVENOUS
  Filled 2022-06-02: qty 1

## 2022-06-02 MED ORDER — PREDNISONE 50 MG PO TABS: 50.0000 mg | ORAL_TABLET | Freq: Every day | ORAL | 0 refills | Status: AC

## 2022-06-02 NOTE — ED Notes (Signed)
Pt c/o itching, hoarseness, shortness of breath and difficulty swallowing.  MD notified and to bedside for re-assessment.

## 2022-06-02 NOTE — Progress Notes (Signed)
250 peak flow.

## 2022-06-02 NOTE — ED Triage Notes (Signed)
Pt was spraying bug spray in her home and felt like she was having an allergic reaction so she took her epipen around 1730 in right thigh. Pt's daughter called 911 for pt's wheezing and SOB  Pt was given 2 albuterol duonebs, 125 mg of solumedrol IV, and 2 g IV magnesium by EMS.Pt alert, dry cough, inspiratory and expiratory wheezes.

## 2022-06-02 NOTE — ED Notes (Signed)
Pt ambulatory to restroom, tolerated well. Pt states she is feeling some better, decreased shortness of breath, RR=18, denies difficulty swallowing.

## 2022-06-02 NOTE — ED Provider Notes (Signed)
[facility] Provider Note   Event Date/Time   First MD Initiated Contact with Patient 06/02/22 1806     (approximate) History  Respiratory Distress  HPI Danielle Harrison is a 46 y.o. female with a stated past medical history of asthma and allergy to NSAIDs who presents via EMS for an acute episode of shortness of breath and difficulty breathing after using and aerosolized cleaning solution.  Patient states that she did not know whether she was having allergic reaction or an asthma exacerbation and therefore took her intramuscular epinephrine pen as well as an albuterol inhaler.  Patient states that her shortness of breath did improve after this treatment. ROS: Patient currently denies any vision changes, tinnitus, difficulty speaking, facial droop, sore throat, chest pain, shortness of breath, abdominal pain, nausea/vomiting/diarrhea, dysuria, or weakness/numbness/paresthesias in any extremity   Physical Exam  Triage Vital Signs: ED Triage Vitals [06/02/22 1806]  Enc Vitals Group     BP      Pulse      Resp      Temp      Temp src      SpO2 100 %     Weight      Height      Head Circumference      Peak Flow      Pain Score      Pain Loc      Pain Edu?      Excl. in GC?    Most recent vital signs: Vitals:   06/02/22 2200 06/02/22 2252  BP: (!) 145/106 (!) 145/105  Pulse: (!) 119 (!) 108  Resp: (!) 21 20  Temp:  98.4 F (36.9 C)  SpO2: 100% 100%   General: Awake, oriented x4. CV:  Good peripheral perfusion.  Resp:  Normal effort.  No stridor.  Clear to auscultation bilaterally Abd:  No distention.  Other:  Middle-aged Caucasian female laying in bed in no acute distress ED Results / Procedures / Treatments  Labs (all labs ordered are listed, but only abnormal results are displayed) Labs Reviewed  BASIC METABOLIC PANEL - Abnormal; Notable for the following components:      Result Value   Glucose, Bld 199 (*)    All other components within  normal limits  CBC WITH DIFFERENTIAL/PLATELET - Abnormal; Notable for the following components:   Lymphs Abs 4.2 (*)    All other components within normal limits  RESP PANEL BY RT-PCR (FLU A&B, COVID) [id number]  POC URINE PREG, ED   EKG ED ECG REPORT I, Merwyn Katos, the attending physician, personally viewed and interpreted this ECG. Date: 06/02/2022 EKG Time: 1806 Rate: 130 Rhythm: Tachycardic sinus rhythm QRS Axis: normal Intervals: normal ST/T Wave abnormalities: normal Narrative Interpretation: no evidence of acute ischemia RADIOLOGY ED MD interpretation: One-view portable chest x-ray interpreted by me shows no evidence of acute abnormalities including no pneumonia, pneumothorax, or widened mediastinum -Agree with radiology assessment Official radiology report(s): DG Chest Port 1 View  Result Date: 06/02/2022 CLINICAL DATA:  Shortness of breath EXAM: PORTABLE CHEST 1 VIEW COMPARISON:  03/01/2014 FINDINGS: The heart size and mediastinal contours are within normal limits. Both lungs are clear. The visualized skeletal structures are unremarkable. IMPRESSION: No acute abnormality of the lungs in AP portable projection. Electronically Signed   By: Jearld Lesch M.D.   On: 06/02/2022 18:34   PROCEDURES: Critical Care performed: Yes, see critical care procedure note(s) .1-3 Lead EKG Interpretation  Performed by: Merwyn Katos,  MD Authorized by: Merwyn Katos, MD     Interpretation: abnormal     ECG rate:  108   ECG rate assessment: tachycardic     Rhythm: sinus tachycardia     Ectopy: none     Conduction: normal    MEDICATIONS ORDERED IN ED: Medications  albuterol (PROVENTIL) (2.5 MG/3ML) 0.083% nebulizer solution 3 mL (has no administration in time range)  ipratropium-albuterol (DUONEB) 0.5-2.5 (3) MG/3ML nebulizer solution 3 mL (3 mLs Nebulization Given 06/02/22 1821)  diphenhydrAMINE (BENADRYL) injection 25 mg (25 mg Intravenous Given 06/02/22 1938)   IMPRESSION / MDM /  ASSESSMENT AND PLAN / ED COURSE  I reviewed the triage vital signs and the nursing notes.                             The patient is on the cardiac monitor to evaluate for evidence of arrhythmia and/or significant heart rate changes. Patient's presentation is most consistent with acute presentation with potential threat to life or bodily function. + Cutaneous hives/erythema + Shortness of breath  Given history and exam, presentation most consistent with allergic reaction. I have low suspicion for toxic shock syndrome, asthma exacerbation, or drug toxicity. After 4 hours of observation given the patient did inject themselves with epinephrine, patient's symptoms did not recur Rx: Prednisone 60mg  qday x3days, Benadryl 25mg  q8hr x3days Disposition: Discharge home with SRP. Follow up with PCP in 1-2 days.   FINAL CLINICAL IMPRESSION(S) / ED DIAGNOSES   Final diagnoses:  Anaphylaxis, initial encounter  Allergic reaction, initial encounter   Rx / DC Orders   ED Discharge Orders          Ordered    predniSONE (DELTASONE) 50 MG tablet  Daily with breakfast        06/02/22 2227           Note:  This document was prepared using Dragon voice recognition software and may include unintentional dictation errors.   06/04/22, MD 06/02/22 [phone]

## 2022-06-02 NOTE — ED Notes (Signed)
RT called to check peak flow

## 2023-10-18 ENCOUNTER — Inpatient Hospital Stay: Payer: Medicaid Other

## 2023-10-18 ENCOUNTER — Inpatient Hospital Stay
Admission: EM | Admit: 2023-10-18 | Discharge: 2023-11-04 | DRG: 853 | Disposition: A | Discharge status: Home Health | Payer: Medicaid Other | Attending: Student | Admitting: Student

## 2023-10-18 ENCOUNTER — Other Ambulatory Visit: Payer: Self-pay

## 2023-10-18 ENCOUNTER — Emergency Department: Payer: Medicaid Other

## 2023-10-18 DIAGNOSIS — E876 Hypokalemia: Secondary | ICD-10-CM | POA: Diagnosis not present

## 2023-10-18 DIAGNOSIS — Z86718 Personal history of other venous thrombosis and embolism: Secondary | ICD-10-CM

## 2023-10-18 DIAGNOSIS — M7989 Other specified soft tissue disorders: Secondary | ICD-10-CM | POA: Diagnosis not present

## 2023-10-18 DIAGNOSIS — F419 Anxiety disorder, unspecified: Secondary | ICD-10-CM | POA: Diagnosis present

## 2023-10-18 DIAGNOSIS — E860 Dehydration: Secondary | ICD-10-CM | POA: Diagnosis not present

## 2023-10-18 DIAGNOSIS — R7303 Prediabetes: Secondary | ICD-10-CM | POA: Diagnosis present

## 2023-10-18 DIAGNOSIS — G8929 Other chronic pain: Secondary | ICD-10-CM | POA: Diagnosis present

## 2023-10-18 DIAGNOSIS — A4102 Sepsis due to Methicillin resistant Staphylococcus aureus: Principal | ICD-10-CM | POA: Diagnosis present

## 2023-10-18 DIAGNOSIS — R109 Unspecified abdominal pain: Secondary | ICD-10-CM | POA: Diagnosis not present

## 2023-10-18 DIAGNOSIS — J45909 Unspecified asthma, uncomplicated: Secondary | ICD-10-CM | POA: Diagnosis not present

## 2023-10-18 DIAGNOSIS — D75839 Thrombocytosis, unspecified: Secondary | ICD-10-CM | POA: Diagnosis present

## 2023-10-18 DIAGNOSIS — M48061 Spinal stenosis, lumbar region without neurogenic claudication: Secondary | ICD-10-CM | POA: Diagnosis not present

## 2023-10-18 DIAGNOSIS — E785 Hyperlipidemia, unspecified: Secondary | ICD-10-CM | POA: Diagnosis present

## 2023-10-18 DIAGNOSIS — M50221 Other cervical disc displacement at C4-C5 level: Secondary | ICD-10-CM | POA: Diagnosis not present

## 2023-10-18 DIAGNOSIS — K219 Gastro-esophageal reflux disease without esophagitis: Secondary | ICD-10-CM | POA: Diagnosis present

## 2023-10-18 DIAGNOSIS — D72829 Elevated white blood cell count, unspecified: Secondary | ICD-10-CM | POA: Diagnosis not present

## 2023-10-18 DIAGNOSIS — L02612 Cutaneous abscess of left foot: Secondary | ICD-10-CM | POA: Diagnosis not present

## 2023-10-18 DIAGNOSIS — M86072 Acute hematogenous osteomyelitis, left ankle and foot: Secondary | ICD-10-CM

## 2023-10-18 DIAGNOSIS — M47814 Spondylosis without myelopathy or radiculopathy, thoracic region: Secondary | ICD-10-CM | POA: Diagnosis not present

## 2023-10-18 DIAGNOSIS — R918 Other nonspecific abnormal finding of lung field: Secondary | ICD-10-CM | POA: Diagnosis not present

## 2023-10-18 DIAGNOSIS — B9562 Methicillin resistant Staphylococcus aureus infection as the cause of diseases classified elsewhere: Secondary | ICD-10-CM | POA: Diagnosis not present

## 2023-10-18 DIAGNOSIS — G2581 Restless legs syndrome: Secondary | ICD-10-CM | POA: Diagnosis present

## 2023-10-18 DIAGNOSIS — K567 Ileus, unspecified: Secondary | ICD-10-CM | POA: Diagnosis present

## 2023-10-18 DIAGNOSIS — K863 Pseudocyst of pancreas: Secondary | ICD-10-CM | POA: Diagnosis not present

## 2023-10-18 DIAGNOSIS — K859 Acute pancreatitis without necrosis or infection, unspecified: Principal | ICD-10-CM | POA: Diagnosis present

## 2023-10-18 DIAGNOSIS — K561 Intussusception: Secondary | ICD-10-CM | POA: Diagnosis not present

## 2023-10-18 DIAGNOSIS — E861 Hypovolemia: Secondary | ICD-10-CM | POA: Diagnosis not present

## 2023-10-18 DIAGNOSIS — R739 Hyperglycemia, unspecified: Secondary | ICD-10-CM | POA: Diagnosis present

## 2023-10-18 DIAGNOSIS — M47812 Spondylosis without myelopathy or radiculopathy, cervical region: Secondary | ICD-10-CM | POA: Diagnosis not present

## 2023-10-18 DIAGNOSIS — R7881 Bacteremia: Secondary | ICD-10-CM | POA: Diagnosis not present

## 2023-10-18 DIAGNOSIS — M47816 Spondylosis without myelopathy or radiculopathy, lumbar region: Secondary | ICD-10-CM | POA: Diagnosis not present

## 2023-10-18 DIAGNOSIS — M4316 Spondylolisthesis, lumbar region: Secondary | ICD-10-CM | POA: Diagnosis not present

## 2023-10-18 DIAGNOSIS — K852 Alcohol induced acute pancreatitis without necrosis or infection: Secondary | ICD-10-CM | POA: Diagnosis not present

## 2023-10-18 DIAGNOSIS — N179 Acute kidney failure, unspecified: Secondary | ICD-10-CM | POA: Diagnosis present

## 2023-10-18 DIAGNOSIS — M00072 Staphylococcal arthritis, left ankle and foot: Secondary | ICD-10-CM | POA: Diagnosis present

## 2023-10-18 DIAGNOSIS — M009 Pyogenic arthritis, unspecified: Secondary | ICD-10-CM

## 2023-10-18 DIAGNOSIS — M19072 Primary osteoarthritis, left ankle and foot: Secondary | ICD-10-CM | POA: Diagnosis present

## 2023-10-18 DIAGNOSIS — R0602 Shortness of breath: Secondary | ICD-10-CM | POA: Diagnosis not present

## 2023-10-18 DIAGNOSIS — Z79899 Other long term (current) drug therapy: Secondary | ICD-10-CM

## 2023-10-18 DIAGNOSIS — M4624 Osteomyelitis of vertebra, thoracic region: Secondary | ICD-10-CM | POA: Diagnosis not present

## 2023-10-18 DIAGNOSIS — I700 Atherosclerosis of aorta: Secondary | ICD-10-CM | POA: Diagnosis not present

## 2023-10-18 DIAGNOSIS — E871 Hypo-osmolality and hyponatremia: Secondary | ICD-10-CM | POA: Diagnosis not present

## 2023-10-18 DIAGNOSIS — M549 Dorsalgia, unspecified: Secondary | ICD-10-CM | POA: Diagnosis not present

## 2023-10-18 DIAGNOSIS — I10 Essential (primary) hypertension: Secondary | ICD-10-CM | POA: Diagnosis not present

## 2023-10-18 DIAGNOSIS — R509 Fever, unspecified: Secondary | ICD-10-CM | POA: Diagnosis not present

## 2023-10-18 DIAGNOSIS — K8522 Alcohol induced acute pancreatitis with infected necrosis: Secondary | ICD-10-CM | POA: Diagnosis not present

## 2023-10-18 DIAGNOSIS — M25475 Effusion, left foot: Secondary | ICD-10-CM | POA: Diagnosis not present

## 2023-10-18 DIAGNOSIS — Z87891 Personal history of nicotine dependence: Secondary | ICD-10-CM | POA: Diagnosis not present

## 2023-10-18 DIAGNOSIS — E8721 Acute metabolic acidosis: Secondary | ICD-10-CM | POA: Diagnosis not present

## 2023-10-18 DIAGNOSIS — R600 Localized edema: Secondary | ICD-10-CM | POA: Diagnosis not present

## 2023-10-18 DIAGNOSIS — K760 Fatty (change of) liver, not elsewhere classified: Secondary | ICD-10-CM | POA: Diagnosis not present

## 2023-10-18 DIAGNOSIS — M5021 Other cervical disc displacement,  high cervical region: Secondary | ICD-10-CM | POA: Diagnosis not present

## 2023-10-18 DIAGNOSIS — M5134 Other intervertebral disc degeneration, thoracic region: Secondary | ICD-10-CM | POA: Diagnosis not present

## 2023-10-18 DIAGNOSIS — M86172 Other acute osteomyelitis, left ankle and foot: Secondary | ICD-10-CM

## 2023-10-18 DIAGNOSIS — M129 Arthropathy, unspecified: Secondary | ICD-10-CM | POA: Diagnosis not present

## 2023-10-18 DIAGNOSIS — Z5971 Insufficient health insurance coverage: Secondary | ICD-10-CM

## 2023-10-18 DIAGNOSIS — M4802 Spinal stenosis, cervical region: Secondary | ICD-10-CM | POA: Diagnosis not present

## 2023-10-18 DIAGNOSIS — M419 Scoliosis, unspecified: Secondary | ICD-10-CM | POA: Diagnosis not present

## 2023-10-18 DIAGNOSIS — I330 Acute and subacute infective endocarditis: Secondary | ICD-10-CM

## 2023-10-18 LAB — CBC
HCT: 44.3 % (ref 36.0–46.0)
HCT: 41.5 % (ref 36.0–46.0)
Hemoglobin: 16.1 g/dL — ABNORMAL HIGH (ref 12.0–15.0)
Hemoglobin: 15.1 g/dL — ABNORMAL HIGH (ref 12.0–15.0)
MCH: 34.8 pg — ABNORMAL HIGH (ref 26.0–34.0)
MCH: 34.5 pg — ABNORMAL HIGH (ref 26.0–34.0)
MCHC: 36.3 g/dL — ABNORMAL HIGH (ref 30.0–36.0)
MCHC: 36.4 g/dL — ABNORMAL HIGH (ref 30.0–36.0)
MCV: 95.7 fL (ref 80.0–100.0)
MCV: 94.7 fL (ref 80.0–100.0)
Platelets: 401 10*3/uL — ABNORMAL HIGH (ref 150–400)
Platelets: 287 10*3/uL (ref 150–400)
RBC: 4.63 MIL/uL (ref 3.87–5.11)
RBC: 4.38 MIL/uL (ref 3.87–5.11)
RDW: 11.6 % (ref 11.5–15.5)
RDW: 11.5 % (ref 11.5–15.5)
WBC: 19.5 10*3/uL — ABNORMAL HIGH (ref 4.0–10.5)
WBC: 15.9 10*3/uL — ABNORMAL HIGH (ref 4.0–10.5)
nRBC: 0 % (ref 0.0–0.2)
nRBC: 0 % (ref 0.0–0.2)

## 2023-10-18 LAB — URINALYSIS, ROUTINE W REFLEX MICROSCOPIC
Glucose, UA: 150 mg/dL — AB
Ketones, ur: 5 mg/dL — AB
Leukocytes,Ua: NEGATIVE
Nitrite: NEGATIVE
Protein, ur: >=300 mg/dL — AB
Specific Gravity, Urine: 1.032 — ABNORMAL HIGH (ref 1.005–1.030)
Squamous Epithelial / HPF: >50 /[HPF] (ref 0–5)
pH: 5 (ref 5.0–8.0)

## 2023-10-18 LAB — BLOOD GAS, VENOUS
Acid-base deficit: 3.1 mmol/L — ABNORMAL HIGH (ref 0.0–2.0)
Bicarbonate: 21.2 mmol/L (ref 20.0–28.0)
O2 Saturation: 96 %
Patient temperature: 37
pCO2, Ven: 35 mm[Hg] — ABNORMAL LOW (ref 44–60)
pH, Ven: 7.39 (ref 7.25–7.43)
pO2, Ven: 69 mm[Hg] — ABNORMAL HIGH (ref 32–45)

## 2023-10-18 LAB — URINALYSIS, COMPLETE (UACMP) WITH MICROSCOPIC
Bacteria, UA: NONE SEEN
Bilirubin Urine: NEGATIVE
Glucose, UA: NEGATIVE mg/dL
Ketones, ur: NEGATIVE mg/dL
Leukocytes,Ua: NEGATIVE
Nitrite: NEGATIVE
Protein, ur: 30 mg/dL — AB
Specific Gravity, Urine: 1.028 (ref 1.005–1.030)
pH: 5 (ref 5.0–8.0)

## 2023-10-18 LAB — CREATININE, SERUM
Creatinine, Ser: 0.72 mg/dL (ref 0.44–1.00)
GFR, Estimated: >60 mL/min (ref >60)

## 2023-10-18 LAB — COMPREHENSIVE METABOLIC PANEL
ALT: 16 U/L (ref 0–44)
AST: 37 U/L (ref 15–41)
Albumin: 4.9 g/dL (ref 3.5–5.0)
Alkaline Phosphatase: 93 U/L (ref 38–126)
Anion gap: 19 — ABNORMAL HIGH (ref 5–15)
BUN: 33 mg/dL — ABNORMAL HIGH (ref 6–20)
CO2: 17 mmol/L — ABNORMAL LOW (ref 22–32)
Calcium: 10.2 mg/dL (ref 8.9–10.3)
Chloride: 87 mmol/L — ABNORMAL LOW (ref 98–111)
Creatinine, Ser: 0.98 mg/dL (ref 0.44–1.00)
GFR, Estimated: >60 mL/min (ref >60)
Glucose, Bld: 299 mg/dL — ABNORMAL HIGH (ref 70–99)
Potassium: 3.4 mmol/L — ABNORMAL LOW (ref 3.5–5.1)
Sodium: 123 mmol/L — ABNORMAL LOW (ref 135–145)
Total Bilirubin: 1.9 mg/dL — ABNORMAL HIGH (ref 0.0–1.2)
Total Protein: 8.9 g/dL — ABNORMAL HIGH (ref 6.5–8.1)

## 2023-10-18 LAB — POC URINE PREG, ED: Preg Test, Ur: NEGATIVE

## 2023-10-18 LAB — TSH: TSH: 1.979 u[IU]/mL (ref 0.350–4.500)

## 2023-10-18 LAB — LACTIC ACID, PLASMA
Lactic Acid, Venous: 1.2 mmol/L (ref 0.5–1.9)
Lactic Acid, Venous: 2.4 mmol/L (ref 0.5–1.9)

## 2023-10-18 LAB — LIPASE, BLOOD: Lipase: 554 U/L — ABNORMAL HIGH (ref 11–51)

## 2023-10-18 LAB — OSMOLALITY: Osmolality: 287 mosm/kg (ref 275–295)

## 2023-10-18 LAB — PHOSPHORUS: Phosphorus: 3.6 mg/dL (ref 2.5–4.6)

## 2023-10-18 LAB — SODIUM, URINE, RANDOM: Sodium, Ur: 18 mmol/L

## 2023-10-18 MED ORDER — ACETAMINOPHEN 650 MG RE SUPP
650.0000 mg | Freq: Four times a day (QID) | RECTAL | Status: DC | PRN
Start: 2023-10-18 — End: 2023-11-05

## 2023-10-18 MED ORDER — LORAZEPAM 2 MG/ML IJ SOLN
1.0000 mg | INTRAMUSCULAR | Status: AC | PRN
  Administered 2023-10-18: 2 mg via INTRAVENOUS
  Filled 2023-10-18: qty 1

## 2023-10-18 MED ORDER — ALBUTEROL SULFATE (2.5 MG/3ML) 0.083% IN NEBU: 2.5000 mg | INHALATION_SOLUTION | RESPIRATORY_TRACT | Status: DC | PRN

## 2023-10-18 MED ORDER — ONDANSETRON HCL 4 MG/2ML IJ SOLN
4.0000 mg | Freq: Four times a day (QID) | INTRAMUSCULAR | Status: DC | PRN
  Administered 2023-10-27: 4 mg via INTRAVENOUS

## 2023-10-18 MED ORDER — HEPARIN SODIUM (PORCINE) 5000 UNIT/ML IJ SOLN
5000.0000 [IU] | Freq: Three times a day (TID) | INTRAMUSCULAR | Status: DC
  Administered 2023-10-18 – 2023-10-20 (×5): 5000 [IU] via SUBCUTANEOUS
  Filled 2023-10-18 (×6): qty 1

## 2023-10-18 MED ORDER — IOHEXOL 300 MG/ML  SOLN
75.0000 mL | Freq: Once | INTRAMUSCULAR | Status: AC | PRN
  Administered 2023-10-18: 75 mL via INTRAVENOUS

## 2023-10-18 MED ORDER — SODIUM CHLORIDE 0.9 % IV BOLUS
1000.0000 mL | Freq: Once | INTRAVENOUS | Status: AC
  Administered 2023-10-18: 1000 mL via INTRAVENOUS

## 2023-10-18 MED ORDER — LORAZEPAM 1 MG PO TABS: 1.0000 mg | ORAL_TABLET | ORAL | Status: AC | PRN

## 2023-10-18 MED ORDER — FENTANYL CITRATE PF 50 MCG/ML IJ SOSY
75.0000 ug | PREFILLED_SYRINGE | Freq: Once | INTRAMUSCULAR | Status: AC
  Administered 2023-10-18: 75 ug via INTRAVENOUS
  Filled 2023-10-18: qty 2

## 2023-10-18 MED ORDER — THIAMINE HCL 100 MG/ML IJ SOLN
100.0000 mg | Freq: Every day | INTRAMUSCULAR | Status: DC
  Filled 2023-10-18: qty 2

## 2023-10-18 MED ORDER — ONDANSETRON HCL 4 MG/2ML IJ SOLN
4.0000 mg | Freq: Once | INTRAMUSCULAR | Status: AC
  Administered 2023-10-18: 4 mg via INTRAVENOUS
  Filled 2023-10-18: qty 2

## 2023-10-18 MED ORDER — ACETAMINOPHEN 325 MG PO TABS
650.0000 mg | ORAL_TABLET | Freq: Four times a day (QID) | ORAL | Status: DC | PRN
  Administered 2023-10-20 – 2023-11-04 (×21): 650 mg via ORAL
  Filled 2023-10-18 (×23): qty 2

## 2023-10-18 MED ORDER — ONDANSETRON HCL 4 MG PO TABS: 4.0000 mg | ORAL_TABLET | Freq: Four times a day (QID) | ORAL | Status: DC | PRN

## 2023-10-18 MED ORDER — ADULT MULTIVITAMIN W/MINERALS CH
1.0000 | ORAL_TABLET | Freq: Every day | ORAL | Status: DC
  Administered 2023-10-18 – 2023-11-04 (×17): 1 via ORAL
  Filled 2023-10-18 (×17): qty 1

## 2023-10-18 MED ORDER — LACTATED RINGERS IV SOLN: INTRAVENOUS | Status: AC

## 2023-10-18 MED ORDER — MORPHINE SULFATE (PF) 4 MG/ML IV SOLN
4.0000 mg | Freq: Once | INTRAVENOUS | Status: AC
  Administered 2023-10-18: 4 mg via INTRAVENOUS
  Filled 2023-10-18: qty 1

## 2023-10-18 MED ORDER — OXYCODONE HCL 5 MG PO TABS
5.0000 mg | ORAL_TABLET | ORAL | Status: DC | PRN
  Administered 2023-10-18 – 2023-10-21 (×9): 5 mg via ORAL
  Filled 2023-10-18 (×9): qty 1

## 2023-10-18 MED ORDER — HYDROMORPHONE HCL 1 MG/ML IJ SOLN: 0.5000 mg | INTRAMUSCULAR | Status: DC | PRN

## 2023-10-18 MED ORDER — GUAIFENESIN ER 600 MG PO TB12
600.0000 mg | ORAL_TABLET | Freq: Two times a day (BID) | ORAL | Status: DC
  Administered 2023-10-18 – 2023-11-01 (×28): 600 mg via ORAL
  Filled 2023-10-18 (×28): qty 1

## 2023-10-18 MED ORDER — HYDROMORPHONE HCL 1 MG/ML IJ SOLN
1.0000 mg | INTRAMUSCULAR | Status: DC | PRN
  Administered 2023-10-18 – 2023-10-19 (×2): 1 mg via INTRAVENOUS
  Filled 2023-10-18 (×3): qty 1

## 2023-10-18 MED ORDER — MAGNESIUM SULFATE 2 GM/50ML IV SOLN
2.0000 g | Freq: Once | INTRAVENOUS | Status: AC
  Administered 2023-10-18: 2 g via INTRAVENOUS
  Filled 2023-10-18: qty 50

## 2023-10-18 MED ORDER — FOLIC ACID 1 MG PO TABS
1.0000 mg | ORAL_TABLET | Freq: Every day | ORAL | Status: DC
  Administered 2023-10-18 – 2023-11-04 (×17): 1 mg via ORAL
  Filled 2023-10-18 (×17): qty 1

## 2023-10-18 MED ORDER — THIAMINE MONONITRATE 100 MG PO TABS
100.0000 mg | ORAL_TABLET | Freq: Every day | ORAL | Status: DC
  Administered 2023-10-18 – 2023-11-04 (×17): 100 mg via ORAL
  Filled 2023-10-18 (×17): qty 1

## 2023-10-18 NOTE — Consult Note (Signed)
 Subjective:   CC: Small bowel intussusception  HPI:  Danielle Harrison is a 48 y.o. female who was consulted by Debby for issue above.  Symptoms were first noted a few days ago.  Started with nausea vomiting, epigastric pain started today.  Pain is sharp, epigastric and left upper quadrant, similar to previous pancreatitis episodes.  Last BM a couple days ago.    Reports drinking 2 glasses of alcohol a day.  No recent binge drinking episodes.  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   Past Medical History:  has a past medical history of Anxiety, Arthritis, Asthma, Neuromuscular disorder (HCC), and Restless leg syndrome.  Past Surgical History:  Past Surgical History:  Procedure Laterality Date   BIOPSY  06/28/2019   Procedure: BIOPSY;  Surgeon: Janalyn Keene NOVAK, MD;  Location: [facility];  Service: Endoscopy;;   CESAREAN SECTION     x2   COLONOSCOPY WITH PROPOFOL  N/A 06/28/2019   Procedure: COLONOSCOPY WITH PROPOFOL ;  Surgeon: Janalyn Keene NOVAK, MD;  Location: [facility];  Service: Endoscopy;  Laterality: N/A;   ESOPHAGOGASTRODUODENOSCOPY (EGD) WITH PROPOFOL  N/A 06/28/2019   Procedure: ESOPHAGOGASTRODUODENOSCOPY (EGD) WITH PROPOFOL ;  Surgeon: Janalyn Keene NOVAK, MD;  Location: [facility];  Service: Endoscopy;  Laterality: N/A;    Family History: family history is not on file.  Social History:  reports that she has quit smoking. Her smoking use included cigarettes. She has a 3 pack-year smoking history. She has never used smokeless tobacco. She reports current alcohol use. She reports that she does not use drugs.  Current Medications:  Prior to Admission medications   Medication Sig Start Date End Date Taking? Authorizing Provider  albuterol  (VENTOLIN  HFA) 108 (90 Base) MCG/ACT inhaler SMARTSIG:1 Puff(s) By Mouth Every 6 Hours PRN 12/28/19   [provider]  amLODipine   (NORVASC ) 10 MG tablet Take 1 tablet (10 mg total) by mouth daily for 3 days. 02/01/22 02/04/22  Ghimire, Kuber, MD  cetirizine  (ZYRTEC ) 10 MG tablet Take 4 tablets (40 mg total) by mouth daily for 5 days. 06/10/21 06/15/21  Cuthriell, Dorn BIRCH, PA-C  famotidine  (PEPCID ) 20 MG tablet Take 1 tablet (20 mg total) by mouth daily. 06/10/21 06/10/22  Cuthriell, Dorn BIRCH, PA-C  ondansetron  (ZOFRAN ) 4 MG tablet Take 1 tablet (4 mg total) by mouth every 6 (six) hours as needed for nausea. 02/01/22   Raenelle Coria, MD    Allergies:  Allergies as of 10/18/2023 - Review Complete 10/18/2023  Allergen Reaction Noted   Diclofenac Hives 06/02/2022    ROS:  General: Denies weight loss, weight gain, fatigue, fevers, chills, and night sweats. Eyes: Denies blurry vision, double vision, eye pain, itchy eyes, and tearing. Ears: Denies hearing loss, earache, and ringing in ears. Nose: Denies sinus pain, congestion, infections, runny nose, and nosebleeds. Mouth/throat: Denies hoarseness, sore throat, bleeding gums, and difficulty swallowing. Heart: Denies chest pain, palpitations, racing heart, irregular heartbeat, leg pain or swelling, and decreased activity tolerance. Respiratory: Denies breathing difficulty, shortness of breath, wheezing, cough, and sputum. GI: Denies change in appetite, heartburn, nausea, vomiting, constipation, diarrhea, and blood in stool. GU: Denies difficulty urinating, pain with urinating, urgency, frequency, blood in urine. Musculoskeletal: Denies joint stiffness, pain, swelling, muscle weakness. Skin: Denies rash, itching, mass, tumors, sores, and boils Neurologic: Denies headache, fainting, dizziness, seizures, numbness, and tingling. Psychiatric: Denies depression, anxiety, difficulty sleeping, and memory loss. Endocrine: Denies heat or cold intolerance, and increased thirst  or urination. Blood/lymph: Denies easy bruising, easy bruising, and swollen glands     Objective:     BP  (!) 174/107   Pulse (!) 106   Temp 98.6 F (37 C) (Oral)   Resp (!) 27   Ht 5' (1.524 m)   Wt 54.4 kg   SpO2 100%   BMI 23.44 kg/m   Constitutional :  Alert and oriented, not in acute distress  Lymphatics/Throat:  no asymmetry, masses, or scars  Respiratory:  clear to auscultation bilaterally  Cardiovascular:  regular rate and rhythm  Gastrointestinal: Soft, no guarding, no distention, minor tenderness to palpation epigastric .   Musculoskeletal: Steady movement  Skin: Cool and moist,   Psychiatric: Normal affect, non-agitated, not confused       LABS:     Latest Ref Rng & Units 10/18/2023    6:27 PM 10/18/2023    1:04 PM 06/02/2022    6:02 PM  CMP  Glucose 70 - 99 mg/dL  700  800   BUN 6 - 20 mg/dL  33  15   Creatinine 9.55 - 1.00 mg/dL 9.27  9.01  9.30   Sodium 135 - 145 mmol/L  123  137   Potassium 3.5 - 5.1 mmol/L  3.4  3.5   Chloride 98 - 111 mmol/L  87  100   CO2 22 - 32 mmol/L  17  23   Calcium 8.9 - 10.3 mg/dL  89.7  9.5   Total Protein 6.5 - 8.1 g/dL  8.9    Total Bilirubin 0.0 - 1.2 mg/dL  1.9    Alkaline Phos 38 - 126 U/L  93    AST 15 - 41 U/L  37    ALT 0 - 44 U/L  16        Latest Ref Rng & Units 10/18/2023    6:27 PM 10/18/2023    1:04 PM 06/02/2022    6:02 PM  CBC  WBC 4.0 - 10.5 K/uL 15.9  19.5  9.7   Hemoglobin 12.0 - 15.0 g/dL 84.8  83.8  86.1   Hematocrit 36.0 - 46.0 % 41.5  44.3  42.1   Platelets 150 - 400 K/uL 287  401  337     RADS: CLINICAL DATA:  Pancreatitis.   EXAM: CT ABDOMEN AND PELVIS WITH CONTRAST   TECHNIQUE: Multidetector CT imaging of the abdomen and pelvis was performed using the standard protocol following bolus administration of intravenous contrast.   RADIATION DOSE REDUCTION: This exam was performed according to the departmental dose-optimization program which includes automated exposure control, adjustment of the mA and/or kV according to patient size and/or use of iterative reconstruction technique.   CONTRAST:  75mL  OMNIPAQUE  IOHEXOL  300 MG/ML  SOLN   COMPARISON:  01/30/2022.   FINDINGS: Lower chest: Lung bases are clear. No pericardial or pleural effusions.   Hepatobiliary: No focal liver abnormality is seen. No gallstones, gallbladder wall thickening, or biliary dilatation.   Pancreas: Decreased attenuation identified of the mid body and tail of the pancreas adjacent peripancreatic fat stranding. These findings suggest acute pancreatitis. The hypoattenuation in the pancreatic parenchyma could represent a developing pseudocyst, but underlying parenchymal lesions not excluded. This represents an interval change from the prior study. For this reason, follow up is recommended to ensure resolution and exclude underlying parenchymal lesions. No fluid collections to suggest abscess and no evidence of pancreatic hemorrhage.   Spleen: Normal in size without focal abnormality.   Adrenals/Urinary Tract: Adrenal glands are  unremarkable. Kidneys are normal, without renal calculi, focal lesion, or hydronephrosis. Bladder is unremarkable.   Stomach/Bowel: Unremarkable appearance of the stomach. No bowel dilatation to suggest obstruction. Note is made of a focal small bowel intussusception in the left midabdomen. No lesion is identified to explain presence of intussusception. Follow up suggested. Loops of small bowel are fluid-filled and mildly dilated in the mid to lower abdomen likely reactive ileus. Normal stool and air in the colon and rectosigmoid. Appendix is not seen and there is no suggestion of appendicitis.   Vascular/Lymphatic: Aortic atherosclerosis. No enlarged abdominal or pelvic lymph nodes.   Reproductive: Uterus and bilateral adnexa are unremarkable.   Other: No abdominal wall hernia or abnormality. No abdominopelvic ascites.   Musculoskeletal: Grade 1 L5 retrolisthesis. L4-5 disc space narrowing consistent with degenerative disc disease.   IMPRESSION: 1. Findings consistent  with acute pancreatitis. Hypoattenuation in the pancreatic parenchyma could represent a developing pseudocyst. Follow up is recommended to ensure resolution and exclude underlying parenchymal lesions. 2. Focal small bowel intussusception in the left midabdomen. Follow up suggested. 3. Probable reactive small bowel ileus. 4. Aortic atherosclerosis (ICD10-I70.0).     Electronically Signed   By: Fonda Field M.D.   On: 10/18/2023 17:12   Assessment:   Pancreatitis  Small bowel intussusception  Plan:   Hospitalist managing the pancreatitis but surgery consulted for incidental intussusception of mid small bowel.  No obvious transition point and patient has been having bowel movements despite nausea vomiting so doubt any clinical significance with the intussusception.    Continue to monitor with serial abdominal exams while treating pancreatitis.  Recommend right upper quadrant ultrasound to ensure no gallstones are involved as potential cause for the repeat pancreatitis.  Okay to continue clear liquid diet for now. labs/images/medications/previous chart entries reviewed personally and relevant changes/updates noted above.

## 2023-10-18 NOTE — ED Provider Notes (Signed)
 [facility] Provider Note    Event Date/Time   First MD Initiated Contact with Patient 10/18/23 1522     (approximate)   History   Abdominal Pain   HPI  Danielle Harrison is a 48 y.o. female who presents to the emergency department today because of concerns for abdominal pain and nausea and vomiting.  The abdominal pain started yesterday.  The nausea and vomiting started 6 days ago.  Patient does have history of pancreatitis and this reminds her of her previous episodes of pancreatitis.  She did drink alcohol prior to the nausea and vomiting starting.  She denies any fevers although she states she has had some chills.     Physical Exam   Triage Vital Signs: ED Triage Vitals [10/18/23 1301]  Encounter Vitals Group     BP (!) 127/100     Systolic BP Percentile      Diastolic BP Percentile      Pulse Rate (!) 130     Resp 18     Temp 98.5 F (36.9 C)     Temp Source Oral     SpO2 100 %     Weight 120 lb (54.4 kg)     Height 5' (1.524 m)     Head Circumference      Peak Flow      Pain Score 0     Pain Loc      Pain Education      Exclude from Growth Chart     Most recent vital signs: Vitals:   10/18/23 1301  BP: (!) 127/100  Pulse: (!) 130  Resp: 18  Temp: 98.5 F (36.9 C)  SpO2: 100%   General: Awake, alert, oriented. CV:  Good peripheral perfusion. Tachycardia. Resp:  Normal effort. Lungs clear. Abd:  No distention. Tender to palpation in the epigastric region.  ED Results / Procedures / Treatments   Labs (all labs ordered are listed, but only abnormal results are displayed) Labs Reviewed  LIPASE, BLOOD - Abnormal; Notable for the following components:      Result Value   Lipase 554 (*)    All other components within normal limits  COMPREHENSIVE METABOLIC PANEL - Abnormal; Notable for the following components:   Sodium 123 (*)    Potassium 3.4 (*)    Chloride 87 (*)    CO2 17 (*)    Glucose, Bld 299 (*)    BUN 33 (*)     Total Protein 8.9 (*)    Total Bilirubin 1.9 (*)    Anion gap 19 (*)    All other components within normal limits  CBC - Abnormal; Notable for the following components:   WBC 19.5 (*)    Hemoglobin 16.1 (*)    MCH 34.8 (*)    MCHC 36.3 (*)    Platelets 401 (*)    All other components within normal limits  URINALYSIS, ROUTINE W REFLEX MICROSCOPIC - Abnormal; Notable for the following components:   Color, Urine AMBER (*)    APPearance TURBID (*)    Specific Gravity, Urine 1.032 (*)    Glucose, UA 150 (*)    Hgb urine dipstick MODERATE (*)    Bilirubin Urine SMALL (*)    Ketones, ur 5 (*)    Protein, ur >=300 (*)    Bacteria, UA FEW (*)    All other components within normal limits  POC URINE PREG, ED   None   RADIOLOGY I independently interpreted  and visualized the CT abd/pel. My interpretation: pancreatitis Radiology interpretation:  IMPRESSION:  1. Findings consistent with acute pancreatitis. Hypoattenuation in  the pancreatic parenchyma could represent a developing pseudocyst.  Follow up is recommended to ensure resolution and exclude underlying  parenchymal lesions.  2. Focal small bowel intussusception in the left midabdomen. Follow  up suggested.  3. Probable reactive small bowel ileus.  4. Aortic atherosclerosis (ICD10-I70.0).      PROCEDURES:  Critical Care performed: No    MEDICATIONS ORDERED IN ED: Medications - No data to display   IMPRESSION / MDM / ASSESSMENT AND PLAN / ED COURSE  I reviewed the triage vital signs and the nursing notes.                              Differential diagnosis includes, but is not limited to, pancreatitis, biliary disease, gastritis  Patient's presentation is most consistent with acute presentation with potential threat to life or bodily function.  Patient presented to the emergency department today because of concerns for abdominal pain, nausea vomiting in setting of history of pancreatitis.  On exam patient is  tender to the epigastric region.  Blood work is consistent with pancreatitis with elevated lipase.  Patient additionally has significant leukocytosis.  Will obtain CT scan to evaluate for possible abscess or necrosis.  Will give IV fluids and pain medication.  CT scan consistent with acute pancreatitis.  Does show possible developing pseudocyst.  Discussed with Dr. Debby with the hospitalist service who will evaluate for admission.   FINAL CLINICAL IMPRESSION(S) / ED DIAGNOSES   Final diagnoses:  Acute pancreatitis, unspecified complication status, unspecified pancreatitis type     Note:  This document was prepared using Dragon voice recognition software and may include unintentional dictation errors.    Floy Roberts, MD 10/18/23 1739

## 2023-10-18 NOTE — ED Notes (Signed)
 Critical Lactic - 2.4 Provider notified.

## 2023-10-18 NOTE — H&P (Signed)
 History and Physical    Danielle Harrison FMW:[id number] DOB: 05-14-1976 DOA: 10/18/2023  PCP: [facility]  Patient coming from: home  I have personally briefly reviewed patient's old medical records in [facility]  Chief Complaint: n/v severe abominal pain  HPI: Danielle Harrison is a 48 y.o. female with medical history significant of  Anxiety, Asthma, HLD, HTN restless leg syndrome, etoh abuse, hx of etoh pancreatitis who presents to ED with n/v/ severe abdominal pain x 48 hours.  She notes  pain mid to lower abdomen and radiates towards her back. She note no fever /chills/ chest pain or sob. Patient also noted normal bowel movements, no diarrhea or constipation. Notes n/v improved s/p treatment in ED. Currently pain is a 8/10. Patient states  she  last drank 4 days ago. She denies any history of severe withdrawals.  ED Course:  Afeb, bp 127/100,  HR130, rr 18, sat 100%   Wbc 19.5, hgb 16.1, Plt 401 Lipase 554 Na 123(137), K 3.4, bicarb 17, glu 299, cr 0.98 ( 0.69) tbili 1.9 AG19 NS1L LJ:ennm sample  Tx  Zofran ,morphine   CTAB 1. Findings consistent with acute pancreatitis. Hypoattenuation in the pancreatic parenchyma could represent a developing pseudocyst. Follow up is recommended to ensure resolution and exclude underlying parenchymal lesions. 2. Focal small bowel intussusception in the left midabdomen. Follow up suggested. 3. Probable reactive small bowel ileus. 4. Aortic atherosclerosis (ICD10-I70.0).  Review of Systems: As per HPI otherwise 10 point review of systems negative.   Past Medical History:  Diagnosis Date   Anxiety    Arthritis    back   Asthma    Neuromuscular disorder (HCC)    weakness and numbness    Restless leg syndrome     Past Surgical History:  Procedure Laterality Date   BIOPSY  06/28/2019   Procedure: BIOPSY;  Surgeon: Janalyn Keene NOVAK, MD;  Location: [facility];  Service: Endoscopy;;   CESAREAN SECTION     x2    COLONOSCOPY WITH PROPOFOL  N/A 06/28/2019   Procedure: COLONOSCOPY WITH PROPOFOL ;  Surgeon: Janalyn Keene NOVAK, MD;  Location: [facility];  Service: Endoscopy;  Laterality: N/A;   ESOPHAGOGASTRODUODENOSCOPY (EGD) WITH PROPOFOL  N/A 06/28/2019   Procedure: ESOPHAGOGASTRODUODENOSCOPY (EGD) WITH PROPOFOL ;  Surgeon: Janalyn Keene NOVAK, MD;  Location: [facility];  Service: Endoscopy;  Laterality: N/A;     reports that she has quit smoking. Her smoking use included cigarettes. She has a 3 pack-year smoking history. She has never used smokeless tobacco. She reports current alcohol use. She reports that she does not use drugs.  Allergies  Allergen Reactions   Diclofenac Hives    Family History  Problem Relation Age of Onset   Colon cancer Neg Hx     Prior to Admission medications   Medication Sig Start Date End Date Taking? Authorizing Provider  albuterol  (VENTOLIN  HFA) 108 (90 Base) MCG/ACT inhaler SMARTSIG:1 Puff(s) By Mouth Every 6 Hours PRN 12/28/19   [provider]  amLODipine  (NORVASC ) 10 MG tablet Take 1 tablet (10 mg total) by mouth daily for 3 days. 02/01/22 02/04/22  Ghimire, Kuber, MD  cetirizine  (ZYRTEC ) 10 MG tablet Take 4 tablets (40 mg total) by mouth daily for 5 days. 06/10/21 06/15/21  Cuthriell, Dorn BIRCH, PA-C  famotidine  (PEPCID ) 20 MG tablet Take 1 tablet (20 mg total) by mouth daily. 06/10/21 06/10/22  Cuthriell, Dorn BIRCH, PA-C  ondansetron  (ZOFRAN ) 4 MG tablet Take 1 tablet (4 mg total) by mouth every  6 (six) hours as needed for nausea. 02/01/22   Raenelle Coria, MD    Physical Exam: Vitals:   10/18/23 1301 10/18/23 1615 10/18/23 1630 10/18/23 1723  BP: (!) 127/100  (!) 177/108   Pulse: (!) 130 (!) 110 (!) 131   Resp: 18  18   Temp: 98.5 F (36.9 C)   98.6 F (37 C)  TempSrc: Oral   Oral  SpO2: 100% 100% 100%   Weight: 54.4 kg     Height: 5' (1.524 m)       Constitutional: NAD, calm, comfortable Vitals:   10/18/23 1301 10/18/23 1615  10/18/23 1630 10/18/23 1723  BP: (!) 127/100  (!) 177/108   Pulse: (!) 130 (!) 110 (!) 131   Resp: 18  18   Temp: 98.5 F (36.9 C)   98.6 F (37 C)  TempSrc: Oral   Oral  SpO2: 100% 100% 100%   Weight: 54.4 kg     Height: 5' (1.524 m)      Eyes: PERRL, lids and conjunctivae normal ENMT: Mucous membranes are moist. Posterior pharynx clear of any exudate or lesions.Normal dentition.  Neck: normal, supple, no masses, no thyromegaly Respiratory: clear to auscultation bilaterally, no wheezing, no crackles. Normal respiratory effort. No accessory muscle use.  Cardiovascular: Regular rate and rhythm, no murmurs / rubs / gallops. No extremity edema. 2+ pedal pulses. No carotid bruits.  Abdomen: + epigastric area mid abdomen and lower left quadrant tenderness, no masses palpated. No hepatosplenomegaly. Bowel sounds positive.  Musculoskeletal: no clubbing / cyanosis. No joint deformity upper and lower extremities. Good ROM, no contractures. Normal muscle tone.  Skin: no rashes, lesions, ulcers. No induration Neurologic: CN 2-12 grossly intact. Sensation intact. Strength 5/5 in all 4.  Psychiatric: Normal judgment and insight. Alert and oriented x 3. Normal mood.    Labs on Admission: I have personally reviewed following labs and imaging studies  CBC: Recent Labs  Lab 10/18/23 1304  WBC 19.5*  HGB 16.1*  HCT 44.3  MCV 95.7  PLT 401*   Basic Metabolic Panel: Recent Labs  Lab 10/18/23 1304  NA 123*  K 3.4*  CL 87*  CO2 17*  GLUCOSE 299*  BUN 33*  CREATININE 0.98  CALCIUM 10.2   GFR: Estimated Creatinine Clearance: 51 mL/min (by C-G formula based on SCr of 0.98 mg/dL). Liver Function Tests: Recent Labs  Lab 10/18/23 1304  AST 37  ALT 16  ALKPHOS 93  BILITOT 1.9*  PROT 8.9*  ALBUMIN 4.9   Recent Labs  Lab 10/18/23 1304  LIPASE 554*   No results for input(s): AMMONIA in the last 168 hours. Coagulation Profile: No results for input(s): INR, PROTIME in the  last 168 hours. Cardiac Enzymes: No results for input(s): CKTOTAL, CKMB, CKMBINDEX, TROPONINI in the last 168 hours. BNP (last 3 results) No results for input(s): PROBNP in the last 8760 hours. HbA1C: No results for input(s): HGBA1C in the last 72 hours. CBG: No results for input(s): GLUCAP in the last 168 hours. Lipid Profile: No results for input(s): CHOL, HDL, LDLCALC, TRIG, CHOLHDL, LDLDIRECT in the last 72 hours. Thyroid  Function Tests: No results for input(s): TSH, T4TOTAL, FREET4, T3FREE, THYROIDAB in the last 72 hours. Anemia Panel: No results for input(s): VITAMINB12, FOLATE, FERRITIN, TIBC, IRON, RETICCTPCT in the last 72 hours. Urine analysis:    Component Value Date/Time   COLORURINE AMBER (A) 10/18/2023 1617   APPEARANCEUR TURBID (A) 10/18/2023 1617   APPEARANCEUR Clear 03/01/2014 1301   LABSPEC 1.032 (  H) 10/18/2023 1617   LABSPEC 1.012 03/01/2014 1301   PHURINE 5.0 10/18/2023 1617   GLUCOSEU 150 (A) 10/18/2023 1617   GLUCOSEU 50 mg/dL 94/80/7984 8698   HGBUR MODERATE (A) 10/18/2023 1617   BILIRUBINUR SMALL (A) 10/18/2023 1617   BILIRUBINUR Negative 03/01/2014 1301   KETONESUR 5 (A) 10/18/2023 1617   PROTEINUR >=300 (A) 10/18/2023 1617   NITRITE NEGATIVE 10/18/2023 1617   LEUKOCYTESUR NEGATIVE 10/18/2023 1617   LEUKOCYTESUR Negative 03/01/2014 1301    Radiological Exams on Admission: CT ABDOMEN PELVIS W CONTRAST Result Date: 10/18/2023 CLINICAL DATA:  Pancreatitis. EXAM: CT ABDOMEN AND PELVIS WITH CONTRAST TECHNIQUE: Multidetector CT imaging of the abdomen and pelvis was performed using the standard protocol following bolus administration of intravenous contrast. RADIATION DOSE REDUCTION: This exam was performed according to the departmental dose-optimization program which includes automated exposure control, adjustment of the mA and/or kV according to patient size and/or use of iterative reconstruction technique.  CONTRAST:  75mL OMNIPAQUE  IOHEXOL  300 MG/ML  SOLN COMPARISON:  01/30/2022. FINDINGS: Lower chest: Lung bases are clear. No pericardial or pleural effusions. Hepatobiliary: No focal liver abnormality is seen. No gallstones, gallbladder wall thickening, or biliary dilatation. Pancreas: Decreased attenuation identified of the mid body and tail of the pancreas adjacent peripancreatic fat stranding. These findings suggest acute pancreatitis. The hypoattenuation in the pancreatic parenchyma could represent a developing pseudocyst, but underlying parenchymal lesions not excluded. This represents an interval change from the prior study. For this reason, follow up is recommended to ensure resolution and exclude underlying parenchymal lesions. No fluid collections to suggest abscess and no evidence of pancreatic hemorrhage. Spleen: Normal in size without focal abnormality. Adrenals/Urinary Tract: Adrenal glands are unremarkable. Kidneys are normal, without renal calculi, focal lesion, or hydronephrosis. Bladder is unremarkable. Stomach/Bowel: Unremarkable appearance of the stomach. No bowel dilatation to suggest obstruction. Note is made of a focal small bowel intussusception in the left midabdomen. No lesion is identified to explain presence of intussusception. Follow up suggested. Loops of small bowel are fluid-filled and mildly dilated in the mid to lower abdomen likely reactive ileus. Normal stool and air in the colon and rectosigmoid. Appendix is not seen and there is no suggestion of appendicitis. Vascular/Lymphatic: Aortic atherosclerosis. No enlarged abdominal or pelvic lymph nodes. Reproductive: Uterus and bilateral adnexa are unremarkable. Other: No abdominal wall hernia or abnormality. No abdominopelvic ascites. Musculoskeletal: Grade 1 L5 retrolisthesis. L4-5 disc space narrowing consistent with degenerative disc disease. IMPRESSION: 1. Findings consistent with acute pancreatitis. Hypoattenuation in the pancreatic  parenchyma could represent a developing pseudocyst. Follow up is recommended to ensure resolution and exclude underlying parenchymal lesions. 2. Focal small bowel intussusception in the left midabdomen. Follow up suggested. 3. Probable reactive small bowel ileus. 4. Aortic atherosclerosis (ICD10-I70.0). Electronically Signed   By: Fonda Field M.D.   On: 10/18/2023 17:12    EKG: Independently reviewed. N/a  Assessment/Plan  Acute ETOH pancreatitis  -admit to progressive care  -supportive care with ivfs, antiemetics, pain medications  Mild AKI Acute metabolic acidosis  - ? Due to starvation ketosis/ lactic acidosis  - check vbg  - ketones/ lactic acid -continue with ivfs  -hold nephrotoxic medications   Hyponatremia, presumed due to hypovolemia  - check serum osmo, urine na  -continue ivfs  -monitor NA q6hours   Hyperglycemia  -no prior hx of DMII -check  A1C  Hx of HTN -states she has not  been on medications in some time due to loss of insurance  HLD -not taking medications  -  due to loss of insurance  ETOH  - place on ciwa   Leukocytosis -presumed acute phase reactant  - however will r/o infection  -f/u with repeat ua and cxr   Focal small bowel intussusception in the left mid-abdomen Reactive ileus  -surgery consult dr Marea -npo status   DVT prophylaxis: heparin  Code Status: full/ as discussed per patient wishes in event of cardiac arrest  Family Communication: none at bedside Disposition Plan: patient  expected to be admitted greater than 2 midnights  Consults called:  Surgery consult Dr Marea Admission status: progressive care   Camila DELENA Ned MD [facility]   If 7PM-7AM, please contact night-coverage [email or web address] Password [facility]  10/18/2023, 5:41 PM

## 2023-10-18 NOTE — ED Triage Notes (Signed)
 Pt presents with ABD pain for 2 days, pt states N/V, pt states HX of pancreatitis, pt states she does drink. Pt states it feels like her pancreatitis. NAD noted.

## 2023-10-19 ENCOUNTER — Inpatient Hospital Stay: Payer: Medicaid Other

## 2023-10-19 DIAGNOSIS — K561 Intussusception: Secondary | ICD-10-CM | POA: Diagnosis not present

## 2023-10-19 DIAGNOSIS — K859 Acute pancreatitis without necrosis or infection, unspecified: Secondary | ICD-10-CM | POA: Diagnosis not present

## 2023-10-19 HISTORY — DX: Intussusception: K56.1

## 2023-10-19 LAB — CBC
HCT: 39.3 % (ref 36.0–46.0)
Hemoglobin: 14 g/dL (ref 12.0–15.0)
MCH: 34.5 pg — ABNORMAL HIGH (ref 26.0–34.0)
MCHC: 35.6 g/dL (ref 30.0–36.0)
MCV: 96.8 fL (ref 80.0–100.0)
Platelets: 255 10*3/uL (ref 150–400)
RBC: 4.06 MIL/uL (ref 3.87–5.11)
RDW: 11.7 % (ref 11.5–15.5)
WBC: 17.4 10*3/uL — ABNORMAL HIGH (ref 4.0–10.5)
nRBC: 0 % (ref 0.0–0.2)

## 2023-10-19 LAB — COMPREHENSIVE METABOLIC PANEL
ALT: 13 U/L (ref 0–44)
AST: 27 U/L (ref 15–41)
Albumin: 3.7 g/dL (ref 3.5–5.0)
Alkaline Phosphatase: 79 U/L (ref 38–126)
Anion gap: 12 (ref 5–15)
BUN: 15 mg/dL (ref 6–20)
CO2: 25 mmol/L (ref 22–32)
Calcium: 9.2 mg/dL (ref 8.9–10.3)
Chloride: 92 mmol/L — ABNORMAL LOW (ref 98–111)
Creatinine, Ser: 0.58 mg/dL (ref 0.44–1.00)
GFR, Estimated: >60 mL/min (ref >60)
Glucose, Bld: 192 mg/dL — ABNORMAL HIGH (ref 70–99)
Potassium: 3.8 mmol/L (ref 3.5–5.1)
Sodium: 129 mmol/L — ABNORMAL LOW (ref 135–145)
Total Bilirubin: 1.4 mg/dL — ABNORMAL HIGH (ref 0.0–1.2)
Total Protein: 7.2 g/dL (ref 6.5–8.1)

## 2023-10-19 LAB — PROTIME-INR
INR: 1.1 (ref 0.8–1.2)
Prothrombin Time: 14 s (ref 11.4–15.2)

## 2023-10-19 LAB — MAGNESIUM: Magnesium: 2.7 mg/dL — ABNORMAL HIGH (ref 1.7–2.4)

## 2023-10-19 LAB — BASIC METABOLIC PANEL
Anion gap: 16 — ABNORMAL HIGH (ref 5–15)
BUN: 23 mg/dL — ABNORMAL HIGH (ref 6–20)
CO2: 18 mmol/L — ABNORMAL LOW (ref 22–32)
Calcium: 9.4 mg/dL (ref 8.9–10.3)
Chloride: 95 mmol/L — ABNORMAL LOW (ref 98–111)
Creatinine, Ser: 0.67 mg/dL (ref 0.44–1.00)
GFR, Estimated: >60 mL/min (ref >60)
Glucose, Bld: 220 mg/dL — ABNORMAL HIGH (ref 70–99)
Potassium: 3.7 mmol/L (ref 3.5–5.1)
Sodium: 129 mmol/L — ABNORMAL LOW (ref 135–145)

## 2023-10-19 LAB — SODIUM: Sodium: 131 mmol/L — ABNORMAL LOW (ref 135–145)

## 2023-10-19 LAB — LACTIC ACID, PLASMA: Lactic Acid, Venous: 1.7 mmol/L (ref 0.5–1.9)

## 2023-10-19 LAB — HIV ANTIBODY (ROUTINE TESTING W REFLEX): HIV Screen 4th Generation wRfx: NONREACTIVE

## 2023-10-19 MED ORDER — METOPROLOL SUCCINATE ER 25 MG PO TB24
25.0000 mg | ORAL_TABLET | Freq: Every day | ORAL | Status: DC
  Administered 2023-10-19 – 2023-10-22 (×4): 25 mg via ORAL
  Filled 2023-10-19 (×4): qty 1

## 2023-10-19 NOTE — ED Notes (Signed)
Pt up to the bathroom. Steady gait.

## 2023-10-19 NOTE — TOC Initial Note (Signed)
 Transition of Care [facility]) - Initial/Assessment Note    Patient Details  Name: Danielle Harrison MRN: [id number] Date of Birth: [birth year]  [facility] of [facility] CM/SW Contact:    Leita CHRISTELLA Carlota Liane, LCSWA Phone Number: 10/19/2023, 11:47 AM  Clinical Narrative:                    CSW received consult for SA resources. SA resources added to AVS.     Patient Goals and CMS Choice            Expected Discharge Plan and Services                                              Prior Living Arrangements/Services                       Activities of Daily Living      Permission Sought/Granted                  Emotional Assessment              Admission diagnosis:  Acute pancreatitis [K85.90] Patient Active Problem List   Diagnosis Date Noted   Small bowel intussusception (HCC) 10/19/2023   Abdominal pain 01/29/2022   Acute pancreatitis 02/27/2021   Chronic pain 02/27/2021   Asthma 02/27/2021   Stomach ache    Diarrhea    Stomach irritation    PCP:  [facility] Pharmacy:   [id number] [address] [place],  - [address] [place] [address] Phone: [phone] Fax: [phone]     Social Drivers of Health (SDOH) Social History: SDOH Screenings   Tobacco Use: Medium Risk (10/18/2023)   SDOH Interventions:     Readmission Risk Interventions     No data to display

## 2023-10-19 NOTE — Hospital Course (Signed)
 Hospital course / significant events:   HPI: Danielle Harrison is a 48 y.o. female with medical history Anxiety, Asthma, HLD, HTN restless leg syndrome, etoh abuse, hx of etoh pancreatitis who presents to ED with n/v/ severe abdominal pain x 48 hours.  She notes  pain mid to lower abdomen and radiates towards her back. She note no fever /chills/ chest pain or sob. Patient also noted normal bowel movements, no diarrhea or constipation. Notes n/v improved s/p treatment in ED. Currently pain is a 8/10. Patient states  she  last drank 4 days ago. She denies any history of severe withdrawal  01/04: admitted to hospitalist service, general surgery consulted  01/05: remains (+)SIRS. No procedures per gen surg.      Consultants:  General surgery   Procedures/Surgeries: none      ASSESSMENT & PLAN:   Acute ETOH pancreatitis  Pancreatic pseudocyst supportive care with ivfs, antiemetics, pain medications D/c IV fluids, tolerating clears, advance diet as able General surgery following   SIRS d/t pancreatitis Tachycardic, tachypneic, leukocytosis Unlikely sepsis  Abx not indicated in simple pancreatitis [place]   Acute metabolic acidosis - improving ? Due to starvation ketosis/ lactic acidosis  Continue hydration Monitor BMP   Hyponatremia, presumed due to hypovolemia  Improving Closely monitor sodium    Hyperglycemia  no prior hx of DMII check  A1C   Hx of HTN states she has not  been on medications in some time due to loss of insurance Start metoprolol  given tachycardia though tachy may be d/t acute illness/SIRS    HLD not taking medications due to loss of insurance Follow outpatient   ETOH  ciwa    Focal small bowel intussusception in the left mid-abdomen not clinically significant General surgery following   Reactive ileus  Bowel regimen as needed       DVT prophylaxis: heparin  IV fluids: LR continuous IV fluids  Nutrition: CLD advance slowly Central lines /  invasive devices: none  Code Status: FULL CODE ACP documentation reviewed:  none on file in [facility]  TOC needs: TBD, likely to need med assistance / PCP referral  Barriers to dispo / significant pending items: (+)SIRS, advancing diet

## 2023-10-19 NOTE — Progress Notes (Signed)
 10/19/2023  Subjective: No acute events overnight.  This morning the patient reports that her pain has improved and remains sore over upper abdomen.  Denies further nausea.  She just had RUQ ultrasound done.  Vital signs: Temp:  [98 F (36.7 C)-98.9 F (37.2 C)] 98 F (36.7 C) (01/05 0615) Pulse Rate:  [106-140] 121 (01/05 0825) Resp:  [17-36] 17 (01/05 0800) BP: (127-197)/(100-119) 132/106 (01/05 0825) SpO2:  [97 %-100 %] 100 % (01/05 0800) Weight:  [54.4 kg] 54.4 kg (01/04 1301)   Intake/Output: No intake/output data recorded.    Physical Exam: Constitutional:  No acute distress Abdomen:  soft, non-distended, with soreness to palpation over upper abdomen.  No peritonitis.  Labs:  Recent Labs    10/18/23 1827 10/19/23 0615  WBC 15.9* 17.4*  HGB 15.1* 14.0  HCT 41.5 39.3  PLT 287 255   Recent Labs    10/18/23 2340 10/19/23 0615  NA 129* 129*  K 3.7 3.8  CL 95* 92*  CO2 18* 25  GLUCOSE 220* 192*  BUN 23* 15  CREATININE 0.67 0.58  CALCIUM 9.4 9.2   Recent Labs    10/19/23 0615  LABPROT 14.0  INR 1.1    Imaging: US  Abdomen Limited RUQ (LIVER/GB) Result Date: 10/19/2023 CLINICAL DATA:  Pancreatitis EXAM: ULTRASOUND ABDOMEN LIMITED RIGHT UPPER QUADRANT COMPARISON:  CT AP 10/18/2023 FINDINGS: Gallbladder: No gallstones or wall thickening visualized. No sonographic Murphy sign noted by sonographer. Common bile duct: Diameter: 4.5 mm. Liver: No focal lesion identified. Within normal limits in parenchymal echogenicity. Portal vein is patent on color Doppler imaging with normal direction of blood flow towards the liver. Other: Trace perihepatic free fluid. IMPRESSION: 1. No acute findings.  No gallstones identified. 2. Trace perihepatic free fluid. Electronically Signed   By: Waddell Calk M.D.   On: 10/19/2023 09:17   DG Chest Port 1 View Result Date: 10/18/2023 CLINICAL DATA:  Leukocytosis EXAM: PORTABLE CHEST 1 VIEW COMPARISON:  X-ray 06/02/2022. FINDINGS: No  consolidation, pneumothorax or effusion. No edema. Normal cardiopericardial silhouette. Degenerative changes of the spine. Overlapping cardiac leads. IMPRESSION: No acute cardiopulmonary disease. Electronically Signed   By: Ranell Bring M.D.   On: 10/18/2023 18:43   CT ABDOMEN PELVIS W CONTRAST Result Date: 10/18/2023 CLINICAL DATA:  Pancreatitis. EXAM: CT ABDOMEN AND PELVIS WITH CONTRAST TECHNIQUE: Multidetector CT imaging of the abdomen and pelvis was performed using the standard protocol following bolus administration of intravenous contrast. RADIATION DOSE REDUCTION: This exam was performed according to the departmental dose-optimization program which includes automated exposure control, adjustment of the mA and/or kV according to patient size and/or use of iterative reconstruction technique. CONTRAST:  75mL OMNIPAQUE  IOHEXOL  300 MG/ML  SOLN COMPARISON:  01/30/2022. FINDINGS: Lower chest: Lung bases are clear. No pericardial or pleural effusions. Hepatobiliary: No focal liver abnormality is seen. No gallstones, gallbladder wall thickening, or biliary dilatation. Pancreas: Decreased attenuation identified of the mid body and tail of the pancreas adjacent peripancreatic fat stranding. These findings suggest acute pancreatitis. The hypoattenuation in the pancreatic parenchyma could represent a developing pseudocyst, but underlying parenchymal lesions not excluded. This represents an interval change from the prior study. For this reason, follow up is recommended to ensure resolution and exclude underlying parenchymal lesions. No fluid collections to suggest abscess and no evidence of pancreatic hemorrhage. Spleen: Normal in size without focal abnormality. Adrenals/Urinary Tract: Adrenal glands are unremarkable. Kidneys are normal, without renal calculi, focal lesion, or hydronephrosis. Bladder is unremarkable. Stomach/Bowel: Unremarkable appearance of the stomach. No  bowel dilatation to suggest obstruction. Note  is made of a focal small bowel intussusception in the left midabdomen. No lesion is identified to explain presence of intussusception. Follow up suggested. Loops of small bowel are fluid-filled and mildly dilated in the mid to lower abdomen likely reactive ileus. Normal stool and air in the colon and rectosigmoid. Appendix is not seen and there is no suggestion of appendicitis. Vascular/Lymphatic: Aortic atherosclerosis. No enlarged abdominal or pelvic lymph nodes. Reproductive: Uterus and bilateral adnexa are unremarkable. Other: No abdominal wall hernia or abnormality. No abdominopelvic ascites. Musculoskeletal: Grade 1 L5 retrolisthesis. L4-5 disc space narrowing consistent with degenerative disc disease. IMPRESSION: 1. Findings consistent with acute pancreatitis. Hypoattenuation in the pancreatic parenchyma could represent a developing pseudocyst. Follow up is recommended to ensure resolution and exclude underlying parenchymal lesions. 2. Focal small bowel intussusception in the left midabdomen. Follow up suggested. 3. Probable reactive small bowel ileus. 4. Aortic atherosclerosis (ICD10-I70.0). Electronically Signed   By: Fonda Field M.D.   On: 10/18/2023 17:12    Assessment/Plan: This is a 48 y.o. female with pancreatitis and small bowel intussusception.  --Patient is clinically improving.  Her pain is better.  Lactic acid has normalized.  U/S done this morning does not show any sludge or cholelithiasis, so this episode of pancreatitis is not biliary related, and no surgical intervention is needed from that standpoint. --The intussusception noted on CT scan yesterday likely was incidental as the patient is not having any worsening symptoms.  No clinical evidence of bowel obstruction or ischemia.  No surgical intervention is needed from that standpoint either. --Will start patient on clear liquids and her diet could overall be advanced based on her pancreatitis symptoms.  Surgery team will sign off  but please feel free to reach us  if any questions/concerns.   I spent 35 minutes dedicated to the care of this patient on the date of this encounter to include pre-visit review of records, face-to-face time with the patient discussing diagnosis and management, and any post-visit coordination of care.  Aloysius Sheree Plant, MD [facility]

## 2023-10-19 NOTE — Progress Notes (Signed)
 PROGRESS NOTE    Danielle Harrison   FMW:[id number] DOB: 20-May-1976  DOA: 10/18/2023 Date of Service: 10/19/23 which is hospital day 1  PCP: [facility]    [facility] course / significant events:   HPI: Danielle Harrison is a 48 y.o. female with medical history Anxiety, Asthma, HLD, HTN restless leg syndrome, etoh abuse, hx of etoh pancreatitis who presents to ED with n/v/ severe abdominal pain x 48 hours.  She notes  pain mid to lower abdomen and radiates towards her back. She note no fever /chills/ chest pain or sob. Patient also noted normal bowel movements, no diarrhea or constipation. Notes n/v improved s/p treatment in ED. Currently pain is a 8/10. Patient states  she  last drank 4 days ago. She denies any history of severe withdrawal  01/04: admitted to hospitalist service, general surgery consulted  01/05: remains (+)SIRS. No procedures per gen surg.      Consultants:  General surgery   Procedures/Surgeries: none      ASSESSMENT & PLAN:   Acute ETOH pancreatitis  Pancreatic pseudocyst supportive care with ivfs, antiemetics, pain medications D/c IV fluids, tolerating clears, advance diet as able General surgery following   SIRS d/t pancreatitis Tachycardic, tachypneic, leukocytosis Unlikely sepsis  Abx not indicated in simple pancreatitis Montior   Acute metabolic acidosis - improving ? Due to starvation ketosis/ lactic acidosis  Continue hydration Monitor BMP   Hyponatremia, presumed due to hypovolemia  Improving Closely monitor sodium    Hyperglycemia  no prior hx of DMII check  A1C   Hx of HTN states she has not  been on medications in some time due to loss of insurance Start metoprolol  given tachycardia though tachy may be d/t acute illness/SIRS    HLD not taking medications due to loss of insurance Follow outpatient   ETOH  ciwa    Focal small bowel intussusception in the left mid-abdomen not clinically significant General surgery  following   Reactive ileus  Bowel regimen as needed       DVT prophylaxis: heparin  IV fluids: LR continuous IV fluids  Nutrition: CLD advance slowly Central lines / invasive devices: none  Code Status: FULL CODE ACP documentation reviewed:  none on file in [facility]  TOC needs: TBD, likely to need med assistance / PCP referral  Barriers to dispo / significant pending items: (+)SIRS, advancing diet              Subjective / Brief ROS:  Patient reports sore abdomen but not as bad as yesterday  Denies CP/SOB.  Pain controlled.  Denies new weakness.  Tolerating clears.  Reports no concerns w/ urination/defecation.   Family Communication: none at this time    Objective Findings:  Vitals:   10/19/23 1230 10/19/23 1445 10/19/23 1457 10/19/23 1458  BP: (!) 138/97 (!) 142/103  (!) 142/103  Pulse: (!) 123 (!) 119  (!) 119  Resp: 20 (!) 29    Temp:   98.9 F (37.2 C)   TempSrc:   Oral   SpO2: 98% 100%    Weight:      Height:       No intake or output data in the 24 hours ending 10/19/23 1643 Filed Weights   10/18/23 1301  Weight: 54.4 kg    Examination:  Physical Exam Constitutional:      General: She is not in acute distress. Cardiovascular:     Rate and Rhythm: Regular rhythm. Tachycardia present.  Abdominal:  General: Bowel sounds are normal.     Tenderness: There is generalized abdominal tenderness. There is no guarding or rebound.  Skin:    General: Skin is warm and dry.  Neurological:     General: No focal deficit present.     Mental Status: She is alert and oriented to person, place, and time.  Psychiatric:        Mood and Affect: Mood normal.        Behavior: Behavior normal.          Scheduled Medications:   folic acid   1 mg Oral Daily   guaiFENesin   600 mg Oral BID   heparin   5,000 Units Subcutaneous Q8H   metoprolol  succinate  25 mg Oral Daily   multivitamin with minerals  1 tablet Oral Daily   thiamine   100 mg Oral Daily    Or   thiamine   100 mg Intravenous Daily    Continuous Infusions:  lactated ringers  Stopped (10/19/23 1504)    PRN Medications:  acetaminophen  **OR** acetaminophen , albuterol , HYDROmorphone  (DILAUDID ) injection, LORazepam  **OR** LORazepam , ondansetron  **OR** ondansetron  (ZOFRAN ) IV, oxyCODONE   Antimicrobials from admission:  Anti-infectives (From admission, onward)    None           Data Reviewed:  I have personally reviewed the following...  CBC: Recent Labs  Lab 10/18/23 1304 10/18/23 1827 10/19/23 0615  WBC 19.5* 15.9* 17.4*  HGB 16.1* 15.1* 14.0  HCT 44.3 41.5 39.3  MCV 95.7 94.7 96.8  PLT 401* 287 255   Basic Metabolic Panel: Recent Labs  Lab 10/18/23 1304 10/18/23 1827 10/18/23 2340 10/19/23 0615  NA 123*  --  129* 129*  K 3.4*  --  3.7 3.8  CL 87*  --  95* 92*  CO2 17*  --  18* 25  GLUCOSE 299*  --  220* 192*  BUN 33*  --  23* 15  CREATININE 0.98 0.72 0.67 0.58  CALCIUM 10.2  --  9.4 9.2  MG  --   --  2.7*  --   PHOS  --  3.6  --   --    GFR: Estimated Creatinine Clearance: 62.4 mL/min (by C-G formula based on SCr of 0.58 mg/dL). Liver Function Tests: Recent Labs  Lab 10/18/23 1304 10/19/23 0615  AST 37 27  ALT 16 13  ALKPHOS 93 79  BILITOT 1.9* 1.4*  PROT 8.9* 7.2  ALBUMIN 4.9 3.7   Recent Labs  Lab 10/18/23 1304  LIPASE 554*   No results for input(s): AMMONIA in the last 168 hours. Coagulation Profile: Recent Labs  Lab 10/19/23 0615  INR 1.1   Cardiac Enzymes: No results for input(s): CKTOTAL, CKMB, CKMBINDEX, TROPONINI in the last 168 hours. BNP (last 3 results) No results for input(s): PROBNP in the last 8760 hours. HbA1C: No results for input(s): HGBA1C in the last 72 hours. CBG: No results for input(s): GLUCAP in the last 168 hours. Lipid Profile: No results for input(s): CHOL, HDL, LDLCALC, TRIG, CHOLHDL, LDLDIRECT in the last 72 hours. Thyroid  Function Tests: Recent Labs     10/18/23 1827  TSH 1.979   Anemia Panel: No results for input(s): VITAMINB12, FOLATE, FERRITIN, TIBC, IRON, RETICCTPCT in the last 72 hours. Most Recent Urinalysis On File:     Component Value Date/Time   COLORURINE YELLOW (A) 10/18/2023 2216   APPEARANCEUR CLEAR (A) 10/18/2023 2216   APPEARANCEUR Clear 03/01/2014 1301   LABSPEC 1.028 10/18/2023 2216   LABSPEC 1.012 03/01/2014 1301   PHURINE 5.0  10/18/2023 2216   GLUCOSEU NEGATIVE 10/18/2023 2216   GLUCOSEU 50 mg/dL 94/80/7984 8698   HGBUR MODERATE (A) 10/18/2023 2216   BILIRUBINUR NEGATIVE 10/18/2023 2216   BILIRUBINUR Negative 03/01/2014 1301   KETONESUR NEGATIVE 10/18/2023 2216   PROTEINUR 30 (A) 10/18/2023 2216   NITRITE NEGATIVE 10/18/2023 2216   LEUKOCYTESUR NEGATIVE 10/18/2023 2216   LEUKOCYTESUR Negative 03/01/2014 1301   Sepsis Labs: @LABRCNTIP (procalcitonin:4,lacticidven:4) Microbiology: No results found for this or any previous visit (from the past 240 hours).    Radiology Studies last 3 days: US  Abdomen Limited RUQ (LIVER/GB) Result Date: 10/19/2023 CLINICAL DATA:  Pancreatitis EXAM: ULTRASOUND ABDOMEN LIMITED RIGHT UPPER QUADRANT COMPARISON:  CT AP 10/18/2023 FINDINGS: Gallbladder: No gallstones or wall thickening visualized. No sonographic Murphy sign noted by sonographer. Common bile duct: Diameter: 4.5 mm. Liver: No focal lesion identified. Within normal limits in parenchymal echogenicity. Portal vein is patent on color Doppler imaging with normal direction of blood flow towards the liver. Other: Trace perihepatic free fluid. IMPRESSION: 1. No acute findings.  No gallstones identified. 2. Trace perihepatic free fluid. Electronically Signed   By: Waddell Calk M.D.   On: 10/19/2023 09:17   DG Chest Port 1 View Result Date: 10/18/2023 CLINICAL DATA:  Leukocytosis EXAM: PORTABLE CHEST 1 VIEW COMPARISON:  X-ray 06/02/2022. FINDINGS: No consolidation, pneumothorax or effusion. No edema. Normal  cardiopericardial silhouette. Degenerative changes of the spine. Overlapping cardiac leads. IMPRESSION: No acute cardiopulmonary disease. Electronically Signed   By: Ranell Bring M.D.   On: 10/18/2023 18:43   CT ABDOMEN PELVIS W CONTRAST Result Date: 10/18/2023 CLINICAL DATA:  Pancreatitis. EXAM: CT ABDOMEN AND PELVIS WITH CONTRAST TECHNIQUE: Multidetector CT imaging of the abdomen and pelvis was performed using the standard protocol following bolus administration of intravenous contrast. RADIATION DOSE REDUCTION: This exam was performed according to the departmental dose-optimization program which includes automated exposure control, adjustment of the mA and/or kV according to patient size and/or use of iterative reconstruction technique. CONTRAST:  75mL OMNIPAQUE  IOHEXOL  300 MG/ML  SOLN COMPARISON:  01/30/2022. FINDINGS: Lower chest: Lung bases are clear. No pericardial or pleural effusions. Hepatobiliary: No focal liver abnormality is seen. No gallstones, gallbladder wall thickening, or biliary dilatation. Pancreas: Decreased attenuation identified of the mid body and tail of the pancreas adjacent peripancreatic fat stranding. These findings suggest acute pancreatitis. The hypoattenuation in the pancreatic parenchyma could represent a developing pseudocyst, but underlying parenchymal lesions not excluded. This represents an interval change from the prior study. For this reason, follow up is recommended to ensure resolution and exclude underlying parenchymal lesions. No fluid collections to suggest abscess and no evidence of pancreatic hemorrhage. Spleen: Normal in size without focal abnormality. Adrenals/Urinary Tract: Adrenal glands are unremarkable. Kidneys are normal, without renal calculi, focal lesion, or hydronephrosis. Bladder is unremarkable. Stomach/Bowel: Unremarkable appearance of the stomach. No bowel dilatation to suggest obstruction. Note is made of a focal small bowel intussusception in the left  midabdomen. No lesion is identified to explain presence of intussusception. Follow up suggested. Loops of small bowel are fluid-filled and mildly dilated in the mid to lower abdomen likely reactive ileus. Normal stool and air in the colon and rectosigmoid. Appendix is not seen and there is no suggestion of appendicitis. Vascular/Lymphatic: Aortic atherosclerosis. No enlarged abdominal or pelvic lymph nodes. Reproductive: Uterus and bilateral adnexa are unremarkable. Other: No abdominal wall hernia or abnormality. No abdominopelvic ascites. Musculoskeletal: Grade 1 L5 retrolisthesis. L4-5 disc space narrowing consistent with degenerative disc disease. IMPRESSION: 1. Findings consistent with acute  pancreatitis. Hypoattenuation in the pancreatic parenchyma could represent a developing pseudocyst. Follow up is recommended to ensure resolution and exclude underlying parenchymal lesions. 2. Focal small bowel intussusception in the left midabdomen. Follow up suggested. 3. Probable reactive small bowel ileus. 4. Aortic atherosclerosis (ICD10-I70.0). Electronically Signed   By: Fonda Field M.D.   On: 10/18/2023 17:12           Brayton Baumgartner, DO [facility] 10/19/2023, 4:43 PM    Dictation software may have been used to generate the above note. Typos may occur and escape review in typed/dictated notes. Please contact Dr Marsa directly for clarity if needed.  Staff may message me via secure chat in Epic  but this may not receive an immediate response,  please page me for urgent matters!  If 7PM-7AM, please contact night coverage [email or web address]

## 2023-10-20 DIAGNOSIS — K859 Acute pancreatitis without necrosis or infection, unspecified: Secondary | ICD-10-CM | POA: Diagnosis not present

## 2023-10-20 LAB — BASIC METABOLIC PANEL
Anion gap: 14 (ref 5–15)
BUN: 13 mg/dL (ref 6–20)
CO2: 23 mmol/L (ref 22–32)
Calcium: 9.3 mg/dL (ref 8.9–10.3)
Chloride: 95 mmol/L — ABNORMAL LOW (ref 98–111)
Creatinine, Ser: 0.64 mg/dL (ref 0.44–1.00)
GFR, Estimated: >60 mL/min (ref >60)
Glucose, Bld: 164 mg/dL — ABNORMAL HIGH (ref 70–99)
Potassium: 3.6 mmol/L (ref 3.5–5.1)
Sodium: 132 mmol/L — ABNORMAL LOW (ref 135–145)

## 2023-10-20 LAB — CBC
HCT: 37.3 % (ref 36.0–46.0)
Hemoglobin: 12.8 g/dL (ref 12.0–15.0)
MCH: 34.4 pg — ABNORMAL HIGH (ref 26.0–34.0)
MCHC: 34.3 g/dL (ref 30.0–36.0)
MCV: 100.3 fL — ABNORMAL HIGH (ref 80.0–100.0)
Platelets: 238 10*3/uL (ref 150–400)
RBC: 3.72 MIL/uL — ABNORMAL LOW (ref 3.87–5.11)
RDW: 11.9 % (ref 11.5–15.5)
WBC: 12.7 10*3/uL — ABNORMAL HIGH (ref 4.0–10.5)
nRBC: 0 % (ref 0.0–0.2)

## 2023-10-20 LAB — LIPASE, BLOOD: Lipase: 55 U/L — ABNORMAL HIGH (ref 11–51)

## 2023-10-20 LAB — HEMOGLOBIN A1C
Hgb A1c MFr Bld: 6.3 % — ABNORMAL HIGH (ref 4.8–5.6)
Mean Plasma Glucose: 134 mg/dL

## 2023-10-20 MED ORDER — ENOXAPARIN SODIUM 40 MG/0.4ML IJ SOSY
40.0000 mg | PREFILLED_SYRINGE | INTRAMUSCULAR | Status: DC
  Administered 2023-10-21 – 2023-11-03 (×13): 40 mg via SUBCUTANEOUS
  Filled 2023-10-20 (×15): qty 0.4

## 2023-10-20 NOTE — Progress Notes (Signed)
  PROGRESS NOTE    Danielle Harrison  FMW:[id number] DOB: Jul 14, 1976 DOA: 10/18/2023 PCP: [facility]  [id number]/[id number]  LOS: 2 days   Brief hospital course: Danielle Harrison is a 48 y.o. female with medical history Anxiety, Asthma, HLD, HTN restless leg syndrome, etoh abuse, hx of etoh pancreatitis who presents to ED with n/v/ severe abdominal pain x 48 hours.  She notes  pain mid to lower abdomen and radiates towards her back. She note no fever /chills/ chest pain or sob. Patient also noted normal bowel movements, no diarrhea or constipation. Notes n/v improved s/p treatment in ED. Currently pain is a 8/10. Patient states  she  last drank 4 days ago. She denies any history of severe withdrawal   01/04: admitted to hospitalist service, general surgery consulted  01/05: remains (+)SIRS. No procedures per gen surg.   Assessment & Plan: Acute ETOH pancreatitis  Pancreatic pseudocyst --s/p IVF --advanced to full liquid --oral pain med   SIRS d/t pancreatitis Sepsis ruled out --Tachycardic, tachypneic, leukocytosis Abx not indicated in simple pancreatitis Montior    Acute metabolic acidosis  Lactic acidosis --both resolved   Hyponatremia, presumed due to hypovolemia  --improved with IVF   Hyperglycemia  Prediabetes --A1c 6.3   Hx of HTN --cont Toprol  (new)   HLD not taking medications due to loss of insurance Follow outpatient   ETOH  ciwa    Focal small bowel intussusception in the left mid-abdomen  --Per [place], likely incidental, No clinical evidence of bowel obstruction or ischemia. No surgical intervention is needed from that standpoint either.   Reactive ileus  Bowel regimen as needed     DVT prophylaxis: Lovenox  SQ Code Status: Full code  Family Communication:  Level of care: Telemetry Medical Dispo:   The patient is from: home Anticipated d/c is to: home Anticipated d/c date is: likely tomorrow   Subjective and Interval History:  Abdominal pain  improved.  Tolerating clear liquid diet.  No N/V.   Objective: Vitals:   10/20/23 1100 10/20/23 1200 10/20/23 1324 10/20/23 1754  BP: (!) 130/94 (!) 133/91  136/87  Pulse: 95 86  (!) 101  Resp: (!) 26 (!) 22  20  Temp:   98.3 F (36.8 C)   TempSrc:   Axillary   SpO2: 100% 100%  100%  Weight:      Height:       No intake or output data in the 24 hours ending 10/20/23 1804 Filed Weights   10/18/23 1301  Weight: 54.4 kg    Examination:   Constitutional: NAD, AAOx3 HEENT: conjunctivae and lids normal, EOMI CV: No cyanosis.   RESP: normal respiratory effort, on RA Neuro: II - XII grossly intact.   Psych: Normal mood and affect.  Appropriate judgement and reason   Data Reviewed: I have personally reviewed labs and imaging studies  Time spent: 50 minutes  Danielle Haber, MD [facility] If 7PM-7AM, please contact night-coverage 10/20/2023, 6:04 PM

## 2023-10-21 DIAGNOSIS — K859 Acute pancreatitis without necrosis or infection, unspecified: Secondary | ICD-10-CM | POA: Diagnosis not present

## 2023-10-21 LAB — BASIC METABOLIC PANEL
Anion gap: 13 (ref 5–15)
BUN: 9 mg/dL (ref 6–20)
CO2: 20 mmol/L — ABNORMAL LOW (ref 22–32)
Calcium: 9.1 mg/dL (ref 8.9–10.3)
Chloride: 100 mmol/L (ref 98–111)
Creatinine, Ser: 0.54 mg/dL (ref 0.44–1.00)
GFR, Estimated: >60 mL/min (ref >60)
Glucose, Bld: 158 mg/dL — ABNORMAL HIGH (ref 70–99)
Potassium: 4 mmol/L (ref 3.5–5.1)
Sodium: 133 mmol/L — ABNORMAL LOW (ref 135–145)

## 2023-10-21 LAB — CBC
HCT: 35.5 % — ABNORMAL LOW (ref 36.0–46.0)
Hemoglobin: 12.1 g/dL (ref 12.0–15.0)
MCH: 34 pg (ref 26.0–34.0)
MCHC: 34.1 g/dL (ref 30.0–36.0)
MCV: 99.7 fL (ref 80.0–100.0)
Platelets: 334 K/uL (ref 150–400)
RBC: 3.56 MIL/uL — ABNORMAL LOW (ref 3.87–5.11)
RDW: 11.7 % (ref 11.5–15.5)
WBC: 11.1 K/uL — ABNORMAL HIGH (ref 4.0–10.5)
nRBC: 0 % (ref 0.0–0.2)

## 2023-10-21 LAB — MAGNESIUM: Magnesium: 2.3 mg/dL (ref 1.7–2.4)

## 2023-10-21 MED ORDER — OXYCODONE HCL 5 MG PO TABS
5.0000 mg | ORAL_TABLET | Freq: Four times a day (QID) | ORAL | Status: DC | PRN
Start: 2023-10-21 — End: 2023-10-22
  Administered 2023-10-21 – 2023-10-22 (×6): 5 mg via ORAL
  Filled 2023-10-21 (×6): qty 1

## 2023-10-21 MED ORDER — POLYETHYLENE GLYCOL 3350 17 G PO PACK
34.0000 g | PACK | Freq: Two times a day (BID) | ORAL | Status: DC
  Administered 2023-10-21: 34 g via ORAL
  Filled 2023-10-21: qty 2

## 2023-10-21 MED ORDER — POLYETHYLENE GLYCOL 3350 17 G PO PACK
34.0000 g | PACK | ORAL | Status: AC
  Administered 2023-10-21 (×5): 34 g via ORAL
  Filled 2023-10-21 (×5): qty 2

## 2023-10-21 NOTE — ED Notes (Signed)
 Patient has been getting Tylenol and Oxycodone together as needed.  Pt requested medications together for better pain control

## 2023-10-21 NOTE — ED Notes (Signed)
 ED TO INPATIENT HANDOFF REPORT  ED Nurse Name and Phone #: [phone]  S Name/Age/Gender Danielle Harrison 48 y.o. female Room/Bed: ED31A/ED31A  Code Status   Code Status: Full Code  Home/SNF/Other Home Patient oriented to: self, place, time, and situation Is this baseline? Yes   Triage Complete: Triage complete  Chief Complaint Acute pancreatitis [K85.90] Pancreatitis [K85.90]  Triage Note Pt presents with ABD pain for 2 days, pt states N/V, pt states HX of pancreatitis, pt states she does drink. Pt states it feels like her pancreatitis. NAD noted.    Allergies Allergies  Allergen Reactions   Diclofenac Hives    Level of Care/Admitting Diagnosis ED Disposition     ED Disposition  Admit   Condition  --   Comment  Hospital Area: [facility] [[id number]]  Level of Care: Telemetry Medical [104]  Covid Evaluation: Asymptomatic - no recent exposure (last 10 days) testing not required  Diagnosis: Pancreatitis [797336]  Admitting Physician: ALEXANDER, NATALIE [[id number]]  Attending Physician: ALEXANDER, NATALIE [phone]  Certification:: I certify this patient will need inpatient services for at least 2 midnights          B Medical/Surgery History Past Medical History:  Diagnosis Date   Anxiety    Arthritis    back   Asthma    Neuromuscular disorder (HCC)    weakness and numbness    Restless leg syndrome    Past Surgical History:  Procedure Laterality Date   BIOPSY  06/28/2019   Procedure: BIOPSY;  Surgeon: Janalyn Keene NOVAK, MD;  Location: [facility];  Service: Endoscopy;;   CESAREAN SECTION     x2   COLONOSCOPY WITH PROPOFOL  N/A 06/28/2019   Procedure: COLONOSCOPY WITH PROPOFOL ;  Surgeon: Janalyn Keene NOVAK, MD;  Location: [facility];  Service: Endoscopy;  Laterality: N/A;   ESOPHAGOGASTRODUODENOSCOPY (EGD) WITH PROPOFOL  N/A 06/28/2019   Procedure: ESOPHAGOGASTRODUODENOSCOPY (EGD) WITH PROPOFOL ;  Surgeon: Janalyn Keene NOVAK, MD;  Location: [facility];  Service: Endoscopy;  Laterality: N/A;     A IV Location/Drains/Wounds Patient Lines/Drains/Airways Status     Active Line/Drains/Airways     None            Intake/Output Last 24 hours No intake or output data in the 24 hours ending 10/21/23 0720  Labs/Imaging Results for orders placed or performed during the hospital encounter of 10/18/23 (from the past 48 hours)  Sodium     Status: Abnormal   Collection Time: 10/19/23  4:40 PM  Result Value Ref Range   Sodium 131 (L) 135 - 145 mmol/L    Comment: Performed at [facility], [address] [place], [place] [address]  CBC     Status: Abnormal   Collection Time: 10/20/23  5:07 AM  Result Value Ref Range   WBC 12.7 (H) 4.0 - 10.5 K/uL   RBC 3.72 (L) 3.87 - 5.11 MIL/uL   Hemoglobin 12.8 12.0 - 15.0 g/dL   HCT 62.6 63.9 - 53.9 %   MCV 100.3 (H) 80.0 - 100.0 fL   MCH 34.4 (H) 26.0 - 34.0 pg   MCHC 34.3 30.0 - 36.0 g/dL   RDW 88.0 88.4 - 84.4 %   Platelets 238 150 - 400 K/uL   nRBC 0.0 0.0 - 0.2 %    Comment: Performed at [facility], [address] [place], [place] [address]  Lipase, blood     Status: Abnormal   Collection Time: 10/20/23  5:07 AM  Result  Value Ref Range   Lipase 55 (H) 11 - 51 U/L    Comment: Performed at [facility], [address] [place], [place] [address]  Basic metabolic panel     Status: Abnormal   Collection Time: 10/20/23  5:07 AM  Result Value Ref Range   Sodium 132 (L) 135 - 145 mmol/L   Potassium 3.6 3.5 - 5.1 mmol/L    Comment: HEMOLYSIS AT THIS LEVEL MAY AFFECT RESULT   Chloride 95 (L) 98 - 111 mmol/L   CO2 23 22 - 32 mmol/L   Glucose, Bld 164 (H) 70 - 99 mg/dL    Comment: Glucose reference range applies only to samples taken after fasting for at least 8 hours.   BUN 13 6 - 20 mg/dL   Creatinine, Ser 9.35 0.44 - 1.00 mg/dL   Calcium 9.3 8.9 - 89.6 mg/dL   GFR, Estimated >39 >39 mL/min    Comment:  (NOTE) Calculated using the CKD-EPI Creatinine Equation (2021)    Anion gap 14 5 - 15    Comment: Performed at [facility], [address] [place], [place] [address]   US  Abdomen Limited RUQ (LIVER/GB) Result Date: 10/19/2023 CLINICAL DATA:  Pancreatitis EXAM: ULTRASOUND ABDOMEN LIMITED RIGHT UPPER QUADRANT COMPARISON:  CT AP 10/18/2023 FINDINGS: Gallbladder: No gallstones or wall thickening visualized. No sonographic Murphy sign noted by sonographer. Common bile duct: Diameter: 4.5 mm. Liver: No focal lesion identified. Within normal limits in parenchymal echogenicity. Portal vein is patent on color Doppler imaging with normal direction of blood flow towards the liver. Other: Trace perihepatic free fluid. IMPRESSION: 1. No acute findings.  No gallstones identified. 2. Trace perihepatic free fluid. Electronically Signed   By: Waddell Calk M.D.   On: 10/19/2023 09:17    Pending Labs Unresulted Labs (From admission, onward)     Start     Ordered   10/21/23 0500  Basic metabolic panel  Daily,   R      10/20/23 1449   10/21/23 0500  CBC  Daily,   R      10/20/23 1449   10/21/23 0500  Magnesium   Daily,   R      10/20/23 1449            Vitals/Pain Today's Vitals   10/21/23 0253 10/21/23 0254 10/21/23 0653 10/21/23 0655  BP:    (!) 132/99  Pulse:    77  Resp:    20  Temp: 98.3 F (36.8 C)  98.4 F (36.9 C)   TempSrc: Oral  Oral   SpO2:    94%  Weight:      Height:      PainSc:  8  6      Isolation Precautions No active isolations  Medications Medications  LORazepam  (ATIVAN ) tablet 1-4 mg ( Oral See Alternative 10/18/23 2234)    Or  LORazepam  (ATIVAN ) injection 1-4 mg (2 mg Intravenous Given 10/18/23 2234)  thiamine  (VITAMIN B1) tablet 100 mg (100 mg Oral Given 10/20/23 0931)    Or  thiamine  (VITAMIN B1) injection 100 mg ( Intravenous See Alternative 10/20/23 0931)  folic acid  (FOLVITE ) tablet 1 mg (1 mg Oral Given 10/20/23 0931)  multivitamin with minerals tablet 1  tablet (1 tablet Oral Given 10/20/23 0931)  lactated ringers  infusion (0 mLs Intravenous Stopped 10/19/23 1504)  acetaminophen  (TYLENOL ) tablet 650 mg (650 mg Oral Given 10/21/23 0254)    Or  acetaminophen  (TYLENOL ) suppository 650 mg ( Rectal See Alternative 10/21/23 0254)  oxyCODONE  (Oxy IR/ROXICODONE ) immediate release tablet 5 mg (5 mg Oral Given 10/21/23 0254)  ondansetron  (ZOFRAN ) tablet 4 mg (has no administration in time range)    Or  ondansetron  (ZOFRAN ) injection 4 mg (has no administration in time range)  albuterol  (PROVENTIL ) (2.5 MG/3ML) 0.083% nebulizer solution 2.5 mg (has no administration in time range)  guaiFENesin  (MUCINEX ) 12 hr tablet 600 mg (600 mg Oral Given 10/20/23 2224)  metoprolol  succinate (TOPROL -XL) 24 hr tablet 25 mg (25 mg Oral Given 10/20/23 0931)  enoxaparin  (LOVENOX ) injection 40 mg (40 mg Subcutaneous Not Given 10/20/23 1826)  sodium chloride  0.9 % bolus 1,000 mL (0 mLs Intravenous Stopped 10/18/23 1752)  morphine  (PF) 4 MG/ML injection 4 mg (4 mg Intravenous Given 10/18/23 1611)  ondansetron  (ZOFRAN ) injection 4 mg (4 mg Intravenous Given 10/18/23 1611)  iohexol  (OMNIPAQUE ) 300 MG/ML solution 75 mL (75 mLs Intravenous Contrast Given 10/18/23 1628)  fentaNYL  (SUBLIMAZE ) injection 75 mcg (75 mcg Intravenous Given 10/18/23 1746)  magnesium  sulfate IVPB 2 g 50 mL (0 g Intravenous Stopped 10/18/23 1942)    Mobility walks     Focused Assessments Intermittent abdominal pain.  No report of nausea or vomiting.  Patient ambulates without assistance.  Has been able to tolerate PO intake.   R Recommendations: See Admitting Provider Note  Report given to:   Additional Notes: .

## 2023-10-21 NOTE — Plan of Care (Signed)
  Problem: Coping: Goal: Ability to adjust to condition or change in health will improve Outcome: Progressing   Problem: Nutritional: Goal: Maintenance of adequate nutrition will improve Outcome: Progressing   Problem: Skin Integrity: Goal: Risk for impaired skin integrity will decrease Outcome: Progressing   Problem: Tissue Perfusion: Goal: Adequacy of tissue perfusion will improve Outcome: Progressing   Problem: Education: Goal: Knowledge of General Education information will improve Description: Including pain rating scale, medication(s)/side effects and non-pharmacologic comfort measures Outcome: Progressing   Problem: Clinical Measurements: Goal: Respiratory complications will improve Outcome: Progressing Goal: Cardiovascular complication will be avoided Outcome: Progressing   Problem: Activity: Goal: Risk for activity intolerance will decrease Outcome: Progressing   Problem: Nutrition: Goal: Adequate nutrition will be maintained Outcome: Progressing   Problem: Elimination: Goal: Will not experience complications related to urinary retention Outcome: Progressing   Problem: Pain Management: Goal: General experience of comfort will improve Outcome: Progressing   Problem: Safety: Goal: Ability to remain free from injury will improve Outcome: Progressing   Problem: Skin Integrity: Goal: Risk for impaired skin integrity will decrease Outcome: Progressing

## 2023-10-21 NOTE — Progress Notes (Signed)
  PROGRESS NOTE    Danielle Harrison  FMW:[id number] DOB: [birth year] DOA: 10/18/2023 PCP: [facility]  230A/230A-AA  LOS: 3 days   Brief hospital course: Danielle Harrison is a 48 y.o. female with medical history Anxiety, Asthma, HLD, HTN restless leg syndrome, etoh abuse, hx of etoh pancreatitis who presents to ED with n/v/ severe abdominal pain x 48 hours.  She notes  pain mid to lower abdomen and radiates towards her back. She note no fever /chills/ chest pain or sob. Patient also noted normal bowel movements, no diarrhea or constipation. Notes n/v improved s/p treatment in ED. Currently pain is a 8/10. Patient states  she  last drank 4 days ago. She denies any history of severe withdrawal   01/04: admitted to hospitalist service, general surgery consulted  01/05: remains (+)SIRS. No procedures per gen surg.   Assessment & Plan: Acute ETOH pancreatitis  Pancreatic pseudocyst --s/p IVF --cont full liquid --oral pain med   SIRS d/t pancreatitis Sepsis ruled out --Tachycardic, tachypneic, leukocytosis Abx not indicated in simple pancreatitis Montior    Acute metabolic acidosis  Lactic acidosis --both resolved   Hyponatremia, presumed due to hypovolemia  --improved with IVF --oral hydration    Hyperglycemia  Prediabetes --A1c 6.3   Hx of HTN --cont Toprol  (new)   HLD not taking medications due to loss of insurance Follow outpatient   ETOH  --no signs of withdrawal   Focal small bowel intussusception in the left mid-abdomen  --Per G[place]urg, likely incidental, No clinical evidence of bowel obstruction or ischemia. No surgical intervention is needed from that standpoint either.   Reactive ileus and constipation --aggressive Miralax     DVT prophylaxis: Lovenox  SQ Code Status: Full code  Family Communication:  Level of care: Telemetry Medical Dispo:   The patient is from: home Anticipated d/c is to: home Anticipated d/c date is: likely  tomorrow   Subjective and Interval History:  Complained of being constipated.  Still having abdominal soreness.   Objective: Vitals:   10/21/23 0653 10/21/23 0655 10/21/23 0815 10/21/23 1520  BP:  (!) 132/99 (!) 127/94 119/75  Pulse:  77  (!) 110  Resp:  20 15   Temp: 98.4 F (36.9 C)  98.2 F (36.8 C) 99.7 F (37.6 C)  TempSrc: Oral  Oral   SpO2:  94% 95% 99%  Weight:      Height:        Intake/Output Summary (Last 24 hours) at 10/21/2023 1856 Last data filed at 10/21/2023 1800 Gross per 24 hour  Intake --  Output 1 ml  Net -1 ml   Filed Weights   10/18/23 1301  Weight: 54.4 kg    Examination:   Constitutional: NAD, AAOx3 HEENT: conjunctivae and lids normal, EOMI CV: No cyanosis.   RESP: normal respiratory effort, on RA Neuro: II - XII grossly intact.   Psych: Normal mood and affect.  Appropriate judgement and reason   Data Reviewed: I have personally reviewed labs and imaging studies  Time spent: 35 minutes  Danielle Haber, MD [facility] If 7PM-7AM, please contact night-coverage 10/21/2023, 6:56 PM

## 2023-10-22 ENCOUNTER — Inpatient Hospital Stay: Payer: Medicaid Other

## 2023-10-22 DIAGNOSIS — K859 Acute pancreatitis without necrosis or infection, unspecified: Secondary | ICD-10-CM | POA: Diagnosis not present

## 2023-10-22 LAB — CBC
HCT: 34.3 % — ABNORMAL LOW (ref 36.0–46.0)
Hemoglobin: 11.7 g/dL — ABNORMAL LOW (ref 12.0–15.0)
MCH: 34.2 pg — ABNORMAL HIGH (ref 26.0–34.0)
MCHC: 34.1 g/dL (ref 30.0–36.0)
MCV: 100.3 fL — ABNORMAL HIGH (ref 80.0–100.0)
Platelets: 260 10*3/uL (ref 150–400)
RBC: 3.42 MIL/uL — ABNORMAL LOW (ref 3.87–5.11)
RDW: 12 % (ref 11.5–15.5)
WBC: 5.5 10*3/uL (ref 4.0–10.5)
nRBC: 0 % (ref 0.0–0.2)

## 2023-10-22 LAB — BASIC METABOLIC PANEL
Anion gap: 13 (ref 5–15)
BUN: 9 mg/dL (ref 6–20)
CO2: 25 mmol/L (ref 22–32)
Calcium: 9.6 mg/dL (ref 8.9–10.3)
Chloride: 91 mmol/L — ABNORMAL LOW (ref 98–111)
Creatinine, Ser: 0.59 mg/dL (ref 0.44–1.00)
GFR, Estimated: >60 mL/min (ref >60)
Glucose, Bld: 211 mg/dL — ABNORMAL HIGH (ref 70–99)
Potassium: 4.4 mmol/L (ref 3.5–5.1)
Sodium: 129 mmol/L — ABNORMAL LOW (ref 135–145)

## 2023-10-22 LAB — LACTIC ACID, PLASMA: Lactic Acid, Venous: 1.1 mmol/L (ref 0.5–1.9)

## 2023-10-22 LAB — MAGNESIUM: Magnesium: 2.2 mg/dL (ref 1.7–2.4)

## 2023-10-22 MED ORDER — METHOCARBAMOL 500 MG PO TABS
750.0000 mg | ORAL_TABLET | Freq: Once | ORAL | Status: AC
  Administered 2023-10-22: 750 mg via ORAL
  Filled 2023-10-22: qty 2

## 2023-10-22 MED ORDER — SODIUM CHLORIDE 0.9 % IV SOLN
INTRAVENOUS | Status: DC
Start: 2023-10-22 — End: 2023-10-23

## 2023-10-22 MED ORDER — OXYCODONE HCL 5 MG PO TABS
5.0000 mg | ORAL_TABLET | ORAL | Status: DC | PRN
  Administered 2023-10-22 – 2023-10-23 (×5): 10 mg via ORAL
  Administered 2023-10-24: 5 mg via ORAL
  Administered 2023-10-24 – 2023-11-01 (×37): 10 mg via ORAL
  Administered 2023-11-01: 5 mg via ORAL
  Administered 2023-11-01: 10 mg via ORAL
  Administered 2023-11-01: 5 mg via ORAL
  Administered 2023-11-01 – 2023-11-03 (×11): 10 mg via ORAL
  Administered 2023-11-03: 5 mg via ORAL
  Administered 2023-11-04 (×4): 10 mg via ORAL
  Filled 2023-10-22 (×62): qty 2

## 2023-10-22 MED ORDER — MORPHINE SULFATE (PF) 2 MG/ML IV SOLN
2.0000 mg | INTRAVENOUS | Status: DC | PRN
  Administered 2023-10-22 – 2023-10-30 (×9): 2 mg via INTRAVENOUS
  Filled 2023-10-22 (×11): qty 1

## 2023-10-22 MED ORDER — MORPHINE SULFATE (PF) 4 MG/ML IV SOLN
4.0000 mg | Freq: Once | INTRAVENOUS | Status: AC
  Administered 2023-10-22: 4 mg via INTRAVENOUS
  Filled 2023-10-22: qty 1

## 2023-10-22 MED ORDER — IOHEXOL 300 MG/ML  SOLN
30.0000 mL | Freq: Once | INTRAMUSCULAR | Status: AC | PRN
  Administered 2023-10-22: 30 mL via ORAL

## 2023-10-22 MED ORDER — METHOCARBAMOL 1000 MG/10ML IJ SOLN
500.0000 mg | Freq: Once | INTRAMUSCULAR | Status: DC
  Filled 2023-10-22: qty 5

## 2023-10-22 MED ORDER — IOHEXOL 300 MG/ML  SOLN
100.0000 mL | Freq: Once | INTRAMUSCULAR | Status: AC | PRN
  Administered 2023-10-22: 100 mL via INTRAVENOUS

## 2023-10-22 NOTE — Progress Notes (Signed)
   10/21/23 2122  Assess: MEWS Score  Temp 100.1 F (37.8 C)  BP (!) 119/90  MAP (mmHg) 99  Pulse Rate (!) 116  Resp 18  SpO2 100 %  O2 Device Room Air  Assess: MEWS Score  MEWS Temp 0  MEWS Systolic 0  MEWS Pulse 2  MEWS RR 0  MEWS LOC 0  MEWS Score 2  MEWS Score Color Yellow  Assess: if the MEWS score is Yellow or Red  Were vital signs accurate and taken at a resting state? Yes  Does the patient meet 2 or more of the SIRS criteria? No  MEWS guidelines implemented  Yes, yellow  Treat  MEWS Interventions Considered administering scheduled or prn medications/treatments as ordered  Take Vital Signs  Increase Vital Sign Frequency  Yellow: Q2hr x1, continue Q4hrs until patient remains green for 12hrs  Escalate  MEWS: Escalate Yellow: Discuss with charge nurse and consider notifying provider and/or RRT  Notify: Charge Nurse/RN  Name of Charge Nurse/RN Notified Dellar EDISON RN  Provider Notification  Provider Name/Title Darleene, NP  Date Provider Notified 10/21/23  Time Provider Notified 2136  Method of Notification  (secure chat)  Notification Reason Other (Comment) (yellow MEWS)  Provider response No new orders  Date of Provider Response 10/21/23  Time of Provider Response 2139  Assess: SIRS CRITERIA  SIRS Temperature  0  SIRS Respirations  0  SIRS Pulse 1  SIRS WBC 0  SIRS Score Sum  1

## 2023-10-22 NOTE — Progress Notes (Signed)
   10/22/23 2225  Assess: MEWS Score  Temp (!) 101.6 F (38.7 C)  BP 121/84  MAP (mmHg) 95  Pulse Rate (!) 112  Resp 16  SpO2 100 %  O2 Device Room Air  Assess: MEWS Score  MEWS Temp 2  MEWS Systolic 0  MEWS Pulse 2  MEWS RR 0  MEWS LOC 0  MEWS Score 4  MEWS Score Color Red  Assess: if the MEWS score is Yellow or Red  Were vital signs accurate and taken at a resting state? Yes  Does the patient meet 2 or more of the SIRS criteria? Yes  Does the patient have a confirmed or suspected source of infection? No  MEWS guidelines implemented  Yes, red  Treat  MEWS Interventions Considered administering scheduled or prn medications/treatments as ordered  Take Vital Signs  Increase Vital Sign Frequency  Red: Q1hr x2, continue Q4hrs until patient remains green for 12hrs  Escalate  MEWS: Escalate Red: Discuss with charge nurse and notify provider. Consider notifying RRT. If remains red for 2 hours consider need for higher level of care  Notify: Charge Nurse/RN  Name of Charge Nurse/RN Notified Zell MOHR RN  Provider Notification  Provider Name/Title Jawo NP  Date Provider Notified 10/22/23  Time Provider Notified 2237  Method of Notification  (secure chat)  Notification Reason Other (Comment) (red MEWS)  Provider response No new orders  Date of Provider Response 10/22/23  Time of Provider Response 2238  Assess: SIRS CRITERIA  SIRS Temperature  1  SIRS Respirations  0  SIRS Pulse 1  SIRS WBC 0  SIRS Score Sum  2

## 2023-10-22 NOTE — TOC CM/SW Note (Signed)
 Transition of Care [facility]) - Inpatient Brief Assessment   Patient Details  Name: Danielle Harrison MRN: [id number] Date of Birth: 08/16/1976  Transition of Care [facility]) CM/SW Contact:    Corean ONEIDA Haddock, RN Phone Number: 10/22/2023, 12:25 PM   Clinical Narrative:   Transition of Care (TOC) Screening Note   Patient Details  Name: Danielle Harrison Date of Birth: 13-Aug-1976   Transition of Care [facility]) CM/SW Contact:    Corean ONEIDA Haddock, RN Phone Number: 10/22/2023, 12:25 PM    Transition of Care Department [facility]) has reviewed patient and no TOC needs have been identified at this time.  If new patient transition needs arise, please place a TOC consult.    Transition of Care Asessment: Insurance and Status: Insurance coverage has been reviewed Patient has primary care physician: Yes     Prior/Current Home Services: No current home services Social Drivers of Health Review: SDOH reviewed no interventions necessary Readmission risk has been reviewed: Yes Transition of care needs: no transition of care needs at this time

## 2023-10-22 NOTE — Progress Notes (Signed)
  PROGRESS NOTE    Danielle Harrison  FMW:[id number] DOB: 27-May-1976 DOA: 10/18/2023 PCP: [facility]  230A/230A-AA  LOS: 4 days   Brief hospital course: Danielle Harrison is a 48 y.o. female with medical history Anxiety, Asthma, HLD, HTN restless leg syndrome, etoh abuse, hx of etoh pancreatitis who presents to ED with n/v/ severe abdominal pain x 48 hours.  She notes  pain mid to lower abdomen and radiates towards her back. She note no fever /chills/ chest pain or sob. Patient also noted normal bowel movements, no diarrhea or constipation. Notes n/v improved s/p treatment in ED. Currently pain is a 8/10. Patient states  she  last drank 4 days ago. She denies any history of severe withdrawal   01/04: admitted to hospitalist service, general surgery consulted  01/05: remains (+)SIRS. No procedures per gen surg.   Assessment & Plan:  Acute ETOH pancreatitis  Pancreatic pseudocyst --s/p IVF --abdominal pain suddenly became a lot worse today  Plan: --repeat CT a/p --downgrade to clear liquid --resume MIVF  Focal small bowel intussusception in the left mid-abdomen  --Per [place], likely incidental, No clinical evidence of bowel obstruction or ischemia. No surgical intervention is needed from that standpoint either.  --abdominal pain suddenly became a lot worse today  Plan: --repeat CT a/p --lactic acid --oncall GenSurg notified  SIRS d/t pancreatitis Sepsis ruled out --Tachycardic, tachypneic, leukocytosis Abx not indicated in simple pancreatitis Montior    Acute metabolic acidosis  Lactic acidosis --initially resolved after IVF --repeat lactic acid today   Hyponatremia, presumed due to hypovolemia  --improved with IVF --resume MIVF   Hyperglycemia  Prediabetes --A1c 6.3   Hx of HTN --hold Toprol  (new)   HLD not taking medications due to loss of insurance Follow outpatient   ETOH  --no signs of withdrawal   Reactive ileus and constipation --resolved with  aggressive Miralax     DVT prophylaxis: Lovenox  SQ Code Status: Full code  Family Communication:  Level of care: Telemetry Medical Dispo:   The patient is from: home Anticipated d/c is to: home Anticipated d/c date is: to be determined   Subjective and Interval History:  Pt reported 7 BM's since last night, constipation resolved.  Later in the day, pt started having worsening abdominal pain.  Plan to repeat CT a/p.   Objective: Vitals:   10/22/23 0916 10/22/23 1148 10/22/23 1452 10/22/23 1612  BP: 99/65 116/79 (!) 136/95 111/78  Pulse: (!) 126 94 (!) 104 (!) 120  Resp: 18  18 20   Temp: 98.2 F (36.8 C) 97.9 F (36.6 C) 98.3 F (36.8 C) 98.2 F (36.8 C)  TempSrc: Oral Oral Oral Oral  SpO2: 92% 97% 100% 98%  Weight:      Height:        Intake/Output Summary (Last 24 hours) at 10/22/2023 1850 Last data filed at 10/22/2023 1300 Gross per 24 hour  Intake 360 ml  Output --  Net 360 ml   Filed Weights   10/18/23 1301  Weight: 54.4 kg    Examination:   Constitutional: NAD, AAOx3 HEENT: conjunctivae and lids normal, EOMI CV: No cyanosis.   RESP: normal respiratory effort, on RA Neuro: II - XII grossly intact.   Psych: Normal mood and affect.  Appropriate judgement and reason   Data Reviewed: I have personally reviewed labs and imaging studies  Time spent: 50 minutes  Ellouise Haber, MD [facility] If 7PM-7AM, please contact night-coverage 10/22/2023, 6:50 PM

## 2023-10-23 ENCOUNTER — Inpatient Hospital Stay: Payer: Medicaid Other

## 2023-10-23 DIAGNOSIS — K859 Acute pancreatitis without necrosis or infection, unspecified: Secondary | ICD-10-CM | POA: Diagnosis not present

## 2023-10-23 LAB — BASIC METABOLIC PANEL
Anion gap: 12 (ref 5–15)
BUN: 6 mg/dL (ref 6–20)
CO2: 23 mmol/L (ref 22–32)
Calcium: 8.8 mg/dL — ABNORMAL LOW (ref 8.9–10.3)
Chloride: 91 mmol/L — ABNORMAL LOW (ref 98–111)
Creatinine, Ser: 0.62 mg/dL (ref 0.44–1.00)
GFR, Estimated: >60 mL/min (ref >60)
Glucose, Bld: 259 mg/dL — ABNORMAL HIGH (ref 70–99)
Potassium: 4.1 mmol/L (ref 3.5–5.1)
Sodium: 126 mmol/L — ABNORMAL LOW (ref 135–145)

## 2023-10-23 LAB — CBC
HCT: 31.3 % — ABNORMAL LOW (ref 36.0–46.0)
Hemoglobin: 10.6 g/dL — ABNORMAL LOW (ref 12.0–15.0)
MCH: 34.2 pg — ABNORMAL HIGH (ref 26.0–34.0)
MCHC: 33.9 g/dL (ref 30.0–36.0)
MCV: 101 fL — ABNORMAL HIGH (ref 80.0–100.0)
Platelets: 227 10*3/uL (ref 150–400)
RBC: 3.1 MIL/uL — ABNORMAL LOW (ref 3.87–5.11)
RDW: 12.4 % (ref 11.5–15.5)
WBC: 9.3 10*3/uL (ref 4.0–10.5)
nRBC: 0 % (ref 0.0–0.2)

## 2023-10-23 LAB — BLOOD CULTURE ID PANEL (REFLEXED) - BCID2

## 2023-10-23 LAB — MAGNESIUM: Magnesium: 1.7 mg/dL (ref 1.7–2.4)

## 2023-10-23 LAB — URINALYSIS, COMPLETE (UACMP) WITH MICROSCOPIC
Bilirubin Urine: NEGATIVE
Glucose, UA: >=500 mg/dL — AB
Ketones, ur: NEGATIVE mg/dL
Leukocytes,Ua: NEGATIVE
Nitrite: NEGATIVE
Protein, ur: 30 mg/dL — AB
RBC / HPF: 0 RBC/hpf (ref 0–5)
Specific Gravity, Urine: 1.015 (ref 1.005–1.030)
pH: 6 (ref 5.0–8.0)

## 2023-10-23 LAB — PROCALCITONIN: Procalcitonin: 0.88 ng/mL

## 2023-10-23 MED ORDER — PIPERACILLIN-TAZOBACTAM 3.375 G IVPB
3.3750 g | Freq: Three times a day (TID) | INTRAVENOUS | Status: DC
  Administered 2023-10-23 – 2023-10-24 (×3): 3.375 g via INTRAVENOUS
  Filled 2023-10-23 (×3): qty 50

## 2023-10-23 MED ORDER — VANCOMYCIN HCL 1250 MG/250ML IV SOLN
1250.0000 mg | Freq: Once | INTRAVENOUS | Status: AC
  Administered 2023-10-23: 1250 mg via INTRAVENOUS
  Filled 2023-10-23: qty 250

## 2023-10-23 MED ORDER — MORPHINE SULFATE (PF) 4 MG/ML IV SOLN
4.0000 mg | Freq: Once | INTRAVENOUS | Status: AC | PRN
  Administered 2023-10-23: 4 mg via INTRAVENOUS
  Filled 2023-10-23: qty 1

## 2023-10-23 MED ORDER — VANCOMYCIN HCL IN DEXTROSE 1-5 GM/200ML-% IV SOLN: 1000.0000 mg | INTRAVENOUS | Status: DC

## 2023-10-23 MED ORDER — LORAZEPAM 2 MG/ML IJ SOLN
2.0000 mg | Freq: Once | INTRAMUSCULAR | Status: AC | PRN
  Administered 2023-10-23: 2 mg via INTRAVENOUS
  Filled 2023-10-23: qty 1

## 2023-10-23 MED ORDER — METHOCARBAMOL 1000 MG/10ML IJ SOLN
500.0000 mg | Freq: Once | INTRAMUSCULAR | Status: AC
  Administered 2023-10-23: 500 mg via INTRAVENOUS
  Filled 2023-10-23: qty 5

## 2023-10-23 MED ORDER — GADOBUTROL 1 MMOL/ML IV SOLN
5.0000 mL | Freq: Once | INTRAVENOUS | Status: AC | PRN
  Administered 2023-10-23: 5 mL via INTRAVENOUS

## 2023-10-23 MED ORDER — SODIUM CHLORIDE 0.9 % IV SOLN: INTRAVENOUS | Status: AC

## 2023-10-23 NOTE — Progress Notes (Signed)
   10/23/23 1843  Assess: MEWS Score  Temp (!) 102 F (38.9 C)  BP 111/61  MAP (mmHg) 73  Pulse Rate (!) 130  Resp 18  Level of Consciousness Alert  SpO2 99 %  O2 Device Room Air  Assess: MEWS Score  MEWS Temp 2  MEWS Systolic 0  MEWS Pulse 3  MEWS RR 0  MEWS LOC 0  MEWS Score 5  MEWS Score Color Red  Assess: if the MEWS score is Yellow or Red  Were vital signs accurate and taken at a resting state? Yes  Does the patient meet 2 or more of the SIRS criteria? Yes  Does the patient have a confirmed or suspected source of infection? No  MEWS guidelines implemented  Yes, red  Treat  MEWS Interventions Considered administering scheduled or prn medications/treatments as ordered  Take Vital Signs  Increase Vital Sign Frequency  Red: Q1hr x2, continue Q4hrs until patient remains green for 12hrs  Escalate  MEWS: Escalate Red: Discuss with charge nurse and notify provider. Consider notifying RRT. If remains red for 2 hours consider need for higher level of care  Notify: Charge Nurse/RN  Name of Charge Nurse/RN Notified Ale T, RN  Provider Notification  Provider Name/Title Dr. Awanda  Date Provider Notified 10/23/23  Time Provider Notified 1854  Method of Notification Page  Notification Reason Change in status  Assess: SIRS CRITERIA  SIRS Temperature  1  SIRS Respirations  0  SIRS Pulse 1  SIRS WBC 0  SIRS Score Sum  2

## 2023-10-23 NOTE — Plan of Care (Signed)
  Problem: Coping: Goal: Ability to adjust to condition or change in health will improve Outcome: Progressing   Problem: Fluid Volume: Goal: Ability to maintain a balanced intake and output will improve Outcome: Progressing   Problem: Nutritional: Goal: Maintenance of adequate nutrition will improve Outcome: Progressing   Problem: Skin Integrity: Goal: Risk for impaired skin integrity will decrease Outcome: Progressing   Problem: Tissue Perfusion: Goal: Adequacy of tissue perfusion will improve Outcome: Progressing   Problem: Clinical Measurements: Goal: Diagnostic test results will improve Outcome: Progressing Goal: Respiratory complications will improve Outcome: Progressing Goal: Cardiovascular complication will be avoided Outcome: Progressing   Problem: Activity: Goal: Risk for activity intolerance will decrease Outcome: Progressing   Problem: Nutrition: Goal: Adequate nutrition will be maintained Outcome: Progressing   Problem: Elimination: Goal: Will not experience complications related to bowel motility Outcome: Progressing Goal: Will not experience complications related to urinary retention Outcome: Progressing   Problem: Safety: Goal: Ability to remain free from injury will improve Outcome: Progressing

## 2023-10-23 NOTE — Progress Notes (Signed)
 PHARMACY - PHYSICIAN COMMUNICATION CRITICAL VALUE ALERT - BLOOD CULTURE IDENTIFICATION (BCID)  Danielle Harrison is an 48 y.o. female who presented to [facility] on 10/18/2023 with a chief complaint of intra-abdominal pain.  Assessment:  4/4, MRSA (source unclear; suspicion for spinal involvement/source because patient was complaining of severe back pain)  Name of physician (or Provider) Contacted: Dr. Awanda  Current antibiotics: Zosyn   Changes to prescribed antibiotics recommended:  Recommendations accepted by provider. Initiating vancomycin  for MRSA bacteremia. Spinal MRI will be obtained.  Results for orders placed or performed during the hospital encounter of 10/18/23  Blood Culture ID Panel (Reflexed) (Collected: 10/23/2023  9:11 AM)  Result Value Ref Range   Enterococcus faecalis NOT DETECTED NOT DETECTED   Enterococcus Faecium NOT DETECTED NOT DETECTED   Listeria monocytogenes NOT DETECTED NOT DETECTED   Staphylococcus species DETECTED (A) NOT DETECTED   Staphylococcus aureus (BCID) DETECTED (A) NOT DETECTED   Staphylococcus epidermidis NOT DETECTED NOT DETECTED   Staphylococcus lugdunensis NOT DETECTED NOT DETECTED   Streptococcus species NOT DETECTED NOT DETECTED   Streptococcus agalactiae NOT DETECTED NOT DETECTED   Streptococcus pneumoniae NOT DETECTED NOT DETECTED   Streptococcus pyogenes NOT DETECTED NOT DETECTED   A.calcoaceticus-baumannii NOT DETECTED NOT DETECTED   Bacteroides fragilis NOT DETECTED NOT DETECTED   Enterobacterales NOT DETECTED NOT DETECTED   Enterobacter cloacae complex NOT DETECTED NOT DETECTED   Escherichia coli NOT DETECTED NOT DETECTED   Klebsiella aerogenes NOT DETECTED NOT DETECTED   Klebsiella oxytoca NOT DETECTED NOT DETECTED   Klebsiella pneumoniae NOT DETECTED NOT DETECTED   Proteus species NOT DETECTED NOT DETECTED   Salmonella species NOT DETECTED NOT DETECTED   Serratia marcescens NOT DETECTED NOT DETECTED   Haemophilus influenzae NOT  DETECTED NOT DETECTED   Neisseria meningitidis NOT DETECTED NOT DETECTED   Pseudomonas aeruginosa NOT DETECTED NOT DETECTED   Stenotrophomonas maltophilia NOT DETECTED NOT DETECTED   Candida albicans NOT DETECTED NOT DETECTED   Candida auris NOT DETECTED NOT DETECTED   Candida glabrata NOT DETECTED NOT DETECTED   Candida krusei NOT DETECTED NOT DETECTED   Candida parapsilosis NOT DETECTED NOT DETECTED   Candida tropicalis NOT DETECTED NOT DETECTED   Cryptococcus neoformans/gattii NOT DETECTED NOT DETECTED   Meth resistant mecA/C and MREJ DETECTED (A) NOT DETECTED    Will M. Lenon, PharmD Clinical Pharmacist 10/23/2023 8:50 PM

## 2023-10-23 NOTE — Progress Notes (Signed)
 PROGRESS NOTE    Danielle Harrison  FMW:[id number] DOB: 1975/12/23 DOA: 10/18/2023 PCP: [facility]  [id number]  LOS: 5 days   Brief hospital course: Danielle Harrison is a 48 y.o. female with medical history Anxiety, Asthma, HLD, HTN restless leg syndrome, etoh abuse, hx of etoh pancreatitis who presents to ED with n/v/ severe abdominal pain x 48 hours.  She notes  pain mid to lower abdomen and radiates towards her back. She note no fever /chills/ chest pain or sob. Patient also noted normal bowel movements, no diarrhea or constipation. Notes n/v improved s/p treatment in ED. Currently pain is a 8/10. Patient states  she  last drank 4 days ago. She denies any history of severe withdrawal  Assessment & Plan:  Acute ETOH pancreatitis  With developing necrosis --continued to have abdominal pain.  Started having fevers.  Repeat CT a/p showed finding concerning for developing necrosis.  No organized fluid collection. --UA and CXR neg for infection.  Blood cx obtained. Plan: --cont MIVF --cont clear liquid --pain management --start empiric zosyn   Fever and tachycardia --UA and CXR neg for infection.  Blood cx obtained.  Likely due to pancreatic necrosis. --start empiric zosyn  --RVP  Focal small bowel intussusception in the left mid-abdomen  --Per [place], likely incidental, No clinical evidence of bowel obstruction or ischemia. No surgical intervention is needed from that standpoint either.  --repeat CT a/p showed the same. Plan: --Short interval follow up CT is recommended to ensure resolution and exclude a neoplastic lead point.  Severe upper back pain --unclear etiology, but seems MSK (tender to palpation).   Acute metabolic acidosis  Lactic acidosis --resolved after IVF   Hyponatremia, presumed due to hypovolemia  --improved with IVF --cont MIVF   Hyperglycemia  Prediabetes --A1c 6.3   Hx of HTN --hold Toprol  (new)   HLD not taking medications due to loss  of insurance Follow outpatient   ETOH  --no signs of withdrawal   Reactive ileus and constipation --resolved with aggressive Miralax     DVT prophylaxis: Lovenox  SQ Code Status: Full code  Family Communication:  Level of care: Telemetry Medical Dispo:   The patient is from: home Anticipated d/c is to: home Anticipated d/c date is: to be determined   Subjective and Interval History:  Pt complained of severe upper back pain, more so than abdominal pain.  Still having fevers.  After cx obtained, started on empiric zosyn .   Objective: Vitals:   10/23/23 0429 10/23/23 0825 10/23/23 1631 10/23/23 1843  BP: 113/69 128/82 121/65 111/61  Pulse: (!) 103 90 (!) 131 (!) 130  Resp: 18 19 18 18   Temp: 98.9 F (37.2 C) 99.5 F (37.5 C) 98 F (36.7 C) (!) 102 F (38.9 C)  TempSrc: Oral Oral  Oral  SpO2: 100% 98% 98% 99%  Weight:      Height:        Intake/Output Summary (Last 24 hours) at 10/23/2023 1904 Last data filed at 10/23/2023 1429 Gross per 24 hour  Intake 1724.17 ml  Output 100 ml  Net 1624.17 ml   Filed Weights   10/18/23 1301  Weight: 54.4 kg    Examination:   Constitutional: NAD, AAOx3 HEENT: conjunctivae and lids normal, EOMI CV: No cyanosis.   RESP: normal respiratory effort, on RA Neuro: II - XII grossly intact.     Data Reviewed: I have personally reviewed labs and imaging studies  Time spent: 50 minutes  Ellouise Haber, MD [facility] If 7PM-7AM,  please contact night-coverage 10/23/2023, 7:04 PM

## 2023-10-23 NOTE — Plan of Care (Signed)
  Problem: Nutritional: Goal: Maintenance of adequate nutrition will improve Outcome: Progressing   Problem: Pain Management: Goal: General experience of comfort will improve Outcome: Progressing

## 2023-10-23 NOTE — Consult Note (Signed)
 Pharmacy Antibiotic Note  ASSESSMENT: 48 y.o. female with PMH including asthma, EtOH misuse, hx EtOH pancreatitis is presenting with  MRSA bacteremia . 4 out of 4 bottles positive for MRSA.  Febrile as recently as 1843 this evening with T 102. Currently receiving Zosyn  due to acute pancreatitis with concern for early necrosis. Discussed with MD and source is unknown at this point however will obtain an MRI of spine because patient was complaining of severe back pain today. Pharmacy has been consulted to manage vancomycin  dosing.  Patient measurements: Height: 5' (152.4 cm) Weight: 54.4 kg (120 lb) IBW/kg (Calculated) : 45.5  Vital signs: Temp: 102 F (38.9 C) (01/09 1843) Temp Source: Oral (01/09 1843) BP: 111/61 (01/09 1843) Pulse Rate: 130 (01/09 1843) Recent Labs  Lab 10/21/23 0655 10/21/23 0748 10/22/23 1142 10/23/23 0636  WBC  --  11.1* 5.5 9.3  CREATININE 0.54  --  0.59 0.62   Estimated Creatinine Clearance: 62.4 mL/min (by C-G formula based on SCr of 0.62 mg/dL).  Allergies: Allergies  Allergen Reactions   Diclofenac Hives    Antimicrobials this admission: Zosyn  1/9 >>  Vancomycin  1/9 >>  Dose adjustments this admission: N/A  Microbiology results: 1/9 BCx: 4/4, MRSA  PLAN: Administer vancomycin  1250 mg IV x 1 as a loading dose followed by vancomycin  1000 mg IV q24H thereafter eAUC 454, Cmax 34.5, Cmin 9.5 Scr 0.8, IBW, Vd 0.72 L/kg Follow up culture results to assess for antibiotic optimization. Monitor renal function to assess for any necessary antibiotic dosing changes.   Thank you for allowing pharmacy to be a part of this patient's care.  Will M. Lenon, PharmD Clinical Pharmacist 10/23/2023 8:34 PM

## 2023-10-23 NOTE — Progress Notes (Signed)
 Urine specimen sent.

## 2023-10-24 ENCOUNTER — Inpatient Hospital Stay: Payer: Medicaid Other

## 2023-10-24 ENCOUNTER — Inpatient Hospital Stay (HOSPITAL_COMMUNITY)
Admit: 2023-10-24 | Discharge: 2023-10-24 | Disposition: A | Discharge status: Home / Self Care | Payer: Medicaid Other | Attending: Hospitalist

## 2023-10-24 DIAGNOSIS — B9562 Methicillin resistant Staphylococcus aureus infection as the cause of diseases classified elsewhere: Secondary | ICD-10-CM

## 2023-10-24 DIAGNOSIS — M4624 Osteomyelitis of vertebra, thoracic region: Secondary | ICD-10-CM

## 2023-10-24 DIAGNOSIS — R7881 Bacteremia: Secondary | ICD-10-CM

## 2023-10-24 DIAGNOSIS — K859 Acute pancreatitis without necrosis or infection, unspecified: Secondary | ICD-10-CM | POA: Diagnosis not present

## 2023-10-24 LAB — CBC
HCT: 29.4 % — ABNORMAL LOW (ref 36.0–46.0)
Hemoglobin: 10 g/dL — ABNORMAL LOW (ref 12.0–15.0)
MCH: 34.1 pg — ABNORMAL HIGH (ref 26.0–34.0)
MCHC: 34 g/dL (ref 30.0–36.0)
MCV: 100.3 fL — ABNORMAL HIGH (ref 80.0–100.0)
Platelets: 201 10*3/uL (ref 150–400)
RBC: 2.93 MIL/uL — ABNORMAL LOW (ref 3.87–5.11)
RDW: 12.7 % (ref 11.5–15.5)
WBC: 9.4 10*3/uL (ref 4.0–10.5)
nRBC: 0 % (ref 0.0–0.2)

## 2023-10-24 LAB — ECHOCARDIOGRAM COMPLETE
AR max vel: 2.75 cm2
AV Area VTI: 2.64 cm2
AV Area mean vel: 2.48 cm2
AV Mean grad: 5 mm[Hg]
AV Peak grad: 8.3 mm[Hg]
Ao pk vel: 1.44 m/s
Area-P 1/2: 6.12 cm2
Height: 60 in
MV VTI: 2.97 cm2
S' Lateral: 2.8 cm
Weight: 1920 [oz_av]

## 2023-10-24 LAB — BASIC METABOLIC PANEL
Anion gap: 11 (ref 5–15)
BUN: 6 mg/dL (ref 6–20)
CO2: 21 mmol/L — ABNORMAL LOW (ref 22–32)
Calcium: 8.8 mg/dL — ABNORMAL LOW (ref 8.9–10.3)
Chloride: 93 mmol/L — ABNORMAL LOW (ref 98–111)
Creatinine, Ser: 0.74 mg/dL (ref 0.44–1.00)
GFR, Estimated: >60 mL/min (ref >60)
Glucose, Bld: 244 mg/dL — ABNORMAL HIGH (ref 70–99)
Potassium: 3.5 mmol/L (ref 3.5–5.1)
Sodium: 125 mmol/L — ABNORMAL LOW (ref 135–145)

## 2023-10-24 LAB — MAGNESIUM: Magnesium: 1.6 mg/dL — ABNORMAL LOW (ref 1.7–2.4)

## 2023-10-24 MED ORDER — METHOCARBAMOL 500 MG PO TABS
500.0000 mg | ORAL_TABLET | Freq: Once | ORAL | Status: AC
  Administered 2023-10-25: 500 mg via ORAL
  Filled 2023-10-24: qty 1

## 2023-10-24 MED ORDER — ENSURE ENLIVE PO LIQD
237.0000 mL | Freq: Two times a day (BID) | ORAL | Status: DC
  Administered 2023-10-25 – 2023-11-04 (×14): 237 mL via ORAL

## 2023-10-24 MED ORDER — VANCOMYCIN HCL 750 MG/150ML IV SOLN
750.0000 mg | Freq: Two times a day (BID) | INTRAVENOUS | Status: DC
  Administered 2023-10-24 (×2): 750 mg via INTRAVENOUS
  Filled 2023-10-24 (×5): qty 150

## 2023-10-24 NOTE — Plan of Care (Signed)

## 2023-10-24 NOTE — Consult Note (Signed)
 Pharmacy Antibiotic Note  ASSESSMENT: 48 y.o. female with PMH including asthma, EtOH misuse, hx EtOH pancreatitis is presenting with  MRSA bacteremia . 4 out of 4 bottles positive for MRSA.  Fevers started 1/8. Currently receiving Zosyn  due to acute pancreatitis with concern for early necrosis. Discussed with MD and source of bacteremia is unknown at this point however will obtain an MRI of spine because patient was complaining of severe back pain. Pharmacy has been consulted to manage vancomycin  dosing.  Today, 10/24/2023 Day #1 vancomycin   Renal: SCr 0.74 - ? Slight trend up from 1/7 - may not be significant but need to monitor closely.  Possible piperacillin /tazobactam contributing to rise as data suggest it competes for creatine secretion thus increasing SCr levels WBC WNL Tmax 102 1/9 blood cx: 4/4 GPC, S aureus 1/9 MRI - concern for T6-9 discitis   PLAN: Adjust vancomycin  to 750mg  IV q12h based on age and renal function  Goal AUC 400-600 eAUC 577, predicted Cmax 35, Cmin 16.8 Used SCr 0.8, TBW (not IBW) for Ke/CrCl as this calculated CrCl makes more sense for patients age Follow up culture results to assess for antibiotic optimization. Monitor renal function to assess for any necessary antibiotic dosing changes. Daily BMP ordered until 1/12 Recommend checking vancomycin  levels at steady-state  Patient measurements: Height: 5' (152.4 cm) Weight: 54.4 kg (120 lb) IBW/kg (Calculated) : 45.5  Vital signs: Temp: 97.7 F (36.5 C) (01/10 0924) Temp Source: Axillary (01/10 0924) BP: 124/106 (01/10 0924) Pulse Rate: 104 (01/10 0924) Recent Labs  Lab 10/22/23 1142 10/23/23 0636 10/24/23 0530  WBC 5.5 9.3 9.4  CREATININE 0.59 0.62 0.74   Estimated Creatinine Clearance: 62.4 mL/min (by C-G formula based on SCr of 0.74 mg/dL).  Allergies: Allergies  Allergen Reactions   Diclofenac Hives    Antimicrobials this admission: Zosyn  1/9 >>  Vancomycin  1/9 >>  Dose adjustments  this admission: N/A  Microbiology results: 1/9 BCx: 4/4, MRSA  Thank you for allowing pharmacy to be a part of this patient's care.  Lavelle Berland, PharmD, BCPS, BCIDP Work Cell: [phone] 10/24/2023 9:54 AM

## 2023-10-24 NOTE — Consult Note (Signed)
 NAME: Danielle Harrison  DOB: December 27, 1975  MRN: [id number]  Date/Time: 10/24/2023 9:20 AM  REQUESTING PROVIDER: Dr.Wouk Subjective:  REASON FOR CONSULT: MRSA bacteremia ? Danielle Harrison is a 47 y.o. with a history of transverse myelitis diagnosed in 2011 at L1( Received IVMP  1gram X3), last seen [facility] neuro 01/2020 and was referred for [facility] pain management. Now with chronic low back pain, pancreatitis , anxiety, GERD asthma, presented to the ED on 10/18/23 with severe abdominal pain of 2 days duration  10/18/23 13:01  BP 127/100 (H)  Temp 98.5 F (36.9 C)  Pulse Rate 130 !  Resp 18  SpO2 100 %  Weight 120 lb     Latest Reference Range & Units 10/18/23 13:04  WBC 4.0 - 10.5 K/uL 19.5 (H)  Hemoglobin 12.0 - 15.0 g/dL 83.8 (H)  HCT 63.9 - 53.9 % 44.3  Platelets 150 - 400 K/uL 401 (H)  Creatinine 0.44 - 1.00 mg/dL 9.01   Lipase was 445 CT abdomen showed findings consistent with acute pancreatitis , focal small bowel intussusception in the left mid abdomen Surgery saw her for the intussusception but did not think surgical intervention was needed as she was having normal bowel function She developed fever 3 days in the stay and blood culture sent yesterday was MRSA and I am seeing the patient for the same. 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   Past Medical History:  Diagnosis Date   Anxiety    Arthritis    back   Asthma    Neuromuscular disorder (HCC)    weakness and numbness    Restless leg syndrome    Transverse myelitis Past Surgical History:  Procedure Laterality Date   BIOPSY  06/28/2019   Procedure: BIOPSY;  Surgeon:  Janalyn Keene NOVAK, MD;  Location: [facility];  Service: Endoscopy;;   CESAREAN SECTION     x2   COLONOSCOPY WITH PROPOFOL  N/A 06/28/2019   Procedure: COLONOSCOPY WITH PROPOFOL ;  Surgeon: Janalyn Keene NOVAK, MD;  Location: [facility];  Service: Endoscopy;  Laterality: N/A;   ESOPHAGOGASTRODUODENOSCOPY (EGD) WITH PROPOFOL  N/A 06/28/2019   Procedure: ESOPHAGOGASTRODUODENOSCOPY (EGD) WITH PROPOFOL ;  Surgeon: Janalyn Keene NOVAK, MD;  Location: [facility];  Service: Endoscopy;  Laterality: N/A;    Social History   Socioeconomic History   Marital status: Married    Spouse name: Not on file   Number of children: Not on file   Years of education: Not on file   Highest education level: Not on file  Occupational History   Not on file  Tobacco Use   Smoking status: Former    Current packs/day: 0.50    Average packs/day: 0.5 packs/day for 6.0 years (3.0 ttl pk-yrs)    Types: Cigarettes   Smokeless tobacco: Never   Tobacco comments:    Quit 8/22  Substance and Sexual Activity   Alcohol use: Yes    Comment: 3 times per week   Drug use: Never   Sexual activity: Not on file  Other Topics Concern   Not on file  Social History Narrative   Not on file  Social Drivers of Corporate Investment Banker Strain: Not on file  Food Insecurity: No Food Insecurity (10/21/2023)   Hunger Vital Sign    Worried About Running Out of Food in the Last Year: Never true    Ran Out of Food in the Last Year: Never true  Transportation Needs: No Transportation Needs (10/21/2023)   PRAPARE - Administrator, Civil Service (Medical): No    Lack of Transportation (Non-Medical): No  Physical Activity: Not on file  Stress: Not on file  Social Connections: Not on file  Intimate Partner Violence: Not At Risk (10/21/2023)   Humiliation, Afraid, Rape, and Kick questionnaire    Fear of Current or Ex-Partner: No    Emotionally Abused: No    Physically Abused: No    Sexually  Abused: No    Family History  Problem Relation Age of Onset   Colon cancer Neg Hx    Allergies  Allergen Reactions   Diclofenac Hives   I? Current Facility-Administered Medications  Medication Dose Route Frequency Provider Last Rate Last Admin   acetaminophen  (TYLENOL ) tablet 650 mg  650 mg Oral Q6H PRN Thomas, Sara-Maiz A, MD   650 mg at 10/24/23 9344   Or   acetaminophen  (TYLENOL ) suppository 650 mg  650 mg Rectal Q6H PRN Thomas, Sara-Maiz A, MD       albuterol  (PROVENTIL ) (2.5 MG/3ML) 0.083% nebulizer solution 2.5 mg  2.5 mg Nebulization Q2H PRN Thomas, Sara-Maiz A, MD       enoxaparin  (LOVENOX ) injection 40 mg  40 mg Subcutaneous Q24H Awanda City, MD   40 mg at 10/23/23 1846   folic acid  (FOLVITE ) tablet 1 mg  1 mg Oral Daily Thomas, Sara-Maiz A, MD   1 mg at 10/23/23 0750   guaiFENesin  (MUCINEX ) 12 hr tablet 600 mg  600 mg Oral BID Thomas, Sara-Maiz A, MD   600 mg at 10/23/23 2354   morphine  (PF) 2 MG/ML injection 2 mg  2 mg Intravenous Q4H PRN Awanda City, MD   2 mg at 10/24/23 0531   multivitamin with minerals tablet 1 tablet  1 tablet Oral Daily Debby Camila LABOR, MD   1 tablet at 10/23/23 0749   ondansetron  (ZOFRAN ) tablet 4 mg  4 mg Oral Q6H PRN Debby Camila LABOR, MD       Or   ondansetron  (ZOFRAN ) injection 4 mg  4 mg Intravenous Q6H PRN Debby Camila LABOR, MD       oxyCODONE  (Oxy IR/ROXICODONE ) immediate release tablet 5-10 mg  5-10 mg Oral Q4H PRN Awanda City, MD   10 mg at 10/24/23 9344   piperacillin -tazobactam (ZOSYN ) IVPB 3.375 g  3.375 g Intravenous Q8H Lai, Tina, MD 12.5 mL/hr at 10/24/23 0649 3.375 g at 10/24/23 9350   thiamine  (VITAMIN B1) tablet 100 mg  100 mg Oral Daily Thomas, Sara-Maiz A, MD   100 mg at 10/23/23 0750   Or   thiamine  (VITAMIN B1) injection 100 mg  100 mg Intravenous Daily Debby Camila LABOR, MD       vancomycin  (VANCOCIN ) IVPB 1000 mg/200 mL premix  1,000 mg Intravenous Q24H Lenon Elsie HERO, RPH         Abtx:  Anti-infectives (From  admission, onward)    Start     Dose/Rate Route Frequency Ordered Stop   10/24/23 2200  vancomycin  (VANCOCIN ) IVPB 1000 mg/200 mL premix        1,000 mg 200 mL/hr over 60 Minutes Intravenous Every 24 hours 10/23/23 2051  10/23/23 2130  vancomycin  (VANCOREADY) IVPB 1250 mg/250 mL        1,250 mg 166.7 mL/hr over 90 Minutes Intravenous  Once 10/23/23 2033 10/24/23 0116   10/23/23 1400  piperacillin -tazobactam (ZOSYN ) IVPB 3.375 g        3.375 g 12.5 mL/hr over 240 Minutes Intravenous Every 8 hours 10/23/23 1258         REVIEW OF SYSTEMS:  Const:  fever, negative chills, negative weight loss Eyes: negative diplopia or visual changes, negative eye pain ENT: negative coryza, negative sore throat Resp: negative cough, hemoptysis, dyspnea Cards: negative for chest pain, palpitations, lower extremity edema GU: negative for frequency, dysuria and hematuria GI:  abdominal pain, no diarrhea, bleeding, constipation Skin: negative for rash and pruritus Heme: negative for easy bruising and gum/nose bleeding MS:  back pain and  weakness Neurolo:negative for headaches, dizziness, vertigo, memory problems  Psych:  anxiety, depression  Endocrine: negative for thyroid , diabetes Allergy/Immunology-diclofenac Objective:  VITALS:  BP 126/69 (BP Location: Right Wrist)   Pulse (!) 114   Temp 98.6 F (37 C)   Resp 18   Ht 5' (1.524 m)   Wt 54.4 kg   SpO2 93%   BMI 23.44 kg/m   PHYSICAL EXAM:  General: Alert, cooperative,some  distress on moving  appears stated age.  Head: Normocephalic, without obvious abnormality, atraumatic. Eyes: Conjunctivae clear, anicteric sclerae. Pupils are equal ENT Nares normal. No drainage or sinus tenderness. Lips, mucosa, and tongue normal. No Thrush Poor dentition Neck: Supple, symmetrical, no adenopathy, thyroid : non tender no carotid bruit and no JVD. Back: tenderness over mid spine Lungs: b/l air entry- . Heart: Regular rate and rhythm, no murmur,  rub or gallop. Abdomen: Soft, tender epigastrium- some fullness  Bowel sounds normal. No masses Extremities: left great toe MTP swollen, erythematous and tender    Mild tenderness over rt anticubital fossa at the site of previous IV Skin: No rashes or lesions. Or bruising Lymph: Cervical, supraclavicular normal. Neurologic: Grossly non-focal Pertinent Labs Lab Results CBC    Component Value Date/Time   WBC 9.4 10/24/2023 0530   RBC 2.93 (L) 10/24/2023 0530   HGB 10.0 (L) 10/24/2023 0530   HGB 14.2 03/01/2014 1059   HCT 29.4 (L) 10/24/2023 0530   HCT 42.1 03/01/2014 1059   PLT 201 10/24/2023 0530   PLT 324 03/01/2014 1059   MCV 100.3 (H) 10/24/2023 0530   MCV 96 03/01/2014 1059   MCH 34.1 (H) 10/24/2023 0530   MCHC 34.0 10/24/2023 0530   RDW 12.7 10/24/2023 0530   RDW 12.5 03/01/2014 1059   LYMPHSABS 4.2 (H) 06/02/2022 1802   LYMPHSABS 2.0 03/01/2014 1059   MONOABS 0.7 06/02/2022 1802   MONOABS 0.5 03/01/2014 1059   EOSABS 0.4 06/02/2022 1802   EOSABS 0.1 03/01/2014 1059   BASOSABS 0.1 06/02/2022 1802   BASOSABS 0.1 03/01/2014 1059       Latest Ref Rng & Units 10/24/2023    5:30 AM 10/23/2023    6:36 AM 10/22/2023   11:42 AM  CMP  Glucose 70 - 99 mg/dL 755  740  788   BUN 6 - 20 mg/dL 6  6  9    Creatinine 0.44 - 1.00 mg/dL 9.25  9.37  9.40   Sodium 135 - 145 mmol/L 125  126  129   Potassium 3.5 - 5.1 mmol/L 3.5  4.1  4.4   Chloride 98 - 111 mmol/L 93  91  91   CO2 22 - 32 mmol/L  21  23  25    Calcium 8.9 - 10.3 mg/dL 8.8  8.8  9.6       Microbiology: Recent Results (from the past 240 hours)  Culture, blood (Routine X 2) w Reflex to ID Panel     Status: None (Preliminary result)   Collection Time: 10/23/23  9:11 AM   Specimen: BLOOD  Result Value Ref Range Status   Specimen Description   Final    BLOOD LH Performed at [facility], [address] [place], [place] [address]    Special Requests   Final    BOTTLES DRAWN AEROBIC AND ANAEROBIC Blood  Culture adequate volume Performed at [facility], [address] [place], [place] [address]    Culture  Setup Time   Final    GRAM POSITIVE COCCI IN BOTH AEROBIC AND ANAEROBIC BOTTLES CRITICAL RESULT CALLED TO, READ BACK BY AND VERIFIED WITH: WILL ANDERSON @2010  ON 10/23/23 SKL    Culture GRAM POSITIVE COCCI  Final   Report Status PENDING  Incomplete  Culture, blood (Routine X 2) w Reflex to ID Panel     Status: None (Preliminary result)   Collection Time: 10/23/23  9:11 AM   Specimen: BLOOD  Result Value Ref Range Status   Specimen Description   Final    BLOOD RH Performed at [facility], [address] [place], [place] [address]    Special Requests   Final    BOTTLES DRAWN AEROBIC AND ANAEROBIC Blood Culture adequate volume Performed at [facility], [address] [place], [place] [address]    Culture  Setup Time   Final    GRAM POSITIVE COCCI IN BOTH AEROBIC AND ANAEROBIC BOTTLES CRITICAL VALUE NOTED.  VALUE IS CONSISTENT WITH PREVIOUSLY REPORTED AND CALLED VALUE. SKL GRAM STAIN REVIEWED-AGREE WITH RESULT DRT Performed at [facility], [address], [place], [place] [address]    Culture   Final    NO GROWTH < 24 HOURS Performed at [facility], [address] [place], [place] [address]    Report Status PENDING  Incomplete  Blood Culture ID Panel (Reflexed)     Status: Abnormal   Collection Time: 10/23/23  9:11 AM  Result Value Ref Range Status   Enterococcus faecalis NOT DETECTED NOT DETECTED Final   Enterococcus Faecium NOT DETECTED NOT DETECTED Final   Listeria monocytogenes NOT DETECTED NOT DETECTED Final   Staphylococcus species DETECTED (A) NOT DETECTED Final    Comment: CRITICAL RESULT CALLED TO, READ BACK BY AND VERIFIED WITH: WILL ANDERSON @2010  ON 10/23/23 SKL    Staphylococcus aureus (BCID) DETECTED (A) NOT DETECTED Final    Comment: Methicillin (oxacillin)-resistant Staphylococcus aureus (MRSA). MRSA is  predictably resistant to beta-lactam antibiotics (except ceftaroline). Preferred therapy is vancomycin  unless clinically contraindicated. Patient requires contact precautions if  hospitalized. CRITICAL RESULT CALLED TO, READ BACK BY AND VERIFIED WITH: WILL ANDERSON @2010  ON 10/23/23 SKL    Staphylococcus epidermidis NOT DETECTED NOT DETECTED Final   Staphylococcus lugdunensis NOT DETECTED NOT DETECTED Final   Streptococcus species NOT DETECTED NOT DETECTED Final   Streptococcus agalactiae NOT DETECTED NOT DETECTED Final   Streptococcus pneumoniae NOT DETECTED NOT DETECTED Final   Streptococcus pyogenes NOT DETECTED NOT DETECTED Final   A.calcoaceticus-baumannii NOT DETECTED NOT DETECTED Final   Bacteroides fragilis NOT DETECTED NOT DETECTED Final   Enterobacterales NOT DETECTED NOT DETECTED Final   Enterobacter cloacae complex NOT DETECTED NOT DETECTED Final   Escherichia coli NOT DETECTED  NOT DETECTED Final   Klebsiella aerogenes NOT DETECTED NOT DETECTED Final   Klebsiella oxytoca NOT DETECTED NOT DETECTED Final   Klebsiella pneumoniae NOT DETECTED NOT DETECTED Final   Proteus species NOT DETECTED NOT DETECTED Final   Salmonella species NOT DETECTED NOT DETECTED Final   Serratia marcescens NOT DETECTED NOT DETECTED Final   Haemophilus influenzae NOT DETECTED NOT DETECTED Final   Neisseria meningitidis NOT DETECTED NOT DETECTED Final   Pseudomonas aeruginosa NOT DETECTED NOT DETECTED Final   Stenotrophomonas maltophilia NOT DETECTED NOT DETECTED Final   Candida albicans NOT DETECTED NOT DETECTED Final   Candida auris NOT DETECTED NOT DETECTED Final   Candida glabrata NOT DETECTED NOT DETECTED Final   Candida krusei NOT DETECTED NOT DETECTED Final   Candida parapsilosis NOT DETECTED NOT DETECTED Final   Candida tropicalis NOT DETECTED NOT DETECTED Final   Cryptococcus neoformans/gattii NOT DETECTED NOT DETECTED Final   Meth resistant mecA/C and MREJ DETECTED (A) NOT DETECTED Final     Comment: CRITICAL RESULT CALLED TO, READ BACK BY AND VERIFIED WITH: WILL ANDERSON @2010  ON 10/23/23 SKL Performed at [facility], [address] [place], [place] [address]     IMAGING RESULTS:  I have personally reviewed the films ?Mild marrow edema and enhancement T6-T9 Impression/Recommendation. Mild paraspinal inflammatory change with edema and enhancement . Concerning for osteomyelitis ? ? ?CT abdomen - shows inflamed pancreas    _CXR no infiltrate  Impression/recommendation  Acute pancreatitis with possible evolving necrosis tail and body  MRSA bacteremia-day 4of admission- no culture done one admission Osteomyelitis T6-T9- too soon for bacteremia to seed the spine in the hospital On vancomycin  DC zosyn  Will repeat Blood culture 48 hrs after appropriate antibiotic 2 D echo  Left great toe painful swelling - Gout VS MRSA   Anemia  Leucoytosis resolved  ETOH use  Past h/o transverse myelitis in 2011  Discussed with patient, requesting provider [facility] physician available by phone for urgent issues this weekend   ________________________________________________

## 2023-10-24 NOTE — Progress Notes (Signed)
*  PRELIMINARY RESULTS* Echocardiogram 2D Echocardiogram has been performed.  Danielle Harrison 10/24/2023, 4:04 PM

## 2023-10-24 NOTE — Progress Notes (Addendum)
 PROGRESS NOTE    Danielle Harrison  FMW:[id number] DOB: [birth year] DOA: 10/18/2023 PCP: [facility]  230A/230A-AA  LOS: 6 days   Brief hospital course: Danielle Harrison is a 48 y.o. female with medical history Anxiety, Asthma, HLD, HTN restless leg syndrome, etoh abuse, hx of etoh pancreatitis who presents to ED with n/v/ severe abdominal pain x 48 hours.  She notes  pain mid to lower abdomen and radiates towards her back. She note no fever /chills/ chest pain or sob. Patient also noted normal bowel movements, no diarrhea or constipation. Notes n/v improved s/p treatment in ED. Currently pain is a 8/10. Patient states  she  last drank 4 days ago. She denies any history of severe withdrawal  Assessment & Plan:  Acute ETOH pancreatitis  With developing necrosis --continued to have abdominal pain.  Started having fevers.  Repeat CT a/p showed finding concerning for developing necrosis.  No organized fluid collection. --abd pain essentially resolved and is tolerating liquids - will speak w/ IR and gen surg today - continue vanc, per ID can d/c zosyn  today - given resolution of abd pain and tolerating clears will attempt to advance diet to full liquids today  Mrsa bacteremia Sepsis Hemodynamically stable. Patient denies hardware, hx ivdu, new piv yesterday --continue vanc - ID to see - TTE  Thoracic vertebral osteomyelitis Right MTP swelling Concern for this on mri, has mrsa bacteremia - neurosurg says no surgical inervention, ir says nothing for them to intervene upon either - abx as above - monitor right MTP joint, may need imaging if worsens, could have seeded that joint as well  Focal small bowel intussusception in the left mid-abdomen  --Per [facility]urg, likely incidental, No clinical evidence of bowel obstruction or ischemia. No surgical intervention is needed from that standpoint either.  --repeat CT a/p showed the same. Plan: --Short interval follow up CT is recommended  to ensure resolution and exclude a neoplastic lead point.   Acute metabolic acidosis  Lactic acidosis --resolved after IVF   Hyponatremia, presumed due to pancreatitis --continue ivf   Hyperglycemia  Prediabetes --A1c 6.3   Hx of HTN --hold Toprol  (new)   HLD not taking medications due to loss of insurance Follow outpatient   ETOH  --no signs of withdrawal   Reactive ileus and constipation --resolved with aggressive Miralax     DVT prophylaxis: Lovenox  SQ Code Status: Full code  Family Communication:  Level of care: Telemetry Medical Dispo:   The patient is from: home Anticipated d/c is to: home Anticipated d/c date is: to be determined   Subjective and Interval History:  Abd pain resolved, tolerating fluids, back pain persists left side thoracic   Objective: Vitals:   10/23/23 2300 10/24/23 0243 10/24/23 0643 10/24/23 0816  BP: 127/89 (!) 145/112 105/65 126/69  Pulse: (!) 125 (!) 123 (!) 133 (!) 114  Resp: 20 20  18   Temp: 98 F (36.7 C) 100.1 F (37.8 C) (!) 101.2 F (38.4 C) 98.6 F (37 C)  TempSrc: Oral Oral Oral   SpO2: 99% 99% 100% 93%  Weight:      Height:        Intake/Output Summary (Last 24 hours) at 10/24/2023 0819 Last data filed at 10/24/2023 0500 Gross per 24 hour  Intake 1310 ml  Output 100 ml  Net 1210 ml   Filed Weights   10/18/23 1301  Weight: 54.4 kg    Examination:   Constitutional: NAD, AAOx3 HEENT: conjunctivae and lids normal, EOMI  CV: No cyanosis.  Tachycardic, rr RESP: normal respiratory effort, on RA Neuro: II - XII grossly intact.   Msk: no spinous process tencerness. Mild swelling left first mtp   Data Reviewed: I have personally reviewed labs and imaging studies  CRITICAL CARE Performed by: Devaughn KATHEE Ban   Total critical care time: 48 minutes  Critical care time was exclusive of separately billable procedures and treating other patients.  Critical care was necessary to treat or prevent imminent or  life-threatening deterioration.  Critical care was time spent personally by me on the following activities: development of treatment plan with patient and/or surrogate as well as nursing, discussions with consultants, evaluation of patient's response to treatment, examination of patient, obtaining history from patient or surrogate, ordering and performing treatments and interventions, ordering and review of laboratory studies, ordering and review of radiographic studies, pulse oximetry and re-evaluation of patient's condition.     Devaughn KATHEE Ban, MD [facility] If 7PM-7AM, please contact night-coverage 10/24/2023, 8:19 AM

## 2023-10-24 NOTE — Consult Note (Addendum)
 Subjective:   CC: pancreatitis  HPI:  Danielle Harrison is a 48 y.o. female who was consulted by [facility] for evaluation of above.  Initially admitted for pancreatitis and surgery consulted for incidental intussusception.  Patient subsequently has been getting supportive care for her pancreatitis but developed a fever a couple days ago.  Repeat CT imaging showed possible necrotizing pancreatitis now as well as blood cultures positive for MRSA bacteremia and concerns for possible osteomyelitis of her thoracic spine.  Surgery reconsulted for the progressing pancreatitis and concerns for development of necrosis based on imaging.  Patient currently states her abdominal pain has overall improved since admission but still present.  8 out of 10.  She is now complaining of back pain as well.  Currently on a clear liquid diet and tolerating that well.   Past Medical History:  has a past medical history of Anxiety, Arthritis, Asthma, Neuromuscular disorder (HCC), and Restless leg syndrome.  Past Surgical History:  has a past surgical history that includes Cesarean section; Colonoscopy with propofol  (N/A, 06/28/2019); Esophagogastroduodenoscopy (egd) with propofol  (N/A, 06/28/2019); and biopsy (06/28/2019).  Family History: family history is not on file.  Social History:  reports that she has quit smoking. Her smoking use included cigarettes. She has a 3 pack-year smoking history. She has never used smokeless tobacco. She reports current alcohol use. She reports that she does not use drugs.  Current Medications:  Prior to Admission medications   Medication Sig Start Date End Date Taking? Authorizing Provider  acetaminophen  (TYLENOL ) 500 MG tablet Take 1,000 mg by mouth every 8 (eight) hours as needed.   Yes [provider]  ibuprofen (ADVIL) 200 MG tablet Take 800 mg by mouth every 8 (eight) hours as needed.   Yes [provider]  amLODipine  (NORVASC ) 10 MG tablet Take 1 tablet (10 mg total) by  mouth daily for 3 days. 02/01/22 02/04/22  Ghimire, Kuber, MD  cetirizine  (ZYRTEC ) 10 MG tablet Take 4 tablets (40 mg total) by mouth daily for 5 days. 06/10/21 06/15/21  Cuthriell, Dorn BIRCH, PA-C  famotidine  (PEPCID ) 20 MG tablet Take 1 tablet (20 mg total) by mouth daily. 06/10/21 06/10/22  Cuthriell, Dorn BIRCH, PA-C  ondansetron  (ZOFRAN ) 4 MG tablet Take 1 tablet (4 mg total) by mouth every 6 (six) hours as needed for nausea. Patient not taking: Reported on 10/19/2023 02/01/22   Raenelle Coria, MD    Allergies:  Allergies  Allergen Reactions   Diclofenac Hives    ROS:  General: Denies weight loss, weight gain, fatigue, fevers, chills, and night sweats. Eyes: Denies blurry vision, double vision, eye pain, itchy eyes, and tearing. Ears: Denies hearing loss, earache, and ringing in ears. Nose: Denies sinus pain, congestion, infections, runny nose, and nosebleeds. Mouth/throat: Denies hoarseness, sore throat, bleeding gums, and difficulty swallowing. Heart: Denies chest pain, palpitations, racing heart, irregular heartbeat, leg pain or swelling, and decreased activity tolerance. Respiratory: Denies breathing difficulty, shortness of breath, wheezing, cough, and sputum. GI: Denies change in appetite, heartburn, nausea, vomiting, constipation, diarrhea, and blood in stool. GU: Denies difficulty urinating, pain with urinating, urgency, frequency, blood in urine. Musculoskeletal: Denies joint stiffness, pain, swelling, muscle weakness. Skin: Denies rash, itching, mass, tumors, sores, and boils Neurologic: Denies headache, fainting, dizziness, seizures, numbness, and tingling. Psychiatric: Denies depression, anxiety, difficulty sleeping, and memory loss. Endocrine: Denies heat or cold intolerance, and increased thirst or urination. Blood/lymph: Denies easy bruising, easy bruising, and swollen glands     Objective:     BP  121/87 (BP Location: Right Wrist)   Pulse (!) 104   Temp 97.8 F (36.6  C)   Resp 16   Ht 5' (1.524 m)   Wt 54.4 kg   SpO2 100%   BMI 23.44 kg/m   Constitutional :  alert, cooperative, appears stated age, and no distress  Lymphatics/Throat:  no asymmetry, masses, or scars  Respiratory:  clear to auscultation bilaterally  Cardiovascular:  regular rate and rhythm  Gastrointestinal: Soft, no guarding, tenderness to palpation epigastric region .  Musculoskeletal: Steady gait and movement  Skin: Cool and moist  Psychiatric: Normal affect, non-agitated, not confused       LABS:     Latest Ref Rng & Units 10/24/2023    5:30 AM 10/23/2023    6:36 AM 10/22/2023   11:42 AM  CMP  Glucose 70 - 99 mg/dL 755  740  788   BUN 6 - 20 mg/dL 6  6  9    Creatinine 0.44 - 1.00 mg/dL 9.25  9.37  9.40   Sodium 135 - 145 mmol/L 125  126  129   Potassium 3.5 - 5.1 mmol/L 3.5  4.1  4.4   Chloride 98 - 111 mmol/L 93  91  91   CO2 22 - 32 mmol/L 21  23  25    Calcium 8.9 - 10.3 mg/dL 8.8  8.8  9.6       Latest Ref Rng & Units 10/24/2023    5:30 AM 10/23/2023    6:36 AM 10/22/2023   11:42 AM  CBC  WBC 4.0 - 10.5 K/uL 9.4  9.3  5.5   Hemoglobin 12.0 - 15.0 g/dL 89.9  89.3  88.2   Hematocrit 36.0 - 46.0 % 29.4  31.3  34.3   Platelets 150 - 400 K/uL 201  227  260     RADS: CLINICAL DATA:  Initial evaluation for severe back pain, worsened bacteremia.   EXAM: MRI THORACIC WITHOUT AND WITH CONTRAST   TECHNIQUE: Multiplanar and multiecho pulse sequences of the thoracic spine were obtained without and with intravenous contrast.   CONTRAST:  5mL GADAVIST  GADOBUTROL  1 MMOL/ML IV SOLN   COMPARISON:  Prior MRI from 08/14/2010.   FINDINGS: Alignment: Vertebral bodies normally aligned with preservation of the normal thoracic kyphosis. Underlying trace dextroscoliosis. No significant listhesis.   Vertebrae: Vertebral body height maintained without acute or chronic fracture. Bone marrow signal intensity within normal limits. No worrisome osseous lesions.   Mild marrow  edema and enhancement seen within the T6 through T9 vertebral bodies (series 24, image 9). Mild paraspinous inflammatory change with edema and enhancement seen within the paraspinous soft tissues at these levels (series 21, image 21 for example). No discrete soft tissue collections. Given provided history, findings are concerning for acute infection with osteomyelitis. Associated mild epidural enhancement involving the epidural space extending from T6 through T9 (series 24, image 9). No frank epidural abscess or collection.   No other convincing evidence for acute infection elsewhere within the thoracic spine. Additional mild reactive endplate changes about the T3-4 and T9-10 interspaces favored to be degenerative.   Cord: Normal signal and morphology. Mild epidural enhancement involving the ventral epidural space at T6 and T7, as well as the dorsal epidural space at T6 through T9. No frank epidural collection or significant stenosis. No other abnormal enhancement. Cord itself demonstrates normal signal and morphology.   Paraspinal and other soft tissues: Mild paraspinous inflammation adjacent to the T6 through T9 vertebral bodies as above. No  collections. Paraspinous soft tissues demonstrate no other acute finding. Small layering left pleural effusion noted.   Disc levels:   Degenerative disc disease with mild disc bulging and reactive endplate spurring noted throughout the thoracic spine. No focal disc herniation. No significant facet disease. No significant spinal stenosis. Foramina remain patent.   IMPRESSION: 1. Mild marrow edema and enhancement involving the T6 through T9 vertebral bodies with associated paraspinous inflammatory changes as above. Given provided history, findings are concerning for acute infection with osteomyelitis. No evidence for intervening discitis. Associated mild epidural enhancement extending from T6 through T9 without frank epidural abscess or  collection. 2. Underlying thoracic spondylosis without significant stenosis or impingement. 3. Small layering left pleural effusion.     Electronically Signed   By: Morene Hoard M.D.   On: 10/24/2023 05:00  Assessment:     Pancreatitis, now with concerns of progressing to necrotic pancreatitis, along with MRSA bacteremia and possible spinal osteomyelitis as well.  MRSA source possibly from the pancreatitis?   Plan:     Start abx per ID, continue IVF for resuscitation, may need to consider feeding tube placement beyond second portion of duodenum to minimize pancreatic stimulation?  Also recommend low threshold for transfer to tertiary care center since any worsening in clinical status could indicate need for a necresectomy, which cannot be performed here at [facility]  Persistent, recurrent intussusception noted as well, no sign of obstruction symptoms so no further intervention needed.      Surgery will continue to follow.

## 2023-10-24 NOTE — Progress Notes (Signed)
 Initial Nutrition Assessment  DOCUMENTATION CODES:   Not applicable  INTERVENTION:   Recommend post pyloric NGT placement and nutrition support if pt is unable to tolerate full liquid diet or if oral intake is poor  Ensure Enlive po BID, each supplement provides 350 kcal and 20 grams of protein.  MVI, folic acid  and thiamine  po daily   Pt at high refeed risk; recommend monitor potassium, magnesium  and phosphorus labs daily until stable  Daily weights   If NGT placed, recommend:  Vital 1.5 @50ml /hr- Initiate at 16ml/hr and increase by 10ml/hr q 8 hours until goal rate is reached.   Free water  flushes 30ml q4 hours to maintain tube patency   Regimen provides 1800kcal/day, 81g/day protein and 1172ml/day of free water .   NUTRITION DIAGNOSIS:   Inadequate oral intake related to acute illness as evidenced by other (comment) (pt on NPO/liquid diet for > 7 days)  GOAL:   Patient will meet greater than or equal to 90% of their needs  MONITOR:   PO intake, Supplement acceptance, Labs, Weight trends, Skin, I & O's  REASON FOR ASSESSMENT:   NPO/Clear Liquid Diet    ASSESSMENT:   48 y/o female with h/o etoh abuse, HLD and anxiety who is admitted with acute necrotizing pancreatitis with incidental finding of small bowel intussusception, MRSA bacteremia and concerns for possible osteomyelitis of her thoracic spine.  Met with pt in room today. Pt appears miserable today, reports ongoing back pain with abdominal bloating and distension that she relates to constipation. Pt reports that she received miralax  and has had numerous bowel movements today but remains bloated. Pt denies any abdominal pain. Pt reports that she has been drinking some juice and clear liquids; however pt has numerous juices piled up on her side table untouched. Pt refused her lunch today. RD discussed with pt the importance of adequate nutrition needed to preserve lean muscle. Discussed pancreatitis with patient and  explained that adequate nutrition is important to support healing. Pt is agreeable to drinking supplements (prefers chocolate). Pt has remained on clear liquids throughout admission and is now without adequate nutrition for 7 days. Pt advanced to a full liquid diet today. RD will add Ensure to help pt meet her estimated needs. Pt is at high refeed risk. Recommend fluoroscopy guided post pyloric NGT placement and nutrition support if pt does not tolerate full liquids or if her oral intake is poor; this was discussed with MD and pt. Pt is agreeable to NGT if needed.   Per chart, pt remains weight stable pta. Pt has not been weighed since admission. Pt is at high risk for developing malnutrition. NFPE somewhat limited today as pt wearing regular clothes.    Medications reviewed and include: lovenox , folic acid , MVI, thiamine , vancomycin    Labs reviewed: Na 125(L), K 3.5 wnl, Mg 1.6(L) Hgb 10.0(L), Hct 29.4(L), MCV 100.3(H), MCH 34.1(L) AIC 6.3(H)- 1/4  NUTRITION - FOCUSED PHYSICAL EXAM:   Flowsheet Row Most Recent Value  Orbital Region No depletion  Upper Arm Region No depletion  Thoracic and Lumbar Region No depletion  Buccal Region No depletion  Temple Region Mild depletion  Clavicle Bone Region Mild depletion  Clavicle and Acromion Bone Region Mild depletion  Scapular Bone Region No depletion  Dorsal Hand Mild depletion  Patellar Region No depletion  Anterior Thigh Region No depletion  Posterior Calf Region No depletion  Edema (RD Assessment) None  Hair Reviewed  Eyes Reviewed  Mouth Reviewed  Skin Reviewed  Nails Reviewed  Diet Order:   Diet Order             Diet full liquid Fluid consistency: Thin  Diet effective now                  EDUCATION NEEDS:   Education needs have been addressed  Skin:  Skin Assessment: Reviewed RN Assessment  Last BM:  1/8- constipation  Height:   Ht Readings from Last 1 Encounters:  10/18/23 5' (1.524 m)    Weight:   Wt  Readings from Last 1 Encounters:  10/18/23 54.4 kg    Ideal Body Weight:  45.45 kg  BMI:  Body mass index is 23.44 kg/m.  Estimated Nutritional Needs:   Kcal:  1500-1700kcal/day  Protein:  75-85g/day  Fluid:  1.4-1.6L/day  Augustin Shams MS, RD, LDN If unable to be reached, please send secure chat to RD inpatient available from 8:00a-4:00p daily

## 2023-10-25 ENCOUNTER — Encounter: Payer: Self-pay | Admitting: Radiology

## 2023-10-25 ENCOUNTER — Inpatient Hospital Stay: Payer: Medicaid Other

## 2023-10-25 DIAGNOSIS — K859 Acute pancreatitis without necrosis or infection, unspecified: Secondary | ICD-10-CM | POA: Diagnosis not present

## 2023-10-25 LAB — BASIC METABOLIC PANEL
Anion gap: 8 (ref 5–15)
BUN: <5 mg/dL — ABNORMAL LOW (ref 6–20)
CO2: 23 mmol/L (ref 22–32)
Calcium: 9 mg/dL (ref 8.9–10.3)
Chloride: 95 mmol/L — ABNORMAL LOW (ref 98–111)
Creatinine, Ser: 0.63 mg/dL (ref 0.44–1.00)
GFR, Estimated: >60 mL/min (ref >60)
Glucose, Bld: 297 mg/dL — ABNORMAL HIGH (ref 70–99)
Potassium: 3.3 mmol/L — ABNORMAL LOW (ref 3.5–5.1)
Sodium: 126 mmol/L — ABNORMAL LOW (ref 135–145)

## 2023-10-25 LAB — CBC
HCT: 27.2 % — ABNORMAL LOW (ref 36.0–46.0)
Hemoglobin: 9.5 g/dL — ABNORMAL LOW (ref 12.0–15.0)
MCH: 33.9 pg (ref 26.0–34.0)
MCHC: 34.9 g/dL (ref 30.0–36.0)
MCV: 97.1 fL (ref 80.0–100.0)
Platelets: 183 10*3/uL (ref 150–400)
RBC: 2.8 MIL/uL — ABNORMAL LOW (ref 3.87–5.11)
RDW: 12.5 % (ref 11.5–15.5)
WBC: 12 10*3/uL — ABNORMAL HIGH (ref 4.0–10.5)
nRBC: 0 % (ref 0.0–0.2)

## 2023-10-25 LAB — MAGNESIUM: Magnesium: 1.7 mg/dL (ref 1.7–2.4)

## 2023-10-25 LAB — PHOSPHORUS: Phosphorus: 2.2 mg/dL — ABNORMAL LOW (ref 2.5–4.6)

## 2023-10-25 LAB — URIC ACID: Uric Acid, Serum: 2.9 mg/dL (ref 2.5–7.1)

## 2023-10-25 MED ORDER — GADOBUTROL 1 MMOL/ML IV SOLN
5.0000 mL | Freq: Once | INTRAVENOUS | Status: AC | PRN
  Administered 2023-10-25: 5 mL via INTRAVENOUS

## 2023-10-25 MED ORDER — SODIUM CHLORIDE 0.9 % IV SOLN
2.0000 g | Freq: Three times a day (TID) | INTRAVENOUS | Status: AC
  Administered 2023-10-25 – 2023-10-29 (×12): 2 g via INTRAVENOUS
  Filled 2023-10-25 (×13): qty 12.5

## 2023-10-25 MED ORDER — K PHOS MONO-SOD PHOS DI & MONO 155-852-130 MG PO TABS
500.0000 mg | ORAL_TABLET | Freq: Four times a day (QID) | ORAL | Status: AC
  Administered 2023-10-25 (×3): 500 mg via ORAL
  Filled 2023-10-25 (×3): qty 2

## 2023-10-25 MED ORDER — PIPERACILLIN-TAZOBACTAM 3.375 G IVPB
3.3750 g | Freq: Three times a day (TID) | INTRAVENOUS | Status: DC
  Administered 2023-10-25: 3.375 g via INTRAVENOUS
  Filled 2023-10-25: qty 50

## 2023-10-25 MED ORDER — VANCOMYCIN HCL 750 MG IV SOLR
750.0000 mg | Freq: Two times a day (BID) | INTRAVENOUS | Status: DC
  Administered 2023-10-25 – 2023-10-26 (×2): 750 mg via INTRAVENOUS
  Filled 2023-10-25: qty 750
  Filled 2023-10-25 (×2): qty 15

## 2023-10-25 MED ORDER — METRONIDAZOLE 500 MG/100ML IV SOLN
500.0000 mg | Freq: Two times a day (BID) | INTRAVENOUS | Status: AC
  Administered 2023-10-25 – 2023-10-29 (×9): 500 mg via INTRAVENOUS
  Filled 2023-10-25 (×10): qty 100

## 2023-10-25 NOTE — Progress Notes (Addendum)
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  CT abdomen pelvis showed acute unchanged pancreatitis, decreased attention to body intake concern for necrosis. - MRI T/L/C-spine showed T6-T9 paraspinal inflammation, mild marrow edema, mild epidural enhancement.  Concerning for osteomyelitis-NSYconsult no plans for intervention - MRI left foot showed large complex first MTP joint effusion erosive changes, concerning for septic arthritis on osteomyelitis.  Large fluid collection and deep plantar soft tissue of forefoot measuring 4.2 X1.7X 1.8 cm concerning for abscess. Plan: -Continue vancomycin  -d/c zosyn , start cefepime  and metro -Repeat blood Cx -TTE -Consult podiatry for right foot infection-> on broad-spectrum antibiotics

## 2023-10-25 NOTE — Consult Note (Signed)
 Pharmacy Antibiotic Note  ASSESSMENT: 48 y.o. female with PMH including asthma, EtOH misuse, hx EtOH pancreatitis is presenting with  MRSA bacteremia . 4 out of 4 bottles positive for MRSA.  Fevers started 1/8. Currently receiving Zosyn  due to acute pancreatitis with concern for early necrosis. Discussed with MD and source of bacteremia is unknown at this point however will obtain an MRI of spine because patient was complaining of severe back pain. Pharmacy has been consulted to manage vancomycin  dosing.  Today, 10/25/2023 Day #2 vancomycin   Renal: SCr 0.63 - slight decrease from 1/10- may not be significant but need to monitor closely.   WBC elevated at 12 Tmax 103 1/9 blood cx: 4/4 GPC, S aureus 1/9 MRI - concern for T6-9 discitis   PLAN: Continue vancomycin  to 750mg  IV q12h based on age and renal function  Goal AUC 400-600 eAUC 577, predicted Cmax 35, Cmin 16.8 Used SCr 0.8, TBW (not IBW) for Ke/CrCl as this calculated CrCl makes more sense for patients age Follow up culture results to assess for antibiotic optimization. Monitor renal function to assess for any necessary antibiotic dosing changes. Daily BMP ordered until 1/12 Recommend checking vancomycin  levels at steady-state Discontinued Zosyn  per ID and transition to cefepime  + metronidazole  Cefepime  2 g IV q8h Metronidazole  500 mg IV q12h  Patient measurements: Height: 5' (152.4 cm) Weight: 121.8 kg (268 lb 8.3 oz) IBW/kg (Calculated) : 45.5  Vital signs: Temp: 98 F (36.7 C) (01/11 1320) Temp Source: Oral (01/11 1320) BP: 112/83 (01/11 1320) Pulse Rate: 111 (01/11 1320) Recent Labs  Lab 10/23/23 0636 10/24/23 0530 10/25/23 0444  WBC 9.3 9.4 12.0*  CREATININE 0.62 0.74 0.63   Estimated Creatinine Clearance: 104.3 mL/min (by C-G formula based on SCr of 0.63 mg/dL).  Allergies: Allergies  Allergen Reactions   Diclofenac Hives    Antimicrobials this admission: Zosyn  1/9 >>  Vancomycin  1/9 >>  Dose  adjustments this admission: N/A  Microbiology results: 1/9 BCx: 4/4, MRSA  Thank you for allowing pharmacy to be a part of this patient's care.  Lum Mania, PharmD 10/25/2023 3:38 PM

## 2023-10-25 NOTE — Progress Notes (Signed)
       CROSS COVER NOTE  NAME: GISSELA BLOCH MRN: [id number] DOB : Nov 06, 1975 ATTENDING PHYSICIAN: Kandis Devaughn Sayres, MD    Date of Service   10/25/2023   HPI/Events of Note   Notified by RN patient with concerns of pain and edema left foot Patient with MRSA bacteremia with ongoing fevers  Interventions   Assessment/Plan:    10/25/2023    4:49 AM 10/25/2023   12:40 AM 10/24/2023    8:58 PM  Vitals with BMI  Weight 268 lbs 8 oz    BMI 52.44    Systolic 125 137 858  Diastolic 83 90 81  Pulse 134 128 124   T max over night 103 Left foot erythema and edema especially and great toe proximal joint. Foot warm to touch Initial xray shows lucency and arthropathy suspicious for osteomyelitis/septic arthritis MRI left foot completed, not read yet Message for ID doc sent       Erminio LITTIE Cone NP [facility] Cross Cover 7pm-7am - check amion for availability Pager [phone]

## 2023-10-25 NOTE — Progress Notes (Signed)
 Subjective:  CC: Danielle Harrison is a 48 y.o. female  Hospital stay day 7,   Pancreatitis, now with concerns of progressing to necrotic pancreatitis, along with MRSA bacteremia and possible spinal osteomyelitis as well. MRSA source possibly from the pancreatitis?   HPI: Still complains of abdominal pain but stable.  Decreased appetite now.  Left foot swelling now concerning for spreading osteomyelitis  ROS:  General: Denies weight loss, weight gain, fatigue, fevers, chills, and night sweats. Heart: Denies chest pain, palpitations, racing heart, irregular heartbeat, leg pain or swelling, and decreased activity tolerance. Respiratory: Denies breathing difficulty, shortness of breath, wheezing, cough, and sputum. GI: Denies change in appetite, heartburn, nausea, vomiting, constipation, diarrhea, and blood in stool. GU: Denies difficulty urinating, pain with urinating, urgency, frequency, blood in urine.   Objective:   Temp:  [97.8 F (36.6 C)-103 F (39.4 C)] 98.6 F (37 C) (01/11 0854) Pulse Rate:  [102-134] 102 (01/11 0854) Resp:  [16-20] 20 (01/11 0854) BP: (106-141)/(74-96) 106/74 (01/11 0854) SpO2:  [91 %-100 %] 100 % (01/11 0854) Weight:  [121.8 kg] 121.8 kg (01/11 0449)     Height: 5' (152.4 cm) Weight: 121.8 kg BMI (Calculated): 52.44   Intake/Output this shift:   Intake/Output Summary (Last 24 hours) at 10/25/2023 1012 Last data filed at 10/25/2023 0500 Gross per 24 hour  Intake 1200 ml  Output --  Net 1200 ml    Constitutional :  alert, cooperative, appears stated age, and no distress  Respiratory:  clear to auscultation bilaterally  Cardiovascular:  Tachycardia rate  Gastrointestinal: Soft, no guarding, continued tenderness to palpation epigastric region .   Skin: Cool and moist.  Increased swelling in left foot with erythema and warmth  Psychiatric: Normal affect, non-agitated, not confused       LABS:     Latest Ref Rng & Units 10/25/2023    4:44 AM 10/24/2023     5:30 AM 10/23/2023    6:36 AM  CMP  Glucose 70 - 99 mg/dL 702  755  740   BUN 6 - 20 mg/dL 5  6  6    Creatinine 0.44 - 1.00 mg/dL 9.36  9.25  9.37   Sodium 135 - 145 mmol/L 126  125  126   Potassium 3.5 - 5.1 mmol/L 3.3  3.5  4.1   Chloride 98 - 111 mmol/L 95  93  91   CO2 22 - 32 mmol/L 23  21  23    Calcium 8.9 - 10.3 mg/dL 9.0  8.8  8.8       Latest Ref Rng & Units 10/25/2023    4:44 AM 10/24/2023    5:30 AM 10/23/2023    6:36 AM  CBC  WBC 4.0 - 10.5 K/uL 12.0  9.4  9.3   Hemoglobin 12.0 - 15.0 g/dL 9.5  89.9  89.3   Hematocrit 36.0 - 46.0 % 27.2  29.4  31.3   Platelets 150 - 400 K/uL 183  201  227     RADS: N/a Assessment:  Pancreatitis, now with concerns of progressing to necrotic pancreatitis, along with MRSA bacteremia and possible spinal osteomyelitis as well. MRSA source possibly from the pancreatitis?   Patient now has white count, decreased appetite, and now possible left foot involvement for osteomyelitis.  Recommend transfer to tertiary care center for a second opinion regarding the necrotizing pancreatitis to prevent further decline in health.  Supportive care in the meantime    labs/images/medications/previous chart entries reviewed personally and relevant changes/updates noted  above.

## 2023-10-25 NOTE — Progress Notes (Signed)
 PROGRESS NOTE    Danielle Harrison  FMW:[id number] DOB: 30-Jan-1976 DOA: 10/18/2023 PCP: [facility]  [id number]  LOS: 7 days   Brief hospital course: Danielle Harrison is a 48 y.o. female with medical history Anxiety, Asthma, HLD, HTN restless leg syndrome, etoh abuse, hx of etoh pancreatitis who presents to ED with n/v/ severe abdominal pain x 48 hours.  She notes  pain mid to lower abdomen and radiates towards her back. She note no fever /chills/ chest pain or sob. Patient also noted normal bowel movements, no diarrhea or constipation. Notes n/v improved s/p treatment in ED. Currently pain is a 8/10. Patient states  she  last drank 4 days ago. She denies any history of severe withdrawal  Assessment & Plan:  Acute ETOH pancreatitis  With developing necrosis --continued to have abdominal pain.  Started having fevers.  Repeat CT a/p showed finding concerning for developing necrosis.  No organized fluid collection. --abd pain improving and is tolerating full liquids -General Surgery following recommended transfer to [facility] care center - continue vanc, per ID can d/c zosyn  on 1/10 but resumed to Zosyn  on 1/11 due to right foot infection 1/11 called to [facility] for transfer, awaiting for callback  Mrsa bacteremia Sepsis Hemodynamically stable. Patient denies hardware, hx ivdu, new piv yesterday --continue vanc - ID to see - TTE LVEF 60-65%, aortic valve thickening, recommended TEE to rule out endocarditis Please consult cardiology tomorrow for TEE to be done on Monday  Thoracic vertebral osteomyelitis Concern for this on mri, has mrsa bacteremia - neurosurg says no surgical inervention, ir says nothing for them to intervene upon either - abx as above - monitor right MTP joint, may need imaging if worsens, could have seeded that joint as well  # Right MTP swelling MRI shows possible septic arthritis great toe and fluid collection in the foot possible abscess Patient was on vancomycin ,  started Zosyn  as well Podiatry consulted Follow uric acid level   # Focal small bowel intussusception in the left mid-abdomen  --Per [place], likely incidental, No clinical evidence of bowel obstruction or ischemia. No surgical intervention is needed from that standpoint either.  --repeat CT a/p showed the same. Plan: --Short interval follow up CT is recommended to ensure resolution and exclude a neoplastic lead point.   Acute metabolic acidosis  Lactic acidosis --resolved after IVF   Hyponatremia, presumed due to pancreatitis --continue ivf   Hypokalemia, potassium repleted Hypophosphatemia, Phos repleted Monitor electrolytes and replete as needed  Hyperglycemia  Prediabetes --A1c 6.3   Hx of HTN --hold Toprol     HLD not taking medications due to loss of insurance Follow outpatient   ETOH  --no signs of withdrawal   Reactive ileus and constipation --resolved with aggressive Miralax     DVT prophylaxis: Lovenox  SQ Code Status: Full code  Family Communication:  Level of care: Med-Surg Dispo:   The patient is from: home Anticipated d/c is to: home Anticipated d/c date is: to be determined 1/11 started transfer to tertiary center, called [facility] awaiting for callback.   Subjective and Interval History:  No significant events overnight, complaining of back pain 8-9/10 and abdominal pain is 5-6/10 tolerating liquid diet well, no nausea or vomiting.  Patient is complaining of worsening of pain in the left great toe.  No chest pain or palpitation, no nasal complaints.   Objective: Vitals:   10/25/23 0544 10/25/23 0836 10/25/23 0854 10/25/23 1320  BP:  114/75 106/74 112/83  Pulse:  (!) 110 ROLLEN)  102 (!) 111  Resp:  18 20 18   Temp: 98.6 F (37 C) 98.1 F (36.7 C) 98.6 F (37 C) 98 F (36.7 C)  TempSrc:   Oral Oral  SpO2:  94% 100% 99%  Weight:      Height:        Intake/Output Summary (Last 24 hours) at 10/25/2023 1441 Last data filed at 10/25/2023 0500 Gross  per 24 hour  Intake 1200 ml  Output --  Net 1200 ml   Filed Weights   10/18/23 1301 10/25/23 0449  Weight: 54.4 kg 121.8 kg    Examination:   Constitutional: NAD, AAOx3 HEENT: conjunctivae and lids normal, EOMI CV: No cyanosis.  Tachycardic, rr RESP: normal respiratory effort, on RA Neuro: II - XII grossly intact.   Msk: no spinous process tencerness. Mild swelling left first mtp   Data Reviewed: I have personally reviewed labs and imaging studies  CRITICAL CARE Performed by: Elvan Sor   Total critical care time: 55 minutes  Critical care time was exclusive of separately billable procedures and treating other patients.  Critical care was necessary to treat or prevent imminent or life-threatening deterioration.  Critical care was time spent personally by me on the following activities: development of treatment plan with patient and/or surrogate as well as nursing, discussions with consultants, evaluation of patient's response to treatment, examination of patient, obtaining history from patient or surrogate, ordering and performing treatments and interventions, ordering and review of laboratory studies, ordering and review of radiographic studies, pulse oximetry and re-evaluation of patient's condition.     Elvan Sor, MD [facility] If 7PM-7AM, please contact night-coverage 10/25/2023, 2:41 PM

## 2023-10-25 NOTE — Plan of Care (Signed)

## 2023-10-26 DIAGNOSIS — K8522 Alcohol induced acute pancreatitis with infected necrosis: Secondary | ICD-10-CM

## 2023-10-26 DIAGNOSIS — M009 Pyogenic arthritis, unspecified: Secondary | ICD-10-CM

## 2023-10-26 LAB — CBC
HCT: 27.7 % — ABNORMAL LOW (ref 36.0–46.0)
Hemoglobin: 9.4 g/dL — ABNORMAL LOW (ref 12.0–15.0)
MCH: 33.5 pg (ref 26.0–34.0)
MCHC: 33.9 g/dL (ref 30.0–36.0)
MCV: 98.6 fL (ref 80.0–100.0)
Platelets: 211 10*3/uL (ref 150–400)
RBC: 2.81 MIL/uL — ABNORMAL LOW (ref 3.87–5.11)
RDW: 13 % (ref 11.5–15.5)
WBC: 11 10*3/uL — ABNORMAL HIGH (ref 4.0–10.5)
nRBC: 0 % (ref 0.0–0.2)

## 2023-10-26 LAB — PHOSPHORUS: Phosphorus: 3.5 mg/dL (ref 2.5–4.6)

## 2023-10-26 LAB — BASIC METABOLIC PANEL
Anion gap: 11 (ref 5–15)
Anion gap: 9 (ref 5–15)
BUN: <5 mg/dL — ABNORMAL LOW (ref 6–20)
BUN: <5 mg/dL — ABNORMAL LOW (ref 6–20)
CO2: 23 mmol/L (ref 22–32)
CO2: 24 mmol/L (ref 22–32)
Calcium: 8.6 mg/dL — ABNORMAL LOW (ref 8.9–10.3)
Calcium: 8.6 mg/dL — ABNORMAL LOW (ref 8.9–10.3)
Chloride: 93 mmol/L — ABNORMAL LOW (ref 98–111)
Chloride: 92 mmol/L — ABNORMAL LOW (ref 98–111)
Creatinine, Ser: 0.52 mg/dL (ref 0.44–1.00)
Creatinine, Ser: <0.3 mg/dL — ABNORMAL LOW (ref 0.44–1.00)
GFR, Estimated: >60 mL/min (ref >60)
Glucose, Bld: 412 mg/dL — ABNORMAL HIGH (ref 70–99)
Glucose, Bld: 400 mg/dL — ABNORMAL HIGH (ref 70–99)
Potassium: 2.7 mmol/L — CL (ref 3.5–5.1)
Potassium: 3.3 mmol/L — ABNORMAL LOW (ref 3.5–5.1)
Sodium: 127 mmol/L — ABNORMAL LOW (ref 135–145)
Sodium: 125 mmol/L — ABNORMAL LOW (ref 135–145)

## 2023-10-26 LAB — MAGNESIUM: Magnesium: 1.8 mg/dL (ref 1.7–2.4)

## 2023-10-26 LAB — GLUCOSE, CAPILLARY
Glucose-Capillary: 412 mg/dL — ABNORMAL HIGH (ref 70–99)
Glucose-Capillary: 183 mg/dL — ABNORMAL HIGH (ref 70–99)

## 2023-10-26 LAB — CULTURE, BLOOD (ROUTINE X 2): Special Requests: ADEQUATE

## 2023-10-26 LAB — LIPASE, BLOOD: Lipase: 23 U/L (ref 11–51)

## 2023-10-26 MED ORDER — POTASSIUM CHLORIDE 10 MEQ/100ML IV SOLN
10.0000 meq | INTRAVENOUS | Status: AC
  Administered 2023-10-26 (×5): 10 meq via INTRAVENOUS
  Filled 2023-10-26 (×5): qty 100

## 2023-10-26 MED ORDER — VANCOMYCIN HCL IN DEXTROSE 750-5 MG/150ML-% IV SOLN
750.0000 mg | Freq: Two times a day (BID) | INTRAVENOUS | Status: DC
  Administered 2023-10-27: 750 mg via INTRAVENOUS
  Filled 2023-10-26 (×3): qty 150

## 2023-10-26 MED ORDER — VANCOMYCIN HCL 750 MG IV SOLR
750.0000 mg | Freq: Two times a day (BID) | INTRAVENOUS | Status: DC
  Administered 2023-10-26: 750 mg via INTRAVENOUS
  Filled 2023-10-26: qty 15
  Filled 2023-10-26: qty 750

## 2023-10-26 MED ORDER — INSULIN ASPART 100 UNIT/ML IJ SOLN
0.0000 [IU] | Freq: Three times a day (TID) | INTRAMUSCULAR | Status: DC
  Administered 2023-10-27: 3 [IU] via SUBCUTANEOUS
  Administered 2023-10-28: 7 [IU] via SUBCUTANEOUS
  Administered 2023-10-28: 2 [IU] via SUBCUTANEOUS
  Administered 2023-10-29: 5 [IU] via SUBCUTANEOUS
  Administered 2023-10-29: 3 [IU] via SUBCUTANEOUS
  Administered 2023-10-29: 5 [IU] via SUBCUTANEOUS
  Administered 2023-10-30: 2 [IU] via SUBCUTANEOUS
  Administered 2023-10-30: 7 [IU] via SUBCUTANEOUS
  Administered 2023-10-31 (×2): 3 [IU] via SUBCUTANEOUS
  Administered 2023-10-31: 7 [IU] via SUBCUTANEOUS
  Administered 2023-11-01: 2 [IU] via SUBCUTANEOUS
  Administered 2023-11-01: 3 [IU] via SUBCUTANEOUS
  Administered 2023-11-01: 5 [IU] via SUBCUTANEOUS
  Administered 2023-11-02: 3 [IU] via SUBCUTANEOUS
  Administered 2023-11-02: 1 [IU] via SUBCUTANEOUS
  Administered 2023-11-02: 7 [IU] via SUBCUTANEOUS
  Administered 2023-11-03 (×2): 2 [IU] via SUBCUTANEOUS
  Administered 2023-11-03 – 2023-11-04 (×2): 1 [IU] via SUBCUTANEOUS
  Administered 2023-11-04: 2 [IU] via SUBCUTANEOUS
  Filled 2023-10-26 (×21): qty 1

## 2023-10-26 MED ORDER — VANCOMYCIN HCL IN DEXTROSE 1-5 GM/200ML-% IV SOLN: 1000.0000 mg | INTRAVENOUS | Status: DC

## 2023-10-26 MED ORDER — VANCOMYCIN HCL 1250 MG/250ML IV SOLN: 1250.0000 mg | INTRAVENOUS | Status: DC

## 2023-10-26 MED ORDER — VANCOMYCIN HCL 750 MG IV SOLR: 750.0000 mg | Freq: Two times a day (BID) | INTRAVENOUS | Status: DC

## 2023-10-26 MED ORDER — INSULIN ASPART 100 UNIT/ML IJ SOLN
10.0000 [IU] | Freq: Once | INTRAMUSCULAR | Status: AC
  Administered 2023-10-26: 10 [IU] via SUBCUTANEOUS
  Filled 2023-10-26: qty 1

## 2023-10-26 MED ORDER — SODIUM CHLORIDE 0.9 % IV SOLN: INTRAVENOUS | Status: AC

## 2023-10-26 NOTE — Progress Notes (Signed)
 PROGRESS NOTE    Danielle Harrison  FMW:[id number]  DOB: 23-Nov-1975  DOA: 10/18/2023 PCP: [facility] Outpatient Specialists:   Hospital course:  48 year old female with ongoing alcohol use, repeated history of alcoholic pancreatitis, asthma, HTN was admitted last week with recurrent alcoholic pancreatitis.  Her course has been complicated by MRSA bacteremia in 2/2 bottles.  Patient was also noted to have likely septic arthritis of right first MPJ as well as osteomyelitis in T6-T9.  Patient has been treated with vancomycin  and Zosyn , changed to cefepime  and metronidazole .  Podiatry has been consulted as has general surgery.  Imaging 2 days ago shows possible necrosis of the pancreas tail.  Surgery recommended transfer to tertiary care center.  Discussion with general surgery however notes that no indication for surgery and to continue present management so transfers were not accepted   Subjective:  Patient complains of intermittent belly intermittent back pain.  Notes that the pain goes back and forth she was up walking and pouring herself lemonade from a gallon jug when I went in to see her.  Patient states no increased symptoms with p.o.  Patient denies history of complicated withdrawal.  Objective: Vitals:   10/26/23 0539 10/26/23 0607 10/26/23 0710 10/26/23 1213  BP: (!) 142/98  115/78 (!) 145/86  Pulse: (!) 118  (!) 106 (!) 110  Resp: 18  20 20   Temp: 98.1 F (36.7 C)  98.6 F (37 C) 98.4 F (36.9 C)  TempSrc: Oral  Oral Oral  SpO2: 98%  100% 100%  Weight:  53.3 kg    Height:        Intake/Output Summary (Last 24 hours) at 10/26/2023 1226 Last data filed at 10/26/2023 1101 Gross per 24 hour  Intake 1075 ml  Output --  Net 1075 ml   Filed Weights   10/25/23 0449 10/25/23 1800 10/26/23 0607  Weight: 121.8 kg 55.6 kg 53.3 kg     Exam:  General: Thin patient walking around room slightly tremulous in NAD. Eyes: sclera anicteric, conjuctiva mild injection  bilaterally CVS: S1-S2, regular  Respiratory:  decreased air entry bilaterally secondary to decreased inspiratory effort, rales at bases  GI: Abdomen is distended, firm but not hard, some tenderness to palpation epigastric and upper quadrants bilaterally.  Patient states she has radiation to the back when I press on her abdomen in all quadrants.  No guarding. LE: No edema Neuro: A/O x 3,  grossly nonfocal.    Data Reviewed:  Basic Metabolic Panel: Recent Labs  Lab 10/22/23 1142 10/23/23 0636 10/24/23 0530 10/25/23 0444 10/26/23 0819  NA 129* 126* 125* 126* 127*  K 4.4 4.1 3.5 3.3* 2.7*  CL 91* 91* 93* 95* 93*  CO2 25 23 21* 23 23  GLUCOSE 211* 259* 244* 297* 412*  BUN 9 6 6  <5* <5*  CREATININE 0.59 0.62 0.74 0.63 0.52  CALCIUM 9.6 8.8* 8.8* 9.0 8.6*  MG 2.2 1.7 1.6* 1.7 1.8  PHOS  --   --   --  2.2* 3.5    CBC: Recent Labs  Lab 10/22/23 1142 10/23/23 0636 10/24/23 0530 10/25/23 0444 10/26/23 0819  WBC 5.5 9.3 9.4 12.0* 11.0*  HGB 11.7* 10.6* 10.0* 9.5* 9.4*  HCT 34.3* 31.3* 29.4* 27.2* 27.7*  MCV 100.3* 101.0* 100.3* 97.1 98.6  PLT 260 227 201 183 211     Scheduled Meds:  enoxaparin  (LOVENOX ) injection  40 mg Subcutaneous Q24H   feeding supplement  237 mL Oral BID BM   folic  acid  1 mg Oral Daily   guaiFENesin   600 mg Oral BID   multivitamin with minerals  1 tablet Oral Daily   thiamine   100 mg Oral Daily   Or   thiamine   100 mg Intravenous Daily   Continuous Infusions:  ceFEPime  (MAXIPIME ) IV 2 g (10/26/23 0615)   metronidazole  500 mg (10/26/23 0850)   potassium chloride      vancomycin  Stopped (10/26/23 0327)     Assessment & Plan:   Acute alcoholic pancreatitis Possible developing necrosis but no organized fluid collection General Surgery recommended transfer to tertiary care center however both [facility] and [facility] are at capacity and are not accepting transfers.  Discussed with Dr. Teresa at [facility], he noted that conservative management with  antibiotics of pancreatitis including necrosis is recommended with no instrumentation or surgery unless there is a large infected fluid collection.  This morning patient looked well walking around the room tolerating clear liquids without difficulty.  She is hemodynamically stable.  Secure chat sent to to Dr. Tye, Dr. Teresa is happy to discuss with Dr. Tye as needed..  Patient is doing well on clear liquids and pain management as already instituted Continue cefepime  and metronidazole  per ID Vancomycin  and Zosyn  were discontinued per ID  MRSA bacteremia Appreciate ongoing ID consultation Continue cefepime  and metronidazole  Echocardiogram shows thickening of aortic valve with AR, TEE is recommended Cardiology consultation placed for TEE  Progressive hyperglycemia Hyponatremia No previous history of diabetes but Hg A1c checked on admission was 6.3. Random blood sugars have increased from 164 to 412 today Anion gap is 11, bicarb is 23, no evidence of DKA Patient had a moment of full sugar lemonade at bedside which will need to be removed Will start patient on 1 L NS Will start patient on SSI Change full liquid diet to carb modified full liquids  Vertebral OM at T6-T9 on MRI Patient seen by neurosurgery, no surgical intervention required Continue antibiotics as above  Possible first MPJ septic arthritis Appreciate podiatry consultation note likely first MPJ resection with washout and antibiotic spacer if patient's not transferred Patient will not be transferred, secure chat sent to Dr. Malvin Continue antibiotics as above  Ongoing alcohol use Patient continues on CIWA protocol Previous history of complicated withdrawal per patient Patient somewhat tremulous this morning, with follow-up closely  Hypokalemia Hypophosphatemia Hypomagnesemia Will replete potassium and recheck Phosphate magnesium  have been adequately repleted  HTN Metoprolol  is on hold, this can be restarted  if blood pressure rebounds consistently  Focal small bowel intussusception seen on imaging Seen by general surgery who feel this is incidental and that there is no clinical evidence of bowel obstruction or ischemia Patient apparently had reactive ileus and constipation which was treated and resolved with MiraLAX  Repeat CT showed the same Recommendation is to follow-up CT in short interval to exclude a neoplastic lesio   DVT prophylaxis: Lovenox  Code Status: Full Family Communication: None     Studies: MR FOOT LEFT W WO CONTRAST Result Date: 10/25/2023 CLINICAL DATA:  Osteomyelitis suspected, foot, xray done follow up xray. Bacteremia EXAM: MRI OF THE LEFT FOREFOOT WITHOUT AND WITH CONTRAST TECHNIQUE: Multiplanar, multisequence MR imaging of the left forefoot was performed both before and after administration of intravenous contrast. CONTRAST:  5mL GADAVIST  GADOBUTROL  1 MMOL/ML IV SOLN COMPARISON:  X-ray 10/24/2023 FINDINGS: Technical Note: Despite efforts by the technologist and patient, motion artifact is present on today's exam and could not be eliminated. This reduces exam sensitivity and specificity. Bones/Joint/Cartilage Large  complex first MTP joint effusion with synovitis. Erosive changes of the base of the great toe proximal phalanx and of the first metatarsal head. Probable erosion of the tibial hallux sesamoid. Mild marrow edema within the fibular hallux sesamoid. No additional sites of bone marrow signal abnormality. No fracture or dislocation. Ligaments Intact Lisfranc ligament. First MTP joint capsule is distended. No evidence of an acute collateral ligament injury. Muscles and Tendons Mild flexor hallucis longus tenosynovitis. Mild intramuscular edema within the forefoot, likely myositis. Soft tissues Large fluid collection within the deep plantar soft tissues of the forefoot located at the level of the second metatarsal diaphysis and measuring approximately 4.2 x 1.7 x 1.8 cm. This  may be contiguous with the large first MTP joint effusion. Prominent subcutaneous edema of the dorsal foot. No additional organized fluid collections. IMPRESSION: 1. Large complex first MTP joint effusion with synovitis and erosive changes of the base of the great toe proximal phalanx, first metatarsal head, and hallux sesamoids. Findings are most suspicious for septic arthritis and osteomyelitis. Changes related to a inflammatory or crystalline arthropathy are felt to be less likely. 2. Large fluid collection within the deep plantar soft tissues of the forefoot located at the level of the second metatarsal diaphysis and measuring approximately 4.2 x 1.7 x 1.8 cm, most compatible with abscess. This may be contiguous with the large first MTP joint effusion. 3. Mild flexor hallucis longus tenosynovitis. 4. Prominent subcutaneous edema of the dorsal foot. Electronically Signed   By: Mabel Converse D.O.   On: 10/25/2023 10:39   DG Foot 2 Views Left Result Date: 10/24/2023 CLINICAL DATA:  Great toe red and warm to touch EXAM: LEFT FOOT - 2 VIEW COMPARISON:  None Available. FINDINGS: Heterogeneous sclerosis and lucency involving base of first proximal phalanx and head of first metatarsal with slightly irregular arthropathy. Diffuse soft tissue swelling. IMPRESSION: Heterogeneous sclerosis and lucency involving base of first proximal phalanx and head of first metatarsal with slightly irregular arthropathy. Findings are suspicious for osteomyelitis/septic arthritis. MRI is recommended for further assessment. Electronically Signed   By: Luke Bun M.D.   On: 10/24/2023 23:22   ECHOCARDIOGRAM COMPLETE Result Date: 10/24/2023    ECHOCARDIOGRAM REPORT   Patient Name:   ZENOVIA JUSTMAN Taussig Date of Exam: 10/24/2023 Medical Rec #:  [id number]       Height:       60.0 in Accession #:    [id number]      Weight:       120.0 lb Date of Birth:  [birth year]       BSA:          1.502 m Patient Age:    47 years        BP:            124/106 mmHg Patient Gender: F               HR:           115 bpm. Exam Location:  [facility] Procedure: 2D Echo, Cardiac Doppler and Color Doppler Indications:     Bacteremia  History:         Patient has no prior history of Echocardiogram examinations.                  Signs/Symptoms:Bacteremia.  Sonographer:     Naomie Reef Referring Phys:  [id number] ELLOUISE LAI Diagnosing Phys: Lonni Hanson MD  Sonographer Comments: Technically difficult study due to poor echo windows and suboptimal parasternal window.  Image acquisition challenging due to respiratory motion. IMPRESSIONS  1. Left ventricular ejection fraction, by estimation, is 60 to 65%. The left ventricle has normal function. The left ventricle has no regional wall motion abnormalities. Left ventricular diastolic parameters were normal.  2. Right ventricular systolic function is normal. The right ventricular size is normal. Tricuspid regurgitation signal is inadequate for assessing PA pressure.  3. The mitral valve is normal in structure. No evidence of mitral valve regurgitation. No evidence of mitral stenosis.  4. The aortic valve was not well visualized. Aortic valve regurgitation is mild. No aortic stenosis is present.  5. The inferior vena cava is normal in size with greater than 50% respiratory variability, suggesting right atrial pressure of 3 mmHg. Conclusion(s)/Recommendation(s): There is irregular thickening of the aortic valve seen in the apical 3 chamber view. Coupled with mild aortic regurgitation and bacteremia, TEE is recommended to evaluate for infective endocarditis. FINDINGS  Left Ventricle: Left ventricular ejection fraction, by estimation, is 60 to 65%. The left ventricle has normal function. The left ventricle has no regional wall motion abnormalities. The left ventricular internal cavity size was normal in size. There is  no left ventricular hypertrophy. Left ventricular diastolic parameters were normal. Right Ventricle: The right  ventricular size is normal. No increase in right ventricular wall thickness. Right ventricular systolic function is normal. Tricuspid regurgitation signal is inadequate for assessing PA pressure. Left Atrium: Left atrial size was normal in size. Right Atrium: Right atrial size was normal in size. Pericardium: There is no evidence of pericardial effusion. Mitral Valve: The mitral valve is normal in structure. No evidence of mitral valve regurgitation. No evidence of mitral valve stenosis. MV peak gradient, 4.8 mmHg. The mean mitral valve gradient is 3.0 mmHg. Tricuspid Valve: The tricuspid valve is normal in structure. Tricuspid valve regurgitation is trivial. Aortic Valve: The aortic valve was not well visualized. Aortic valve regurgitation is mild. No aortic stenosis is present. Aortic valve mean gradient measures 5.0 mmHg. Aortic valve peak gradient measures 8.3 mmHg. Aortic valve area, by VTI measures 2.64  cm. Pulmonic Valve: The pulmonic valve was grossly normal. Pulmonic valve regurgitation is not visualized. No evidence of pulmonic stenosis. Aorta: The aortic root is normal in size and structure. Pulmonary Artery: The pulmonary artery is not well seen. Venous: The inferior vena cava is normal in size with greater than 50% respiratory variability, suggesting right atrial pressure of 3 mmHg. IAS/Shunts: No atrial level shunt detected by color flow Doppler.  LEFT VENTRICLE PLAX 2D LVIDd:         4.30 cm   Diastology LVIDs:         2.80 cm   LV e' medial:    12.40 cm/s LV PW:         1.00 cm   LV E/e' medial:  7.3 LV IVS:        0.80 cm   LV e' lateral:   13.45 cm/s LVOT diam:     2.00 cm   LV E/e' lateral: 6.7 LV SV:         70 LV SV Index:   46 LVOT Area:     3.14 cm  RIGHT VENTRICLE RV Basal diam:  3.60 cm RV Mid diam:    3.20 cm RV S prime:     16.50 cm/s TAPSE (M-mode): 2.4 cm LEFT ATRIUM             Index        RIGHT ATRIUM  Index LA diam:        2.50 cm 1.66 cm/m   RA Area:     12.00 cm LA  Vol (A2C):   40.4 ml 26.89 ml/m  RA Volume:   29.50 ml  19.64 ml/m LA Vol (A4C):   29.9 ml 19.90 ml/m LA Biplane Vol: 38.5 ml 25.63 ml/m  AORTIC VALVE                    PULMONIC VALVE AV Area (Vmax):    2.75 cm     PV Vmax:       1.04 m/s AV Area (Vmean):   2.48 cm     PV Peak grad:  4.3 mmHg AV Area (VTI):     2.64 cm AV Vmax:           144.00 cm/s AV Vmean:          94.400 cm/s AV VTI:            0.264 m AV Peak Grad:      8.3 mmHg AV Mean Grad:      5.0 mmHg LVOT Vmax:         126.00 cm/s LVOT Vmean:        74.400 cm/s LVOT VTI:          0.222 m LVOT/AV VTI ratio: 0.84  AORTA Ao Root diam: 3.10 cm MITRAL VALVE MV Area (PHT): 6.12 cm    SHUNTS MV Area VTI:   2.97 cm    Systemic VTI:  0.22 m MV Peak grad:  4.8 mmHg    Systemic Diam: 2.00 cm MV Mean grad:  3.0 mmHg MV Vmax:       1.09 m/s MV Vmean:      76.9 cm/s MV Decel Time: 124 msec MV E velocity: 90.10 cm/s MV A velocity: 83.80 cm/s MV E/A ratio:  1.08 Lonni End MD Electronically signed by Lonni Hanson MD Signature Date/Time: 10/24/2023/4:56:45 PM    Final     Principal Problem:   Acute pancreatitis Active Problems:   Small bowel intussusception (HCC)   Pancreatitis   Septic arthritis of left foot (HCC)     Jenetta Wease Vangie Pike, [facility]  If 7PM-7AM, please contact night-coverage [email or web address]   LOS: 8 days

## 2023-10-26 NOTE — Consult Note (Signed)
 Pharmacy Antibiotic Note  ASSESSMENT: 48 y.o. female with PMH including asthma, EtOH misuse, hx EtOH pancreatitis is presenting with  MRSA bacteremia . 4 out of 4 bottles positive for MRSA.  Fevers started 1/8. Currently receiving Zosyn  due to acute pancreatitis with concern for early necrosis. Discussed with MD and source of bacteremia is unknown at this point however will obtain an MRI of spine because patient was complaining of severe back pain. Pharmacy has been consulted to manage vancomycin  dosing.  Today, 10/26/2023 Day #3 vancomycin   Renal: SCr 0.52 - slight decrease from 1/10- may not be significant but need to monitor closely.   WBC elevated at 12 Tmax 103 > 100.5 1/9 blood cx: 4/4 GPC, S aureus 1/9 MRI - concern for T6-9 discitis   PLAN: Adjust vancomycin  to 750mg  IV q12h to 1250 mg q24H. Predicted AUC 500. Goal AUC 400-600. TBW (not IBW) for Ke/CrCl as this calculated CrCl makes more sense for patients age , Vd 0.72, Scr 0.8. Plan to obtain vancomycin  level after 4th or 5th dose.   Continue cefepime  2 g q8H   Continue flagyl  500 mg BID  Patient measurements: Height: 5' (152.4 cm) Weight: 53.3 kg (117 lb 8 oz) IBW/kg (Calculated) : 45.5  Vital signs: Temp: 98.4 F (36.9 C) (01/12 1213) Temp Source: Oral (01/12 1213) BP: 145/86 (01/12 1213) Pulse Rate: 110 (01/12 1213) Recent Labs  Lab 10/24/23 0530 10/25/23 0444 10/26/23 0819  WBC 9.4 12.0* 11.0*  CREATININE 0.74 0.63 0.52   Estimated Creatinine Clearance: 62.4 mL/min (by C-G formula based on SCr of 0.52 mg/dL).  Allergies: Allergies  Allergen Reactions   Diclofenac Hives    Antimicrobials this admission: Zosyn  1/9 >>  Vancomycin  1/9 >>  Dose adjustments this admission: N/A  Microbiology results: 1/9 BCx: 4/4, MRSA  Thank you for allowing pharmacy to be a part of this patient's care.  Lum Mania, PharmD 10/26/2023 12:37 PM

## 2023-10-26 NOTE — Plan of Care (Signed)
   Problem: Education: Goal: Ability to describe self-care measures that may prevent or decrease complications (Diabetes Survival Skills Education) will improve Outcome: Progressing Goal: Individualized Educational Video(s) Outcome: Progressing

## 2023-10-26 NOTE — Plan of Care (Signed)

## 2023-10-26 NOTE — Consult Note (Signed)
 PODIATRY CONSULTATION  NAME Danielle Harrison MRN [id number] DOB 1976-04-05 DOA 10/18/2023   Reason for consult:  Chief Complaint  Patient presents with   Abdominal Pain    Attending/Consulting physician: CHARM Sor MD  History of present illness: Danielle Harrison is a 48 y.o. female with medical history Anxiety, Asthma, HLD, HTN restless leg syndrome, etoh abuse, hx of etoh pancreatitis who presents to ED with n/v/ severe abdominal pain x 48 hours.  She notes  pain mid to lower abdomen and radiates towards her back. She note no fever /chills/ chest pain or sob. Patient also noted normal bowel movements, no diarrhea or constipation. Notes n/v improved s/p treatment in ED. Currently pain is a 8/10. Patient states  she  last drank 4 days ago. She denies any history of severe withdrawal   Talked with pt this AM. She reports no issues whatsoever with her left foot prior to admission. Says foot pain and swelling started a few days ago. Feels like there is fluid around 1st MPJ. Denies N/V/F/C. Does report on and off abdominal pain to me this AM and states she feels swollen in abdomen. Also reports significant back pain.   Past Medical History:  Diagnosis Date   Anxiety    Arthritis    back   Asthma    Neuromuscular disorder (HCC)    weakness and numbness    Restless leg syndrome        Latest Ref Rng & Units 10/25/2023    4:44 AM 10/24/2023    5:30 AM 10/23/2023    6:36 AM  CBC  WBC 4.0 - 10.5 K/uL 12.0  9.4  9.3   Hemoglobin 12.0 - 15.0 g/dL 9.5  89.9  89.3   Hematocrit 36.0 - 46.0 % 27.2  29.4  31.3   Platelets 150 - 400 K/uL 183  201  227        Latest Ref Rng & Units 10/25/2023    4:44 AM 10/24/2023    5:30 AM 10/23/2023    6:36 AM  BMP  Glucose 70 - 99 mg/dL 702  755  740   BUN 6 - 20 mg/dL 5  6  6    Creatinine 0.44 - 1.00 mg/dL 9.36  9.25  9.37   Sodium 135 - 145 mmol/L 126  125  126   Potassium 3.5 - 5.1 mmol/L 3.3  3.5  4.1   Chloride 98 - 111 mmol/L 95  93  91   CO2 22  - 32 mmol/L 23  21  23    Calcium 8.9 - 10.3 mg/dL 9.0  8.8  8.8       Physical Exam: Lower Extremity Exam Vasc:   L - PT 2+ palpable, DP 2+ palpable. Cap refill <3 sec to digits  Derm:   L - No open wounds. Erythema and edema with palpable fluid collection vs edema about the 1st MPJ worst plantar  MSK:  R -    L - Edema to the left foot 2+  Neuro:    L - Gross sensation diminiished. Gross motor function intact    ASSESSMENT/PLAN OF CARE 48 y.o. 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   WBC 12 (11) Tmax 100.5 now AF, Tachycardia, tachypnea MRI L foot: 1st MPJ effusion with synovitis and erosive changes 1st met head and  base of prox phalanx. OM and septic arthritis suspected  - Appears patient has been recommended for transfer to a tertiary care center per surgery. If this is the plan for her to go today or tomorrow then I plan to defer surgical management of the left foot to specialists where she is transferred.  - otherwise recommend 1st MPJ resection with washout and antibiotic spacer. This would occur tomorrow if no transfer is planned today/tomorrow.  - the foot is NOT the source of her infection given timeline and clinical presentation (no wound) - therefore she will need aggressive treatment of infectious source for control. If this cannot be accomplished here at [facility] I recommend transfer - Continue IV abx per ID recs - Anticoagulation: per primary - Wound care: None required - WB status: WBAT to left foot - Will continue to follow   Thank you for the consult.  Please contact me directly with any questions or concerns.           Danielle Harrison, DPM [facility] / [facility]    [address] [place], [place] [address]                Office [phone]   Fax [phone]

## 2023-10-26 NOTE — Progress Notes (Signed)
 Discussed CIWA amd pt symptoms with MD Horatio Pel. No new orders.

## 2023-10-26 NOTE — Progress Notes (Signed)
 Subjective:  CC: Danielle Harrison is a 48 y.o. female  Hospital stay day 8,   Pancreatitis, now with concerns of progressing to necrotic pancreatitis, along with MRSA bacteremia and possible spinal osteomyelitis as well. MRSA source possibly from the pancreatitis?   HPI: Worsening back pain reported today.  Tolerating full liquids for now.  ROS:  General: Denies weight loss, weight gain, fatigue, fevers, chills, and night sweats. Heart: Denies chest pain, palpitations, racing heart, irregular heartbeat, leg pain or swelling, and decreased activity tolerance. Respiratory: Denies breathing difficulty, shortness of breath, wheezing, cough, and sputum. GI: Denies change in appetite, heartburn, nausea, vomiting, constipation, diarrhea, and blood in stool. GU: Denies difficulty urinating, pain with urinating, urgency, frequency, blood in urine.   Objective:   Temp:  [98 F (36.7 C)-100.5 F (38.1 C)] 98.6 F (37 C) (01/12 0710) Pulse Rate:  [100-130] 106 (01/12 0710) Resp:  [18-24] 20 (01/12 0710) BP: (100-143)/(74-98) 115/78 (01/12 0710) SpO2:  [96 %-100 %] 100 % (01/12 0710) Weight:  [53.3 kg-55.6 kg] 53.3 kg (01/12 0607)     Height: 5' (152.4 cm) Weight: 53.3 kg BMI (Calculated): 22.95   Intake/Output this shift:   Intake/Output Summary (Last 24 hours) at 10/26/2023 1100 Last data filed at 10/26/2023 0615 Gross per 24 hour  Intake 1050 ml  Output --  Net 1050 ml    Constitutional :  alert, cooperative, appears stated age, and no distress  Respiratory:  clear to auscultation bilaterally  Cardiovascular:  Tachycardia rate  Gastrointestinal: Soft, no guarding, decreased tenderness to palpation epigastric region .   Skin: Cool and moist.  Increased swelling in left foot with erythema and warmth  Psychiatric: Normal affect, non-agitated, not confused       LABS:     Latest Ref Rng & Units 10/26/2023    8:19 AM 10/25/2023    4:44 AM 10/24/2023    5:30 AM  CMP  Glucose 70 - 99  mg/dL 587  702  755   BUN 6 - 20 mg/dL <5  <5  6   Creatinine 0.44 - 1.00 mg/dL 9.47  9.36  9.25   Sodium 135 - 145 mmol/L 127  126  125   Potassium 3.5 - 5.1 mmol/L 2.7  3.3  3.5   Chloride 98 - 111 mmol/L 93  95  93   CO2 22 - 32 mmol/L 23  23  21    Calcium 8.9 - 10.3 mg/dL 8.6  9.0  8.8       Latest Ref Rng & Units 10/26/2023    8:19 AM 10/25/2023    4:44 AM 10/24/2023    5:30 AM  CBC  WBC 4.0 - 10.5 K/uL 11.0  12.0  9.4   Hemoglobin 12.0 - 15.0 g/dL 9.4  9.5  89.9   Hematocrit 36.0 - 46.0 % 27.7  27.2  29.4   Platelets 150 - 400 K/uL 211  183  201     RADS: N/a Assessment:  Pancreatitis, now with concerns of progressing to necrotic pancreatitis, along with MRSA bacteremia and possible spinal osteomyelitis as well. MRSA source possibly from the pancreatitis?   Patient now has white count but has decreased past 24 hours.  Continue present management from surgical standpoint.  Still recommend second opinion from tertiary care center regarding monitoring possible intervention for her pancreatitis.  Further care per primary team regarding MRSA bacteremia and possible osteomyelitis    labs/images/medications/previous chart entries reviewed personally and relevant changes/updates noted above.

## 2023-10-26 NOTE — Progress Notes (Signed)
 Informed Consent   Shared Decision Making/Informed Consent   The risks [esophageal damage, perforation (1:10,000 risk), bleeding, pharyngeal hematoma as well as other potential complications associated with conscious sedation including aspiration, arrhythmia, respiratory failure and death], benefits (treatment guidance and diagnostic support) and alternatives of a transesophageal echocardiogram were discussed in detail with Ms. Egge and she is willing to proceed. We will discuss w/ scheduling team in the AM but hope to have her scheduled for 1/13 @ 12:30 w/ Dr. Darliss.  Lonni Meager, NP 10/26/2023 1:25 PM

## 2023-10-27 ENCOUNTER — Encounter
Admission: EM | Disposition: A | Discharge status: Home Health | Payer: Self-pay | Source: Home / Self Care | Attending: Student

## 2023-10-27 ENCOUNTER — Other Ambulatory Visit: Payer: Self-pay

## 2023-10-27 ENCOUNTER — Encounter: Payer: Self-pay | Admitting: Osteopathic Medicine

## 2023-10-27 ENCOUNTER — Inpatient Hospital Stay: Payer: Medicaid Other | Admitting: Registered Nurse

## 2023-10-27 DIAGNOSIS — R7881 Bacteremia: Secondary | ICD-10-CM | POA: Diagnosis not present

## 2023-10-27 DIAGNOSIS — M00072 Staphylococcal arthritis, left ankle and foot: Secondary | ICD-10-CM | POA: Diagnosis not present

## 2023-10-27 DIAGNOSIS — M4624 Osteomyelitis of vertebra, thoracic region: Secondary | ICD-10-CM | POA: Diagnosis not present

## 2023-10-27 DIAGNOSIS — L02612 Cutaneous abscess of left foot: Secondary | ICD-10-CM

## 2023-10-27 DIAGNOSIS — M86072 Acute hematogenous osteomyelitis, left ankle and foot: Secondary | ICD-10-CM

## 2023-10-27 DIAGNOSIS — B9562 Methicillin resistant Staphylococcus aureus infection as the cause of diseases classified elsewhere: Secondary | ICD-10-CM

## 2023-10-27 DIAGNOSIS — K859 Acute pancreatitis without necrosis or infection, unspecified: Secondary | ICD-10-CM | POA: Diagnosis not present

## 2023-10-27 HISTORY — DX: Cutaneous abscess of left foot: L02.612

## 2023-10-27 HISTORY — PX: TRANSMETATARSAL AMPUTATION: SHX6197

## 2023-10-27 HISTORY — PX: INCISION AND DRAINAGE: SHX5863

## 2023-10-27 LAB — GLUCOSE, CAPILLARY
Glucose-Capillary: 190 mg/dL — ABNORMAL HIGH (ref 70–99)
Glucose-Capillary: 173 mg/dL — ABNORMAL HIGH (ref 70–99)
Glucose-Capillary: 206 mg/dL — ABNORMAL HIGH (ref 70–99)
Glucose-Capillary: 497 mg/dL — ABNORMAL HIGH (ref 70–99)
Glucose-Capillary: 568 mg/dL (ref 70–99)

## 2023-10-27 LAB — BASIC METABOLIC PANEL
Anion gap: 15 (ref 5–15)
BUN: 5 mg/dL — ABNORMAL LOW (ref 6–20)
CO2: 23 mmol/L (ref 22–32)
Calcium: 8.9 mg/dL (ref 8.9–10.3)
Chloride: 93 mmol/L — ABNORMAL LOW (ref 98–111)
Creatinine, Ser: 0.4 mg/dL — ABNORMAL LOW (ref 0.44–1.00)
GFR, Estimated: >60 mL/min (ref >60)
Glucose, Bld: 200 mg/dL — ABNORMAL HIGH (ref 70–99)
Potassium: 3.4 mmol/L — ABNORMAL LOW (ref 3.5–5.1)
Sodium: 131 mmol/L — ABNORMAL LOW (ref 135–145)

## 2023-10-27 LAB — SYNOVIAL CELL COUNT + DIFF, W/ CRYSTALS
Crystals, Fluid: NONE SEEN
WBC, Synovial: UNDETERMINED /mm3 (ref 0–200)

## 2023-10-27 LAB — CULTURE, BLOOD (ROUTINE X 2)

## 2023-10-27 LAB — CBC
HCT: 31.2 % — ABNORMAL LOW (ref 36.0–46.0)
Hemoglobin: 10.9 g/dL — ABNORMAL LOW (ref 12.0–15.0)
MCH: 33.2 pg (ref 26.0–34.0)
MCHC: 34.9 g/dL (ref 30.0–36.0)
MCV: 95.1 fL (ref 80.0–100.0)
Platelets: 278 10*3/uL (ref 150–400)
RBC: 3.28 MIL/uL — ABNORMAL LOW (ref 3.87–5.11)
RDW: 12.7 % (ref 11.5–15.5)
WBC: 16.4 10*3/uL — ABNORMAL HIGH (ref 4.0–10.5)
nRBC: 0 % (ref 0.0–0.2)

## 2023-10-27 LAB — VANCOMYCIN, PEAK
Vancomycin Pk: 4 ug/mL — ABNORMAL LOW (ref 30–40)
Vancomycin Pk: <4 ug/mL — ABNORMAL LOW (ref 30–40)

## 2023-10-27 LAB — POCT PREGNANCY, URINE: Preg Test, Ur: NEGATIVE

## 2023-10-27 LAB — PHOSPHORUS: Phosphorus: 3 mg/dL (ref 2.5–4.6)

## 2023-10-27 LAB — CK: Total CK: 35 U/L — ABNORMAL LOW (ref 38–234)

## 2023-10-27 LAB — MAGNESIUM: Magnesium: 1.8 mg/dL (ref 1.7–2.4)

## 2023-10-27 SURGERY — AMPUTATION, FOOT, TRANSMETATARSAL
Anesthesia: General | Site: Toe | Laterality: Left

## 2023-10-27 MED ORDER — DIPHENHYDRAMINE HCL 50 MG/ML IJ SOLN
INTRAMUSCULAR | Status: DC | PRN
  Administered 2023-10-27: 12.5 mg via INTRAVENOUS

## 2023-10-27 MED ORDER — ONDANSETRON HCL 4 MG/2ML IJ SOLN
INTRAMUSCULAR | Status: AC
Start: 2023-10-27 — End: ?
  Filled 2023-10-27: qty 2

## 2023-10-27 MED ORDER — PROPOFOL 10 MG/ML IV BOLUS
INTRAVENOUS | Status: AC
  Filled 2023-10-27: qty 20

## 2023-10-27 MED ORDER — POTASSIUM CHLORIDE CRYS ER 20 MEQ PO TBCR
40.0000 meq | EXTENDED_RELEASE_TABLET | Freq: Once | ORAL | Status: DC
Start: 2023-10-27 — End: 2023-10-29

## 2023-10-27 MED ORDER — LIDOCAINE HCL (PF) 1 % IJ SOLN
INTRAMUSCULAR | Status: AC
  Filled 2023-10-27: qty 30

## 2023-10-27 MED ORDER — DEXAMETHASONE SODIUM PHOSPHATE 10 MG/ML IJ SOLN
INTRAMUSCULAR | Status: DC | PRN
  Administered 2023-10-27: 4 mg via INTRAVENOUS

## 2023-10-27 MED ORDER — DAPTOMYCIN-SODIUM CHLORIDE 500-0.9 MG/50ML-% IV SOLN
500.0000 mg | Freq: Every day | INTRAVENOUS | Status: DC
  Administered 2023-10-27 – 2023-11-04 (×9): 500 mg via INTRAVENOUS
  Filled 2023-10-27 (×9): qty 50

## 2023-10-27 MED ORDER — BUPIVACAINE HCL 0.5 % IJ SOLN
INTRAMUSCULAR | Status: DC | PRN
  Administered 2023-10-27: 5 mL

## 2023-10-27 MED ORDER — MIDAZOLAM HCL 2 MG/2ML IJ SOLN
INTRAMUSCULAR | Status: AC
  Filled 2023-10-27: qty 2

## 2023-10-27 MED ORDER — LIDOCAINE HCL (PF) 2 % IJ SOLN
INTRAMUSCULAR | Status: AC
Start: 2023-10-27 — End: ?
  Filled 2023-10-27: qty 5

## 2023-10-27 MED ORDER — LACTATED RINGERS IV SOLN: INTRAVENOUS | Status: DC | PRN

## 2023-10-27 MED ORDER — LIDOCAINE HCL 1 % IJ SOLN
INTRAMUSCULAR | Status: DC | PRN
  Administered 2023-10-27: 5 mL

## 2023-10-27 MED ORDER — DEXMEDETOMIDINE HCL IN NACL 80 MCG/20ML IV SOLN
INTRAVENOUS | Status: DC | PRN
  Administered 2023-10-27: 4 ug via INTRAVENOUS
  Administered 2023-10-27 (×2): 8 ug via INTRAVENOUS

## 2023-10-27 MED ORDER — FENTANYL CITRATE (PF) 100 MCG/2ML IJ SOLN
INTRAMUSCULAR | Status: AC
  Filled 2023-10-27: qty 2

## 2023-10-27 MED ORDER — PROPOFOL 500 MG/50ML IV EMUL
INTRAVENOUS | Status: DC | PRN
  Administered 2023-10-27: 50 ug/kg/min via INTRAVENOUS
  Administered 2023-10-27: 50 ug via INTRAVENOUS

## 2023-10-27 MED ORDER — TOBRAMYCIN SULFATE 80 MG/2ML IJ SOLN
40.0000 mg | Freq: Once | INTRAMUSCULAR | Status: DC
  Filled 2023-10-27: qty 1

## 2023-10-27 MED ORDER — VANCOMYCIN HCL 1000 MG IV SOLR
INTRAVENOUS | Status: AC
  Filled 2023-10-27: qty 20

## 2023-10-27 MED ORDER — DIPHENHYDRAMINE HCL 50 MG/ML IJ SOLN
INTRAMUSCULAR | Status: AC
  Filled 2023-10-27: qty 1

## 2023-10-27 MED ORDER — BUPIVACAINE HCL (PF) 0.5 % IJ SOLN
INTRAMUSCULAR | Status: AC
  Filled 2023-10-27: qty 30

## 2023-10-27 MED ORDER — DEXAMETHASONE SODIUM PHOSPHATE 10 MG/ML IJ SOLN
INTRAMUSCULAR | Status: AC
  Filled 2023-10-27: qty 1

## 2023-10-27 MED ORDER — 0.9 % SODIUM CHLORIDE (POUR BTL) OPTIME
TOPICAL | Status: DC | PRN
  Administered 2023-10-27: 1000 mL

## 2023-10-27 MED ORDER — INSULIN ASPART 100 UNIT/ML IJ SOLN
10.0000 [IU] | Freq: Once | INTRAMUSCULAR | Status: AC
  Administered 2023-10-27: 10 [IU] via SUBCUTANEOUS
  Filled 2023-10-27: qty 1

## 2023-10-27 MED ORDER — MIDAZOLAM HCL 2 MG/2ML IJ SOLN
INTRAMUSCULAR | Status: DC | PRN
  Administered 2023-10-27 (×4): 2 mg via INTRAVENOUS

## 2023-10-27 MED ORDER — METOPROLOL TARTRATE 50 MG PO TABS
50.0000 mg | ORAL_TABLET | Freq: Two times a day (BID) | ORAL | Status: DC
Start: 2023-10-27 — End: 2023-11-02
  Administered 2023-10-27 – 2023-11-01 (×12): 50 mg via ORAL
  Filled 2023-10-27 (×12): qty 1

## 2023-10-27 MED ORDER — MIDAZOLAM HCL 2 MG/2ML IJ SOLN
INTRAMUSCULAR | Status: AC
Start: 2023-10-27 — End: ?
  Filled 2023-10-27: qty 2

## 2023-10-27 SURGICAL SUPPLY — 70 items
BLADE MED AGGRESSIVE (BLADE) ×2 IMPLANT
BLADE OSC/SAGITTAL MD 5.5X18 (BLADE) IMPLANT
BLADE SURG 15 STRL LF DISP TIS (BLADE) ×4 IMPLANT
BLADE SURG MINI STRL (BLADE) ×2 IMPLANT
BNDG COHESIVE 4X5 TAN STRL LF (GAUZE/BANDAGES/DRESSINGS) ×2 IMPLANT
BNDG COHESIVE 6X5 TAN ST LF (GAUZE/BANDAGES/DRESSINGS) ×2 IMPLANT
BNDG ELASTIC 4INX 5YD STR LF (GAUZE/BANDAGES/DRESSINGS) ×2 IMPLANT
BNDG ESMARCH 4X12 STRL LF (GAUZE/BANDAGES/DRESSINGS) ×2 IMPLANT
BNDG GAUZE DERMACEA FLUFF 4 (GAUZE/BANDAGES/DRESSINGS) ×2 IMPLANT
BNDG STRETCH GAUZE 3IN X12FT (GAUZE/BANDAGES/DRESSINGS) ×2 IMPLANT
CNTNR URN SCR LID CUP LEK RST (MISCELLANEOUS) IMPLANT
CUFF TOURN SGL QUICK 12 (TOURNIQUET CUFF) IMPLANT
CUFF TOURN SGL QUICK 18X4 (TOURNIQUET CUFF) IMPLANT
CUFF TRNQT CYL 24X4X16.5-23 (TOURNIQUET CUFF) IMPLANT
DRAPE FLUOR MINI C-ARM 54X84 (DRAPES) IMPLANT
DRSG ADAPTIC 3X8 NADH LF (GAUZE/BANDAGES/DRESSINGS) ×2 IMPLANT
DRSG EMULSION OIL 3X3 NADH (GAUZE/BANDAGES/DRESSINGS) IMPLANT
DRSG EMULSION OIL 3X8 NADH (GAUZE/BANDAGES/DRESSINGS) ×2 IMPLANT
DRSG VAC GRANUFOAM MED (GAUZE/BANDAGES/DRESSINGS) IMPLANT
DURAPREP 26ML APPLICATOR (WOUND CARE) ×2 IMPLANT
ELECT REM PT RETURN 9FT ADLT (ELECTROSURGICAL) ×2
ELECTRODE REM PT RTRN 9FT ADLT (ELECTROSURGICAL) ×2 IMPLANT
GAUZE PACKING 0.25INX5YD STRL (GAUZE/BANDAGES/DRESSINGS) IMPLANT
GAUZE PAD ABD 8X10 STRL (GAUZE/BANDAGES/DRESSINGS) ×2 IMPLANT
GAUZE SPONGE 4X4 12PLY STRL (GAUZE/BANDAGES/DRESSINGS) ×4 IMPLANT
GAUZE STRETCH 2X75IN STRL (MISCELLANEOUS) ×2 IMPLANT
GAUZE XEROFORM 1X8 LF (GAUZE/BANDAGES/DRESSINGS) ×2 IMPLANT
GLOVE BIO SURGEON STRL SZ7.5 (GLOVE) ×4 IMPLANT
GLOVE BIOGEL M STRL SZ7.5 (GLOVE) ×2 IMPLANT
GLOVE BIOGEL PI IND STRL 7.5 (GLOVE) ×2 IMPLANT
GLOVE BIOGEL PI IND STRL 8 (GLOVE) ×2 IMPLANT
GLOVE INDICATOR 7.5 STRL GRN (GLOVE) IMPLANT
GLOVE PI ORTHO PRO STRL 7.5 (GLOVE) ×2 IMPLANT
GLOVE SKINSENSE STRL SZ7.5 (GLOVE) ×2 IMPLANT
GOWN STRL REUS W/ TWL LRG LVL3 (GOWN DISPOSABLE) IMPLANT
GOWN STRL REUS W/ TWL XL LVL3 (GOWN DISPOSABLE) ×2 IMPLANT
GOWN STRL REUS W/TWL MED LVL3 (GOWN DISPOSABLE) ×2 IMPLANT
HANDPIECE VERSAJET DEBRIDEMENT (MISCELLANEOUS) IMPLANT
IV NS 1000ML BAXH (IV SOLUTION) ×2 IMPLANT
IV NS IRRIG 3000ML ARTHROMATIC (IV SOLUTION) IMPLANT
KIT STIMULAN RAPID CURE 5CC (Orthopedic Implant) IMPLANT
KIT TURNOVER KIT A (KITS) ×2 IMPLANT
LABEL OR SOLS (LABEL) ×2 IMPLANT
MANIFOLD NEPTUNE II (INSTRUMENTS) ×2 IMPLANT
NDL FILTER BLUNT 18X1 1/2 (NEEDLE) ×2 IMPLANT
NDL HYPO 25X1 1.5 SAFETY (NEEDLE) ×4 IMPLANT
NEEDLE FILTER BLUNT 18X1 1/2 (NEEDLE) ×2 IMPLANT
NEEDLE HYPO 25X1 1.5 SAFETY (NEEDLE) ×4 IMPLANT
NS IRRIG 500ML POUR BTL (IV SOLUTION) ×2 IMPLANT
PACK EXTREMITY ARMC (MISCELLANEOUS) ×2 IMPLANT
PACKING GAUZE IODOFORM 1INX5YD (GAUZE/BANDAGES/DRESSINGS) IMPLANT
PAD ABD DERMACEA PRESS 5X9 (GAUZE/BANDAGES/DRESSINGS) ×2 IMPLANT
PULSAVAC PLUS IRRIG FAN TIP (DISPOSABLE)
SOL PREP PVP 2OZ (MISCELLANEOUS) ×2
SOLUTION PREP PVP 2OZ (MISCELLANEOUS) ×2 IMPLANT
STAPLER SKIN PROX 35W (STAPLE) ×2 IMPLANT
STOCKINETTE IMPERVIOUS 9X36 MD (GAUZE/BANDAGES/DRESSINGS) ×2 IMPLANT
SUT ETHILON 2 0 FS 18 (SUTURE) IMPLANT
SUT ETHILON 4-0 FS2 18XMFL BLK (SUTURE)
SUT MNCRL AB 3-0 PS2 27 (SUTURE) ×2 IMPLANT
SUT PROLENE 3 0 PS 2 (SUTURE) ×2 IMPLANT
SUT VIC AB 3-0 SH 27X BRD (SUTURE) IMPLANT
SUT VIC AB 4-0 FS2 27 (SUTURE) IMPLANT
SUTURE ETHLN 4-0 FS2 18XMF BLK (SUTURE) IMPLANT
SWAB CULTURE AMIES ANAERIB BLU (MISCELLANEOUS) IMPLANT
SYR 10ML LL (SYRINGE) ×4 IMPLANT
TIP BRUSH PULSAVAC PLUS 24.33 (MISCELLANEOUS) IMPLANT
TIP FAN IRRIG PULSAVAC PLUS (DISPOSABLE) IMPLANT
TRAP FLUID SMOKE EVACUATOR (MISCELLANEOUS) ×2 IMPLANT
WATER STERILE IRR 500ML POUR (IV SOLUTION) ×2 IMPLANT

## 2023-10-27 NOTE — Progress Notes (Signed)
 [place] recommended continued supportive care for now.  Surgery will sign off for now.  Recommend continued supportive care per primary team as done for past few days.

## 2023-10-27 NOTE — Plan of Care (Signed)

## 2023-10-27 NOTE — Anesthesia Procedure Notes (Signed)
 Procedure Name: MAC Date/Time: 10/27/2023 12:56 PM  Performed by: Nelle Don, CRNAPre-anesthesia Checklist: Patient identified, Emergency Drugs available, Patient being monitored and Suction available Oxygen Delivery Method: Simple face mask

## 2023-10-27 NOTE — Progress Notes (Signed)
 MD Lucianne Muss notified of elevated BP and HR. See new order

## 2023-10-27 NOTE — TOC Progression Note (Signed)
 Transition of Care [facility]) - Progression Note    Patient Details  Name: Danielle Harrison MRN: [id number] Date of Birth: 11-19-1975  Transition of Care [facility] CM/SW Contact  Corean ONEIDA Haddock, RN Phone Number: 10/27/2023, 4:47 PM  Clinical Narrative:     TOC following for TOC needs.  If home IV antibiotics, wound care, or any other TOC needs arise please consult [facility]       Expected Discharge Plan and Services                                               Social Determinants of Health (SDOH) Interventions SDOH Screenings   Food Insecurity: No Food Insecurity (10/21/2023)  Housing: High Risk (10/21/2023)  Transportation Needs: No Transportation Needs (10/21/2023)  Utilities: Not At Risk (10/21/2023)  Tobacco Use: Medium Risk (10/27/2023)    Readmission Risk Interventions     No data to display

## 2023-10-27 NOTE — Progress Notes (Signed)
 PODIATRY PROGRESS NOTE Patient Name: Danielle Harrison  DOB 1976/03/23 DOA 10/18/2023  Hospital Day: 66  Assessment:  48 y.o. 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    WBC  16.4 (11) AF, Tachycardia, tachypnea MRI L foot: 1st MPJ effusion with synovitis and erosive changes 1st met head and base of prox phalanx. OM and septic arthritis suspected  Plan:  -4 OR this afternoon plan is left foot incision and drainage of abscess with first metatarsophalangeal joint resection possible antibiotic spacer and possible wound VAC -Continue to monitor leukocytosis closely in the postop phase.  If it improves would indicate that that was being driven primarily by the left foot if not would recommend considering other source of infection -Continue IV antibiotics per infectious disease recommendations appreciate -Recommend holding anticoagulation in the postop phase if possible due to risk of bleeding/wound VAC -Postoperative dressings remain intact -Nonweightbearing to the left lower extremity postoperatively Will continue to follow        Marolyn JULIANNA Honour, DPM [facility]    Subjective:  Discussed with patient preop area my recommendations for OR today.  Expressed that I am concerned regarding the abscess and possible septic arthritis and osteomyelitis of the first MPJ.  I do not feel it is safe to wait on intervening on this issue for her as it could result in worsening outcomes for her foot including risk of limb loss.  She is in agreement to proceed.  All questions were answered.  Objective:   Vitals:   10/27/23 1025 10/27/23 1223  BP: (!) 152/114 (!) 137/102  Pulse: (!) 119 (!) 110  Resp:  18  Temp:  98.7 F (37.1 C)  SpO2:  100%       Latest Ref Rng & Units 10/27/2023    4:35 AM 10/26/2023    8:19 AM  10/25/2023    4:44 AM  CBC  WBC 4.0 - 10.5 K/uL 16.4  11.0  12.0   Hemoglobin 12.0 - 15.0 g/dL 89.0  9.4  9.5   Hematocrit 36.0 - 46.0 % 31.2  27.7  27.2   Platelets 150 - 400 K/uL 278  211  183        Latest Ref Rng & Units 10/27/2023    4:35 AM 10/26/2023    3:44 PM 10/26/2023    8:19 AM  BMP  Glucose 70 - 99 mg/dL 799  599  587   BUN 6 - 20 mg/dL 5  <5  <5   Creatinine 0.44 - 1.00 mg/dL 9.59  <9.69  9.47   Sodium 135 - 145 mmol/L 131  125  127   Potassium 3.5 - 5.1 mmol/L 3.4  3.3  2.7   Chloride 98 - 111 mmol/L 93  92  93   CO2 22 - 32 mmol/L 23  24  23    Calcium 8.9 - 10.3 mg/dL 8.9  8.6  8.6     General: AAOx3, NAD  Lower Extremity Exam Lower Extremity Exam Vasc:                    L - PT 2+ palpable, DP 2+ palpable. Cap refill <3 sec to digits   Derm:                   L -  No open wounds. Erythema and edema with palpable fluid collection vs edema about the 1st MPJ worst plantar   MSK:     R -                 L - Edema to the left foot 2+   Neuro:                   L - Gross sensation diminiished. Gross motor function intact     Radiology:  Results reviewed. See assessment for pertinent imaging results

## 2023-10-27 NOTE — Progress Notes (Addendum)
 PROGRESS NOTE    CAMPBELL AGRAMONTE  FMW:[id number] DOB: 09-Nov-1975 DOA: 10/18/2023 PCP: [facility]  201A/201A-AA  LOS: 9 days   Brief hospital course: Danielle Harrison is a 48 y.o. female with medical history Anxiety, Asthma, HLD, HTN restless leg syndrome, etoh abuse, hx of etoh pancreatitis who presents to ED with n/v/ severe abdominal pain x 48 hours.  She notes  pain mid to lower abdomen and radiates towards her back. She note no fever /chills/ chest pain or sob. Patient also noted normal bowel movements, no diarrhea or constipation. Notes n/v improved s/p treatment in ED.  Patient was found to have alcoholic necrotic pancreatitis, MRSA bacteremia most likely due to osteomyelitis of T6-T9, left foot inflammation and left great toe septic arthritis.  ID consulted, patient was on vancomycin  and Zosyn , which was switched to cefepime  and Flagyl .    Assessment & Plan:  Acute ETOH pancreatitis  With developing necrosis --continued to have abdominal pain.  Started having fevers.  Repeat CT a/p showed finding concerning for developing necrosis.  No organized fluid collection. --abd pain improving and is tolerating full liquids -General Surgery following recommended transfer to tertiary care center.  [facility] and [facility] declined due to capacity.  General surgery signed off. On 1/12 case was discussed with Dr. Teresa at [facility], recommended conservative management and antibiotics, no need of surgical intervention at this time unless there is a large infected fluid collection. - s/p vanc, and Zosyn .  1/12 started cefepime  and Flagyl  As per ID patient will need 6 weeks of antibiotics   Sepsis due to MRSA bacteremia Hemodynamically stable. Patient denies hardware, hx ivdu, new piv yesterday S/p vanc and Zosyn , 1/12 started cefepime  and Flagyl . 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 Follow fluid culture ID following    # Focal small bowel intussusception in the left mid-abdomen  --Per [facility], likely incidental, No clinical evidence of bowel obstruction or ischemia. No surgical intervention is needed from that standpoint either.  --repeat CT a/p showed the same. Plan: --Short interval follow up CT is recommended to ensure resolution and exclude a neoplastic lead point.   Acute metabolic acidosis  Lactic acidosis --resolved after IVF   Hyponatremia, presumed due to pancreatitis --continue ivf   Hypokalemia, potassium repleted Hypophosphatemia, Phos repleted Monitor electrolytes and replete as needed  Hyperglycemia  Prediabetes --A1c 6.3   Hx of HTN --hold Toprol     HLD not taking medications due to loss of insurance Follow outpatient   ETOH  --no signs of withdrawal   Reactive ileus and constipation --resolved with aggressive Miralax     DVT prophylaxis: Lovenox  SQ Code Status: Full code  Family Communication:  Level of care: Med-Surg Dispo:   The patient is from: home Anticipated d/c is to: home Anticipated d/c date is: to be determined 1/11 declined by [facility] due to capacity  1/12 declined by [facility] and [facility] due to capacity.   Subjective and Interval History:  No significant events  overnight, patient complaining of severe back pain 10/10 and abdominal pain is 6/10, she does not have much pain in the left toe swelling. Patient is aware that she is scheduled for OR possible washout of left great toe.      Objective: Vitals:   10/27/23 1430 10/27/23 1445 10/27/23 1511 10/27/23 1522  BP: 110/79 111/77 (!) 127/90 (!) 128/92  Pulse: 92 94 97 97  Resp: (!) 28 (!) 30 (!) 24 20  Temp:  (!) 97.4 F (36.3 C) 98 F (36.7 C) 98.6 F (37 C)  TempSrc:   Oral Oral  SpO2: 95% 94% 97% 99%  Weight:      Height:        Intake/Output Summary (Last 24 hours) at 10/27/2023 1601 Last data filed at 10/27/2023 1357 Gross per 24 hour  Intake 2598.88 ml  Output 5 ml  Net 2593.88 ml   Filed Weights   10/25/23 1800 10/26/23 0607 10/27/23 0600  Weight: 55.6 kg 53.3 kg 53.6 kg    Examination:   Constitutional: NAD, AAOx3 HEENT: conjunctivae and lids normal, EOMI CV: No cyanosis.  Tachycardic, rr RESP: normal respiratory effort, on RA Neuro: II - XII grossly intact.   Msk: no spinous process tencerness. Mild swelling left first mtp LE: Right great toe swelling, erythema and tenderness  Data Reviewed: I have personally reviewed labs and imaging studies  CRITICAL CARE Performed by: Elvan Sor   Total critical care time: 55 minutes  Critical care time was exclusive of separately billable procedures and treating other patients.  Critical care was necessary to treat or prevent imminent or life-threatening deterioration.  Critical care was time spent personally by me on the following activities: development of treatment plan with patient and/or surrogate as well as nursing, discussions with consultants, evaluation of patient's response to treatment, examination of patient, obtaining history from patient or surrogate, ordering and performing treatments and interventions, ordering and review of laboratory studies, ordering and review of radiographic studies, pulse oximetry and re-evaluation of patient's condition.     Elvan Sor, MD [facility] If 7PM-7AM, please contact night-coverage 10/27/2023, 4:01 PM

## 2023-10-27 NOTE — Progress Notes (Signed)
 Date of Admission:  10/18/2023   T  ID: Danielle Harrison is a 48 y.o. female  Principal Problem:   Acute pancreatitis Active Problems:   Small bowel intussusception (HCC)   Pancreatitis   Septic arthritis of left foot (HCC)    Subjective: Pt had I/D of the left great toe abscess Sleeping now  Medications:   enoxaparin  (LOVENOX ) injection  40 mg Subcutaneous Q24H   feeding supplement  237 mL Oral BID BM   folic acid   1 mg Oral Daily   guaiFENesin   600 mg Oral BID   insulin  aspart  0-9 Units Subcutaneous TID WC   metoprolol  tartrate  50 mg Oral BID   multivitamin with minerals  1 tablet Oral Daily   potassium chloride   40 mEq Oral Once   thiamine   100 mg Oral Daily   Or   thiamine   100 mg Intravenous Daily    Objective: Vital signs in last 24 hours: Patient Vitals for the past 24 hrs:  BP Temp Temp src Pulse Resp SpO2 Weight  10/27/23 1025 (!) 152/114 -- -- (!) 119 -- -- --  10/27/23 0844 (!) 151/113 98.9 F (37.2 C) Oral (!) 119 -- 97 % --  10/27/23 0735 (!) 148/108 -- -- (!) 118 -- 99 % --  10/27/23 0734 (!) 148/106 98.1 F (36.7 C) -- (!) 118 18 98 % --  10/27/23 0600 -- -- -- -- -- -- 53.6 kg  10/27/23 0245 (!) 141/95 98.4 F (36.9 C) Oral (!) 110 19 96 % --  10/26/23 2320 (!) 157/105 98.3 F (36.8 C) Oral (!) 108 -- 100 % --  10/26/23 1912 (!) 160/101 98.2 F (36.8 C) Oral (!) 47 19 90 % --  10/26/23 1558 (!) 161/95 98.5 F (36.9 C) Oral (!) 112 17 99 % --  10/26/23 1420 (!) 165/98 98.7 F (37.1 C) Oral (!) 118 20 100 % --  10/26/23 1213 (!) 145/86 98.4 F (36.9 C) Oral (!) 110 20 100 % --      PHYSICAL EXAM:  General: sleeping Lungs: Clear to auscultation bilaterally. No Wheezing or Rhonchi. No rales. Heart: Regular rate and rhythm, no murmur, rub or gallop.  Extremities: rt foot dressing not removed Wound vac in place  Lab Results    Latest Ref Rng & Units 10/27/2023    4:35 AM 10/26/2023    8:19 AM 10/25/2023    4:44 AM  CBC  WBC 4.0 - 10.5  K/uL 16.4  11.0  12.0   Hemoglobin 12.0 - 15.0 g/dL 89.0  9.4  9.5   Hematocrit 36.0 - 46.0 % 31.2  27.7  27.2   Platelets 150 - 400 K/uL 278  211  183        Latest Ref Rng & Units 10/27/2023    4:35 AM 10/26/2023    3:44 PM 10/26/2023    8:19 AM  CMP  Glucose 70 - 99 mg/dL 799  599  587   BUN 6 - 20 mg/dL 5  <5  <5   Creatinine 0.44 - 1.00 mg/dL 9.59  <9.69  9.47   Sodium 135 - 145 mmol/L 131  125  127   Potassium 3.5 - 5.1 mmol/L 3.4  3.3  2.7   Chloride 98 - 111 mmol/L 93  92  93   CO2 22 - 32 mmol/L 23  24  23    Calcium 8.9 - 10.3 mg/dL 8.9  8.6  8.6  Microbiology: [facility] 10/23/23 MRSA bacteremia 4/4 10/25/23 MRSA bacteremia 4/4 1/12 BC- gram positive coccci Studies/Results: No results found.   Assessment/Plan: Acute pancreatitis with possible evolving necrosis tail and body   Persistent  MRSA bacteremia- started on 1/9  -- 4 days into hospital stay no blood culture done on admission so would not know it was POA or noscomial There are metastatic lesions  like thoracic spine infection, left great toe infection now?Osteomyelitis T6-T9- too soon for bacteremia to seed the spine in the hospital    what was the original source IS the pancreas infected with MRSA   Recommend repeating CT abdomen with contrast and if there is abscess, it waill have to be drained and sent for culture as the bacteremia is perisstent from 10/23/23 and we need to decrease the bioburden as much as possble for the antibiotic to be effective After 750mg  of vanco this morning peak was just 4 PK/PD data is very off even though vanco mIc is 1 and it is a sensitive organism Will change vanco to daptomycin     Left great toe MRSA septic arthritis and local abscess- was taken to OR for I/D- cultures sent      Anemia   Leucoytosis resolved   ETOH use   Past h/o transverse myelitis in 2011    Discussed the management with care team

## 2023-10-27 NOTE — Transfer of Care (Signed)
 Immediate Anesthesia Transfer of Care Note  Patient: Danielle Harrison  Procedure(s) Performed: LEFT FOOT 1ST MPJ RESECTION (Left: Toe) INCISION AND DRAINAGE LEFT FOOT (Left)  Patient Location: PACU  Anesthesia Type:MAC  Level of Consciousness: awake, alert , and oriented  Airway & Oxygen  Therapy: Patient Spontanous Breathing and Patient connected to face mask oxygen   Post-op Assessment: Report given to RN, Post -op Vital signs reviewed and stable, and Patient moving all extremities X 4  Post vital signs: Reviewed and stable  Last Vitals:  Vitals Value Taken Time  BP 110/83   Temp    Pulse 89   Resp 12   SpO2 98     Last Pain:  Vitals:   10/27/23 1223  TempSrc: Temporal  PainSc: 6       Patients Stated Pain Goal: 0 (10/26/23 1915)  Complications: No notable events documented.

## 2023-10-27 NOTE — Plan of Care (Signed)
  Problem: Education: Goal: Ability to describe self-care measures that may prevent or decrease complications (Diabetes Survival Skills Education) will improve Outcome: Progressing Goal: Individualized Educational Video(s) Outcome: Progressing   Problem: Coping: Goal: Ability to adjust to condition or change in health will improve Outcome: Progressing   Problem: Nutritional: Goal: Maintenance of adequate nutrition will improve Outcome: Progressing Goal: Progress toward achieving an optimal weight will improve Outcome: Progressing   Problem: Skin Integrity: Goal: Risk for impaired skin integrity will decrease Outcome: Progressing   Problem: Tissue Perfusion: Goal: Adequacy of tissue perfusion will improve Outcome: Progressing   Problem: Clinical Measurements: Goal: Ability to maintain clinical measurements within normal limits will improve Outcome: Progressing Goal: Will remain free from infection Outcome: Progressing Goal: Diagnostic test results will improve Outcome: Progressing Goal: Respiratory complications will improve Outcome: Progressing Goal: Cardiovascular complication will be avoided Outcome: Progressing   Problem: Activity: Goal: Risk for activity intolerance will decrease Outcome: Progressing   Problem: Nutrition: Goal: Adequate nutrition will be maintained Outcome: Progressing   Problem: Elimination: Goal: Will not experience complications related to bowel motility Outcome: Progressing Goal: Will not experience complications related to urinary retention Outcome: Progressing   Problem: Coping: Goal: Level of anxiety will decrease Outcome: Progressing   Problem: Pain Management: Goal: General experience of comfort will improve Outcome: Progressing   Problem: Safety: Goal: Ability to remain free from injury will improve Outcome: Progressing

## 2023-10-27 NOTE — Anesthesia Preprocedure Evaluation (Signed)
 Anesthesia Evaluation  Patient identified by MRN, date of birth, ID band Patient awake    Reviewed: Allergy & Precautions, NPO status , Patient's Chart, lab work & pertinent test results  History of Anesthesia Complications Negative for: history of anesthetic complications  Airway Mallampati: II  TM Distance: >3 FB Neck ROM: full    Dental  (+) Poor Dentition   Pulmonary asthma , former smoker   Pulmonary exam normal        Cardiovascular hypertension, Normal cardiovascular exam  ECHO 10/2023 IMPRESSIONS     1. Left ventricular ejection fraction, by estimation, is 60 to 65%. The  left ventricle has normal function. The left ventricle has no regional  wall motion abnormalities. Left ventricular diastolic parameters were  normal.   2. Right ventricular systolic function is normal. The right ventricular  size is normal. Tricuspid regurgitation signal is inadequate for assessing  PA pressure.   3. The mitral valve is normal in structure. No evidence of mitral valve  regurgitation. No evidence of mitral stenosis.   4. The aortic valve was not well visualized. Aortic valve regurgitation  is mild. No aortic stenosis is present.   5. The inferior vena cava is normal in size with greater than 50%  respiratory variability, suggesting right atrial pressure of 3 mmHg.   Conclusion(s)/Recommendation(s): There is irregular thickening of the  aortic valve seen in the apical 3 chamber view. Coupled with mild aortic  regurgitation and bacteremia, TEE is recommended to evaluate for infective  endocarditis.     Neuro/Psych  PSYCHIATRIC DISORDERS Anxiety      Neuromuscular disease    GI/Hepatic ,,,(+)     substance abuse  alcohol useNecrotizing pancreatitis    Endo/Other  negative endocrine ROS    Renal/GU      Musculoskeletal  (+) Arthritis ,    Abdominal   Peds  Hematology negative hematology ROS (+) MRSA bacteremia,  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   Anesthesia Other Findings Past Medical History: No date: Anxiety No date: Arthritis     Comment:  back No date: Asthma No date: Neuromuscular disorder (HCC)     Comment:  weakness and numbness  No date: Restless leg syndrome  Past Surgical History: 06/28/2019: BIOPSY     Comment:  Procedure: BIOPSY;  Surgeon: Janalyn Keene NOVAK, MD;                Location: [facility];  Service: Endoscopy;; No date: CESAREAN SECTION     Comment:  x2 06/28/2019: COLONOSCOPY WITH PROPOFOL ; N/A     Comment:  Procedure: COLONOSCOPY WITH PROPOFOL ;  Surgeon:               Janalyn Keene NOVAK, MD;  Location: [facility];              Service: Endoscopy;  Laterality: N/A; 06/28/2019: ESOPHAGOGASTRODUODENOSCOPY (EGD) WITH PROPOFOL ; N/A     Comment:  Procedure: ESOPHAGOGASTRODUODENOSCOPY (EGD) WITH               PROPOFOL ;  Surgeon: Janalyn Keene NOVAK, MD;  Location:               [facility];  Service: Endoscopy;  Laterality:               N/A;  BMI    Body Mass Index: 23.08 kg/m      Reproductive/Obstetrics negative OB ROS  Anesthesia Physical Anesthesia Plan  ASA: 4 and emergent  Anesthesia Plan: MAC   Post-op Pain Management: Precedex  and Regional block*   Induction: Intravenous  PONV Risk Score and Plan: 2 and Midazolam  and Treatment may vary due to age or medical condition  Airway Management Planned: Natural Airway and Nasal Cannula  Additional Equipment:   Intra-op Plan:   Post-operative Plan:   Informed Consent: I have reviewed the patients History and Physical, chart, labs and discussed the procedure including the risks, benefits and alternatives for the proposed anesthesia with the patient or authorized representative who has indicated his/her understanding and acceptance.     Dental Advisory Given  Plan Discussed with: Anesthesiologist, CRNA and  Surgeon  Anesthesia Plan Comments: (Patient consented for risks of anesthesia including but not limited to:  - adverse reactions to medications - damage to eyes, teeth, lips or other oral mucosa - nerve damage due to positioning  - sore throat or hoarseness - Damage to heart, brain, nerves, lungs, other parts of body or loss of life  Patient voiced understanding and assent.)        Anesthesia Quick Evaluation

## 2023-10-27 NOTE — Op Note (Signed)
 Full Operative Report  Date of Operation: 2:21 PM, 10/27/2023   Patient: Danielle Harrison - 48 y.o. female  Surgeon: Malvin [facility], DPM   Assistant: None  Diagnosis: LEFT FOOT  Abscess and 1st MPJ septic arthritis   Procedure:  1.  Incision and drainage below fascia level multiple locations, left foot 2.  Bone biopsy first metatarsal head with Jamshidi needle deep, left foot 3.  Preparation and insertion of antibiotic dissolvable calcium sulfate beads with vancomycin , left foot 4.  Application negative pressure wound therapy 5 x 2 x 1 cm, left foot     Anesthesia: Monitor Anesthesia Care  No responsible provider has been recorded for the case.  Anesthesiologist: Chesley Lendia CROME, MD CRNA: Brandy Almarie BROCKS, CRNA   Estimated Blood Loss: 20 mL  Hemostasis: 1) Anatomical dissection, mechanical compression, electrocautery 2) no tourniquet was used during the procedure  Implants: Implant Name Type Inv. Item Serial No. Manufacturer Lot No. LRB No. Used Action  KIT STIMULAN RAPID CURE 5CC - [id number] Orthopedic Implant KIT STIMULAN RAPID CURE 5CC  [employer] [id number] Left 1 Implanted    Materials: Home  Injectables: 1) Pre-operatively: 20 cc of 50:50 mixture 1%lidocaine  plain and 0.5% marcaine  plain 2) Post-operatively: None   Specimens: Pathology: 1. First metatarsal head bone biopsy for pathology microbiology: 1.  First MPJ intra-articular swab culture 2. fluid from about the first MPJ for cytology/crystal analysis/culture 3.  Bone from first proximal phalanx base dorsally for culture   Antibiotics: IV antibiotics given per schedule on the floor  Drains: None  Complications: Patient tolerated the procedure well without complication.   Operative findings: As below in detailed report  Indications for Procedure: Danielle Harrison presents to [facility] [place] with a chief complaint of swelling and pain about the first MPJ on the left foot.  MRI  with concern for abscess and possible septic arthritis of the first MPJ.  The patient has failed conservative treatments of various modalities. At this time the patient has elected to proceed with surgical correction. All alternatives, risks, and complications of the procedures were thoroughly explained to the patient. Patient exhibits appropriate understanding of all discussion points and informed consent was signed and obtained in the chart with no guarantees to surgical outcome given or implied.  Description of Procedure: Patient was brought to the operating room. Patient was placed on the operating room table in the supine position. A surgical timeout was performed and all members of the operating room, the procedure, and the surgical site were identified. anesthesia occurred as per anesthesia record. Local anesthetic as previously described was then injected about the operative field in a local infiltrative block.  The operative lower extremity as noted above was then prepped and draped in the usual sterile manner. The following procedure then began.  Attention was directed to the dorsal aspect of the first MPJ on the left foot.  Incision was made through the skin with 15 blade.  Incision was carried to the level of the dorsal first metatarsophalangeal joint capsule.  Hemostasis was achieved with electrocautery.  On initial incision no purulence was noted.  Next incision was carried to through the first MPJ capsule on the left foot.  No frank purulence was expressed from within the first MPJ.  The joint was further opened and a swab culture was obtained from inside the first MPJ.  Next a hemostat was used to explore the soft tissue surrounding the joint both medially at the first MPJ medial  bursa and the first interspace.  Upon using a hemostat and freeing up several loculations there was noted to be purulence from both the medial aspect of the first MPJ thought to be extra-articular as well as within the  first intermetatarsal space also extra-articular.  Approximately 3 to 5 cc total of purulence was expressed.  Several different areas of purulence were noted.  They were freed up with the hemostat.  Rongeur was used to resect any necrotic or fibrotic tissues however there was not significant necrosis noted on exam.  Inspection of the first metatarsal head and base of proximal phalanx cartilaginous surface did not yield obvious evidence of erosions or necrosis.  Overall appeared relatively healthy without evidence of osteomyelitis.  Next decision was made to defer first metatarsal phalangeal resection given healthy appearance of the joint and lack of obvious evidence of osteomyelitis of the first met head and base of proximal phalanx.  Instead a bone biopsy was taken from the base of proximal phalanx dorsally for culture.  A separate biopsy via Jamshidi needle was harvested from the first metatarsal head.  This was sent for pathology.  The bone was firm and did not feel or appear to be infected at this time.  Following expression of all purulent fluids the joint was then washed with 3 L of sterile saline via power pulse lavage.  No further sinus tracts or abscesses were identified.  Next dissolvable calcium sulfate antibiotic beads mixed with vancomycin  were prepared on the back table.  Small beads were formed.  These were then placed within the surgical cavity deep within the first and metatarsal space as well as medially to the first metatarsal head.  Next decision was made to place negative pressure wound therapy system.  The surgical site measured 5 cm x 2 cm x 1 cm postdebridement.  Black foam sponge was placed within the first and metatarsal space and overlying the dorsal aspect of the first MPJ wrapping towards the medial aspect.  It was covered with wound VAC drape and then set to suction at 125 mmHg continuous.  The surgical site was then dressed with 4 x 4 Kerlix Ace wrap. The patient tolerated  both the procedure and anesthesia well with vital signs stable throughout. The patient was transferred in good condition and all vital signs stable  from the OR to recovery under the discretion of anesthesia.  Condition: Vital signs stable, neurovascular status unchanged from preoperative   Surgical plan:  Several areas of small abscess were incised and drained in the extra-articular soft tissues both medial first met head bursa and first intermetatarsal space.  No significantly large fluid collection was encountered.  Joint appeared healthy overall without evidence of frank erosions or osteonecrosis.  Will await pathology and culture data.  Appreciate ID recommendations.  Patient will return to the OR later this week likely Wednesday versus Thursday for repeat washout possible first MPJ resection as needed and delayed primary closure.  Nonweightbearing left lower extremity in postop shoe and soft dressing leave VAC intact and functioning until next OR.  The patient will be nonweightbearing in a VAC and soft dressing with postop shoe available to the operative limb until further instructed. The dressing is to remain clean, dry, and intact. Will continue to follow unless noted elsewhere.   Marsa Honour, DPM [facility]

## 2023-10-28 ENCOUNTER — Encounter: Payer: Self-pay | Admitting: Podiatry

## 2023-10-28 ENCOUNTER — Encounter
Admission: EM | Disposition: A | Discharge status: Home Health | Payer: Self-pay | Source: Home / Self Care | Attending: Student

## 2023-10-28 ENCOUNTER — Inpatient Hospital Stay: Payer: Medicaid Other

## 2023-10-28 ENCOUNTER — Inpatient Hospital Stay
Admit: 2023-10-28 | Discharge: 2023-10-28 | Disposition: A | Discharge status: Home / Self Care | Payer: Medicaid Other | Attending: Nurse Practitioner | Admitting: Nurse Practitioner

## 2023-10-28 ENCOUNTER — Other Ambulatory Visit (HOSPITAL_COMMUNITY): Payer: Self-pay

## 2023-10-28 DIAGNOSIS — B9562 Methicillin resistant Staphylococcus aureus infection as the cause of diseases classified elsewhere: Secondary | ICD-10-CM | POA: Diagnosis not present

## 2023-10-28 DIAGNOSIS — M00072 Staphylococcal arthritis, left ankle and foot: Secondary | ICD-10-CM | POA: Diagnosis not present

## 2023-10-28 DIAGNOSIS — M4624 Osteomyelitis of vertebra, thoracic region: Secondary | ICD-10-CM

## 2023-10-28 DIAGNOSIS — K859 Acute pancreatitis without necrosis or infection, unspecified: Secondary | ICD-10-CM | POA: Diagnosis not present

## 2023-10-28 DIAGNOSIS — R7881 Bacteremia: Secondary | ICD-10-CM | POA: Diagnosis not present

## 2023-10-28 LAB — BASIC METABOLIC PANEL
Anion gap: 10 (ref 5–15)
BUN: 13 mg/dL (ref 6–20)
CO2: 26 mmol/L (ref 22–32)
Calcium: 8.7 mg/dL — ABNORMAL LOW (ref 8.9–10.3)
Chloride: 97 mmol/L — ABNORMAL LOW (ref 98–111)
Creatinine, Ser: 0.53 mg/dL (ref 0.44–1.00)
GFR, Estimated: >60 mL/min (ref >60)
Glucose, Bld: 116 mg/dL — ABNORMAL HIGH (ref 70–99)
Potassium: 3.8 mmol/L (ref 3.5–5.1)
Sodium: 133 mmol/L — ABNORMAL LOW (ref 135–145)

## 2023-10-28 LAB — GLUCOSE, CAPILLARY
Glucose-Capillary: 104 mg/dL — ABNORMAL HIGH (ref 70–99)
Glucose-Capillary: 135 mg/dL — ABNORMAL HIGH (ref 70–99)
Glucose-Capillary: 135 mg/dL — ABNORMAL HIGH (ref 70–99)
Glucose-Capillary: 183 mg/dL — ABNORMAL HIGH (ref 70–99)
Glucose-Capillary: 248 mg/dL — ABNORMAL HIGH (ref 70–99)
Glucose-Capillary: 261 mg/dL — ABNORMAL HIGH (ref 70–99)
Glucose-Capillary: 323 mg/dL — ABNORMAL HIGH (ref 70–99)
Glucose-Capillary: 492 mg/dL — ABNORMAL HIGH (ref 70–99)

## 2023-10-28 LAB — COMPREHENSIVE METABOLIC PANEL
ALT: 19 U/L (ref 0–44)
AST: 32 U/L (ref 15–41)
Albumin: 2.4 g/dL — ABNORMAL LOW (ref 3.5–5.0)
Alkaline Phosphatase: 137 U/L — ABNORMAL HIGH (ref 38–126)
Anion gap: 14 (ref 5–15)
BUN: 12 mg/dL (ref 6–20)
CO2: 22 mmol/L (ref 22–32)
Calcium: 8.4 mg/dL — ABNORMAL LOW (ref 8.9–10.3)
Chloride: 93 mmol/L — ABNORMAL LOW (ref 98–111)
Creatinine, Ser: 0.71 mg/dL (ref 0.44–1.00)
GFR, Estimated: >60 mL/min (ref >60)
Glucose, Bld: 502 mg/dL (ref 70–99)
Potassium: 4.5 mmol/L (ref 3.5–5.1)
Sodium: 129 mmol/L — ABNORMAL LOW (ref 135–145)
Total Bilirubin: 0.8 mg/dL (ref 0.0–1.2)
Total Protein: 6.2 g/dL — ABNORMAL LOW (ref 6.5–8.1)

## 2023-10-28 LAB — C-REACTIVE PROTEIN: CRP: 18.5 mg/dL — ABNORMAL HIGH (ref <1.0)

## 2023-10-28 LAB — CBC
HCT: 32.1 % — ABNORMAL LOW (ref 36.0–46.0)
Hemoglobin: 11 g/dL — ABNORMAL LOW (ref 12.0–15.0)
MCH: 33.4 pg (ref 26.0–34.0)
MCHC: 34.3 g/dL (ref 30.0–36.0)
MCV: 97.6 fL (ref 80.0–100.0)
Platelets: 511 10*3/uL — ABNORMAL HIGH (ref 150–400)
RBC: 3.29 MIL/uL — ABNORMAL LOW (ref 3.87–5.11)
RDW: 13.3 % (ref 11.5–15.5)
WBC: 16.3 10*3/uL — ABNORMAL HIGH (ref 4.0–10.5)
nRBC: 0 % (ref 0.0–0.2)

## 2023-10-28 LAB — SURGICAL PATHOLOGY

## 2023-10-28 LAB — MAGNESIUM: Magnesium: 2 mg/dL (ref 1.7–2.4)

## 2023-10-28 LAB — SEDIMENTATION RATE: Sed Rate: 66 mm/h — ABNORMAL HIGH (ref 0–20)

## 2023-10-28 LAB — PHOSPHORUS: Phosphorus: 2.7 mg/dL (ref 2.5–4.6)

## 2023-10-28 SURGERY — ECHOCARDIOGRAM, TRANSESOPHAGEAL
Anesthesia: General

## 2023-10-28 MED ORDER — SODIUM CHLORIDE 0.9% FLUSH: 3.0000 mL | INTRAVENOUS | Status: DC | PRN

## 2023-10-28 MED ORDER — SODIUM CHLORIDE 0.9% FLUSH
3.0000 mL | Freq: Two times a day (BID) | INTRAVENOUS | Status: DC
  Administered 2023-10-28: 10 mL via INTRAVENOUS

## 2023-10-28 MED ORDER — ORAL CARE MOUTH RINSE: 15.0000 mL | OROMUCOSAL | Status: DC | PRN

## 2023-10-28 MED ORDER — IOHEXOL 300 MG/ML  SOLN
80.0000 mL | Freq: Once | INTRAMUSCULAR | Status: AC | PRN
  Administered 2023-10-28: 80 mL via INTRAVENOUS

## 2023-10-28 MED ORDER — IOHEXOL 300 MG/ML  SOLN
30.0000 mL | Freq: Once | INTRAMUSCULAR | Status: AC | PRN
  Administered 2023-10-28: 30 mL via ORAL

## 2023-10-28 NOTE — Progress Notes (Signed)
Order received from Dr Kumar to discontinue telemetry 

## 2023-10-28 NOTE — Plan of Care (Signed)

## 2023-10-28 NOTE — Anesthesia Postprocedure Evaluation (Signed)
 Anesthesia Post Note  Patient: Mercia L [place]  Procedure(s) Performed: LEFT FOOT 1ST MPJ RESECTION (Left: Toe) INCISION AND DRAINAGE LEFT FOOT (Left)  Patient location during evaluation: PACU Anesthesia Type: General Level of consciousness: awake and alert Pain management: pain level controlled Vital Signs Assessment: post-procedure vital signs reviewed and stable Respiratory status: spontaneous breathing, nonlabored ventilation, respiratory function stable and patient connected to nasal cannula oxygen  Cardiovascular status: blood pressure returned to baseline and stable Postop Assessment: no apparent nausea or vomiting Anesthetic complications: no   No notable events documented.   Last Vitals:  Vitals:   10/28/23 0302 10/28/23 0732  BP: 108/80 129/88  Pulse: 84 92  Resp: 20 18  Temp: 37 C 36.8 C  SpO2: 100% 99%    Last Pain:  Vitals:   10/28/23 0616  TempSrc:   PainSc: 10-Worst pain ever                 Lendia LITTIE Mae

## 2023-10-28 NOTE — Plan of Care (Signed)

## 2023-10-28 NOTE — Progress Notes (Signed)
 Date of Admission:  10/18/2023   T  ID: Danielle Harrison is a 48 y.o. 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)   Abscess of left foot   MRSA bacteremia    Subjective: Has back pain and abdominal pain No fever  Medications:   enoxaparin  (LOVENOX ) injection  40 mg Subcutaneous Q24H   feeding supplement  237 mL Oral BID BM   folic acid   1 mg Oral Daily   guaiFENesin   600 mg Oral BID   insulin  aspart  0-9 Units Subcutaneous TID WC   metoprolol  tartrate  50 mg Oral BID   multivitamin with minerals  1 tablet Oral Daily   potassium chloride   40 mEq Oral Once   thiamine   100 mg Oral Daily   Or   thiamine   100 mg Intravenous Daily    Objective: Vital signs in last 24 hours: Patient Vitals for the past 24 hrs:  BP Temp Temp src Pulse Resp SpO2 Weight  10/28/23 1436 109/81 98 F (36.7 C) Oral 92 16 100 % --  10/28/23 0732 129/88 98.2 F (36.8 C) -- 92 18 99 % --  10/28/23 0703 -- -- -- -- -- -- 57.2 kg  10/28/23 0302 108/80 98.6 F (37 C) Oral 84 20 100 % --  10/27/23 2048 118/81 -- -- 99 -- -- --  10/27/23 2041 118/81 99 F (37.2 C) Oral 99 17 100 % --      PHYSICAL EXAM:  General: Awake.  In distress Lungs: Clear to auscultation bilaterally. No Wheezing or Rhonchi. No rales. Heart: Regular rate and rhythm, no murmur, rub or gallop. Abdomen: Tenderness in the epigastrium Extremities: rt foot dressing not removed Wound vac in place  Lab Results    Latest Ref Rng & Units 10/28/2023    4:19 AM 10/27/2023    4:35 AM 10/26/2023    8:19 AM  CBC  WBC 4.0 - 10.5 K/uL 16.3  16.4  11.0   Hemoglobin 12.0 - 15.0 g/dL 88.9  89.0  9.4   Hematocrit 36.0 - 46.0 % 32.1  31.2  27.7   Platelets 150 - 400 K/uL 511  278  211        Latest Ref Rng & Units 10/28/2023    4:19 AM 10/28/2023   12:08 AM 10/27/2023     4:35 AM  CMP  Glucose 70 - 99 mg/dL 883  497  799   BUN 6 - 20 mg/dL 13  12  5    Creatinine 0.44 - 1.00 mg/dL 9.46  9.28  9.59   Sodium 135 - 145 mmol/L 133  129  131   Potassium 3.5 - 5.1 mmol/L 3.8  4.5  3.4   Chloride 98 - 111 mmol/L 97  93  93   CO2 22 - 32 mmol/L 26  22  23    Calcium 8.9 - 10.3 mg/dL 8.7  8.4  8.9   Total Protein 6.5 - 8.1 g/dL  6.2    Total Bilirubin 0.0 - 1.2 mg/dL  0.8    Alkaline Phos 38 - 126 U/L  137    AST 15 - 41 U/L  32    ALT 0 - 44 U/L  19        Microbiology: [facility] 10/23/23 MRSA bacteremia 4/4 10/25/23 MRSA bacteremia 4/4 1/12 BC-  gram positive coccci Studies/Results: DG Chest Port 1 View Result Date: 10/28/2023 CLINICAL DATA:  Shortness of breath. EXAM: PORTABLE CHEST 1 VIEW COMPARISON:  Chest radiograph dated 10/23/2023. FINDINGS: Mild peribronchial and interstitial densities may represent reactive small airway disease or viral infection. Clinical correlation recommended no focal consolidation or pneumothorax. Trace bilateral pleural effusions may be present. The cardiac silhouette is within normal limits. No acute osseous pathology. IMPRESSION: Mild peribronchial and interstitial densities may represent reactive small airway disease or viral infection. Electronically Signed   By: Vanetta Chou M.D.   On: 10/28/2023 14:06     Assessment/Plan: Acute pancreatitis with possible evolving necrosis tail and body   Persistent  MRSA bacteremia- started on 1/9  -- 4 days into hospital stay no blood culture done on admission so would not know it was POA or noscomial There are metastatic lesions  like thoracic spine infection, left great toe infection now?Osteomyelitis T6-T9- too soon for bacteremia to seed the spine in the hospital    what was the original source IS the pancreas infected with MRSA   Recommend repeating CT abdomen with contrast and if there is abscess, it waill have to be drained and sent for culture as the bacteremia is perisstent from  10/23/23 and we need to decrease the bioburden as much as possble for the antibiotic to be effective As  750mg  of vanco  peak was just 4 PK/PD data is very off even though vanco mIc is 1 and it is a sensitive organism  changed vanco to daptomycin     Left great toe MRSA septic arthritis and local abscess- was taken to OR for I/D- cultures sent      Anemia   Leucoytosis post procedure  ETOH use   Past h/o transverse myelitis in 2011    Discussed the management with patient and Dr.kumar

## 2023-10-28 NOTE — Progress Notes (Signed)
 PROGRESS NOTE    Danielle Harrison  FMW:[id number] DOB: 02-25-1976 DOA: 10/18/2023 PCP: [facility]  201A/201A-AA  LOS: 10 days   Brief hospital course: Danielle Harrison is a 48 y.o. female with medical history Anxiety, Asthma, HLD, HTN restless leg syndrome, etoh abuse, hx of etoh pancreatitis who presents to ED with n/v/ severe abdominal pain x 48 hours.  She notes  pain mid to lower abdomen and radiates towards her back. She note no fever /chills/ chest pain or sob. Patient also noted normal bowel movements, no diarrhea or constipation. Notes n/v improved s/p treatment in ED.  Patient was found to have alcoholic necrotic pancreatitis, MRSA bacteremia most likely due to osteomyelitis of T6-T9, left foot inflammation and left great toe septic arthritis.  ID consulted, patient was on vancomycin  and Zosyn , which was switched to cefepime  and Flagyl .    Assessment & Plan:  Acute ETOH pancreatitis  With developing necrosis --continued to have abdominal pain.  Started having fevers.  Repeat CT a/p showed finding concerning for developing necrosis.  No organized fluid collection. --abd pain improving and is tolerating full liquids -General Surgery following recommended transfer to tertiary care center.  [facility] and [facility] declined due to capacity.  General surgery signed off. On 1/12 case was discussed with Dr. Teresa at [facility], recommended conservative management and antibiotics, no need of surgical intervention at this time unless there is a large infected fluid collection. - s/p vanc, and Zosyn .  1/12 started cefepime  and Flagyl  As per ID patient will need 6 weeks of antibiotics 1/14 tolerating full liquid diet, advance to soft diet, advised to start with small meals and advance as per tolerance.  Sepsis due to MRSA bacteremia Hemodynamically stable. Patient denies hardware, hx ivdu, new piv yesterday S/p vanc and Zosyn , 1/12 started cefepime  and Flagyl . 1/13 started daptomycin  - ID to  see - TTE LVEF 60-65%, aortic valve thickening, recommended TEE to rule out endocarditis Cardiology consulted for TEE, scheduled for tomorrow, keep n.p.o. 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 Follow fluid culture ID following    # Focal small bowel intussusception in the left mid-abdomen  --Per G[facility]urg, likely incidental, No clinical evidence of bowel obstruction or ischemia. No surgical intervention is needed from that standpoint either.  --repeat CT a/p showed the same. Plan: --Short interval follow up CT is recommended to ensure resolution and exclude a neoplastic lead point.   Acute metabolic acidosis  Lactic acidosis --resolved after IVF   Hyponatremia, presumed due to pancreatitis --continue ivf   Hypokalemia, potassium repleted Hypophosphatemia, Phos repleted Monitor electrolytes and replete as needed  Hyperglycemia  Prediabetes --A1c 6.3   Hx of HTN --hold Toprol     HLD not taking medications due to loss of insurance Follow outpatient   ETOH  --no signs of withdrawal   Reactive ileus and constipation --resolved with aggressive Miralax     DVT prophylaxis: Lovenox  SQ Code Status: Full code  Family Communication:  Level of care: Med-Surg Dispo:   The patient is from: home Anticipated d/c is to: home Anticipated d/c date is: to be determined 1/11  declined by [facility] due to capacity  1/12 declined by [facility] and [facility] due to capacity.   Subjective and Interval History:  No significant events overnight, back pain is 5/10, patient is gradually feeling improvement,  abdomen is tender but no pain, tolerating for liquid diet, patient is willing to try soft diet.  Denies any pain in the left foot.  No nasal complaints.  Objective: Vitals:   10/28/23 0302 10/28/23 0703 10/28/23 0732 10/28/23 1436  BP: 108/80  129/88 109/81  Pulse: 84  92 92  Resp: 20  18 16   Temp: 98.6 F (37 C)  98.2 F (36.8 C) 98 F (36.7 C)  TempSrc: Oral   Oral  SpO2: 100%  99% 100%  Weight:  57.2 kg    Height:        Intake/Output Summary (Last 24 hours) at 10/28/2023 1510 Last data filed at 10/28/2023 1437 Gross per 24 hour  Intake 3360.79 ml  Output 400 ml  Net 2960.79 ml   Filed Weights   10/26/23 0607 10/27/23 0600 10/28/23 0703  Weight: 53.3 kg 53.6 kg 57.2 kg    Examination:   Constitutional: NAD, AAOx3 HEENT: conjunctivae and lids normal, EOMI CV: No cyanosis.  Tachycardic, rr RESP: normal respiratory effort, on RA Neuro: II - XII grossly intact.   Msk: no spinous process tencerness. Mild swelling left first mtp LE: left  great toe s/p wash out, dressing CDI  Data Reviewed: I have personally reviewed labs and imaging studies  CRITICAL CARE Performed by: Elvan Sor   Total critical care time: 40 minutes  Critical care time was exclusive of separately billable procedures and treating other patients.  Critical care was necessary to treat or prevent imminent or life-threatening deterioration.  Critical care was time spent personally by me on the following activities: development of treatment plan with patient and/or surrogate as well as nursing, discussions with consultants, evaluation of patient's response to treatment, examination of patient, obtaining history from patient or surrogate, ordering and performing treatments and interventions, ordering and review of laboratory studies, ordering and review of radiographic studies, pulse oximetry and re-evaluation of patient's condition.    Elvan Sor, MD [facility] If 7PM-7AM, please contact  night-coverage 10/28/2023, 3:10 PM

## 2023-10-28 NOTE — Inpatient Diabetes Management (Signed)
 Inpatient Diabetes Program Recommendations  AACE/ADA: New Consensus Statement on Inpatient Glycemic Control (2015)  Target Ranges:  Prepandial:   less than 140 mg/dL      Peak postprandial:   less than 180 mg/dL (1-2 hours)      Critically ill patients:  140 - 180 mg/dL    Latest Reference Range & Units 10/18/23 18:27  Hemoglobin A1C 4.8 - 5.6 % 6.3 (H)  134 mg/dl  (H): Data is abnormally high  Latest Reference Range & Units 10/27/23 07:33 10/27/23 11:52 10/27/23 16:24 10/27/23 21:44 10/27/23 23:19 10/28/23 00:23 10/28/23 01:36 10/28/23 03:01 10/28/23 05:14 10/28/23 07:32  Glucose-Capillary 70 - 99 mg/dL 809 (H) 826 (H)  4 mg Decadron  @1329  206 (H)  3 units Novolog   497 (H) 568 (HH)  10 units units Novolog   492 (H) 248 (H) 135 (H) 135 (H) 183 (H)    Admit 1/4 with Acute alcoholic pancreatitis/ MRSA bacteremia/ Progressive hyperglycemia/ Hyponatremia/ Vertebral OM at T6-T9 on MRI   No Hx of Diabetes--Current A1c= 6.3%   Current Orders: Novolog  0-9 units TID   Received Decadron  X 1 dose for Surgery yest CBGs >450 last PM CBG down to 183 this AM   Met w/ pt at bedside this AM to review her CBGs, need for insulin  in the hospital, Current A1c, etc.  Reviewed with pt that her A1c places her in the Pre-diabetes (or high risk for diabetes) category--explained what A1c measures.  Pt told me her Mom has Diabetes.  I discussed with pt that she received Steroids yest for surgery and that the Steroids, coupled with her Pancreatitis and Foot Abscess/ Septic Arthritis are likely all contributing to her high glucose levels.  We talked about the importance of good CBG control in hospital and after d/c.  Strongly encouraged pt to follow up with her PCP for repeat A1c testing in 3-6 months.  Pt told me she doesn't have insurance right now and can't see her normal PCP due to lack of insurance.  Have consulted TOC team to see if pt eligible for one of the low cost clinics in [place].       --Will follow patient during hospitalization--  Danielle Rudolpho Arrow RN, MSN, CDCES Diabetes Coordinator Inpatient Glycemic Control Team Team Pager: [phone] (8a-5p)

## 2023-10-29 ENCOUNTER — Inpatient Hospital Stay: Payer: Self-pay | Admitting: Anesthesiology

## 2023-10-29 ENCOUNTER — Inpatient Hospital Stay (HOSPITAL_COMMUNITY)
Admit: 2023-10-29 | Discharge: 2023-10-29 | Disposition: A | Discharge status: Home / Self Care | Payer: Medicaid Other | Attending: Cardiology | Admitting: Cardiology

## 2023-10-29 ENCOUNTER — Encounter
Admission: EM | Disposition: A | Discharge status: Home Health | Payer: Self-pay | Source: Home / Self Care | Attending: Student

## 2023-10-29 DIAGNOSIS — M4624 Osteomyelitis of vertebra, thoracic region: Secondary | ICD-10-CM | POA: Diagnosis not present

## 2023-10-29 DIAGNOSIS — R7881 Bacteremia: Secondary | ICD-10-CM | POA: Diagnosis not present

## 2023-10-29 DIAGNOSIS — I330 Acute and subacute infective endocarditis: Secondary | ICD-10-CM

## 2023-10-29 DIAGNOSIS — B9562 Methicillin resistant Staphylococcus aureus infection as the cause of diseases classified elsewhere: Secondary | ICD-10-CM | POA: Diagnosis not present

## 2023-10-29 DIAGNOSIS — K859 Acute pancreatitis without necrosis or infection, unspecified: Secondary | ICD-10-CM | POA: Diagnosis not present

## 2023-10-29 DIAGNOSIS — M00072 Staphylococcal arthritis, left ankle and foot: Secondary | ICD-10-CM | POA: Diagnosis not present

## 2023-10-29 HISTORY — PX: TEE WITHOUT CARDIOVERSION: SHX5443

## 2023-10-29 LAB — PHOSPHORUS: Phosphorus: 3.1 mg/dL (ref 2.5–4.6)

## 2023-10-29 LAB — BASIC METABOLIC PANEL
Anion gap: 12 (ref 5–15)
BUN: 7 mg/dL (ref 6–20)
CO2: 25 mmol/L (ref 22–32)
Calcium: 8.5 mg/dL — ABNORMAL LOW (ref 8.9–10.3)
Chloride: 95 mmol/L — ABNORMAL LOW (ref 98–111)
Creatinine, Ser: 0.47 mg/dL (ref 0.44–1.00)
GFR, Estimated: >60 mL/min (ref >60)
Glucose, Bld: 221 mg/dL — ABNORMAL HIGH (ref 70–99)
Potassium: 4 mmol/L (ref 3.5–5.1)
Sodium: 132 mmol/L — ABNORMAL LOW (ref 135–145)

## 2023-10-29 LAB — CBC
HCT: 30.7 % — ABNORMAL LOW (ref 36.0–46.0)
Hemoglobin: 10.6 g/dL — ABNORMAL LOW (ref 12.0–15.0)
MCH: 33.3 pg (ref 26.0–34.0)
MCHC: 34.5 g/dL (ref 30.0–36.0)
MCV: 96.5 fL (ref 80.0–100.0)
Platelets: 659 10*3/uL — ABNORMAL HIGH (ref 150–400)
RBC: 3.18 MIL/uL — ABNORMAL LOW (ref 3.87–5.11)
RDW: 13.2 % (ref 11.5–15.5)
WBC: 14.9 10*3/uL — ABNORMAL HIGH (ref 4.0–10.5)
nRBC: 0 % (ref 0.0–0.2)

## 2023-10-29 LAB — CULTURE, BLOOD (ROUTINE X 2): Special Requests: ADEQUATE

## 2023-10-29 LAB — SURGICAL PCR SCREEN
MRSA, PCR: POSITIVE — AB
Staphylococcus aureus: POSITIVE — AB

## 2023-10-29 LAB — MAGNESIUM: Magnesium: 1.9 mg/dL (ref 1.7–2.4)

## 2023-10-29 LAB — ECHO TEE

## 2023-10-29 LAB — GLUCOSE, CAPILLARY
Glucose-Capillary: 253 mg/dL — ABNORMAL HIGH (ref 70–99)
Glucose-Capillary: 214 mg/dL — ABNORMAL HIGH (ref 70–99)
Glucose-Capillary: 259 mg/dL — ABNORMAL HIGH (ref 70–99)
Glucose-Capillary: 178 mg/dL — ABNORMAL HIGH (ref 70–99)

## 2023-10-29 SURGERY — TRANSESOPHAGEAL ECHOCARDIOGRAM (TEE)
Anesthesia: General

## 2023-10-29 MED ORDER — OXYCODONE HCL 5 MG/5ML PO SOLN: 5.0000 mg | Freq: Once | ORAL | Status: DC | PRN

## 2023-10-29 MED ORDER — BISACODYL 10 MG RE SUPP
10.0000 mg | Freq: Every day | RECTAL | Status: DC | PRN
  Administered 2023-10-31: 10 mg via RECTAL
  Filled 2023-10-29: qty 1

## 2023-10-29 MED ORDER — SODIUM CHLORIDE 0.9 % IV SOLN: INTRAVENOUS | Status: DC | PRN

## 2023-10-29 MED ORDER — BISACODYL 5 MG PO TBEC
10.0000 mg | DELAYED_RELEASE_TABLET | Freq: Every day | ORAL | Status: DC
  Administered 2023-10-29 – 2023-11-03 (×6): 10 mg via ORAL
  Filled 2023-10-29 (×6): qty 2

## 2023-10-29 MED ORDER — LIDOCAINE VISCOUS HCL 2 % MT SOLN
OROMUCOSAL | Status: AC
  Filled 2023-10-29: qty 15

## 2023-10-29 MED ORDER — SODIUM CHLORIDE 1 G PO TABS
1.0000 g | ORAL_TABLET | Freq: Three times a day (TID) | ORAL | Status: AC
  Administered 2023-10-29 – 2023-10-31 (×5): 1 g via ORAL
  Filled 2023-10-29 (×6): qty 1

## 2023-10-29 MED ORDER — BISACODYL 5 MG PO TBEC
10.0000 mg | DELAYED_RELEASE_TABLET | Freq: Once | ORAL | Status: AC
  Administered 2023-10-29: 10 mg via ORAL
  Filled 2023-10-29: qty 2

## 2023-10-29 MED ORDER — BUTAMBEN-TETRACAINE-BENZOCAINE 2-2-14 % EX AERO
INHALATION_SPRAY | CUTANEOUS | Status: AC
  Filled 2023-10-29: qty 5

## 2023-10-29 MED ORDER — MUPIROCIN 2 % EX OINT
1.0000 | TOPICAL_OINTMENT | Freq: Two times a day (BID) | CUTANEOUS | Status: AC
  Administered 2023-10-29 – 2023-11-03 (×10): 1 via NASAL
  Filled 2023-10-29: qty 22

## 2023-10-29 MED ORDER — OXYCODONE HCL 5 MG PO TABS: 5.0000 mg | ORAL_TABLET | Freq: Once | ORAL | Status: DC | PRN

## 2023-10-29 MED ORDER — POLYETHYLENE GLYCOL 3350 17 G PO PACK
17.0000 g | PACK | Freq: Two times a day (BID) | ORAL | Status: DC
  Filled 2023-10-29 (×6): qty 1

## 2023-10-29 MED ORDER — FENTANYL CITRATE (PF) 100 MCG/2ML IJ SOLN: 25.0000 ug | INTRAMUSCULAR | Status: DC | PRN

## 2023-10-29 MED ORDER — PROPOFOL 10 MG/ML IV BOLUS
INTRAVENOUS | Status: DC | PRN
  Administered 2023-10-29: 40 mg via INTRAVENOUS
  Administered 2023-10-29 (×2): 30 mg via INTRAVENOUS
  Administered 2023-10-29: 100 mg via INTRAVENOUS

## 2023-10-29 MED ORDER — LIDOCAINE HCL (CARDIAC) PF 100 MG/5ML IV SOSY
PREFILLED_SYRINGE | INTRAVENOUS | Status: DC | PRN
  Administered 2023-10-29: 80 mg via INTRAVENOUS

## 2023-10-29 MED ORDER — CHLORHEXIDINE GLUCONATE CLOTH 2 % EX PADS
6.0000 | MEDICATED_PAD | Freq: Every day | CUTANEOUS | Status: AC
  Administered 2023-10-30 – 2023-11-03 (×5): 6 via TOPICAL

## 2023-10-29 NOTE — Plan of Care (Signed)
 Spoke with patient on the phone this afternoon.  Discussed finding of osteomyelitis from the bone biopsy from OR on monday.  Recommend 1st MPJ resection with abx spacer, repeat washout, possible closure vs vac.  She agrees to proceed. Plan for OR tomorrow AM 0730 for above. NPO p MN.         Danielle Harrison, DPM [facility] / [facility]                   10/29/2023

## 2023-10-29 NOTE — Anesthesia Postprocedure Evaluation (Signed)
 Anesthesia Post Note  Patient: Trechelle Beacher [place]lun  Procedure(s) Performed: TRANSESOPHAGEAL ECHOCARDIOGRAM (TEE)  Patient location during evaluation: Specials Recovery Anesthesia Type: General Level of consciousness: awake and alert Pain management: pain level controlled Vital Signs Assessment: post-procedure vital signs reviewed and stable Respiratory status: spontaneous breathing, nonlabored ventilation, respiratory function stable and patient connected to nasal cannula oxygen  Cardiovascular status: blood pressure returned to baseline and stable Postop Assessment: no apparent nausea or vomiting Anesthetic complications: no   No notable events documented.   Last Vitals:  Vitals:   10/29/23 0831 10/29/23 0840  BP: (!) 89/51 110/72  Pulse: 99 90  Resp: 20 (!) 28  Temp: 36.8 C   SpO2: 99% 100%    Last Pain:  Vitals:   10/29/23 0831  TempSrc: Temporal  PainSc: 0-No pain                 Portia Brittle Valisa Karpel

## 2023-10-29 NOTE — Transfer of Care (Signed)
 Immediate Anesthesia Transfer of Care Note  Patient: Danielle Harrison  Procedure(s) Performed: TRANSESOPHAGEAL ECHOCARDIOGRAM (TEE)  Patient Location: Short Stay  Anesthesia Type:General  Level of Consciousness: drowsy  Airway & Oxygen  Therapy: Patient Spontanous Breathing and Patient connected to nasal cannula oxygen   Post-op Assessment: Report given to RN, Post -op Vital signs reviewed and stable, and Patient moving all extremities  Post vital signs: Reviewed and stable  Last Vitals:  Vitals Value Taken Time  BP 89/51 10/29/23 0831  Temp 36.8 C 10/29/23 0831  Pulse 99 10/29/23 0831  Resp 20 10/29/23 0831  SpO2 99 % 10/29/23 0831    Last Pain:  Vitals:   10/29/23 0831  TempSrc: Temporal  PainSc: 0-No pain      Patients Stated Pain Goal: 0 (10/29/23 0334)  Complications: No notable events documented.

## 2023-10-29 NOTE — Progress Notes (Signed)
 PROGRESS NOTE    Danielle Harrison  HQI:[id number] DOB: [birth year] DOA: 10/18/2023 PCP: [facility]  201A/201A-AA  LOS: 11 days   Brief hospital course: Danielle Harrison is a 48 y.o. female with medical history Anxiety, Asthma, HLD, HTN restless leg syndrome, etoh abuse, hx of etoh pancreatitis who presents to ED with n/v/ severe abdominal pain x 48 hours.  She notes  pain mid to lower abdomen and radiates towards her back. She note no fever /chills/ chest pain or sob. Patient also noted normal bowel movements, no diarrhea or constipation. Notes n/v improved s/p treatment in ED.  Patient was found to have alcoholic necrotic pancreatitis, MRSA bacteremia most likely due to osteomyelitis of T6-T9, left foot inflammation and left great toe septic arthritis.  ID consulted, patient was on vancomycin  and Zosyn , which was switched to cefepime  and Flagyl .    Assessment & Plan:  Acute ETOH pancreatitis  With developing necrosis --continued to have abdominal pain.  Started having fevers.  Repeat CT a/p showed finding concerning for developing necrosis.  No organized fluid collection. --abd pain improving and is tolerating full liquids -General Surgery following recommended transfer to tertiary care center.  [facility] declined due to capacity.  General surgery signed off. On 1/12 case was discussed with Dr. Camilo Cella at [facility], recommended conservative management and antibiotics, no need of surgical intervention at this time unless there is a large infected fluid collection. - s/p vanc, and Zosyn .  1/12 started cefepime  and Flagyl  As per ID patient will need 6 weeks of antibiotics 1/14 tolerating full liquid diet, advance to soft diet, advised to start with small meals and advance as per tolerance. 1/14 CT A/P: Improving pancreatitis, without convincing necrosis, but with developing small pseudocysts as above. Small left and trace right pleural effusions with bilateral lower lobe  atelectasis.   Sepsis due to MRSA bacteremia Hemodynamically stable. Patient denies hardware, hx ivdu, new piv yesterday S/p vanc and Zosyn , 1/12 started cefepime  and Flagyl . 1/13 started daptomycin  - ID to see - TTE LVEF 60-65%, aortic valve thickening, recommended TEE to rule out endocarditis Cardiology consulted for TEE, scheduled for tomorrow, keep n.p.o. 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 Follow fluid culture ID following 1/15 as per podiatry patient will need [place]ist MPJ resection with a spacer, repeat washout possible closure versus wound VAC.  Patient agreed, keep n.p.o. after midnight, procedure will be done tomorrow a.m.   # Focal small bowel intussusception in the left mid-abdomen  --Per G[facility]urg, likely incidental, No clinical evidence of bowel obstruction or ischemia. No surgical intervention is needed from that standpoint either.  --repeat CT a/p showed the same. Plan: --Short interval follow up CT is recommended to ensure resolution and exclude a neoplastic lead point. 1/14 repeat CT a/p did not show any abnormality in the bowels.  Acute metabolic acidosis  Lactic acidosis --resolved after IVF   Hyponatremia, presumed due to pancreatitis --s/p ivf 1/15 started sodium chloride  tablets 1 g p.o. 3 times daily for 2 days  Hypokalemia, potassium repleted Hypophosphatemia, Phos repleted Monitor electrolytes and replete as needed  Hyperglycemia  Prediabetes --A1c 6.3   Hx of  HTN --hold Toprol     HLD not taking medications due to loss of insurance Follow outpatient   ETOH  --no signs of withdrawal   Reactive ileus and constipation --resolved with aggressive Miralax   Thrombocytosis most likely reactive due to infection Monitor platelet count daily   DVT prophylaxis: Lovenox  SQ Code Status: Full code  Family Communication:  Level of care: Med-Surg Dispo:   The patient is from: home Anticipated d/c is to: home Anticipated d/c date is: to be determined 1/11 declined by [facility] due to capacity  1/12 declined by [facility] and [facility] due to capacity.   Subjective and Interval History:  No significant events overnight, patient was complaining of pain 8/10 on the belly and back.  Also had bad episode last night, acute back pain.  Denied any chest pain or palpitation, no shortness of breath, no any other complaints.   Objective: Vitals:   10/29/23 0840 10/29/23 0845 10/29/23 0900 10/29/23 0911  BP: 110/72 111/79 118/84 130/85  Pulse: 90 89 100 (!) 101  Resp: (!) 28 (!) 25 (!) 27 (!) 21  Temp:      TempSrc:      SpO2: 100% 100% 97% 97%  Weight:      Height:        Intake/Output Summary (Last 24 hours) at 10/29/2023 1524 Last data filed at 10/29/2023 1300 Gross per 24 hour  Intake 100 ml  Output 1 ml  Net 99 ml   Filed Weights   10/27/23 0600 10/28/23 0703 10/29/23 0319  Weight: 53.6 kg 57.2 kg 56.8 kg    Examination:   Constitutional: NAD, AAOx3 HEENT: conjunctivae and lids normal, EOMI CV: No cyanosis.  Tachycardic, rr RESP: normal respiratory effort, on RA Neuro: II - XII grossly intact.   Msk: no spinous process tencerness. Mild swelling left first mtp LE: left  great toe s/p wash out, dressing CDI  Data Reviewed: I have personally reviewed labs and imaging studies  CRITICAL CARE Performed by: Althia Atlas   Total critical care time: 40 minutes  Critical care time was exclusive of separately billable procedures and treating other  patients.  Critical care was necessary to treat or prevent imminent or life-threatening deterioration.  Critical care was time spent personally by me on the following activities: development of treatment plan with patient and/or surrogate as well as nursing, discussions with consultants, evaluation of patient's response to treatment, examination of patient, obtaining history from patient or surrogate, ordering and performing treatments and interventions, ordering and review of laboratory studies, ordering and review of radiographic studies, pulse oximetry and re-evaluation of patient's condition.    Althia Atlas, MD [facility] If 7PM-7AM, please contact night-coverage 10/29/2023, 3:24 PM

## 2023-10-29 NOTE — Plan of Care (Signed)

## 2023-10-29 NOTE — Inpatient Diabetes Management (Signed)
 Inpatient Diabetes Program Recommendations  AACE/ADA: New Consensus Statement on Inpatient Glycemic Control (2015)  Target Ranges:  Prepandial:   less than 140 mg/dL      Peak postprandial:   less than 180 mg/dL (1-2 hours)      Critically ill patients:  140 - 180 mg/dL    Latest Reference Range & Units 10/28/23 07:32 10/28/23 11:51 10/28/23 15:56 10/28/23 21:18 10/29/23 07:14  Glucose-Capillary 70 - 99 mg/dL 161 (H) 096 (H) 045 (H) 261 (H) 253 (H)  (H): Data is abnormally high    Admit 1/4 with Acute alcoholic pancreatitis/ MRSA bacteremia/ Progressive hyperglycemia/ Hyponatremia/ Vertebral OM at T6-T9 on MRI    No Hx of Diabetes--Current A1c= 6.3%   Current Orders: Novolog  0-9 units TID     Received Decadron  X 1 dose for Surgery yest CBGs >450 last PM CBG down to 183 this AM    Met w/ pt at bedside 01/14 to review her CBGs, need for insulin  in the hospital, Current A1c, etc.   MD- Note CBGs >200.  Needs to go back to OR for 1st MPJ resection with abx spacer, repeat washout, possible closure vs vac.   Please consider adding Semglee  5 units at bedtime    --Will follow patient during hospitalization--  Langston Pippins RN, MSN, CDCES Diabetes Coordinator Inpatient Glycemic Control Team Team Pager: [phone] (8a-5p)

## 2023-10-29 NOTE — Progress Notes (Signed)
 Transesophageal Echocardiogram :  Indication: MRSA bacteremia, rule out endocarditis Requesting/ordering  physician:   Procedure: Benzocaine  spray x2 and 2 mls x 2 of viscous lidocaine  were given orally to provide local anesthesia to the oropharynx. The patient was positioned supine on the left side, bite block provided. The patient was moderately sedated with the doses of versed  and fentanyl  as detailed below.  Using digital technique an omniplane probe was advanced into the distal esophagus without incident.   Moderate sedation: 1. Sedation used: Propofol  per anesthesia  See report in EPIC  for complete details: In brief, transgastric imaging revealed normal LV function with no RWMAs and no mural apical thrombus.  .  Estimated ejection fraction was 55%.  Right sided cardiac chambers were normal with no evidence of pulmonary hypertension.  Imaging of the septum showed no ASD or VSD Bubble study was negative for shunt 2D and color flow confirmed no PFO  The LA was well visualized in orthogonal views.  There was no spontaneous contrast and no thrombus in the LA and LA appendage   The descending thoracic aorta had no  mural aortic debris with no evidence of aneurysmal dilation or disection   Danielle Harrison 10/29/2023 8:37 AM

## 2023-10-29 NOTE — Progress Notes (Signed)
*  PRELIMINARY RESULTS* Echocardiogram Echocardiogram Transesophageal has been performed.  Danielle Harrison 10/29/2023, 8:38 AM

## 2023-10-29 NOTE — Anesthesia Preprocedure Evaluation (Signed)
 Anesthesia Evaluation  Patient identified by MRN, date of birth, ID band Patient awake    Reviewed: Allergy & Precautions, NPO status , Patient's Chart, lab work & pertinent test results  History of Anesthesia Complications Negative for: history of anesthetic complications  Airway Mallampati: III  TM Distance: >3 FB Neck ROM: full    Dental  (+) Chipped, Poor Dentition   Pulmonary asthma , former smoker   Pulmonary exam normal        Cardiovascular negative cardio ROS Normal cardiovascular exam     Neuro/Psych  Neuromuscular disease  negative psych ROS   GI/Hepatic negative GI ROS, Neg liver ROS,neg GERD  ,,  Endo/Other  negative endocrine ROS    Renal/GU negative Renal ROS  negative genitourinary   Musculoskeletal   Abdominal   Peds  Hematology negative hematology ROS (+)   Anesthesia Other Findings Patient is NPO appropriate and reports no nausea or vomiting today or yesterday  Past Medical History: No date: Anxiety No date: Arthritis     Comment:  back No date: Asthma No date: Neuromuscular disorder (HCC)     Comment:  weakness and numbness  No date: Restless leg syndrome  Past Surgical History: 06/28/2019: BIOPSY     Comment:  Procedure: BIOPSY;  Surgeon: Irby Mannan, MD;                Location: [facility];  Service: Endoscopy;; No date: CESAREAN SECTION     Comment:  x2 06/28/2019: COLONOSCOPY WITH PROPOFOL ; N/A     Comment:  Procedure: COLONOSCOPY WITH PROPOFOL ;  Surgeon:               Irby Mannan, MD;  Location: [facility];              Service: Endoscopy;  Laterality: N/A; 06/28/2019: ESOPHAGOGASTRODUODENOSCOPY (EGD) WITH PROPOFOL ; N/A     Comment:  Procedure: ESOPHAGOGASTRODUODENOSCOPY (EGD) WITH               PROPOFOL ;  Surgeon: Irby Mannan, MD;  Location:               [facility];  Service: Endoscopy;  Laterality:                N/A; 10/27/2023: INCISION AND DRAINAGE; Left     Comment:  Procedure: INCISION AND DRAINAGE LEFT FOOT;  Surgeon:               Evertt Hoe, DPM;  Location: [facility];                Service: Orthopedics/Podiatry;  Laterality: Left; 10/27/2023: TRANSMETATARSAL AMPUTATION; Left     Comment:  Procedure: LEFT FOOT 1ST MPJ RESECTION;  Surgeon:               Evertt Hoe, DPM;  Location: [facility];                Service: Orthopedics/Podiatry;  Laterality: Left;  BMI    Body Mass Index: 24.46 kg/m      Reproductive/Obstetrics negative OB ROS                             Anesthesia Physical Anesthesia Plan  ASA: 3  Anesthesia Plan: General   Post-op Pain Management:    Induction: Intravenous  PONV Risk Score and Plan: Propofol  infusion and TIVA  Airway Management Planned: Natural Airway and Nasal Cannula  Additional Equipment:   Intra-op Plan:   Post-operative Plan:   Informed Consent: I have reviewed the patients History and Physical, chart, labs and discussed the procedure including the risks, benefits and alternatives for the proposed anesthesia with the patient or authorized representative who has indicated his/her understanding and acceptance.     Dental Advisory Given  Plan Discussed with: Anesthesiologist, CRNA and Surgeon  Anesthesia Plan Comments: (Patient consented for risks of anesthesia including but not limited to:  - adverse reactions to medications - risk of airway placement if required - damage to eyes, teeth, lips or other oral mucosa - nerve damage due to positioning  - sore throat or hoarseness - Damage to heart, brain, nerves, lungs, other parts of body or loss of life  Patient voiced understanding and assent.)       Anesthesia Quick Evaluation

## 2023-10-29 NOTE — Progress Notes (Addendum)
 Date of Admission:  10/18/2023   T  ID: Danielle Harrison is a 48 y.o. 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)   Abscess of left foot   MRSA bacteremia   Acute osteomyelitis of thoracic spine (HCC)   Acute bacterial endocarditis    Subjective: Pt is feeling better today Some epigastric fullness  Medications:   enoxaparin  (LOVENOX ) injection  40 mg Subcutaneous Q24H   feeding supplement  237 mL Oral BID BM   folic acid   1 mg Oral Daily   guaiFENesin   600 mg Oral BID   insulin  aspart  0-9 Units Subcutaneous TID WC   metoprolol  tartrate  50 mg Oral BID   multivitamin with minerals  1 tablet Oral Daily   sodium chloride   1 g Oral TID WC   thiamine   100 mg Oral Daily   Or   thiamine   100 mg Intravenous Daily    Objective: Vital signs in last 24 hours: Patient Vitals for the past 24 hrs:  BP Temp Temp src Pulse Resp SpO2 Weight  10/29/23 0911 130/85 -- -- (!) 101 (!) 21 97 % --  10/29/23 0900 118/84 -- -- 100 (!) 27 97 % --  10/29/23 0845 111/79 -- -- 89 (!) 25 100 % --  10/29/23 0840 110/72 -- -- 90 (!) 28 100 % --  10/29/23 0831 (!) 89/51 98.2 F (36.8 C) Temporal 99 20 99 % --  10/29/23 0800 (!) 131/98 98.6 F (37 C) Oral 98 19 93 % --  10/29/23 0713 (!) 143/97 99 F (37.2 C) Oral 95 18 99 % --  10/29/23 0611 (!) 142/78 -- -- -- -- -- --  10/29/23 0319 -- -- -- -- -- -- 56.8 kg  10/29/23 0318 (!) 142/99 99.2 F (37.3 C) Oral 95 18 98 % --  10/28/23 1959 (!) 148/93 98.7 F (37.1 C) Oral (!) 107 18 97 % --  10/28/23 1436 109/81 98 F (36.7 C) Oral 92 16 100 % --      PHYSICAL EXAM:  General: Awake.  Alert , no distress Lungs: Clear to auscultation bilaterally. No Wheezing or Rhonchi. No rales. Heart: Regular rate and rhythm, no murmur, rub or gallop. Abdomen: fullness in the  epigastrium Extremities: left foot dressing not removed Wound vac in place  Lab Results    Latest Ref Rng & Units 10/29/2023    4:16 AM 10/28/2023    4:19 AM 10/27/2023    4:35 AM  CBC  WBC 4.0 - 10.5 K/uL 14.9  16.3  16.4   Hemoglobin 12.0 - 15.0 g/dL 16.1  09.6  04.5   Hematocrit 36.0 - 46.0 % 30.7  32.1  31.2   Platelets 150 - 400 K/uL 659  511  278        Latest Ref Rng & Units 10/29/2023    4:16 AM 10/28/2023    4:19 AM 10/28/2023   12:08 AM  CMP  Glucose 70 - 99 mg/dL 409  811  914   BUN 6 - 20 mg/dL 7  13  12    Creatinine 0.44 - 1.00 mg/dL 7.82  9.56  2.13   Sodium 135 - 145 mmol/L 132  133  129   Potassium 3.5 - 5.1 mmol/L 4.0  3.8  4.5   Chloride 98 - 111 mmol/L  95  97  93   CO2 22 - 32 mmol/L 25  26  22    Calcium 8.9 - 10.3 mg/dL 8.5  8.7  8.4   Total Protein 6.5 - 8.1 g/dL   6.2   Total Bilirubin 0.0 - 1.2 mg/dL   0.8   Alkaline Phos 38 - 126 U/L   137   AST 15 - 41 U/L   32   ALT 0 - 44 U/L   19       Microbiology: [facility] 10/23/23 MRSA bacteremia 4/4 10/25/23 MRSA bacteremia 4/4 1/12 BC- gram positive coccci Studies/Results: CT ABDOMEN W CONTRAST Result Date: 10/28/2023 CLINICAL DATA:  Necrotizing pancreatitis EXAM: CT ABDOMEN WITH CONTRAST TECHNIQUE: Multidetector CT imaging of the abdomen was performed using the standard protocol following bolus administration of intravenous contrast. RADIATION DOSE REDUCTION: This exam was performed according to the departmental dose-optimization program which includes automated exposure control, adjustment of the mA and/or kV according to patient size and/or use of iterative reconstruction technique. CONTRAST:  80mL OMNIPAQUE  IOHEXOL  300 MG/ML  SOLN COMPARISON:  10/22/2023 FINDINGS: Lower chest: Small left and trace right pleural effusions with bilateral lower lobe atelectasis. Hepatobiliary: Liver is within normal limits. Gallbladder is unremarkable. No intrahepatic or extrahepatic dilatation. Pancreas: New 2.1 cm low-density lesion in  the pancreatic head (series 2/image 30), suggesting a pseudocyst. Improving inflammatory changes along the pancreatic tail (series 2/image 25), without convincing necrosis. Inferior to the pancreatic tail is a developing well-defined fluid collection (series 2/image 28), suggesting a pseudocyst. Spleen: Within normal limits. Adrenals/Urinary Tract: Adrenal glands are within normal limits. Kidneys are within normal limits.  No hydronephrosis. Stomach/Bowel: Stomach is within normal limits. Visualized bowel is grossly unremarkable. Vascular/Lymphatic: No evidence of abdominal aortic aneurysm. No suspicious abdominal lymphadenopathy. Other: No abdominal ascites. Musculoskeletal: Grade 1 anterolisthesis of L4 on L5. IMPRESSION: Improving pancreatitis, without convincing necrosis, but with developing small pseudocysts as above. Small left and trace right pleural effusions with bilateral lower lobe atelectasis. Electronically Signed   By: Zadie Herter M.D.   On: 10/28/2023 23:30   DG Chest Port 1 View Result Date: 10/28/2023 CLINICAL DATA:  Shortness of breath. EXAM: PORTABLE CHEST 1 VIEW COMPARISON:  Chest radiograph dated 10/23/2023. FINDINGS: Mild peribronchial and interstitial densities may represent reactive small airway disease or viral infection. Clinical correlation recommended no focal consolidation or pneumothorax. Trace bilateral pleural effusions may be present. The cardiac silhouette is within normal limits. No acute osseous pathology. IMPRESSION: Mild peribronchial and interstitial densities may represent reactive small airway disease or viral infection. Electronically Signed   By: Angus Bark M.D.   On: 10/28/2023 14:06     Assessment/Plan: Acute pancreatitis with possible evolving necrosis tail and body  repeat CT yesterday ( 10/28/23) did not show any abscess in pancreas or necrosis- 2 small pseudocyst     MRSA bacteremia- started on 1/9  -- 4 days into hospital stay no blood culture  done on admission so would not know it was POA or noscomial There are metastatic lesions  - thoracic spine infection, left great toe infection with Osteomyelitis - too soon for bacteremia to seed the spine in the hospital TEE negative    what was the original source?  Pt currently on daptomycin , cefepime  and flagyl - DC cefepime  and flagyl  Will need ^ weeks of IV antibiotic from the first negative culture Repeat Blood culture   Left great toe MRSA septic arthritis and local abscess-pathology of met head biopsy shows acute osteomyelitis- going back  for surgery tomorrow      Anemia   Leucoytosis improving  ETOH use   Past h/o transverse myelitis in 2011    Discussed the management with patient.

## 2023-10-30 ENCOUNTER — Encounter: Payer: Self-pay | Admitting: Osteopathic Medicine

## 2023-10-30 ENCOUNTER — Inpatient Hospital Stay: Payer: Medicaid Other

## 2023-10-30 ENCOUNTER — Inpatient Hospital Stay: Payer: Self-pay | Admitting: Anesthesiology

## 2023-10-30 ENCOUNTER — Encounter
Admission: EM | Disposition: A | Discharge status: Home Health | Payer: Self-pay | Source: Home / Self Care | Attending: Student

## 2023-10-30 DIAGNOSIS — K859 Acute pancreatitis without necrosis or infection, unspecified: Secondary | ICD-10-CM | POA: Diagnosis not present

## 2023-10-30 DIAGNOSIS — J45909 Unspecified asthma, uncomplicated: Secondary | ICD-10-CM | POA: Diagnosis not present

## 2023-10-30 DIAGNOSIS — M4624 Osteomyelitis of vertebra, thoracic region: Secondary | ICD-10-CM | POA: Diagnosis not present

## 2023-10-30 DIAGNOSIS — M86172 Other acute osteomyelitis, left ankle and foot: Secondary | ICD-10-CM

## 2023-10-30 DIAGNOSIS — B9562 Methicillin resistant Staphylococcus aureus infection as the cause of diseases classified elsewhere: Secondary | ICD-10-CM | POA: Diagnosis not present

## 2023-10-30 DIAGNOSIS — M00872 Arthritis due to other bacteria, left ankle and foot: Secondary | ICD-10-CM | POA: Diagnosis not present

## 2023-10-30 DIAGNOSIS — R7881 Bacteremia: Secondary | ICD-10-CM | POA: Diagnosis not present

## 2023-10-30 DIAGNOSIS — Z87891 Personal history of nicotine dependence: Secondary | ICD-10-CM | POA: Diagnosis not present

## 2023-10-30 DIAGNOSIS — M86672 Other chronic osteomyelitis, left ankle and foot: Secondary | ICD-10-CM | POA: Diagnosis not present

## 2023-10-30 DIAGNOSIS — M7989 Other specified soft tissue disorders: Secondary | ICD-10-CM | POA: Diagnosis not present

## 2023-10-30 DIAGNOSIS — Z9889 Other specified postprocedural states: Secondary | ICD-10-CM | POA: Diagnosis not present

## 2023-10-30 DIAGNOSIS — M00072 Staphylococcal arthritis, left ankle and foot: Secondary | ICD-10-CM | POA: Diagnosis not present

## 2023-10-30 HISTORY — PX: TRANSMETATARSAL AMPUTATION: SHX6197

## 2023-10-30 HISTORY — PX: INCISION AND DRAINAGE: SHX5863

## 2023-10-30 LAB — CBC
HCT: 32.2 % — ABNORMAL LOW (ref 36.0–46.0)
Hemoglobin: 10.8 g/dL — ABNORMAL LOW (ref 12.0–15.0)
MCH: 33.1 pg (ref 26.0–34.0)
MCHC: 33.5 g/dL (ref 30.0–36.0)
MCV: 98.8 fL (ref 80.0–100.0)
Platelets: 761 10*3/uL — ABNORMAL HIGH (ref 150–400)
RBC: 3.26 MIL/uL — ABNORMAL LOW (ref 3.87–5.11)
RDW: 13.6 % (ref 11.5–15.5)
WBC: 14.2 10*3/uL — ABNORMAL HIGH (ref 4.0–10.5)
nRBC: 0 % (ref 0.0–0.2)

## 2023-10-30 LAB — GLUCOSE, CAPILLARY
Glucose-Capillary: 190 mg/dL — ABNORMAL HIGH (ref 70–99)
Glucose-Capillary: 341 mg/dL — ABNORMAL HIGH (ref 70–99)
Glucose-Capillary: 195 mg/dL — ABNORMAL HIGH (ref 70–99)
Glucose-Capillary: 209 mg/dL — ABNORMAL HIGH (ref 70–99)

## 2023-10-30 LAB — BASIC METABOLIC PANEL
Anion gap: 10 (ref 5–15)
BUN: 8 mg/dL (ref 6–20)
CO2: 25 mmol/L (ref 22–32)
Calcium: 8.8 mg/dL — ABNORMAL LOW (ref 8.9–10.3)
Chloride: 97 mmol/L — ABNORMAL LOW (ref 98–111)
Creatinine, Ser: 0.5 mg/dL (ref 0.44–1.00)
GFR, Estimated: >60 mL/min (ref >60)
Glucose, Bld: 215 mg/dL — ABNORMAL HIGH (ref 70–99)
Potassium: 3.8 mmol/L (ref 3.5–5.1)
Sodium: 132 mmol/L — ABNORMAL LOW (ref 135–145)

## 2023-10-30 LAB — MINIMUM INHIBITORY CONC. (1 DRUG)

## 2023-10-30 LAB — PHOSPHORUS: Phosphorus: 3.2 mg/dL (ref 2.5–4.6)

## 2023-10-30 LAB — OSMOLALITY: Osmolality: 293 mosm/kg (ref 275–295)

## 2023-10-30 LAB — MIC RESULT

## 2023-10-30 LAB — MAGNESIUM: Magnesium: 2.1 mg/dL (ref 1.7–2.4)

## 2023-10-30 SURGERY — INCISION AND DRAINAGE
Anesthesia: General | Site: Toe | Laterality: Left

## 2023-10-30 MED ORDER — BUPIVACAINE HCL 0.5 % IJ SOLN
INTRAMUSCULAR | Status: DC | PRN
  Administered 2023-10-30: 10 mL

## 2023-10-30 MED ORDER — GLYCOPYRROLATE 0.2 MG/ML IJ SOLN
INTRAMUSCULAR | Status: AC
  Filled 2023-10-30: qty 1

## 2023-10-30 MED ORDER — LIDOCAINE HCL (CARDIAC) PF 100 MG/5ML IV SOSY
PREFILLED_SYRINGE | INTRAVENOUS | Status: DC | PRN
  Administered 2023-10-30: 50 mg via INTRAVENOUS

## 2023-10-30 MED ORDER — LIDOCAINE HCL (PF) 1 % IJ SOLN
INTRAMUSCULAR | Status: AC
  Filled 2023-10-30: qty 30

## 2023-10-30 MED ORDER — LIDOCAINE HCL 1 % IJ SOLN
INTRAMUSCULAR | Status: DC | PRN
  Administered 2023-10-30: 10 mL

## 2023-10-30 MED ORDER — MIDAZOLAM HCL 2 MG/2ML IJ SOLN
INTRAMUSCULAR | Status: AC
  Filled 2023-10-30: qty 2

## 2023-10-30 MED ORDER — PROPOFOL 500 MG/50ML IV EMUL
INTRAVENOUS | Status: DC | PRN
  Administered 2023-10-30: 75 ug/kg/min via INTRAVENOUS

## 2023-10-30 MED ORDER — PHENYLEPHRINE 80 MCG/ML (10ML) SYRINGE FOR IV PUSH (FOR BLOOD PRESSURE SUPPORT)
PREFILLED_SYRINGE | INTRAVENOUS | Status: AC
  Filled 2023-10-30: qty 10

## 2023-10-30 MED ORDER — FENTANYL CITRATE (PF) 100 MCG/2ML IJ SOLN: 25.0000 ug | INTRAMUSCULAR | Status: DC | PRN

## 2023-10-30 MED ORDER — GLYCOPYRROLATE 0.2 MG/ML IJ SOLN
INTRAMUSCULAR | Status: DC | PRN
  Administered 2023-10-30: .2 mg via INTRAVENOUS

## 2023-10-30 MED ORDER — 0.9 % SODIUM CHLORIDE (POUR BTL) OPTIME
TOPICAL | Status: DC | PRN
  Administered 2023-10-30: 1000 mL

## 2023-10-30 MED ORDER — DEXMEDETOMIDINE HCL IN NACL 80 MCG/20ML IV SOLN
INTRAVENOUS | Status: DC | PRN
  Administered 2023-10-30: 20 ug via INTRAVENOUS

## 2023-10-30 MED ORDER — VITAMIN C 500 MG PO TABS
500.0000 mg | ORAL_TABLET | Freq: Two times a day (BID) | ORAL | Status: DC
  Administered 2023-10-30 – 2023-11-04 (×10): 500 mg via ORAL
  Filled 2023-10-30 (×10): qty 1

## 2023-10-30 MED ORDER — FENTANYL CITRATE (PF) 100 MCG/2ML IJ SOLN
INTRAMUSCULAR | Status: DC | PRN
  Administered 2023-10-30 (×2): 50 ug via INTRAVENOUS

## 2023-10-30 MED ORDER — PROPOFOL 1000 MG/100ML IV EMUL
INTRAVENOUS | Status: AC
  Filled 2023-10-30: qty 100

## 2023-10-30 MED ORDER — PROPOFOL 10 MG/ML IV BOLUS
INTRAVENOUS | Status: DC | PRN
  Administered 2023-10-30: 40 mg via INTRAVENOUS

## 2023-10-30 MED ORDER — OXYCODONE HCL 5 MG/5ML PO SOLN: 5.0000 mg | Freq: Once | ORAL | Status: DC | PRN

## 2023-10-30 MED ORDER — INSULIN GLARGINE-YFGN 100 UNIT/ML ~~LOC~~ SOLN
8.0000 [IU] | Freq: Every day | SUBCUTANEOUS | Status: DC
  Administered 2023-10-30 – 2023-11-04 (×6): 8 [IU] via SUBCUTANEOUS
  Filled 2023-10-30 (×6): qty 0.08

## 2023-10-30 MED ORDER — PHENYLEPHRINE 80 MCG/ML (10ML) SYRINGE FOR IV PUSH (FOR BLOOD PRESSURE SUPPORT)
PREFILLED_SYRINGE | INTRAVENOUS | Status: DC | PRN
  Administered 2023-10-30 (×4): 160 ug via INTRAVENOUS

## 2023-10-30 MED ORDER — BUPIVACAINE HCL (PF) 0.5 % IJ SOLN
INTRAMUSCULAR | Status: AC
  Filled 2023-10-30: qty 30

## 2023-10-30 MED ORDER — ONDANSETRON HCL 4 MG/2ML IJ SOLN: 4.0000 mg | Freq: Once | INTRAMUSCULAR | Status: DC | PRN

## 2023-10-30 MED ORDER — MIDAZOLAM HCL 2 MG/2ML IJ SOLN
INTRAMUSCULAR | Status: DC | PRN
  Administered 2023-10-30: 2 mg via INTRAVENOUS

## 2023-10-30 MED ORDER — SODIUM CHLORIDE 0.9 % IV SOLN: INTRAVENOUS | Status: DC | PRN

## 2023-10-30 MED ORDER — OXYCODONE HCL 5 MG PO TABS: 5.0000 mg | ORAL_TABLET | Freq: Once | ORAL | Status: DC | PRN

## 2023-10-30 MED ORDER — LIDOCAINE HCL (PF) 2 % IJ SOLN
INTRAMUSCULAR | Status: AC
  Filled 2023-10-30: qty 5

## 2023-10-30 MED ORDER — FENTANYL CITRATE (PF) 100 MCG/2ML IJ SOLN
INTRAMUSCULAR | Status: AC
  Filled 2023-10-30: qty 2

## 2023-10-30 SURGICAL SUPPLY — 69 items
BLADE MED AGGRESSIVE (BLADE) ×2 IMPLANT
BLADE OSC/SAGITTAL MD 5.5X18 (BLADE) IMPLANT
BLADE SURG 15 STRL LF DISP TIS (BLADE) ×4 IMPLANT
BLADE SURG MINI STRL (BLADE) ×2 IMPLANT
BNDG COHESIVE 4X5 TAN STRL LF (GAUZE/BANDAGES/DRESSINGS) ×2 IMPLANT
BNDG COHESIVE 6X5 TAN ST LF (GAUZE/BANDAGES/DRESSINGS) ×2 IMPLANT
BNDG ELASTIC 4INX 5YD STR LF (GAUZE/BANDAGES/DRESSINGS) ×2 IMPLANT
BNDG ESMARCH 4X12 STRL LF (GAUZE/BANDAGES/DRESSINGS) ×2 IMPLANT
BNDG GAUZE DERMACEA FLUFF 4 (GAUZE/BANDAGES/DRESSINGS) ×2 IMPLANT
BNDG STRETCH GAUZE 3IN X12FT (GAUZE/BANDAGES/DRESSINGS) ×2 IMPLANT
BONE CEMENT GENTAMICIN (Cement) ×2 IMPLANT
CEMENT BONE GENTAMICIN 40 (Cement) IMPLANT
CNTNR URN SCR LID CUP LEK RST (MISCELLANEOUS) IMPLANT
CUFF TOURN SGL QUICK 12 (TOURNIQUET CUFF) IMPLANT
CUFF TOURN SGL QUICK 18X4 (TOURNIQUET CUFF) IMPLANT
CUFF TRNQT CYL 24X4X16.5-23 (TOURNIQUET CUFF) IMPLANT
DRAPE FLUOR MINI C-ARM 54X84 (DRAPES) IMPLANT
DRSG ADAPTIC 3X8 NADH LF (GAUZE/BANDAGES/DRESSINGS) ×2 IMPLANT
DRSG EMULSION OIL 3X3 NADH (GAUZE/BANDAGES/DRESSINGS) IMPLANT
DRSG EMULSION OIL 3X8 NADH (GAUZE/BANDAGES/DRESSINGS) ×2 IMPLANT
DURAPREP 26ML APPLICATOR (WOUND CARE) ×2 IMPLANT
ELECT REM PT RETURN 9FT ADLT (ELECTROSURGICAL) ×2
ELECTRODE REM PT RTRN 9FT ADLT (ELECTROSURGICAL) ×2 IMPLANT
GAUZE PACKING 0.25INX5YD STRL (GAUZE/BANDAGES/DRESSINGS) IMPLANT
GAUZE PAD ABD 8X10 STRL (GAUZE/BANDAGES/DRESSINGS) ×2 IMPLANT
GAUZE SPONGE 4X4 12PLY STRL (GAUZE/BANDAGES/DRESSINGS) ×4 IMPLANT
GAUZE STRETCH 2X75IN STRL (MISCELLANEOUS) ×2 IMPLANT
GAUZE XEROFORM 1X8 LF (GAUZE/BANDAGES/DRESSINGS) ×2 IMPLANT
GLOVE BIO SURGEON STRL SZ7.5 (GLOVE) ×4 IMPLANT
GLOVE BIOGEL M STRL SZ7.5 (GLOVE) ×2 IMPLANT
GLOVE BIOGEL PI IND STRL 7.5 (GLOVE) ×2 IMPLANT
GLOVE BIOGEL PI IND STRL 8 (GLOVE) ×2 IMPLANT
GLOVE PI ORTHO PRO STRL 7.5 (GLOVE) ×2 IMPLANT
GLOVE SKINSENSE STRL SZ7.5 (GLOVE) ×2 IMPLANT
GOWN STRL REUS W/ TWL LRG LVL3 (GOWN DISPOSABLE) IMPLANT
GOWN STRL REUS W/ TWL XL LVL3 (GOWN DISPOSABLE) ×2 IMPLANT
GOWN STRL REUS W/TWL MED LVL3 (GOWN DISPOSABLE) ×2 IMPLANT
HANDPIECE VERSAJET DEBRIDEMENT (MISCELLANEOUS) IMPLANT
IV NS 1000ML BAXH (IV SOLUTION) ×2 IMPLANT
IV NS IRRIG 3000ML ARTHROMATIC (IV SOLUTION) IMPLANT
KIT TURNOVER KIT A (KITS) ×2 IMPLANT
LABEL OR SOLS (LABEL) ×2 IMPLANT
MANIFOLD NEPTUNE II (INSTRUMENTS) ×2 IMPLANT
NDL FILTER BLUNT 18X1 1/2 (NEEDLE) ×2 IMPLANT
NDL HYPO 25X1 1.5 SAFETY (NEEDLE) ×4 IMPLANT
NEEDLE FILTER BLUNT 18X1 1/2 (NEEDLE)
NEEDLE HYPO 25X1 1.5 SAFETY (NEEDLE)
NS IRRIG 500ML POUR BTL (IV SOLUTION) ×2 IMPLANT
PACK EXTREMITY ARMC (MISCELLANEOUS) ×2 IMPLANT
PACKING GAUZE IODOFORM 1INX5YD (GAUZE/BANDAGES/DRESSINGS) IMPLANT
PAD ABD DERMACEA PRESS 5X9 (GAUZE/BANDAGES/DRESSINGS) ×2 IMPLANT
PULSAVAC PLUS IRRIG FAN TIP (DISPOSABLE) ×2
SOL PREP PVP 2OZ (MISCELLANEOUS) ×6
SOLUTION PREP PVP 2OZ (MISCELLANEOUS) ×2 IMPLANT
STAPLER SKIN PROX 35W (STAPLE) ×2 IMPLANT
STOCKINETTE IMPERVIOUS 9X36 MD (GAUZE/BANDAGES/DRESSINGS) ×2 IMPLANT
SUT ETHILON 2 0 FS 18 (SUTURE) IMPLANT
SUT ETHILON 4-0 FS2 18XMFL BLK (SUTURE)
SUT MNCRL AB 3-0 PS2 27 (SUTURE) ×2 IMPLANT
SUT PROLENE 3 0 PS 2 (SUTURE) ×2 IMPLANT
SUT VIC AB 3-0 SH 27X BRD (SUTURE) IMPLANT
SUT VIC AB 4-0 FS2 27 (SUTURE) IMPLANT
SUTURE ETHLN 4-0 FS2 18XMF BLK (SUTURE) IMPLANT
SWAB CULTURE AMIES ANAERIB BLU (MISCELLANEOUS) IMPLANT
SYR 10ML LL (SYRINGE) ×4 IMPLANT
TIP BRUSH PULSAVAC PLUS 24.33 (MISCELLANEOUS) IMPLANT
TIP FAN IRRIG PULSAVAC PLUS (DISPOSABLE) IMPLANT
TRAP FLUID SMOKE EVACUATOR (MISCELLANEOUS) ×2 IMPLANT
WATER STERILE IRR 500ML POUR (IV SOLUTION) ×2 IMPLANT

## 2023-10-30 NOTE — Progress Notes (Addendum)
PROGRESS NOTE    Danielle Harrison  ZOX:[id number] DOB: 18-Jun-1976 DOA: 10/18/2023 PCP: [facility]  201A/201A-AA  LOS: 12 days   Brief hospital course: Danielle Harrison is a 48 y.o. female with medical history Anxiety, Asthma, HLD, HTN restless leg syndrome, etoh abuse, hx of etoh pancreatitis who presents to ED with n/v/ severe abdominal pain x 48 hours.  She notes  pain mid to lower abdomen and radiates towards her back. She note no fever /chills/ chest pain or sob. Patient also noted normal bowel movements, no diarrhea or constipation. Notes n/v improved s/p treatment in ED.  Patient was found to have alcoholic necrotic pancreatitis, MRSA bacteremia most likely due to osteomyelitis of T6-T9, left foot inflammation and left great toe septic arthritis.  ID consulted, patient was on vancomycin and Zosyn, which was switched to cefepime and Flagyl.    Assessment & Plan:  Acute ETOH pancreatitis  With developing necrosis --continued to have abdominal pain.  Started having fevers.  Repeat CT a/p showed finding concerning for developing necrosis.  No organized fluid collection. --abd pain improving and is tolerating full liquids -General Surgery following recommended transfer to tertiary care center.  [facility] and [facility] declined due to capacity.  General surgery signed off. On 1/12 case was discussed with Dr. Cliffton Asters at [facility], recommended conservative management and antibiotics, no need of surgical intervention at this time unless there is a large infected fluid collection. - s/p vanc, and Zosyn.  1/12 started cefepime and Flagyl d/c'd on 1/15 1/13 stared Dapto as per ID As per ID patient will need 6 weeks of antibiotics 1/14 tolerating full liquid diet, advance to soft diet, advised to start with small meals and advance as per tolerance. 1/14 CT A/P: Improving pancreatitis, without convincing necrosis, but with developing small pseudocysts as above. Small left and trace right pleural  effusions with bilateral lower lobe atelectasis.   Sepsis due to MRSA bacteremia Hemodynamically stable. Patient denies hardware, hx ivdu, new piv yesterday S/p vanc and Zosyn, from 1/12 -1/15  cefepime and Flagyl. 1/13 started daptomycin - ID to see - TTE LVEF 60-65%, aortic valve thickening, recommended TEE to rule out endocarditis Cardiology consulted for TEE, scheduled for tomorrow, keep n.p.o. 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 Follow fluid culture ID following 1/16 s/p 1. Resection of first metatarsal phalangeal joint including head of first metatarsal and base of proximal phalanx, left 2.  Insertion of antibiotic cement spacer, left foot 3.  Delayed primary closure of the surgical wound, 5 x 1 cm, left foot     # Focal small bowel intussusception in the left mid-abdomen  --Per [place], likely incidental, No clinical evidence of bowel obstruction or ischemia. No surgical intervention is needed from that standpoint either.  --repeat CT a/p showed the same. Plan: --Short interval follow up CT is recommended to ensure resolution and exclude a neoplastic lead point. 1/14 repeat CT a/p did not show any abnormality in the bowels.  Acute metabolic acidosis  Lactic acidosis --resolved after IVF   Hyponatremia, presumed due to pancreatitis --s/p ivf 1/15 started sodium chloride tablets 1 g p.o. 3 times daily for 2 days  Hypokalemia, potassium repleted Hypophosphatemia, Phos  repleted Monitor electrolytes and replete as needed  Hyperglycemia  Prediabetes --A1c 6.3   Hx of HTN 1/13 metoprolol 50 mg p.o. twice daily was started 1/16 BP soft possible due to anesthesia, continue to monitor.   HLD not taking medications due to loss of insurance Follow outpatient   ETOH  --no signs of withdrawal   Reactive ileus and constipation --resolved with aggressive Miralax  Thrombocytosis most likely reactive due to infection Monitor platelet count daily   DVT prophylaxis: Lovenox SQ Code Status: Full code  Family Communication:  Level of care: Med-Surg Dispo:   The patient is from: home Anticipated d/c is to: home Anticipated d/c date is: to be determined 1/11 declined by [facility] due to capacity  1/12 declined by [facility] and [facility] due to capacity.   Subjective and Interval History:  No significant events overnight, patient was seen after the surgery of left foot.  Tolerated procedure well, pain is under control at this time.  Patient was resting comfortably, denied any active issues.    Objective: Vitals:   10/30/23 0820 10/30/23 0830 10/30/23 0845 10/30/23 1549  BP: 96/63 94/63 98/69  104/70  Pulse: 95 94 99 (!) 102  Resp: (!) 21 (!) 23 19 16   Temp: 97.9 F (36.6 C)  98.2 F (36.8 C) 99.2 F (37.3 C)  TempSrc:    Oral  SpO2: 96% 94% 96% 99%  Weight:      Height:        Intake/Output Summary (Last 24 hours) at 10/30/2023 1724 Last data filed at 10/30/2023 1546 Gross per 24 hour  Intake 1020 ml  Output 5 ml  Net 1015 ml   Filed Weights   10/28/23 0703 10/29/23 0319 10/30/23 0411  Weight: 57.2 kg 56.8 kg 57.4 kg    Examination:   Constitutional: NAD, AAOx3 HEENT: conjunctivae and lids normal, EOMI CV: No cyanosis.  Tachycardic, rr RESP: normal respiratory effort, on RA Neuro: II - XII grossly intact.   Msk: no spinous process tencerness. Mild swelling left first mtp LE: s/p left foot sx, dressing CDI  Data Reviewed: I have personally  reviewed labs and imaging studies  CRITICAL CARE Performed by: Gillis Santa   Total critical care time: 40 minutes  Critical care time was exclusive of separately billable procedures and treating other patients.  Critical care was necessary to treat or prevent imminent or life-threatening deterioration.  Critical care was time spent personally by me on the following activities: development of treatment plan with patient and/or surrogate as well as nursing, discussions with consultants, evaluation of patient's response to treatment, examination of patient, obtaining history from patient or surrogate, ordering and performing treatments and interventions, ordering and review of laboratory studies, ordering and review of radiographic studies, pulse oximetry and re-evaluation of patient's condition.    Gillis Santa, MD [facility] If 7PM-7AM, please contact night-coverage 10/30/2023, 5:24 PM

## 2023-10-30 NOTE — Anesthesia Preprocedure Evaluation (Signed)
Anesthesia Evaluation  Patient identified by MRN, date of birth, ID band Patient awake    Reviewed: Allergy & Precautions, NPO status , Patient's Chart, lab work & pertinent test results  History of Anesthesia Complications Negative for: history of anesthetic complications  Airway Mallampati: III  TM Distance: <3 FB Neck ROM: full    Dental  (+) Chipped, Poor Dentition   Pulmonary asthma , neg sleep apnea, neg COPD, Patient abstained from smoking.Not current smoker, former smoker   Pulmonary exam normal breath sounds clear to auscultation       Cardiovascular Exercise Tolerance: Good METS(-) hypertension(-) CAD and (-) Past MI negative cardio ROS Normal cardiovascular exam(-) dysrhythmias  Rhythm:Regular Rate:Normal - Systolic murmurs TEE unremarkable    Neuro/Psych  PSYCHIATRIC DISORDERS Anxiety      Neuromuscular disease    GI/Hepatic negative GI ROS,neg GERD  ,,(+)     substance abuse  alcohol useNecrotizing pancreatitis    Endo/Other  negative endocrine ROSneg diabetes    Renal/GU negative Renal ROS  negative genitourinary   Musculoskeletal  (+) Arthritis ,    Abdominal   Peds  Hematology negative hematology ROS (+) MRSA bacteremia, 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   Anesthesia Other Findings Patient is NPO appropriate and reports no nausea or vomiting today or yesterday  Past Medical History: No date: Anxiety No date: Arthritis     Comment:  back No date: Asthma No date: Neuromuscular disorder (HCC)     Comment:  weakness and numbness  No date: Restless leg syndrome  Past Surgical History: 06/28/2019: BIOPSY     Comment:  Procedure: BIOPSY;  Surgeon: Pasty Spillers, MD;                Location: [facility];  Service: Endoscopy;; No date: CESAREAN SECTION     Comment:  x2 06/28/2019: COLONOSCOPY WITH PROPOFOL; N/A     Comment:  Procedure: COLONOSCOPY WITH PROPOFOL;  Surgeon:                Pasty Spillers, MD;  Location: [facility];              Service: Endoscopy;  Laterality: N/A; 06/28/2019: ESOPHAGOGASTRODUODENOSCOPY (EGD) WITH PROPOFOL; N/A     Comment:  Procedure: ESOPHAGOGASTRODUODENOSCOPY (EGD) WITH               PROPOFOL;  Surgeon: Pasty Spillers, MD;  Location:               [facility];  Service: Endoscopy;  Laterality:               N/A; 10/27/2023: INCISION AND DRAINAGE; Left     Comment:  Procedure: INCISION AND DRAINAGE LEFT FOOT;  Surgeon:               Pilar Plate, DPM;  Location: [facility];                Service: Orthopedics/Podiatry;  Laterality: Left; 10/27/2023: TRANSMETATARSAL AMPUTATION; Left     Comment:  Procedure: LEFT FOOT 1ST MPJ RESECTION;  Surgeon:               Pilar Plate, DPM;  Location: [facility];                Service: Orthopedics/Podiatry;  Laterality: Left;  BMI    Body Mass Index: 24.46 kg/m      Reproductive/Obstetrics negative OB ROS  Anesthesia Physical Anesthesia Plan  ASA: 3  Anesthesia Plan: General   Post-op Pain Management: Minimal or no pain anticipated and Ofirmev IV (intra-op)*   Induction: Intravenous  PONV Risk Score and Plan: 2 and Propofol infusion, TIVA, Ondansetron, Midazolam, Treatment may vary due to age or medical condition and Dexamethasone  Airway Management Planned: Nasal Cannula  Additional Equipment: None  Intra-op Plan:   Post-operative Plan:   Informed Consent: I have reviewed the patients History and Physical, chart, labs and discussed the procedure including the risks, benefits and alternatives for the proposed anesthesia with the patient or authorized representative who has indicated his/her understanding and acceptance.     Dental advisory given  Plan Discussed with: CRNA and Surgeon  Anesthesia Plan Comments: (Discussed risks of anesthesia with patient, including possibility of  difficulty with spontaneous ventilation under anesthesia necessitating airway intervention, PONV, and rare risks such as cardiac or respiratory or neurological events, and allergic reactions. Discussed the role of CRNA in patient's perioperative care. Patient understands.)        Anesthesia Quick Evaluation

## 2023-10-30 NOTE — Transfer of Care (Signed)
Immediate Anesthesia Transfer of Care Note  Patient: Danielle Harrison  Procedure(s) Performed: LEFT FOOT FIRST METATARSAL PHALANGEAL JOINT RESECTION, ANTIBIOTIC SPACER, WASHOUT, WOUND CLOSURE (Left) LEFT FOOT 1ST MPJ RESECTION (Left: Toe)  Patient Location: PACU  Anesthesia Type:General  Level of Consciousness: awake, alert , and oriented  Airway & Oxygen Therapy: Patient Spontanous Breathing  Post-op Assessment: Report given to RN and Post -op Vital signs reviewed and stable  Post vital signs: Reviewed  Last Vitals:  Vitals Value Taken Time  BP 96/63   Temp    Pulse 95 10/30/23 0818  Resp 13 10/30/23 0818  SpO2 96 % 10/30/23 0818  Vitals shown include unfiled device data.  Last Pain:      Patients Stated Pain Goal: 0 (10/30/23 0424)  Complications: No notable events documented.

## 2023-10-30 NOTE — Op Note (Signed)
Full Operative Report  Date of Operation: 1:31 PM, 10/30/2023   Patient: Danielle Harrison - 48 y.o. female  Surgeon: Pilar Plate, DPM   Assistant: None  Diagnosis: LEFT FOOT SEPTIC ARTHRITIS, osteomyelitis first MPJ, left foot  Procedure:  1.  Resection of first metatarsal phalangeal joint including head of first metatarsal and base of proximal phalanx, left 2.  Insertion of antibiotic cement spacer, left foot 3.  Delayed primary closure of the surgical wound, 5 x 1 cm, left foot    Anesthesia: General  No responsible provider has been recorded for the case.  Anesthesiologist: Corinda Gubler, MD CRNA: Mathews Argyle, CRNA   Estimated Blood Loss: Minimal   Hemostasis: 1) Anatomical dissection, mechanical compression, electrocautery 2) ankle tourniquet inflated to 250 mmHg for 30 minutes  Implants: Implant Name Type Inv. Item Serial No. Manufacturer Lot No. LRB No. Used Action  BONE CEMENT GENTAMICIN - [id number] Cement BONE CEMENT GENTAMICIN  [employer] [id number] Left 1 Implanted    Materials: Prolene and Monocryl 3-0  Injectables: 1) Pre-operatively: 20 cc of 50:50 mixture 1%lidocaine plain and 0.5% marcaine plain 2) Post-operatively: None   Specimens: Pathology: First metatarsal head and base of first proximal phalanx for pathology microbiology: Bone culture from first metatarsal head for micro   Antibiotics: IV antibiotics given per schedule on the floor  Drains: None  Complications: Patient tolerated the procedure well without complication.   Operative findings: As below in detailed report  Indications for Procedure: Danielle Harrison presents to [name], [name], [place] with a chief complaint of redness swelling drainage prior abscess of left foot status post I&D 3 days ago.  Now presenting for planned return to the OR for repeat washout and first MPJ resection as prior bone biopsy positive for osteomyelitis.  The patient has failed conservative  treatments of various modalities. At this time the patient has elected to proceed with surgical correction. All alternatives, risks, and complications of the procedures were thoroughly explained to the patient. Patient exhibits appropriate understanding of all discussion points and informed consent was signed and obtained in the chart with no guarantees to surgical outcome given or implied.  Description of Procedure: Patient was brought to the operating room. Patient remained on their hospital bed in the supine position. A surgical timeout was performed and all members of the operating room, the procedure, and the surgical site were identified. anesthesia occurred as per anesthesia record. Local anesthetic as previously described was then injected about the operative field in a local infiltrative block.  The operative lower extremity as noted above was then prepped and draped in the usual sterile manner. The following procedure then began.  Attention was directed to the first metatarsal phalangeal joint.  Prior surgical incision was measured and noted to be approximately 5 x 1 cm.  Next prior antibiotic beads and any necrotic or fibrotic tissues were excised to the level of bone with a 15 blade and rongeur.  Bone was cleaned and prepared for resection with removal of periosteum proximally and distally.  Next with the use of a sagittal saw a transverse osteotomy was made at the distal metadiaphysis of the first metatarsal.  The bone at the transection site appeared to be of healthy quality.  An additional transverse osteotomy was made at the base of the proximal phalanx approximately 1 cm from the joint.  The margins were marked with ink both proximal and distal.  Next a rongeur was used to harvest a bone culture from the  first metatarsal head.  This was passed and sent to micro.  The remaining bone was sent to pathology.  Next the surgical cavity was examined and the sesamoids were resected.  They did not  appear infected.  Next the surgical cavity was irrigated with 3 L of sterile saline via power pulse lavage.  There was no further necrotic tissues or purulence noted from about the first MPJ.  Next antibiotic cement spacer with premix gentamicin antibiotics already and the cement was mixed on the back table.  This was then formed into a cylindrical shape and placed after hardening into the surgical bone defect.  This helped to maintain the length of the first ray without undue tension on the first toe.  Next the surgical site was again measured and measured approximately 5 x 1 cm length.  This was then closed in delayed primary layered fashion with 3-0 Monocryl and 3-0 Prolene under minimal tension.  The surgical site was then dressed with Betadine Adaptic 4 x 4 ABD pad Kerlix Ace wrap. The patient tolerated both the procedure and anesthesia well with vital signs stable throughout. The patient was transferred in good condition and all vital signs stable  from the OR to recovery under the discretion of anesthesia.  Condition: Vital signs stable, neurovascular status unchanged from preoperative   Surgical plan:  No further surgical plans.  Follow-up surgical pathology to identify if margins are clean.  Follow bone culture.  Weightbearing as tolerated in postop shoe to the left foot though minimizes much as possible.  The patient will be weightbearing as tolerated in a postop shoe to the operative limb until further instructed. The dressing is to remain clean, dry, and intact. Will continue to follow unless noted elsewhere.   [name], DPM [facility]

## 2023-10-30 NOTE — Anesthesia Postprocedure Evaluation (Signed)
Anesthesia Post Note  Patient: Danielle Harrison  Procedure(s) Performed: LEFT FOOT FIRST METATARSAL PHALANGEAL JOINT RESECTION, ANTIBIOTIC SPACER, WASHOUT, WOUND CLOSURE (Left) LEFT FOOT 1ST MPJ RESECTION (Left: Toe)  Patient location during evaluation: PACU Anesthesia Type: General Level of consciousness: awake and alert Pain management: pain level controlled Vital Signs Assessment: post-procedure vital signs reviewed and stable Respiratory status: spontaneous breathing, nonlabored ventilation, respiratory function stable and patient connected to nasal cannula oxygen Cardiovascular status: blood pressure returned to baseline and stable Postop Assessment: no apparent nausea or vomiting Anesthetic complications: no   No notable events documented.   Last Vitals:  Vitals:   10/30/23 0830 10/30/23 0845  BP: 94/63 98/69  Pulse: 94 99  Resp: (!) 23 19  Temp:  36.8 C  SpO2: 94% 96%    Last Pain:  Vitals:   10/30/23 1112  TempSrc:   PainSc: Asleep                 Corinda Gubler

## 2023-10-30 NOTE — Plan of Care (Signed)
  Problem: Education: Goal: Ability to describe self-care measures that may prevent or decrease complications (Diabetes Survival Skills Education) will improve Outcome: Progressing Goal: Individualized Educational Video(s) Outcome: Progressing   Problem: Coping: Goal: Ability to adjust to condition or change in health will improve Outcome: Progressing   Problem: Fluid Volume: Goal: Ability to maintain a balanced intake and output will improve Outcome: Progressing   Problem: Health Behavior/Discharge Planning: Goal: Ability to identify and utilize available resources and services will improve Outcome: Progressing Goal: Ability to manage health-related needs will improve Outcome: Not Progressing   Problem: Nutritional: Goal: Maintenance of adequate nutrition will improve Outcome: Progressing Goal: Progress toward achieving an optimal weight will improve Outcome: Progressing   Problem: Skin Integrity: Goal: Risk for impaired skin integrity will decrease Outcome: Progressing   Problem: Education: Goal: Knowledge of General Education information will improve Description: Including pain rating scale, medication(s)/side effects and non-pharmacologic comfort measures Outcome: Progressing   Problem: Health Behavior/Discharge Planning: Goal: Ability to manage health-related needs will improve Outcome: Progressing   Problem: Clinical Measurements: Goal: Ability to maintain clinical measurements within normal limits will improve Outcome: Progressing Goal: Will remain free from infection Outcome: Progressing Goal: Diagnostic test results will improve Outcome: Progressing Goal: Respiratory complications will improve Outcome: Progressing Goal: Cardiovascular complication will be avoided Outcome: Progressing   Problem: Activity: Goal: Risk for activity intolerance will decrease Outcome: Progressing   Problem: Nutrition: Goal: Adequate nutrition will be maintained Outcome:  Progressing   Problem: Coping: Goal: Level of anxiety will decrease Outcome: Progressing   Problem: Elimination: Goal: Will not experience complications related to bowel motility Outcome: Progressing Goal: Will not experience complications related to urinary retention Outcome: Progressing   Problem: Pain Management: Goal: General experience of comfort will improve Outcome: Progressing   Problem: Safety: Goal: Ability to remain free from injury will improve Outcome: Progressing   Problem: Skin Integrity: Goal: Risk for impaired skin integrity will decrease Outcome: Progressing

## 2023-10-30 NOTE — Progress Notes (Signed)
The patient has remained NPO since midnight in preparation for the procedure. The procedure consent signed and pt is confirming that she understands and agrees to the planned treatment. In addition, oral care has been performed and CHG cleansing has been completed to reduce the risk of infection and ensure the patient is fully prepared

## 2023-10-30 NOTE — Progress Notes (Signed)
Nutrition Follow Up Note   DOCUMENTATION CODES:   Not applicable  INTERVENTION:   Ensure Enlive po BID, each supplement provides 350 kcal and 20 grams of protein.  MVI, folic acid and thiamine po daily   Vitamin C 500mg  po BID   Daily weights   NUTRITION DIAGNOSIS:   Inadequate oral intake related to acute illness as evidenced by other (comment) (pt on NPO/liquid diet for > 7 days) -improving   GOAL:   Patient will meet greater than or equal to 90% of their needs -progressing   MONITOR:   PO intake, Supplement acceptance, Labs, Weight trends, Skin, I & O's  ASSESSMENT:   48 y/o female with h/o etoh abuse, HLD and anxiety who is admitted with acute necrotizing pancreatitis with incidental finding of small bowel intussusception, MRSA bacteremia, left foot osteomyelitis/septic arthritis and concerns for possible osteomyelitis of her thoracic spine now s/p I & D left foot, bone biopsy and VAC placement 1/13.  -Pt s/p TEE 1/15 -Pt s/p 1st MPJ resection 1/16  Pt with improved appetite and oral intake; pt eating 25-100% of meals and is drinking the Ensure supplements. RD will add vitamin C to support wound healing. Refeed labs stable. Per chart, pt is up ~6lbs since admission.    Medications reviewed and include: dulcolax, lovenox, folic acid, insulin, MVI, miralax, NaCl tabs, thiamine, daptomycin   Labs reviewed: Na 132(L), K 3.8 wnl, P 3.2 wnl, Mg 2.1 wnl Wbc- 14.2(H), Hgb 10.8(H), Hct 32.2(L) Cbgs- 195, 341, 190 x 24 hrs   Diet Order:   Diet Order             DIET SOFT Room service appropriate? Yes; Fluid consistency: Thin  Diet effective now                  EDUCATION NEEDS:   Education needs have been addressed  Skin:  Skin Assessment: Reviewed RN Assessment (Incision L foot)  Last BM:  1/15- type 4  Height:   Ht Readings from Last 1 Encounters:  10/18/23 5' (1.524 m)    Weight:   Wt Readings from Last 1 Encounters:  10/30/23 57.4 kg    Ideal  Body Weight:  45.45 kg  BMI:  Body mass index is 24.71 kg/m.  Estimated Nutritional Needs:   Kcal:  1500-1700kcal/day  Protein:  75-85g/day  Fluid:  1.4-1.6L/day  Betsey Holiday MS, RD, LDN If unable to be reached, please send secure chat to "RD inpatient" available from 8:00a-4:00p daily

## 2023-10-30 NOTE — Progress Notes (Signed)
Date of Admission:  10/18/2023   T  ID: Danielle Harrison is a 48 y.o. 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)   Abscess of left foot   MRSA bacteremia   Acute osteomyelitis of thoracic spine (HCC)   Acute bacterial endocarditis   Acute osteomyelitis of left foot (HCC)    Subjective: Underwent surgery  Medications:   bisacodyl  10 mg Oral QHS   Chlorhexidine Gluconate Cloth  6 each Topical Q0600   enoxaparin (LOVENOX) injection  40 mg Subcutaneous Q24H   feeding supplement  237 mL Oral BID BM   folic acid  1 mg Oral Daily   guaiFENesin  600 mg Oral BID   insulin aspart  0-9 Units Subcutaneous TID WC   metoprolol tartrate  50 mg Oral BID   multivitamin with minerals  1 tablet Oral Daily   mupirocin ointment  1 Application Nasal BID   polyethylene glycol  17 g Oral BID   sodium chloride  1 g Oral TID WC   thiamine  100 mg Oral Daily   Or   thiamine  100 mg Intravenous Daily    Objective: Vital signs in last 24 hours: Patient Vitals for the past 24 hrs:  BP Temp Temp src Pulse Resp SpO2 Weight  10/30/23 0845 98/69 98.2 F (36.8 C) -- 99 19 96 % --  10/30/23 0830 94/63 -- -- 94 (!) 23 94 % --  10/30/23 0820 96/63 97.9 F (36.6 C) -- 95 (!) 21 96 % --  10/30/23 0650 120/77 98.5 F (36.9 C) Oral 99 18 99 % --  10/30/23 0413 (!) 127/94 99.1 F (37.3 C) Oral 94 18 100 % --  10/30/23 0411 -- -- -- -- -- -- 57.4 kg  10/29/23 1939 135/87 99.6 F (37.6 C) Oral (!) 105 18 98 % --  10/29/23 1545 -- 99.3 F (37.4 C) -- -- -- -- --  10/29/23 1527 125/86 (!) 100.5 F (38.1 C) Oral 78 16 (!) 69 % --      PHYSICAL EXAM:  General: Awake.  Alert , no distress Lungs: Clear to auscultation bilaterally. No Wheezing or Rhonchi. No rales. Heart: Regular rate and rhythm, no murmur, rub or gallop. Abdomen:  fullness in the epigastrium Extremities: left foot dressing not removed Wound vac in place  Lab Results    Latest Ref Rng & Units 10/30/2023    5:43 AM 10/29/2023    4:16 AM 10/28/2023    4:19 AM  CBC  WBC 4.0 - 10.5 K/uL 14.2  14.9  16.3   Hemoglobin 12.0 - 15.0 g/dL 32.9  51.8  84.1   Hematocrit 36.0 - 46.0 % 32.2  30.7  32.1   Platelets 150 - 400 K/uL 761  659  511        Latest Ref Rng & Units 10/30/2023    5:43 AM 10/29/2023    4:16 AM 10/28/2023    4:19 AM  CMP  Glucose 70 - 99 mg/dL 660  630  160   BUN 6 - 20 mg/dL 8  7  13    Creatinine 0.44 - 1.00 mg/dL 1.09  3.23  5.57   Sodium 135 - 145 mmol/L 132  132  133   Potassium 3.5 - 5.1 mmol/L 3.8  4.0  3.8   Chloride 98 -  111 mmol/L 97  95  97   CO2 22 - 32 mmol/L 25  25  26    Calcium 8.9 - 10.3 mg/dL 8.8  8.5  8.7       Microbiology: [facility] 10/23/23 MRSA bacteremia 4/4 10/25/23 MRSA bacteremia 4/4 1/12 BC- gram positive coccci Studies/Results: DG Foot 2 Views Left Result Date: 10/30/2023 CLINICAL DATA:  Postoperative. EXAM: LEFT FOOT - 2 VIEW COMPARISON:  Left foot radiograph dated 10/24/2023. FINDINGS: Status post resection and cement of the first MTP joint. No acute fracture or dislocation. There is soft tissue swelling of the forefoot. Overlying dressing noted. IMPRESSION: Postsurgical changes of the first MTP joint. Electronically Signed   By: Elgie Collard M.D.   On: 10/30/2023 13:00   ECHO TEE Result Date: 10/29/2023    TRANSESOPHOGEAL ECHO REPORT   Patient Name:   Danielle Harrison Date of Exam: 10/29/2023 Medical Rec #:  [id number]       Height:       60.0 in Accession #:    [id number]      Weight:       125.2 lb Date of Birth:  1976/08/21       BSA:          1.530 m Patient Age:    47 years        BP:           143/97 mmHg Patient Gender: F               HR:           95 bpm. Exam Location:  [facility] Procedure: Transesophageal Echo, Cardiac Doppler, Color Doppler and Saline            Contrast Bubble Study Indications:      Bacteremia R78.81  History:         Patient has prior history of Echocardiogram examinations, most                  recent 10/24/2023. Anxiety.  Sonographer:     Cristela Blue Referring Phys:  [id number] SHERI HAMMOCK Diagnosing Phys: Julien Nordmann MD PROCEDURE: After discussion of the risks and benefits of a TEE, an informed consent was obtained from the patient. TEE procedure time was 30 minutes. The transesophogeal probe was passed without difficulty through the esophogus of the patient. Local oropharyngeal anesthetic was provided with Cetacaine. Sedation performed by different physician. Image quality was excellent. The patient's vital signs; including heart rate, blood pressure, and oxygen saturation; remained stable throughout the procedure. The patient developed no complications during the procedure.  IMPRESSIONS  1. Left ventricular ejection fraction, by estimation, is 55 to 60%. The left ventricle has normal function. The left ventricle has no regional wall motion abnormalities.  2. Right ventricular systolic function is normal. The right ventricular size is normal.  3. No left atrial/left atrial appendage thrombus was detected.  4. The mitral valve is normal in structure. No evidence of mitral valve regurgitation. No evidence of mitral stenosis.  5. The aortic valve is normal in structure. Aortic valve regurgitation is not visualized. No aortic stenosis is present.  6. The inferior vena cava is normal in size with greater than 50% respiratory variability, suggesting right atrial pressure of 3 mmHg.  7. Agitated saline contrast bubble study was negative, with no evidence of any interatrial shunt.  8. No valve vegetation Conclusion(s)/Recommendation(s): Normal biventricular function without evidence of hemodynamically significant valvular heart disease. FINDINGS  Left Ventricle: Left ventricular ejection  fraction, by estimation, is 55 to 60%. The left ventricle has normal function. The left ventricle has no  regional wall motion abnormalities. The left ventricular internal cavity size was normal in size. There is  no left ventricular hypertrophy. Right Ventricle: The right ventricular size is normal. No increase in right ventricular wall thickness. Right ventricular systolic function is normal. Left Atrium: Left atrial size was normal in size. No left atrial/left atrial appendage thrombus was detected. Right Atrium: Right atrial size was normal in size. Pericardium: There is no evidence of pericardial effusion. Mitral Valve: The mitral valve is normal in structure. No evidence of mitral valve regurgitation. No evidence of mitral valve stenosis. There is no evidence of mitral valve vegetation. Tricuspid Valve: The tricuspid valve is normal in structure. Tricuspid valve regurgitation is not demonstrated. No evidence of tricuspid stenosis. There is no evidence of tricuspid valve vegetation. Aortic Valve: The aortic valve is normal in structure. Aortic valve regurgitation is not visualized. No aortic stenosis is present. There is no evidence of aortic valve vegetation. Pulmonic Valve: The pulmonic valve was normal in structure. Pulmonic valve regurgitation is not visualized. No evidence of pulmonic stenosis. There is no evidence of pulmonic valve vegetation. Aorta: The aortic root is normal in size and structure. Venous: The inferior vena cava is normal in size with greater than 50% respiratory variability, suggesting right atrial pressure of 3 mmHg. IAS/Shunts: No atrial level shunt detected by color flow Doppler. Agitated saline contrast was given intravenously to evaluate for intracardiac shunting. Agitated saline contrast bubble study was negative, with no evidence of any interatrial shunt. There  is no evidence of a patent foramen ovale. There is no evidence of an atrial septal defect. Julien Nordmann MD Electronically signed by Julien Nordmann MD Signature Date/Time: 10/29/2023/6:40:31 PM    Final    CT ABDOMEN W  CONTRAST Result Date: 10/28/2023 CLINICAL DATA:  Necrotizing pancreatitis EXAM: CT ABDOMEN WITH CONTRAST TECHNIQUE: Multidetector CT imaging of the abdomen was performed using the standard protocol following bolus administration of intravenous contrast. RADIATION DOSE REDUCTION: This exam was performed according to the departmental dose-optimization program which includes automated exposure control, adjustment of the mA and/or kV according to patient size and/or use of iterative reconstruction technique. CONTRAST:  80mL OMNIPAQUE IOHEXOL 300 MG/ML  SOLN COMPARISON:  10/22/2023 FINDINGS: Lower chest: Small left and trace right pleural effusions with bilateral lower lobe atelectasis. Hepatobiliary: Liver is within normal limits. Gallbladder is unremarkable. No intrahepatic or extrahepatic dilatation. Pancreas: New 2.1 cm low-density lesion in the pancreatic head (series 2/image 30), suggesting a pseudocyst. Improving inflammatory changes along the pancreatic tail (series 2/image 25), without convincing necrosis. Inferior to the pancreatic tail is a developing well-defined fluid collection (series 2/image 28), suggesting a pseudocyst. Spleen: Within normal limits. Adrenals/Urinary Tract: Adrenal glands are within normal limits. Kidneys are within normal limits.  No hydronephrosis. Stomach/Bowel: Stomach is within normal limits. Visualized bowel is grossly unremarkable. Vascular/Lymphatic: No evidence of abdominal aortic aneurysm. No suspicious abdominal lymphadenopathy. Other: No abdominal ascites. Musculoskeletal: Grade 1 anterolisthesis of L4 on L5. IMPRESSION: Improving pancreatitis, without convincing necrosis, but with developing small pseudocysts as above. Small left and trace right pleural effusions with bilateral lower lobe atelectasis. Electronically Signed   By: Charline Bills M.D.   On: 10/28/2023 23:30     Assessment/Plan: Acute pancreatitis with possible evolving necrosis tail and body  repeat CT  yesterday ( 10/28/23) did not show any abscess in pancreas or necrosis- 2 small pseudocyst  MRSA bacteremia- started on 1/9  -- 4 days into hospital stay no blood culture done on admission so would not know it was POA or noscomial There are metastatic lesions  - thoracic spine infection, left great toe infection with Osteomyelitis - too soon for bacteremia to seed the spine in the hospital TEE negative    what was the original source?  Pt currently on daptomycin. Will need 6 weeks of IV antibiotic from the first negative culture Repeat Blood culture   Left great toe MRSA septic arthritis and local abscess-pathology of met head biopsy shows acute osteomyelitis- Underwent Resection of first metatarsal phalangeal including head of first metatarsal and base of proximal phalanx, left 2.  Insertion of antibiotic cement spacer, left foot     Anemia   Leucoytosis improving  ETOH use   Past h/o transverse myelitis in 2011    Discussed the management with patient.

## 2023-10-30 NOTE — Inpatient Diabetes Management (Signed)
Inpatient Diabetes Program Recommendations  AACE/ADA: New Consensus Statement on Inpatient Glycemic Control (2015)  Target Ranges:  Prepandial:   less than 140 mg/dL      Peak postprandial:   less than 180 mg/dL (1-2 hours)      Critically ill patients:  140 - 180 mg/dL   Lab Results  Component Value Date   GLUCAP 341 (H) 10/30/2023   HGBA1C 6.3 (H) 10/18/2023    Review of Glycemic Control  Latest Reference Range & Units 10/28/23 15:56 10/28/23 21:18 10/29/23 07:14 10/29/23 11:18 10/29/23 15:29 10/29/23 21:11 10/30/23 06:53 10/30/23 11:54  Glucose-Capillary 70 - 99 mg/dL 629 (H) 528 (H) 413 (H) 214 (H) 259 (H) 178 (H) 190 (H) 341 (H)    No Hx of Diabetes--Current A1c= 6.3%   Current Orders: Novolog 0-9 units TID   Inpatient Diabetes Program Recommendations:    Consider adding low dose basal insulin while in the hospital- Semglee 8 units daily (0.15 units/kg).   Thanks,  Lorenza Cambridge, RN, BC-ADM Inpatient Diabetes Coordinator Pager [phone]  (8a-5p)

## 2023-10-30 NOTE — Progress Notes (Signed)
History and Physical Interval Note:  10/30/2023 7:13 AM  Danielle Harrison  has presented today for surgery, with the diagnosis of osteomyelitis on bone biopsy 1st met head s/p prior I&D of abscess about the 1st MPJ.  The various methods of treatment have been discussed with the patient and family. After consideration of risks, benefits and other options for treatment, the patient has consented to   Procedure(s): INCISION AND DRAINAGE LEFT FOOT WITH POSSIBLE DELAYED CLOSURE (Left) POSSIBLE LEFT FOOT 1ST MPJ RESECTION (Left) as a surgical intervention.  The patient's history has been reviewed, patient examined, no change in status, stable for surgery.  I have reviewed the patient's chart and labs.  Questions were answered to the patient's satisfaction.     [name] Denys Salinger

## 2023-10-30 NOTE — Brief Op Note (Signed)
10/30/2023  8:50 AM  PATIENT:  Danielle Harrison  48 y.o. female  PRE-OPERATIVE DIAGNOSIS:  LEFT FOOT SEPTIC ARTHRITIS  POST-OPERATIVE DIAGNOSIS:  LEFT FOOT SEPTIC ARTHRITIS  PROCEDURE:  Procedure(s): LEFT FOOT FIRST METATARSAL PHALANGEAL JOINT RESECTION, ANTIBIOTIC SPACER, WASHOUT, WOUND CLOSURE (Left) LEFT FOOT 1ST MPJ RESECTION (Left)  SURGEON:  Surgeons and Role:    * Tasharra Nodine, [name], DPM - Primary  PHYSICIAN ASSISTANT:   ASSISTANTS: none   ANESTHESIA:   IV sedation  EBL:  Minimal  BLOOD ADMINISTERED:none  DRAINS: none   LOCAL MEDICATIONS USED:  MARCAINE    and LIDOCAINE   SPECIMEN:  Source of Specimen:  1st met head and base of proximal phalanx  DISPOSITION OF SPECIMEN:   Path and Micro  COUNTS:  YES  TOURNIQUET:   Total Tourniquet Time Documented: area (laterality) - 31 minutes Total: area (laterality) - 31 minutes   DICTATION: .Note written in EPIC  PLAN OF CARE: Admit to inpatient   PATIENT DISPOSITION:  PACU - hemodynamically stable.   Delay start of Pharmacological VTE agent (>24hrs) due to surgical blood loss or risk of bleeding: no

## 2023-10-31 ENCOUNTER — Encounter: Payer: Self-pay | Admitting: Podiatry

## 2023-10-31 DIAGNOSIS — B9562 Methicillin resistant Staphylococcus aureus infection as the cause of diseases classified elsewhere: Secondary | ICD-10-CM | POA: Diagnosis not present

## 2023-10-31 DIAGNOSIS — K859 Acute pancreatitis without necrosis or infection, unspecified: Secondary | ICD-10-CM | POA: Diagnosis not present

## 2023-10-31 DIAGNOSIS — R7881 Bacteremia: Secondary | ICD-10-CM | POA: Diagnosis not present

## 2023-10-31 DIAGNOSIS — M00072 Staphylococcal arthritis, left ankle and foot: Secondary | ICD-10-CM | POA: Diagnosis not present

## 2023-10-31 DIAGNOSIS — M4624 Osteomyelitis of vertebra, thoracic region: Secondary | ICD-10-CM | POA: Diagnosis not present

## 2023-10-31 LAB — GLUCOSE, CAPILLARY
Glucose-Capillary: 218 mg/dL — ABNORMAL HIGH (ref 70–99)
Glucose-Capillary: 305 mg/dL — ABNORMAL HIGH (ref 70–99)
Glucose-Capillary: 205 mg/dL — ABNORMAL HIGH (ref 70–99)
Glucose-Capillary: 299 mg/dL — ABNORMAL HIGH (ref 70–99)

## 2023-10-31 LAB — BASIC METABOLIC PANEL
Anion gap: 7 (ref 5–15)
BUN: 10 mg/dL (ref 6–20)
CO2: 24 mmol/L (ref 22–32)
Calcium: 8.7 mg/dL — ABNORMAL LOW (ref 8.9–10.3)
Chloride: 99 mmol/L (ref 98–111)
Creatinine, Ser: 0.45 mg/dL (ref 0.44–1.00)
GFR, Estimated: >60 mL/min (ref >60)
Glucose, Bld: 287 mg/dL — ABNORMAL HIGH (ref 70–99)
Potassium: 4.4 mmol/L (ref 3.5–5.1)
Sodium: 130 mmol/L — ABNORMAL LOW (ref 135–145)

## 2023-10-31 LAB — CBC
HCT: 29.2 % — ABNORMAL LOW (ref 36.0–46.0)
Hemoglobin: 9.6 g/dL — ABNORMAL LOW (ref 12.0–15.0)
MCH: 32.9 pg (ref 26.0–34.0)
MCHC: 32.9 g/dL (ref 30.0–36.0)
MCV: 100 fL (ref 80.0–100.0)
Platelets: 668 10*3/uL — ABNORMAL HIGH (ref 150–400)
RBC: 2.92 MIL/uL — ABNORMAL LOW (ref 3.87–5.11)
RDW: 13.6 % (ref 11.5–15.5)
WBC: 10.4 10*3/uL (ref 4.0–10.5)
nRBC: 0 % (ref 0.0–0.2)

## 2023-10-31 LAB — PHOSPHORUS: Phosphorus: 2.9 mg/dL (ref 2.5–4.6)

## 2023-10-31 LAB — MAGNESIUM: Magnesium: 2 mg/dL (ref 1.7–2.4)

## 2023-10-31 MED ORDER — SODIUM CHLORIDE 1 G PO TABS
1.0000 g | ORAL_TABLET | Freq: Three times a day (TID) | ORAL | Status: AC
  Administered 2023-10-31 – 2023-11-03 (×9): 1 g via ORAL
  Filled 2023-10-31 (×9): qty 1

## 2023-10-31 NOTE — Progress Notes (Signed)
Physical Therapy Evaluation Patient Details Name: Danielle Harrison MRN: [id number] DOB: 1975/11/16 Today's Date: 10/31/2023  History of Present Illness  Billi L. Wee is a 48 year old female with ongoing alcohol use, repeated history of alcoholic pancreatitis, asthma, HTN was admitted last week with recurrent alcoholic pancreatitis. Patient was found to have alcoholic necrotic pancreatitis, MRSA bacteremia most likely due to osteomyelitis of T6-T9, left foot inflammation and left great toe septic arthritis. Patient is s/p L first metatarsal phalangeal joint resection, antibiotic space, and washout.   Clinical Impression  Patient received supine in bed upon arrival. Agreeable to PT evaluation, despite 10/10 pain reported in abdomen/low back, and L foot. Patient MOD I with bed mobility, increased time required due to pain. Patient able to complete STS from EOB with RW and CGA, and ambulate with RW x 10 ft with CGA. Patient able to consistently maintain NWB precautions this date. Increased fatigue/pain with mobility limiting tolerance this date. Patient left in bed with all needs in reach. Patient will benefit from skilled acute PT services to address functional impairments (see below for additional) and maximize functional mobility. Anticipate the need for follow up PT services upon acute hospital discharge. Will continue to follow acutely.       If plan is discharge home, recommend the following: A little help with walking and/or transfers;Assist for transportation;Help with stairs or ramp for entrance   Can travel by private vehicle        Equipment Recommendations Rolling walker (2 wheels);Other (comment) (Tub Bench)  Recommendations for Other Services       Functional Status Assessment Patient has had a recent decline in their functional status and demonstrates the ability to make significant improvements in function in a reasonable and predictable amount of time.     Precautions /  Restrictions Precautions Precautions: None Restrictions Weight Bearing Restrictions Per Provider Order: Yes LLE Weight Bearing Per Provider Order: Non weight bearing      Mobility  Bed Mobility Overal bed mobility: Modified Independent             General bed mobility comments: increased time required for supine > sit, increased time required due to pain    Transfers Overall transfer level: Needs assistance Equipment used: Rolling walker (2 wheels) Transfers: Sit to/from Stand Sit to Stand: Contact guard assist           General transfer comment: able to complete sit > stand with RW from EOB, ability to maintain NWB precautions on LLE    Ambulation/Gait Ambulation/Gait assistance: Contact guard assist Gait Distance (Feet): 10 Feet Assistive device: Rolling walker (2 wheels)   Gait velocity: Decreased     General Gait Details: Gait with RW, able to complete hop to pattern, maintain NWB precautions on LLE, able to complete x 10 ft, mild fatigue only standing rest break required.  Stairs Stairs:  (not completed due to pain/fatigue)          Wheelchair Mobility     Tilt Bed    Modified Rankin (Stroke Patients Only)       Balance Overall balance assessment: Needs assistance Sitting-balance support: Feet supported, No upper extremity supported Sitting balance-Leahy Scale: Normal     Standing balance support: Bilateral upper extremity supported, During functional activity, Reliant on assistive device for balance Standing balance-Leahy Scale: Fair Standing balance comment: increased reliance on RW  Pertinent Vitals/Pain Pain Assessment Pain Assessment: 0-10 Pain Score: 10-Worst pain ever Pain Location: L Foot, Abdomen/Back Pain Descriptors / Indicators: Aching, Tender Pain Intervention(s): Limited activity within patient's tolerance, Monitored during session    Home Living Family/patient expects to be  discharged to:: Private residence Living Arrangements: Other (Comment) (Friend) Available Help at Discharge: Friend(s) Type of Home: House Home Access: Stairs to enter Entrance Stairs-Rails: Right;Left (pt unsure if can reach both; will clarify via friend) Secretary/administrator of Steps: 3   Home Layout: One level        Prior Function Prior Level of Function : Independent/Modified Independent             Mobility Comments: IND, no AD use. ADLs Comments: IND     Extremity/Trunk Assessment                Communication   Communication Communication: No apparent difficulties  Cognition Arousal: Alert Behavior During Therapy: WFL for tasks assessed/performed Overall Cognitive Status: Within Functional Limits for tasks assessed                                          General Comments      Exercises     Assessment/Plan    PT Assessment    PT Problem List         PT Treatment Interventions      PT Goals (Current goals can be found in the Care Plan section)  Acute Rehab PT Goals Patient Stated Goal: Get Back Home PT Goal Formulation: With patient Time For Goal Achievement: 11/14/23 Potential to Achieve Goals: Good    Frequency Min 1X/week     Co-evaluation               AM-PAC PT "6 Clicks" Mobility  Outcome Measure Help needed turning from your back to your side while in a flat bed without using bedrails?: None Help needed moving from lying on your back to sitting on the side of a flat bed without using bedrails?: None Help needed moving to and from a bed to a chair (including a wheelchair)?: A Little Help needed standing up from a chair using your arms (e.g., wheelchair or bedside chair)?: A Little Help needed to walk in hospital room?: A Little Help needed climbing 3-5 steps with a railing? : A Lot 6 Click Score: 19    End of Session Equipment Utilized During Treatment: Gait belt Activity Tolerance: Patient tolerated  treatment well Patient left: in bed;with call bell/phone within reach Nurse Communication: Mobility status PT Visit Diagnosis: Unsteadiness on feet (R26.81);Pain;Other abnormalities of gait and mobility (R26.89);Difficulty in walking, not elsewhere classified (R26.2) Pain - Right/Left: Left Pain - part of body: Leg    Time: 0865-7846 PT Time Calculation (min) (ACUTE ONLY): 19 min   Charges:   PT Evaluation $PT Eval Low Complexity: 1 Low   PT General Charges $$ ACUTE PT VISIT: 1 Visit        Creed Copper Fairly, PT, DPT 10/31/23 2:25 PM

## 2023-10-31 NOTE — Progress Notes (Signed)
Date of Admission:  10/18/2023   T  ID: Danielle Harrison is a 48 y.o. 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)   Abscess of left foot   MRSA bacteremia   Acute osteomyelitis of thoracic spine (HCC)   Acute bacterial endocarditis   Acute osteomyelitis of left foot (HCC)    Subjective: Doing better Still not eating solid food as it makes her stomach full and painful Back pain is better but still mod-severe  Medications:   vitamin C  500 mg Oral BID   bisacodyl  10 mg Oral QHS   Chlorhexidine Gluconate Cloth  6 each Topical Q0600   enoxaparin (LOVENOX) injection  40 mg Subcutaneous Q24H   feeding supplement  237 mL Oral BID BM   folic acid  1 mg Oral Daily   guaiFENesin  600 mg Oral BID   insulin aspart  0-9 Units Subcutaneous TID WC   insulin glargine-yfgn  8 Units Subcutaneous Daily   metoprolol tartrate  50 mg Oral BID   multivitamin with minerals  1 tablet Oral Daily   mupirocin ointment  1 Application Nasal BID   polyethylene glycol  17 g Oral BID   thiamine  100 mg Oral Daily   Or   thiamine  100 mg Intravenous Daily    Objective: Vital signs in last 24 hours: Patient Vitals for the past 24 hrs:  BP Temp Temp src Pulse Resp SpO2 Weight  10/31/23 0856 (!) 133/95 97.8 F (36.6 C) Oral (!) 106 16 94 % --  10/31/23 0447 -- -- -- -- -- -- 57.7 kg  10/31/23 0427 (!) 134/90 97.9 F (36.6 C) Oral 97 16 98 % --  10/30/23 2009 121/84 -- -- (!) 108 -- 99 % --  10/30/23 2008 (!) 146/113 98.2 F (36.8 C) Oral (!) 110 -- 100 % --  10/30/23 1549 104/70 99.2 F (37.3 C) Oral (!) 102 16 99 % --      PHYSICAL EXAM:  General: Awake.  Alert , no distress Lungs: Clear to auscultation bilaterally. No Wheezing or Rhonchi. No rales. Heart: Regular rate and rhythm, no murmur, rub or gallop. Abdomen: fullness in  the epigastrium Extremities: left foot dressing not removed Wound vac in place  Lab Results    Latest Ref Rng & Units 10/31/2023    5:47 AM 10/30/2023    5:43 AM 10/29/2023    4:16 AM  CBC  WBC 4.0 - 10.5 K/uL 10.4  14.2  14.9   Hemoglobin 12.0 - 15.0 g/dL 9.6  96.2  95.2   Hematocrit 36.0 - 46.0 % 29.2  32.2  30.7   Platelets 150 - 400 K/uL 668  761  659        Latest Ref Rng & Units 10/31/2023    5:47 AM 10/30/2023    5:43 AM 10/29/2023    4:16 AM  CMP  Glucose 70 - 99 mg/dL 841  324  401   BUN 6 - 20 mg/dL 10  8  7    Creatinine 0.44 - 1.00 mg/dL 0.27  2.53  6.64   Sodium 135 - 145 mmol/L 130  132  132   Potassium 3.5 - 5.1 mmol/L 4.4  3.8  4.0   Chloride 98 - 111 mmol/L 99  97  95   CO2 22 -  32 mmol/L 24  25  25    Calcium 8.9 - 10.3 mg/dL 8.7  8.8  8.5       Microbiology: [facility] 10/23/23 MRSA bacteremia 4/4 10/25/23 MRSA bacteremia 4/4 1/12 BC- MRSA 1/13 toe cultures - NG X 3 1/16 BC-  Studies/Results: DG Foot 2 Views Left Result Date: 10/30/2023 CLINICAL DATA:  Postoperative. EXAM: LEFT FOOT - 2 VIEW COMPARISON:  Left foot radiograph dated 10/24/2023. FINDINGS: Status post resection and cement of the first MTP joint. No acute fracture or dislocation. There is soft tissue swelling of the forefoot. Overlying dressing noted. IMPRESSION: Postsurgical changes of the first MTP joint. Electronically Signed   By: Elgie Collard M.D.   On: 10/30/2023 13:00     Assessment/Plan: Acute pancreatitis with possible evolving necrosis tail and body  repeat CT yesterday ( 10/28/23) did not show any abscess in pancreas or necrosis- 2 small pseudocyst     MRSA bacteremia- started on 1/9  -- 4 days into hospital stay no blood culture done on admission so would never know it was POA or noscomial There are metastatic lesions  - thoracic spine infection, left great toe infection with Osteomyelitis - surprising  that seeding happened quickly i in the hospital TEE negative  there was a question  whether the pancreas was infected with MRSA- no obviosu abscess   Pt currently on daptomycin. Will need 6 weeks of IV antibiotic from the first negative culture Repeat Blood culture sent on 1/16  If it remains negative on 11/02/23  can then have PICC line  She will need to be taught to do home OPAT     Left great toe MRSA septic arthritis and local abscess-pathology of met head biopsy shows acute osteomyelitis- Underwent Resection of first metatarsal phalangeal including head of first metatarsal and base of proximal phalanx, left      Anemia   Leucoytosis fluctuates- could be high post surgery  ETOH use   Past h/o transverse myelitis in 2011    Discussed the management with patient. ID will not see her this weekend- On call ID available by phone for urgent issues

## 2023-10-31 NOTE — TOC Progression Note (Signed)
Transition of Care [facility]) - Progression Note    Patient Details  Name: Danielle Harrison MRN: [id number] Date of Birth: Jun 11, 1976  Transition of Care [facility]) CM/SW Contact  Chapman Fitch, RN Phone Number: 10/31/2023, 3:03 PM  Clinical Narrative:     Therapy recommending home health RW and tub bench Patient states she doesn't have a preference for agency Referral made to [facility] with Frances Furbish for PT Referral has already been made to [facility] with Julianne Rice for RN and infusion Per MD anticipated dc not till next week  Expected Discharge Plan: Home w Home Health Services    Expected Discharge Plan and Services                                               Social Determinants of Health (SDOH) Interventions SDOH Screenings   Food Insecurity: No Food Insecurity (10/21/2023)  Housing: High Risk (10/21/2023)  Transportation Needs: No Transportation Needs (10/21/2023)  Utilities: Not At Risk (10/21/2023)  Tobacco Use: Medium Risk (10/30/2023)    Readmission Risk Interventions     No data to display

## 2023-10-31 NOTE — Progress Notes (Signed)
PROGRESS NOTE    Danielle Harrison  NFA:[id number] DOB: 1976-06-12 DOA: 10/18/2023 PCP: [facility]  201A/201A-AA  LOS: 13 days   Brief hospital course: Danielle Harrison is a 48 y.o. female with medical history Anxiety, Asthma, HLD, HTN restless leg syndrome, etoh abuse, hx of etoh pancreatitis who presents to ED with n/v/ severe abdominal pain x 48 hours.  She notes  pain mid to lower abdomen and radiates towards her back. She note no fever /chills/ chest pain or sob. Patient also noted normal bowel movements, no diarrhea or constipation. Notes n/v improved s/p treatment in ED.  Patient was found to have alcoholic necrotic pancreatitis, MRSA bacteremia most likely due to osteomyelitis of T6-T9, left foot inflammation and left great toe septic arthritis.  ID consulted, patient was on vancomycin and Zosyn, which was switched to cefepime and Flagyl.    Assessment & Plan:  # Acute ETOH pancreatitis with developing necrosis --continued to have abdominal pain.  Started having fevers.  Repeat CT a/p showed finding concerning for developing necrosis.  No organized fluid collection. --abd pain improving and is tolerating full liquids -General Surgery following recommended transfer to tertiary care center.  [facility] and [facility] declined due to capacity.  General surgery signed off. On 1/12 case was discussed with Dr. Cliffton Asters at [facility], recommended conservative management and antibiotics, no need of surgical intervention at this time unless there is a large infected fluid collection. - s/p vanc, and Zosyn.  1/12 started cefepime and Flagyl d/c'd on 1/15 1/13 stared Dapto as per ID As per ID patient will need 6 weeks of antibiotics 1/14 tolerating full liquid diet, advance to soft diet, advised to start with small meals and advance as per tolerance. 1/14 CT A/P: Improving pancreatitis, without convincing necrosis, but with developing small pseudocysts as above. Small left and trace right pleural  effusions with bilateral lower lobe atelectasis.   # Sepsis due to MRSA bacteremia Hemodynamically stable. Patient denies hardware, hx ivdu, new piv yesterday S/p vanc and Zosyn, from 1/12 -1/15  cefepime and Flagyl. 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 Follow fluid culture ID following 1/16 s/p 1. Resection of first metatarsal phalangeal joint including head of first metatarsal and base of proximal phalanx, left. 2.  Insertion of antibiotic cement spacer, left foot. 3.  Delayed primary closure of the surgical wound, 5 x 1 cm, left foot 1/17 patient was cleared by podiatry for discharge planning and follow-up as an outpatient.  # Focal small bowel intussusception in the left mid-abdomen  --Per [place]urg, likely incidental, No clinical evidence of bowel obstruction or ischemia. No surgical intervention is needed from that standpoint either.  --repeat CT a/p showed the same. Plan: --Short interval follow up CT is recommended to ensure resolution and exclude a neoplastic lead point. 1/14 repeat CT a/p did not show any  abnormality in the bowels.  Acute metabolic acidosis and Lactic acidosis: resolved after IVF   Hyponatremia, presumed due to pancreatitis --s/p ivf 1/15 started sodium chloride tablets 1 g p.o. 3 times daily for 5  days  Hypokalemia, potassium repleted Hypophosphatemia, Phos repleted Monitor electrolytes and replete as needed  Hyperglycemia, Prediabetes, A1c 6.3   Hx of HTN 1/13 metoprolol 50 mg p.o. twice daily was started 1/16 BP soft possible due to anesthesia, continue to monitor.   HLD not taking medications due to loss of insurance Follow outpatient   ETOH  --no signs of withdrawal   Reactive ileus and constipation --resolved with aggressive Miralax  Thrombocytosis most likely reactive due to infection Monitor platelet count daily   DVT prophylaxis: Lovenox SQ Code Status: Full code  Family Communication:  Level of care: Med-Surg Dispo:   The patient is from: home Anticipated d/c is to: home Anticipated d/c date is: to be determined 1/11 declined by [facility] due to capacity  1/12 declined by [facility] and [facility] due to capacity.   Subjective and Interval History:  No significant events overnight, patient had pain in the left foot 7/10 complaining of more pain in the back and abdomen all over 8/10. Denies any chest pain or palpitation, no shortness of breath.   Objective: Vitals:   10/30/23 2009 10/31/23 0427 10/31/23 0447 10/31/23 0856  BP: 121/84 (!) 134/90  (!) 133/95  Pulse: (!) 108 97  (!) 106  Resp:  16  16  Temp:  97.9 F (36.6 C)  97.8 F (36.6 C)  TempSrc:  Oral  Oral  SpO2: 99% 98%  94%  Weight:   57.7 kg   Height:        Intake/Output Summary (Last 24 hours) at 10/31/2023 1525 Last data filed at 10/31/2023 1100 Gross per 24 hour  Intake 360 ml  Output --  Net 360 ml   Filed Weights   10/29/23 0319 10/30/23 0411 10/31/23 0447  Weight: 56.8 kg 57.4 kg 57.7 kg    Examination:   Constitutional: NAD, AAOx3 HEENT: conjunctivae and lids normal, EOMI CV: No cyanosis.  Tachycardic, rr RESP: normal respiratory effort, on RA Neuro: II - XII grossly intact.   Msk: no spinous process tencerness. Mild swelling left first mtp LE: s/p left foot sx, dressing  CDI  Data Reviewed: I have personally reviewed labs and imaging studies  CRITICAL CARE Performed by: Gillis Santa   Total critical care time: 40 minutes  Critical care time was exclusive of separately billable procedures and treating other patients.  Critical care was necessary to treat or prevent imminent or life-threatening deterioration.  Critical care was time spent personally by me on the following activities: development of treatment plan with patient and/or surrogate as well as nursing, discussions with consultants, evaluation of patient's response to treatment, examination of patient, obtaining history from patient or surrogate, ordering and performing treatments and interventions, ordering and review of laboratory studies, ordering and review of radiographic studies, pulse oximetry and re-evaluation of patient's condition.    Gillis Santa, MD [facility] If 7PM-7AM, please contact night-coverage 10/31/2023, 3:25 PM

## 2023-10-31 NOTE — Plan of Care (Signed)

## 2023-10-31 NOTE — Progress Notes (Signed)
  Subjective:  Patient ID: Danielle Harrison, female    DOB: [birth year],  MRN: [id number]  Chief Complaint  Patient presents with   Abdominal Pain    DOS: 10/27/2023 and 10/30/2023 Procedure: 1.  Resection of first metatarsal phalangeal joint including head of first metatarsal and base of proximal phalanx, left 2.  Insertion of antibiotic cement spacer, left foot 3.  Delayed primary closure of the surgical wound, 5 x 1 cm, left foot  48 y.o. female seen for post op check.  Patient reports she has some pain approximately 4 out of 10 on the left foot.  She has a postop shoe in the room.  She has not yet worked with physical therapy.  Discussed the findings from surgery yesterday as well as plans going forward.  Patient was informed that eventually she Danielle likely require repeat surgery to manage the antibiotic spacer in her left foot though this Danielle happen in the future and not during this admission.  Review of Systems: Negative except as noted in the HPI. Denies N/V/F/Ch.   Objective:   Vitals:   10/31/23 0427 10/31/23 0856  BP: (!) 134/90 (!) 133/95  Pulse: 97 (!) 106  Resp: 16 16  Temp: 97.9 F (36.6 C) 97.8 F (36.6 C)  SpO2: 98% 94%   Body mass index is 24.84 kg/m. Constitutional Well developed. Well nourished.  Vascular Foot warm and well perfused. Capillary refill normal to all digits.   No calf pain with palpation  Neurologic Normal speech. Oriented to person, place, and time. Epicritic sensation diminished to left foot  Dermatologic Dressing clean dry and intact  Orthopedic: Status post left first MPJ resection with antibiotic spacer and delayed closure of wound.   Radiographs: Antibiotic spacer present in the first MPJ.  Pathology: Pending  Micro: Pending no growth so far  Assessment:   Septic arthritis first MPJ left foot status post repeat debridement with first MPJ resection and antibiotic spacer and delayed primary closure postop day 1  Plan:  Patient was  evaluated and treated and all questions answered.  POD # 1 s/p MPJ resection and delayed primary closure with antibiotic spacer implantation -Progressing as expected postop no acute complications.  Leukocytosis is improving -XR: Antibiotic cement spacer in the first MPJ at site of resected bone -WB Status: Weightbearing as tolerated in postop shoe -Sutures: To remain intact. -Medications/ABX: Antibiotics per infectious disease appreciate recommendations -Dressing left intact.  Danielle consider change over the weekend however if not changed okay for discharge and patient Danielle follow-up in 1 week for dressing change in the office either [place] in [place] on Thursday -Danielle follow intermittently and available for questions or concerns via epic chat        Corinna Gab, DPM [facility] / [facility]

## 2023-10-31 NOTE — TOC Initial Note (Signed)
Transition of Care [facility]) - Initial/Assessment Note    Patient Details  Name: Danielle Harrison MRN: [id number] Date of Birth: 03/18/1976  Transition of Care [facility]) CM/SW Contact:    Chapman Fitch, RN Phone Number: 10/31/2023, 10:31 AM  Clinical Narrative:                  Admitted AOZ:[id number] Admitted from: Home.  Currently staying with a Friend named Scot at [facility], [place], [place], [address] Current home health/prior home health/DME: NA  Patient has post op shoe at bedside.  Patient denies the need for RW or [facility] at discharge Patient will require 6 weeks of IV antibiotics at discharge.  Patient states she does not have a preference on infusion agency.  Referral made to [facility] with [facility], and they will utilize their nursing staff    Expected Discharge Plan: Home w Home Health Services     Patient Goals and CMS Choice            Expected Discharge Plan and Services                                              Prior Living Arrangements/Services                       Activities of Daily Living   ADL Screening (condition at time of admission) Independently performs ADLs?: Yes (appropriate for developmental age) Is the patient deaf or have difficulty hearing?: No Does the patient have difficulty seeing, even when wearing glasses/contacts?: No Does the patient have difficulty concentrating, remembering, or making decisions?: No  Permission Sought/Granted                  Emotional Assessment              Admission diagnosis:  Acute pancreatitis [K85.90] Pancreatitis [K85.90] Acute pancreatitis, unspecified complication status, unspecified pancreatitis type [K85.90] Patient Active Problem List   Diagnosis Date Noted   Acute osteomyelitis of left foot (HCC) 10/30/2023   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 [place] [place] [address] Phone: [phone] Fax: [phone]     Social Drivers of Health (SDOH) Social History: SDOH Screenings   Food Insecurity: No Food Insecurity (10/21/2023)  Housing: High Risk (10/21/2023)  Transportation Needs: No Transportation Needs (10/21/2023)  Utilities: Not At Risk (10/21/2023)  Tobacco Use: Medium Risk (10/30/2023)   SDOH Interventions:     Readmission Risk Interventions     No data to display

## 2023-11-01 DIAGNOSIS — K859 Acute pancreatitis without necrosis or infection, unspecified: Secondary | ICD-10-CM | POA: Diagnosis not present

## 2023-11-01 LAB — GLUCOSE, CAPILLARY
Glucose-Capillary: 222 mg/dL — ABNORMAL HIGH (ref 70–99)
Glucose-Capillary: 284 mg/dL — ABNORMAL HIGH (ref 70–99)
Glucose-Capillary: 169 mg/dL — ABNORMAL HIGH (ref 70–99)

## 2023-11-01 LAB — PHOSPHORUS: Phosphorus: 3.4 mg/dL (ref 2.5–4.6)

## 2023-11-01 LAB — CBC
HCT: 26.1 % — ABNORMAL LOW (ref 36.0–46.0)
Hemoglobin: 8.7 g/dL — ABNORMAL LOW (ref 12.0–15.0)
MCH: 32.6 pg (ref 26.0–34.0)
MCHC: 33.3 g/dL (ref 30.0–36.0)
MCV: 97.8 fL (ref 80.0–100.0)
Platelets: 697 10*3/uL — ABNORMAL HIGH (ref 150–400)
RBC: 2.67 MIL/uL — ABNORMAL LOW (ref 3.87–5.11)
RDW: 13.4 % (ref 11.5–15.5)
WBC: 10.6 10*3/uL — ABNORMAL HIGH (ref 4.0–10.5)
nRBC: 0 % (ref 0.0–0.2)

## 2023-11-01 LAB — AEROBIC/ANAEROBIC CULTURE W GRAM STAIN (SURGICAL/DEEP WOUND)
Culture: NO GROWTH
Culture: NO GROWTH
Culture: NO GROWTH
Gram Stain: NONE SEEN
Gram Stain: NONE SEEN

## 2023-11-01 LAB — MAGNESIUM: Magnesium: 2.1 mg/dL (ref 1.7–2.4)

## 2023-11-01 LAB — BASIC METABOLIC PANEL WITH GFR
Anion gap: 6 (ref 5–15)
BUN: 8 mg/dL (ref 6–20)
CO2: 24 mmol/L (ref 22–32)
Calcium: 8.7 mg/dL — ABNORMAL LOW (ref 8.9–10.3)
Chloride: 100 mmol/L (ref 98–111)
Creatinine, Ser: 0.42 mg/dL — ABNORMAL LOW (ref 0.44–1.00)
GFR, Estimated: >60 mL/min
Glucose, Bld: 260 mg/dL — ABNORMAL HIGH (ref 70–99)
Potassium: 4.7 mmol/L (ref 3.5–5.1)
Sodium: 130 mmol/L — ABNORMAL LOW (ref 135–145)

## 2023-11-01 NOTE — Progress Notes (Signed)
PROGRESS NOTE    Danielle Harrison  PIR:[id number] DOB: 1976/04/14 DOA: 10/18/2023 PCP: [facility]  201A/201A-AA  LOS: 14 days   Brief hospital course: Danielle Harrison is a 48 y.o. female with medical history Anxiety, Asthma, HLD, HTN restless leg syndrome, etoh abuse, hx of etoh pancreatitis who presents to ED with n/v/ severe abdominal pain x 48 hours.  She notes  pain mid to lower abdomen and radiates towards her back. She note no fever /chills/ chest pain or sob. Patient also noted normal bowel movements, no diarrhea or constipation. Notes n/v improved s/p treatment in ED.  Patient was found to have alcoholic necrotic pancreatitis, MRSA bacteremia most likely due to osteomyelitis of T6-T9, left foot inflammation and left great toe septic arthritis.  ID consulted, patient was on vancomycin and Zosyn, which was switched to cefepime and Flagyl.    Assessment & Plan:  # Acute ETOH pancreatitis with developing necrosis --continued to have abdominal pain.  Started having fevers.  Repeat CT a/p showed finding concerning for developing necrosis.  No organized fluid collection. --abd pain improving and is tolerating full liquids -General Surgery following recommended transfer to tertiary care center.  [facility] declined due to capacity.  General surgery signed off. On 1/12 case was discussed with Dr. Cliffton Asters at [facility], recommended conservative management and antibiotics, no need of surgical intervention at this time unless there is a large infected fluid collection. - s/p vanc, and Zosyn.  1/12 started cefepime and Flagyl d/c'd on 1/15 1/13 stared Dapto as per ID 1/14 tolerating full liquid diet, advance to soft diet, advised to start with small meals and advance as per tolerance. 1/14 CT A/P: Improving pancreatitis, without convincing necrosis, but with developing small pseudocysts as above. Small left and trace right pleural effusions with bilateral lower lobe atelectasis.   #  Sepsis due to MRSA bacteremia Hemodynamically stable. Patient denies hardware, hx ivdu, new piv yesterday S/p vanc and Zosyn, from 1/12 -1/15  cefepime and Flagyl. 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  Continue antibiotic for 6 weeks till 12/11/2023 PICC line on Sunday, 11/02/2023 if blood cultures remain negative as per ID  # Acute osteomyelitis of first MPJ  1/13 Bone, biopsy, Left first metatarsal head :  ACUTE OSTEOMYELITIS  Right MTP swelling MRI shows possible septic arthritis great toe and fluid collection in the foot possible abscess Continue cefepime and Flagyl Podiatry consulted, s/p right great toe washout and culture sent on 1/13. Follow fluid culture ID following 1/16 s/p 1. Resection of first metatarsal phalangeal joint including head of first metatarsal and base of proximal phalanx, left. 2.  Insertion of antibiotic cement spacer, left foot. 3.  Delayed primary closure of the surgical wound, 5 x 1 cm, left foot 1/17 patient was cleared by podiatry for discharge planning and follow-up as an outpatient.  # Focal small bowel intussusception in the left mid-abdomen, Resolved  Per [place]urg, likely incidental, No clinical evidence of bowel obstruction or ischemia. No surgical intervention is needed from that standpoint either.  repeat CT a/p showed the same. 1/14 repeat CT a/p did not show any abnormality in the bowels. # Reactive ileus and constipation --resolved with aggressive Miralax  # Acute metabolic acidosis and  Lactic acidosis: resolved after IVF   # Hyponatremia, presumed due to pancreatitis --s/p ivf 1/15 started sodium chloride tablets 1 g p.o. 3 times daily for 5 days  # Hypokalemia, potassium repleted # Hypophosphatemia, Phos repleted Monitor electrolytes and replete as needed  # Hyperglycemia, Prediabetes, A1c 6.3   # Hx of HTN 1/13 metoprolol 50 mg p.o. twice daily was started 1/16 BP soft possible due to anesthesia, continue to monitor.   # HLD not taking medications due to loss of insurance Follow outpatient   # ETOH  --no signs of withdrawal   # Thrombocytosis most likely reactive due to infection Monitor platelet count daily   DVT prophylaxis: Lovenox SQ Code Status: Full code  Family Communication:  Level of care: Med-Surg Dispo:   The patient is from: home Anticipated d/c is to: home Anticipated d/c date is: Most likely in 1 to 2 days after PICC line insertion.   Subjective and Interval History:  No significant events overnight, patient is moving bowels, still has abdominal and back pain 8/10 and left foot pain 7/10.  No any nausea vomiting, no chest pain or palpitation, no shortness of breath.   Objective: Vitals:   10/31/23 1931 11/01/23 0500 11/01/23 0505 11/01/23 0747  BP: (!) 140/89  129/88 (!) 145/91  Pulse: (!) 108  99 (!) 110  Resp: 20  20 16   Temp: 98.6 F (37 C)  98.5 F (36.9 C) 98.2 F (36.8 C)  TempSrc: Oral     SpO2: 100%  96% 100%  Weight:  54.4 kg    Height:       No intake or output data in the 24 hours ending 11/01/23 1432  Filed Weights   10/30/23 0411 10/31/23 0447 11/01/23 0500  Weight: 57.4 kg 57.7 kg 54.4 kg    Examination:   Constitutional: NAD, AAOx3 HEENT: conjunctivae and lids normal, EOMI CV: No cyanosis.  Tachycardic, rr RESP: normal respiratory effort, on RA Neuro: II - XII grossly intact.   Msk: no spinous process tencerness. Mild swelling left first mtp LE: s/p left foot sx, dressing CDI  Data Reviewed: I  have personally reviewed labs and imaging studies  CRITICAL CARE Performed by: Gillis Santa   Total critical care time: 40 minutes  Critical care time was exclusive of separately billable procedures and treating other patients.  Critical care was necessary to treat or prevent imminent or life-threatening deterioration.  Critical care was time spent personally by me on the following activities: development of treatment plan with patient and/or surrogate as well as nursing, discussions with consultants, evaluation of patient's response to treatment, examination of patient, obtaining history from patient or surrogate, ordering and performing treatments and interventions, ordering and review of laboratory studies, ordering and review of radiographic studies, pulse oximetry and re-evaluation of patient's condition.    Gillis Santa, MD [facility] If 7PM-7AM, please contact night-coverage 11/01/2023, 2:32 PM

## 2023-11-01 NOTE — Plan of Care (Signed)

## 2023-11-02 ENCOUNTER — Inpatient Hospital Stay: Payer: Medicaid Other

## 2023-11-02 ENCOUNTER — Other Ambulatory Visit: Payer: Self-pay

## 2023-11-02 DIAGNOSIS — K859 Acute pancreatitis without necrosis or infection, unspecified: Secondary | ICD-10-CM | POA: Diagnosis not present

## 2023-11-02 LAB — CBC
HCT: 27.3 % — ABNORMAL LOW (ref 36.0–46.0)
Hemoglobin: 9.2 g/dL — ABNORMAL LOW (ref 12.0–15.0)
MCH: 33.1 pg (ref 26.0–34.0)
MCHC: 33.7 g/dL (ref 30.0–36.0)
MCV: 98.2 fL (ref 80.0–100.0)
Platelets: 739 10*3/uL — ABNORMAL HIGH (ref 150–400)
RBC: 2.78 MIL/uL — ABNORMAL LOW (ref 3.87–5.11)
RDW: 13.2 % (ref 11.5–15.5)
WBC: 10 10*3/uL (ref 4.0–10.5)
nRBC: 0 % (ref 0.0–0.2)

## 2023-11-02 LAB — GLUCOSE, CAPILLARY
Glucose-Capillary: 213 mg/dL — ABNORMAL HIGH (ref 70–99)
Glucose-Capillary: 349 mg/dL — ABNORMAL HIGH (ref 70–99)
Glucose-Capillary: 125 mg/dL — ABNORMAL HIGH (ref 70–99)
Glucose-Capillary: 101 mg/dL — ABNORMAL HIGH (ref 70–99)

## 2023-11-02 LAB — PHOSPHORUS: Phosphorus: 4.3 mg/dL (ref 2.5–4.6)

## 2023-11-02 LAB — BASIC METABOLIC PANEL
Anion gap: 9 (ref 5–15)
BUN: 12 mg/dL (ref 6–20)
CO2: 21 mmol/L — ABNORMAL LOW (ref 22–32)
Calcium: 8.9 mg/dL (ref 8.9–10.3)
Chloride: 99 mmol/L (ref 98–111)
Creatinine, Ser: 0.5 mg/dL (ref 0.44–1.00)
GFR, Estimated: >60 mL/min (ref >60)
Glucose, Bld: 181 mg/dL — ABNORMAL HIGH (ref 70–99)
Potassium: 4.6 mmol/L (ref 3.5–5.1)
Sodium: 129 mmol/L — ABNORMAL LOW (ref 135–145)

## 2023-11-02 LAB — MAGNESIUM: Magnesium: 2.1 mg/dL (ref 1.7–2.4)

## 2023-11-02 MED ORDER — GUAIFENESIN-DM 100-10 MG/5ML PO SYRP: 10.0000 mL | ORAL_SOLUTION | Freq: Four times a day (QID) | ORAL | Status: DC | PRN

## 2023-11-02 MED ORDER — METOPROLOL TARTRATE 50 MG PO TABS
75.0000 mg | ORAL_TABLET | Freq: Two times a day (BID) | ORAL | Status: DC
  Administered 2023-11-02 – 2023-11-03 (×3): 75 mg via ORAL
  Filled 2023-11-02 (×3): qty 1

## 2023-11-02 MED ORDER — SODIUM CHLORIDE 0.9% FLUSH: 10.0000 mL | INTRAVENOUS | Status: DC | PRN

## 2023-11-02 MED ORDER — SODIUM CHLORIDE 0.9% FLUSH
10.0000 mL | Freq: Two times a day (BID) | INTRAVENOUS | Status: DC
  Administered 2023-11-02 – 2023-11-04 (×4): 10 mL

## 2023-11-02 NOTE — Progress Notes (Signed)
   11/02/23 0753  Assess: MEWS Score  Temp 98.1 F (36.7 C)  BP (!) 153/97  MAP (mmHg) 114  Pulse Rate (!) 113  Resp 16  SpO2 100 %  O2 Device Room Air  Assess: MEWS Score  MEWS Temp 0  MEWS Systolic 0  MEWS Pulse 2  MEWS RR 0  MEWS LOC 0  MEWS Score 2  MEWS Score Color Yellow  Assess: if the MEWS score is Yellow or Red  Were vital signs accurate and taken at a resting state? Yes  Does the patient meet 2 or more of the SIRS criteria? No  Does the patient have a confirmed or suspected source of infection? Yes  MEWS guidelines implemented  Yes, yellow  Treat  MEWS Interventions Considered administering scheduled or prn medications/treatments as ordered  Take Vital Signs  Increase Vital Sign Frequency  Yellow: Q2hr x1, continue Q4hrs until patient remains green for 12hrs  Escalate  MEWS: Escalate Yellow: Discuss with charge nurse and consider notifying provider and/or RRT  Notify: Charge Nurse/RN  Name of Charge Nurse/RN Notified Alex  Provider Notification  Provider Name/Title Lucianne Muss  Date Provider Notified 11/02/23  Time Provider Notified [phone]  Notification Reason Change in status  Provider response Evaluate remotely  Date of Provider Response 11/02/23  Time of Provider Response 0811  Assess: SIRS CRITERIA  SIRS Temperature  0  SIRS Respirations  0  SIRS Pulse 1  SIRS WBC 0  SIRS Score Sum  1

## 2023-11-02 NOTE — Progress Notes (Signed)
Peripherally Inserted Central Catheter Placement  The IV Nurse has discussed with the patient and/or persons authorized to consent for the patient, the purpose of this procedure and the potential benefits and risks involved with this procedure.  The benefits include less needle sticks, lab draws from the catheter, and the patient may be discharged home with the catheter. Risks include, but not limited to, infection, bleeding, blood clot (thrombus formation), and puncture of an artery; nerve damage and irregular heartbeat and possibility to perform a PICC exchange if needed/ordered by physician.  Alternatives to this procedure were also discussed.  Bard Power PICC patient education guide, fact sheet on infection prevention and patient information card has been provided to patient /or left at bedside.    PICC Placement Documentation  PICC Single Lumen 11/02/23 Right Basilic 35 cm 1 cm (Active)  Indication for Insertion or Continuance of Line Home intravenous therapies (PICC only) 11/02/23 1100  Exposed Catheter (cm) 1 cm 11/02/23 1100  Site Assessment Clean, Dry, Intact 11/02/23 1100  Line Status Saline locked;Blood return noted 11/02/23 1100  Dressing Type Transparent;Securing device 11/02/23 1100  Dressing Status Antimicrobial disc/dressing in place;Clean, Dry, Intact 11/02/23 1100  Line Care Connections checked and tightened 11/02/23 1100  Line Adjustment (NICU/IV Team Only) No 11/02/23 1100  Dressing Intervention New dressing;Adhesive placed at insertion site (IV team only) 11/02/23 1100  Dressing Change Due 11/09/23 11/02/23 1100       Burnard Bunting Chenice 11/02/2023, 11:59 AM

## 2023-11-02 NOTE — Progress Notes (Signed)
PROGRESS NOTE    Danielle Harrison  ZOX:[id number] DOB: 1975-12-13 DOA: 10/18/2023 PCP: [facility]  201A/201A-AA  LOS: 15 days   Brief hospital course: Danielle Harrison is a 48 y.o. female with medical history Anxiety, Asthma, HLD, HTN restless leg syndrome, etoh abuse, hx of etoh pancreatitis who presents to ED with n/v/ severe abdominal pain x 48 hours.  She notes  pain mid to lower abdomen and radiates towards her back. She note no fever /chills/ chest pain or sob. Patient also noted normal bowel movements, no diarrhea or constipation. Notes n/v improved s/p treatment in ED.  Patient was found to have alcoholic necrotic pancreatitis, MRSA bacteremia most likely due to osteomyelitis of T6-T9, left foot inflammation and left great toe septic arthritis.  ID consulted, patient was on vancomycin and Zosyn, which was switched to cefepime and Flagyl.    Assessment & Plan:  # Acute ETOH pancreatitis with developing necrosis --continued to have abdominal pain.  Started having fevers.  Repeat CT a/p showed finding concerning for developing necrosis.  No organized fluid collection. --abd pain improving and is tolerating full liquids -General Surgery following recommended transfer to tertiary care center.  [facility] declined due to capacity.  General surgery signed off. On 1/12 case was discussed with Dr. Cliffton Asters at [facility], recommended conservative management and antibiotics, no need of surgical intervention at this time unless there is a large infected fluid collection. - s/p vanc, and Zosyn.  1/12 started cefepime and Flagyl d/c'd on 1/15 1/13 stared Dapto as per ID 1/14 tolerating full liquid diet, advance to soft diet, advised to start with small meals and advance as per tolerance. 1/14 CT A/P: Improving pancreatitis, without convincing necrosis, but with developing small pseudocysts as above. Small left and trace right pleural effusions with bilateral lower lobe atelectasis.   #  Sepsis due to MRSA bacteremia Hemodynamically stable. Patient denies hardware, hx ivdu, new piv yesterday S/p vanc and Zosyn, from 1/12 -1/15  cefepime and Flagyl. 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  Continue antibiotic for 6 weeks till 12/11/2023 1/19 blood culture negative for 3 days, PICC line order placed as per ID   # Acute osteomyelitis of first MPJ  1/13 Bone, biopsy, Left first metatarsal head : Acute osteomyelitis  Right MTP swelling MRI shows possible septic arthritis great toe and fluid collection in the foot possible abscess S/p cefepime and Flagyl Podiatry consulted, s/p right great toe washout and culture sent on 1/13.  Fluid culture growing MRSA 1/16 s/p 1. Resection of first metatarsal phalangeal joint including head of first metatarsal and base of proximal phalanx, left. 2.  Insertion of antibiotic cement spacer, left foot. 3.  Delayed primary closure of the surgical wound, 5 x 1 cm, left foot 1/17 patient was cleared by podiatry for discharge planning and follow-up as an outpatient. ID recommended antibiotics for 6 weeks as above  # Focal small bowel intussusception in the left mid-abdomen, Resolved  Per [place]urg, likely incidental, No clinical evidence of bowel obstruction or ischemia. No  surgical intervention is needed from that standpoint either.  repeat CT a/p showed the same. 1/14 repeat CT a/p did not show any abnormality in the bowels. # Reactive ileus and constipation --resolved with aggressive  Miralax  # Acute metabolic acidosis and Lactic acidosis: resolved after IVF   # Hyponatremia, presumed due to pancreatitis --s/p ivf 1/15 started sodium chloride tablets 1 g p.o. 3 times daily for 5 days  # Hypokalemia, potassium repleted # Hypophosphatemia, Phos repleted Monitor electrolytes and replete as needed  # Hyperglycemia, Prediabetes, A1c 6.3   # Hx of HTN 1/13 metoprolol 50 mg p.o. twice daily was started 1/16 BP soft possible due to anesthesia, continue to monitor. 1/19 BP and heart rate elevated, increase metoprolol from 50 to 75 mg p.o. twice daily Monitor BP and titrate medications accordingly   # HLD not taking medications due to loss of insurance Follow outpatient   # ETOH  --no signs of withdrawal   # Thrombocytosis most likely reactive due to infection Monitor platelet count daily  # Lower extremity edema, follow-up venous duplex to rule out DVT Past h/o LE DVT during pregnancy    DVT prophylaxis: Lovenox SQ Code Status: Full code  Family Communication:  Level of care: Med-Surg Dispo:   The patient is from: home Anticipated d/c is to: home Anticipated d/c date is: Most likely in 1 to 2 days    Subjective and Interval History:  No significant events overnight, patient still has significant pain in the abdomen and back 8/10, left foot pain is 6/10, patient is having edema in bilateral lower extremities, denies any calf pain.  Patient stated that she had a DVT in the past during pregnancy. Patient is moving bowels.  Denies any nausea vomiting, no chest pain or palpitations.  No shortness of breath.   Objective: Vitals:   11/02/23 0408 11/02/23 0500 11/02/23 0600 11/02/23 0753  BP: (!) 159/103 (!) 138/90 (!) 146/105 (!) 153/97  Pulse: (!) 110   (!) 113  Resp: 16   16  Temp: 98 F (36.7 C)   98.1 F (36.7 C)  TempSrc: Oral   Oral  SpO2: 100%   100%  Weight:  56.7 kg    Height:        Intake/Output Summary (Last 24 hours) at 11/02/2023  1303 Last data filed at 11/01/2023 1906 Gross per 24 hour  Intake 240 ml  Output --  Net 240 ml    Filed Weights   10/31/23 0447 11/01/23 0500 11/02/23 0500  Weight: 57.7 kg 54.4 kg 56.7 kg    Examination:   Constitutional: NAD, AAOx3 HEENT: conjunctivae and lids normal, EOMI CV: No cyanosis.  Tachycardic, rr RESP: normal respiratory effort, on RA Neuro: II - XII grossly intact.   Msk: no spinous process tencerness. Mild swelling left first mtp LE: s/p left foot sx, dressing CDI  Data Reviewed: I have personally reviewed labs and imaging studies  CRITICAL CARE Performed by: Gillis Santa   Total critical care time: 40 minutes  Critical care time was exclusive of separately billable procedures and treating other patients.  Critical care was necessary to treat or prevent imminent or life-threatening deterioration.  Critical care was time spent personally by me on the following activities: development of treatment plan with patient and/or surrogate as well as nursing, discussions with consultants, evaluation of patient's response to treatment, examination of patient, obtaining history from patient or surrogate, ordering and performing treatments and interventions, ordering and review of laboratory studies, ordering  and review of radiographic studies, pulse oximetry and re-evaluation of patient's condition.    Gillis Santa, MD [facility] If 7PM-7AM, please contact night-coverage 11/02/2023, 1:03 PM

## 2023-11-02 NOTE — Plan of Care (Signed)

## 2023-11-02 NOTE — Progress Notes (Signed)
PT Cancellation Note  Patient Details Name: Danielle Harrison MRN: [id number] DOB: 1976/06/12   Cancelled Treatment:    Reason Eval/Treat Not Completed: Patient at procedure or test/unavailable  Pt awaiting transport for [place] this pm.  C/o headache.  Will reschedule for AM tomorrow.   Danielle Dess 11/02/2023, 3:58 PM

## 2023-11-03 DIAGNOSIS — K859 Acute pancreatitis without necrosis or infection, unspecified: Secondary | ICD-10-CM | POA: Diagnosis not present

## 2023-11-03 DIAGNOSIS — M4624 Osteomyelitis of vertebra, thoracic region: Secondary | ICD-10-CM | POA: Diagnosis not present

## 2023-11-03 DIAGNOSIS — M00072 Staphylococcal arthritis, left ankle and foot: Secondary | ICD-10-CM | POA: Diagnosis not present

## 2023-11-03 DIAGNOSIS — B9562 Methicillin resistant Staphylococcus aureus infection as the cause of diseases classified elsewhere: Secondary | ICD-10-CM | POA: Diagnosis not present

## 2023-11-03 DIAGNOSIS — R7881 Bacteremia: Secondary | ICD-10-CM | POA: Diagnosis not present

## 2023-11-03 LAB — BASIC METABOLIC PANEL
Anion gap: 8 (ref 5–15)
BUN: 12 mg/dL (ref 6–20)
CO2: 23 mmol/L (ref 22–32)
Calcium: 8.9 mg/dL (ref 8.9–10.3)
Chloride: 103 mmol/L (ref 98–111)
Creatinine, Ser: 0.47 mg/dL (ref 0.44–1.00)
GFR, Estimated: >60 mL/min (ref >60)
Glucose, Bld: 151 mg/dL — ABNORMAL HIGH (ref 70–99)
Potassium: 4.8 mmol/L (ref 3.5–5.1)
Sodium: 134 mmol/L — ABNORMAL LOW (ref 135–145)

## 2023-11-03 LAB — CULTURE, BLOOD (ROUTINE X 2): Special Requests: ADEQUATE

## 2023-11-03 LAB — CBC
HCT: 27.3 % — ABNORMAL LOW (ref 36.0–46.0)
Hemoglobin: 9.1 g/dL — ABNORMAL LOW (ref 12.0–15.0)
MCH: 32.6 pg (ref 26.0–34.0)
MCHC: 33.3 g/dL (ref 30.0–36.0)
MCV: 97.8 fL (ref 80.0–100.0)
Platelets: 758 10*3/uL — ABNORMAL HIGH (ref 150–400)
RBC: 2.79 MIL/uL — ABNORMAL LOW (ref 3.87–5.11)
RDW: 13.2 % (ref 11.5–15.5)
WBC: 10.1 10*3/uL (ref 4.0–10.5)
nRBC: 0 % (ref 0.0–0.2)

## 2023-11-03 LAB — GLUCOSE, CAPILLARY
Glucose-Capillary: 138 mg/dL — ABNORMAL HIGH (ref 70–99)
Glucose-Capillary: 175 mg/dL — ABNORMAL HIGH (ref 70–99)
Glucose-Capillary: 125 mg/dL — ABNORMAL HIGH (ref 70–99)
Glucose-Capillary: 198 mg/dL — ABNORMAL HIGH (ref 70–99)
Glucose-Capillary: 177 mg/dL — ABNORMAL HIGH (ref 70–99)

## 2023-11-03 LAB — PHOSPHORUS: Phosphorus: 5 mg/dL — ABNORMAL HIGH (ref 2.5–4.6)

## 2023-11-03 LAB — CK: Total CK: 14 U/L — ABNORMAL LOW (ref 38–234)

## 2023-11-03 LAB — MAGNESIUM: Magnesium: 2.1 mg/dL (ref 1.7–2.4)

## 2023-11-03 LAB — SURGICAL PATHOLOGY

## 2023-11-03 MED ORDER — CHLORHEXIDINE GLUCONATE CLOTH 2 % EX PADS
6.0000 | MEDICATED_PAD | Freq: Every day | CUTANEOUS | Status: DC
  Administered 2023-11-03 – 2023-11-04 (×2): 6 via TOPICAL

## 2023-11-03 MED ORDER — FUROSEMIDE 10 MG/ML IJ SOLN
40.0000 mg | Freq: Two times a day (BID) | INTRAMUSCULAR | Status: AC
  Administered 2023-11-03 (×2): 40 mg via INTRAVENOUS
  Filled 2023-11-03 (×2): qty 4

## 2023-11-03 MED ORDER — METOPROLOL TARTRATE 50 MG PO TABS
100.0000 mg | ORAL_TABLET | Freq: Two times a day (BID) | ORAL | Status: DC
  Administered 2023-11-03 – 2023-11-04 (×2): 100 mg via ORAL
  Filled 2023-11-03 (×2): qty 2

## 2023-11-03 NOTE — Progress Notes (Signed)
  Subjective:  Patient ID: Danielle Harrison, female    DOB: 04-19-1976,  MRN: [id number]  Chief Complaint  Patient presents with   Abdominal Pain    DOS: 10/27/2023 and 10/30/2023 Procedure: 1.  Resection of first metatarsal phalangeal joint including head of first metatarsal and base of proximal phalanx, left 2.  Insertion of antibiotic cement spacer, left foot 3.  Delayed primary closure of the surgical wound, 5 x 1 cm, left foot  48 y.o. female seen for post op check.   Patient reports she is doing well.  She denies pain.  She has been trying to stay off the left foot. Waiting for DC plan.   Review of Systems: Negative except as noted in the HPI. Denies N/V/F/Ch.   Objective:   Vitals:   11/03/23 0805 11/03/23 0828  BP: (!) 167/108   Pulse: (!) 111 (!) 105  Resp: 16   Temp: 98 F (36.7 C)   SpO2: 99%    Body mass index is 24.93 kg/m. Constitutional Well developed. Well nourished.  Vascular Foot warm and well perfused. Capillary refill normal to all digits.   No calf pain with palpation  Neurologic Normal speech. Oriented to person, place, and time. Epicritic sensation diminished to left foot  Dermatologic Left foot incision healing well with no erythema drainage or dehiscence.   Orthopedic: Status post left first MPJ resection with antibiotic spacer and delayed closure of wound.   Radiographs: Antibiotic spacer present in the first MPJ.  Pathology: Pending  Micro: Pending no growth so far  Assessment:   Septic arthritis first MPJ left foot status post repeat debridement with first MPJ resection and antibiotic spacer and delayed primary closure postop day 4  Plan:  Patient was evaluated and treated and all questions answered.  POD # 4 s/p MPJ resection and delayed primary closure with antibiotic spacer implantation -Progressing as expected postop no acute complications.  Leukocytosis resolved -XR: Antibiotic cement spacer in the first MPJ at site of resected  bone -WB Status: Weightbearing as tolerated in postop shoe -Sutures: To remain intact. -Medications/ABX: Antibiotics per infectious disease appreciate recommendations -Dressing changed. Leave new dsg intact until follow up in 7 days. -Danielle sign off at this time.  Patient stable for discharge from my standpoint follow-up in office in 1 week for wound check.          Corinna Gab, DPM [facility]

## 2023-11-03 NOTE — Plan of Care (Signed)

## 2023-11-03 NOTE — Plan of Care (Signed)
  Problem: Education: Goal: Ability to describe self-care measures that may prevent or decrease complications (Diabetes Survival Skills Education) will improve Outcome: Progressing Goal: Individualized Educational Video(s) Outcome: Progressing   Problem: Coping: Goal: Ability to adjust to condition or change in health will improve Outcome: Progressing   Problem: Health Behavior/Discharge Planning: Goal: Ability to identify and utilize available resources and services will improve Outcome: Progressing Goal: Ability to manage health-related needs will improve Outcome: Progressing   Problem: Fluid Volume: Goal: Ability to maintain a balanced intake and output will improve Outcome: Progressing

## 2023-11-03 NOTE — Progress Notes (Signed)
PROGRESS NOTE    Danielle Harrison  QMV:[id number] DOB: May 23, 1976 DOA: 10/18/2023 PCP: [facility]  [id number]/201A-AA  LOS: 16 days   Brief hospital course: Danielle Harrison is a 48 y.o. female with medical history Anxiety, Asthma, HLD, HTN restless leg syndrome, etoh abuse, hx of etoh pancreatitis who presents to ED with n/v/ severe abdominal pain x 48 hours.  She notes  pain mid to lower abdomen and radiates towards her back. She note no fever /chills/ chest pain or sob. Patient also noted normal bowel movements, no diarrhea or constipation. Notes n/v improved s/p treatment in ED.  Patient was found to have alcoholic necrotic pancreatitis, MRSA bacteremia most likely due to osteomyelitis of T6-T9, left foot inflammation and left great toe septic arthritis.  ID consulted, patient was on vancomycin and Zosyn, which was switched to cefepime and Flagyl.    Assessment & Plan:  # Acute ETOH pancreatitis with developing necrosis --continued to have abdominal pain.  Started having fevers.  Repeat CT a/p showed finding concerning for developing necrosis.  No organized fluid collection. --abd pain improving and is tolerating full liquids -General Surgery following recommended transfer to tertiary care center.  [facility] and [facility] declined due to capacity.  General surgery signed off. On 1/12 case was discussed with Dr. Cliffton Asters at [facility], recommended conservative management and antibiotics, no need of surgical intervention at this time unless there is a large infected fluid collection. - s/p vanc, and Zosyn.  1/12 started cefepime and Flagyl d/c'd on 1/15 1/13 stared Dapto as per ID 1/14 tolerating full liquid diet, advance to soft diet, advised to start with small meals and advance as per tolerance. 1/14 CT A/P: Improving pancreatitis, without convincing necrosis, but with developing small pseudocysts as above. Small left and trace right pleural effusions with bilateral lower lobe atelectasis.   #  Sepsis due to MRSA bacteremia Hemodynamically stable. Patient denies hardware, hx ivdu, new piv yesterday S/p vanc and Zosyn, from 1/12 -1/15  cefepime and Flagyl. 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  Continue antibiotic for 6 weeks till 12/11/2023 1/19 blood culture negative for 3 days, PICC line order placed as per ID   # Acute osteomyelitis of first MPJ  1/13 Bone, biopsy, Left first metatarsal head : Acute osteomyelitis  Right MTP swelling MRI shows possible septic arthritis great toe and fluid collection in the foot possible abscess S/p cefepime and Flagyl Podiatry consulted, s/p right great toe washout and culture sent on 1/13.  Fluid culture growing MRSA 1/16 s/p 1. Resection of first metatarsal phalangeal joint including head of first metatarsal and base of proximal phalanx, left. 2.  Insertion of antibiotic cement spacer, left foot. 3.  Delayed primary closure of the surgical wound, 5 x 1 cm, left foot 1/17 patient was cleared by podiatry for discharge planning and follow-up as an outpatient. ID recommended antibiotics for 6 weeks as above  # Focal small bowel intussusception in the left mid-abdomen, Resolved  Per [place], likely incidental, No clinical evidence of bowel obstruction or ischemia. No  surgical intervention is needed from that standpoint either.  repeat CT a/p showed the same. 1/14 repeat CT a/p did not show any abnormality in the bowels. # Reactive ileus and constipation --resolved with aggressive  Miralax  # Acute metabolic acidosis and Lactic acidosis: resolved after IVF   # Hyponatremia, presumed due to pancreatitis --s/p ivf 1/15 started sodium chloride tablets 1 g p.o. 3 times daily for 5 days 1/20 Na 134 improving  # Hypokalemia, potassium repleted # Hypophosphatemia, Phos repleted Monitor electrolytes and replete as needed  # Hyperglycemia, Prediabetes, A1c 6.3   # Hx of HTN 1/13 metoprolol 50 mg p.o. twice daily was started 1/16 BP soft possible due to anesthesia, continue to monitor. 1/20 BP and heart rate elevated, increase metoprolol from 75--100 mg p.o. twice daily Monitor BP and titrate medications accordingly   # HLD not taking medications due to loss of insurance Follow outpatient   # ETOH  --no signs of withdrawal   # Thrombocytosis most likely reactive due to infection Monitor platelet count daily  # Lower extremity edema,  Past h/o LE DVT during pregnancy  Venous duplex negative for DVT 1/20Lasix 40 mg IV twice daily x 2 doses ordered   DVT prophylaxis: Lovenox SQ Code Status: Full code  Family Communication:  Level of care: Med-Surg Dispo:   The patient is from: home Anticipated d/c is to: home Anticipated d/c date is: Most likely in 1 to 2 days  1/20 patient does not have insurance, ID will get in touch with someone to check on outpatient antibiotic cost.  Subjective and Interval History:  No significant events overnight, pain in the back and 46/10, dressing was changed today so her foot is hurting more today.  she denied any other complaints.   Objective: Vitals:   11/03/23 0715 11/03/23 0805 11/03/23 0828 11/03/23 1640  BP:  (!) 167/108  (!) 165/106  Pulse:  (!) 111 (!) 105 (!) 102  Resp:  16  16  Temp:  98 F (36.7 C)  97.7 F (36.5 C)  TempSrc:  Oral    SpO2:  99%  100%  Weight: 57.9 kg     Height:        Intake/Output Summary (Last 24 hours) at 11/03/2023 1707 Last data filed at 11/03/2023 1100 Gross per 24 hour  Intake  240 ml  Output --  Net 240 ml    Filed Weights   11/01/23 0500 11/02/23 0500 11/03/23 0715  Weight: 54.4 kg 56.7 kg 57.9 kg    Examination:   Constitutional: NAD, AAOx3 HEENT: conjunctivae and lids normal, EOMI CV: No cyanosis.  Tachycardic, rr RESP: normal respiratory effort, on RA Neuro: II - XII grossly intact.   Msk: no spinous process tencerness. Mild swelling left first mtp LE: s/p left foot sx, dressing CDI  Data Reviewed: I have personally reviewed labs and imaging studies  CRITICAL CARE Performed by: Gillis Santa   Total critical care time: 40 minutes  Critical care time was exclusive of separately billable procedures and treating other patients.  Critical care was necessary to treat or prevent imminent or life-threatening deterioration.  Critical care was time spent personally by me on the following activities: development of treatment plan with patient and/or surrogate as well as nursing, discussions with consultants, evaluation of patient's response to treatment, examination of patient, obtaining history from patient or surrogate, ordering and performing treatments and interventions, ordering and review of laboratory studies, ordering and review of radiographic studies, pulse oximetry and re-evaluation  of patient's condition.    Gillis Santa, MD [facility] If 7PM-7AM, please contact night-coverage 11/03/2023, 5:07 PM

## 2023-11-03 NOTE — Progress Notes (Signed)
Date of Admission:  10/18/2023   T  ID: Danielle Harrison is a 48 y.o. 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)   Abscess of left foot   MRSA bacteremia   Acute osteomyelitis of thoracic spine (HCC)   Acute bacterial endocarditis   Acute osteomyelitis of left foot (HCC)    Subjective: Doing better She is ambulating with a walker  Medications:   vitamin C  500 mg Oral BID   bisacodyl  10 mg Oral QHS   enoxaparin (LOVENOX) injection  40 mg Subcutaneous Q24H   feeding supplement  237 mL Oral BID BM   folic acid  1 mg Oral Daily   furosemide  40 mg Intravenous BID   insulin aspart  0-9 Units Subcutaneous TID WC   insulin glargine-yfgn  8 Units Subcutaneous Daily   metoprolol tartrate  100 mg Oral BID   multivitamin with minerals  1 tablet Oral Daily   polyethylene glycol  17 g Oral BID   sodium chloride flush  10-40 mL Intracatheter Q12H   sodium chloride  1 g Oral TID WC   thiamine  100 mg Oral Daily   Or   thiamine  100 mg Intravenous Daily    Objective: Vital signs in last 24 hours: Patient Vitals for the past 24 hrs:  BP Temp Temp src Pulse Resp SpO2 Weight  11/03/23 0828 -- -- -- (!) 105 -- -- --  11/03/23 0805 (!) 167/108 98 F (36.7 C) Oral (!) 111 16 99 % --  11/03/23 0715 -- -- -- -- -- -- 57.9 kg  11/02/23 2045 -- -- -- 100 -- -- --  11/02/23 2044 (!) 157/107 98.9 F (37.2 C) Oral -- 16 100 % --  11/02/23 1646 (!) 139/97 98.4 F (36.9 C) Oral (!) 109 16 100 % --  11/02/23 1600 (!) 150/97 98.1 F (36.7 C) Oral 100 16 -- --      PHYSICAL EXAM:  General: Awake.  Alert , no distress Lungs: Clear to auscultation bilaterally. No Wheezing or Rhonchi. No rales. Heart: Regular rate and rhythm, no murmur, rub or gallop. Abdomen: fullness in the epigastrium Extremities: left foot dressing  Rt  PICC  Lab Results    Latest Ref Rng & Units 11/03/2023    6:40 AM 11/02/2023    3:53 AM 11/01/2023    4:07 AM  CBC  WBC 4.0 - 10.5 K/uL 10.1  10.0  10.6   Hemoglobin 12.0 - 15.0 g/dL 9.1  9.2  8.7   Hematocrit 36.0 - 46.0 % 27.3  27.3  26.1   Platelets 150 - 400 K/uL 758  739  697        Latest Ref Rng & Units 11/03/2023    6:40 AM 11/02/2023    3:53 AM 11/01/2023    4:07 AM  CMP  Glucose 70 - 99 mg/dL 086  761  950   BUN 6 - 20 mg/dL 12  12  8    Creatinine 0.44 - 1.00 mg/dL 9.32  6.71  2.45   Sodium 135 - 145 mmol/L 134  129  130   Potassium 3.5 - 5.1 mmol/L 4.8  4.6  4.7   Chloride 98 - 111 mmol/L 103  99  100   CO2 22 - 32 mmol/L 23  21  24  Calcium 8.9 - 10.3 mg/dL 8.9  8.9  8.7       Microbiology: [facility] 10/23/23 MRSA bacteremia 4/4 10/25/23 MRSA bacteremia 4/4 1/12 BC- MRSA 1/13 toe cultures - NG X 3 1/16 BC- NG Studies/Results: US Venous Img Lower Bilateral (DVT) Result Date: 11/02/2023 CLINICAL DATA:  Lower extremity edema EXAM: BILATERAL LOWER EXTREMITY VENOUS DOPPLER ULTRASOUND TECHNIQUE: Gray-scale sonography with compression, as well as color and duplex ultrasound, were performed to evaluate the deep venous system(s) from the level of the common femoral vein through the popliteal and proximal calf veins. COMPARISON:  None Available. FINDINGS: VENOUS Normal compressibility of the common femoral, superficial femoral, and popliteal veins, as well as the visualized calf veins. Visualized portions of profunda femoral vein and great saphenous vein unremarkable. No filling defects to suggest DVT on grayscale or color Doppler imaging. Doppler waveforms show normal direction of venous flow, normal respiratory plasticity and response to augmentation. OTHER None. Limitations: none IMPRESSION: Negative. Electronically Signed   By: Charlett Nose M.D.   On: 11/02/2023 18:28   [place] EKG SITE RITE Result Date: 11/02/2023 If Site Rite image not attached, placement could not be confirmed due to  current cardiac rhythm.   Ct abdomen- acute pancreatitis  Assessment/Plan: Acute pancreatitis   repeat CT ( 10/28/23) did not show any abscess in pancreas or necrosis- 2 small pseudocyst     MRSA bacteremia-   -- 4 days into hospital stay no blood culture done on admission so would never know it was POA or noscomial There are metastatic lesions  - thoracic spine infection, left great toe infection with Osteomyelitis - surprising  that seeding happened quickly  in the hospital TEE negative  there was a question whether the pancreas was infected with MRSA- no obviosu abscess   Pt currently on daptomycin. Will need 6 weeks of IV antibiotic from the first negative culture- until 12/10/23 Repeat Blood culture sent on 1/16 is negative  Pt has no insurance- so she will have to be self pay for OPAT which is $ 33/day Not sure what the cost would be for Same day surgery- she wanted to go to day surgery May work out cheaper for OPAT- pt wants to talk to case manager first before she decides    Left great toe MRSA septic arthritis and local abscess-pathology of met head biopsy shows acute osteomyelitis- Underwent Resection of first metatarsal head and  proximal phalanx, left Margin has chronic in active osteo     Anemia   Leucoytosis - has resolved  ETOH use   Past h/o transverse myelitis in 2011    Discussed the management with patient and care team OPAT written

## 2023-11-03 NOTE — TOC Progression Note (Signed)
Transition of Care [facility]) - Progression Note    Patient Details  Name: Danielle Harrison MRN: [id number] Date of Birth: November 03, 1975  [facility]) CM/SW Contact  Garret Reddish, RN Phone Number: 11/03/2023, 9:20 AM  Clinical Narrative:    Chart reviewed.  Received call from [facility] with [employer] and Kandee Keen with [place].  Pam and Kandee Keen both informed me that they have ran patient's insurance and it is coming back and in active.  I have spoken with patient and confirmed that she currently does not have insurance.  [employer] infusion company does not provide charity for home health infusion.  They can only offer payment plan assistance.  I have made Dr. Lucianne Muss aware.  Patient may have to be set up with outpatient infusion. Dr. Rivka Safer also made aware.    TOC will continue to follow for discharge planning.      Expected Discharge Plan: Home w Home Health Services    Expected Discharge Plan and Services                                               Social Determinants of Health (SDOH) Interventions SDOH Screenings   Food Insecurity: No Food Insecurity (10/21/2023)  Housing: High Risk (10/21/2023)  Transportation Needs: No Transportation Needs (10/21/2023)  Utilities: Not At Risk (10/21/2023)  Tobacco Use: Medium Risk (10/30/2023)    Readmission Risk Interventions     No data to display

## 2023-11-03 NOTE — Treatment Plan (Signed)
Diagnosis: MRSA bacteremia Thoracic spine osteomyelitis [place] great toe osteo Baseline Creatinine <1    Allergies  Allergen Reactions   Diclofenac Hives    OPAT Orders Daptomycin 500mg  IV every 24 hrs Duration: 6 weeks End Date: 12/10/23  [facility] Care Per Protocol:  Labs weekly while on IV antibiotics: _X_ CBC with differential _X_ CK _X_ CMP _X_ CRP _X_ ESR   _X_ Please pull PIC at completion of IV antibiotics   Fax weekly lab results  promptly to [phone]  Clinic Follow Up Appt: Zayan Delvecchio on 12/04/23 at 10.30 am   Call [phone] with critical value or questions

## 2023-11-03 NOTE — Progress Notes (Signed)
Physical Therapy Treatment Patient Details Name: Danielle Harrison MRN: [id number] DOB: 05-29-1976 Today's Date: 11/03/2023   History of Present Illness Elvie L. Fults is a 48 year old female with ongoing alcohol use, repeated history of alcoholic pancreatitis, asthma, HTN was admitted last week with recurrent alcoholic pancreatitis. Patient was found to have alcoholic necrotic pancreatitis, MRSA bacteremia most likely due to osteomyelitis of T6-T9, left foot inflammation and left great toe septic arthritis. Patient is s/p L first metatarsal phalangeal joint resection, antibiotic space, and washout.    PT Comments  Pt standing in room with no AD or post op shoe.  When questioned she stated she was told by MD she could put her weight on it in post op shoe.  She sits and dons shoe on her own.  Confirmed in Podiatry note dated 1/17 WBAT in post op shoe.  She is encouraged to use walker for stability and is able to walk x 1 lap on unit with supervision.  Reports minimal pain.  Encouraged to continue to use post op shoe and RW at home.   If plan is discharge home, recommend the following: A little help with walking and/or transfers;Assist for transportation;Help with stairs or ramp for entrance   Can travel by private vehicle        Equipment Recommendations  Rolling walker (2 wheels);Other (comment)    Recommendations for Other Services       Precautions / Restrictions Precautions Precautions: None Restrictions Weight Bearing Restrictions Per Provider Order: Yes LLE Weight Bearing Per Provider Order: Weight bearing as tolerated     Mobility  Bed Mobility Overal bed mobility: Modified Independent               Patient Response: Cooperative  Transfers Overall transfer level: Needs assistance Equipment used: Rolling walker (2 wheels) Transfers: Sit to/from Stand Sit to Stand: Modified independent (Device/Increase time)                Ambulation/Gait Ambulation/Gait  assistance: Modified independent (Device/Increase time) Gait Distance (Feet): 160 Feet Assistive device: Rolling walker (2 wheels) Gait Pattern/deviations: Step-through pattern, Decreased step length - right, Decreased step length - left Gait velocity: Decreased     General Gait Details: steady gait with minimal pain with post op shoe   Stairs             Wheelchair Mobility     Tilt Bed Tilt Bed Patient Response: Cooperative  Modified Rankin (Stroke Patients Only)       Balance Overall balance assessment: Needs assistance Sitting-balance support: Feet supported, No upper extremity supported Sitting balance-Leahy Scale: Normal     Standing balance support: Bilateral upper extremity supported, During functional activity, Reliant on assistive device for balance Standing balance-Leahy Scale: Good Standing balance comment: steady with RW                            Cognition Arousal: Alert Behavior During Therapy: WFL for tasks assessed/performed Overall Cognitive Status: Within Functional Limits for tasks assessed                                          Exercises      General Comments        Pertinent Vitals/Pain Pain Assessment Pain Assessment: Faces Faces Pain Scale: Hurts a little bit Pain Location: L Foot, Abdomen/Back Pain Descriptors /  Indicators: Aching, Tender Pain Intervention(s): Limited activity within patient's tolerance, Monitored during session    Home Living                          Prior Function            PT Goals (current goals can now be found in the care plan section) Progress towards PT goals: Progressing toward goals    Frequency    Min 1X/week      PT Plan      Co-evaluation              AM-PAC PT "6 Clicks" Mobility   Outcome Measure  Help needed turning from your back to your side while in a flat bed without using bedrails?: None Help needed moving from lying on  your back to sitting on the side of a flat bed without using bedrails?: None Help needed moving to and from a bed to a chair (including a wheelchair)?: None Help needed standing up from a chair using your arms (e.g., wheelchair or bedside chair)?: None Help needed to walk in hospital room?: None Help needed climbing 3-5 steps with a railing? : A Little 6 Click Score: 23    End of Session Equipment Utilized During Treatment: Gait belt Activity Tolerance: Patient tolerated treatment well Patient left: in bed;with call bell/phone within reach Nurse Communication: Mobility status PT Visit Diagnosis: Unsteadiness on feet (R26.81);Pain;Other abnormalities of gait and mobility (R26.89);Difficulty in walking, not elsewhere classified (R26.2) Pain - Right/Left: Left     Time: 4098-1191 PT Time Calculation (min) (ACUTE ONLY): 9 min  Charges:    $Gait Training: 8-22 mins PT General Charges $$ ACUTE PT VISIT: 1 Visit                    Danielle Dess, PTA 11/03/23, 9:54 AM

## 2023-11-04 ENCOUNTER — Other Ambulatory Visit: Payer: Self-pay

## 2023-11-04 DIAGNOSIS — K859 Acute pancreatitis without necrosis or infection, unspecified: Secondary | ICD-10-CM | POA: Diagnosis not present

## 2023-11-04 DIAGNOSIS — R531 Weakness: Secondary | ICD-10-CM | POA: Diagnosis not present

## 2023-11-04 LAB — CBC
HCT: 30.3 % — ABNORMAL LOW (ref 36.0–46.0)
Hemoglobin: 9.9 g/dL — ABNORMAL LOW (ref 12.0–15.0)
MCH: 32.7 pg (ref 26.0–34.0)
MCHC: 32.7 g/dL (ref 30.0–36.0)
MCV: 100 fL (ref 80.0–100.0)
Platelets: 837 10*3/uL — ABNORMAL HIGH (ref 150–400)
RBC: 3.03 MIL/uL — ABNORMAL LOW (ref 3.87–5.11)
RDW: 13.3 % (ref 11.5–15.5)
WBC: 8.9 10*3/uL (ref 4.0–10.5)
nRBC: 0 % (ref 0.0–0.2)

## 2023-11-04 LAB — CULTURE, BLOOD (ROUTINE X 2)
Culture: NO GROWTH
Culture: NO GROWTH

## 2023-11-04 LAB — SEDIMENTATION RATE: Sed Rate: 107 mm/h — ABNORMAL HIGH (ref 0–20)

## 2023-11-04 LAB — BASIC METABOLIC PANEL
Anion gap: 9 (ref 5–15)
BUN: 16 mg/dL (ref 6–20)
CO2: 24 mmol/L (ref 22–32)
Calcium: 9.4 mg/dL (ref 8.9–10.3)
Chloride: 100 mmol/L (ref 98–111)
Creatinine, Ser: 0.64 mg/dL (ref 0.44–1.00)
GFR, Estimated: >60 mL/min (ref >60)
Glucose, Bld: 122 mg/dL — ABNORMAL HIGH (ref 70–99)
Potassium: 5.1 mmol/L (ref 3.5–5.1)
Sodium: 133 mmol/L — ABNORMAL LOW (ref 135–145)

## 2023-11-04 LAB — C-REACTIVE PROTEIN: CRP: 4 mg/dL — ABNORMAL HIGH (ref <1.0)

## 2023-11-04 LAB — AEROBIC/ANAEROBIC CULTURE W GRAM STAIN (SURGICAL/DEEP WOUND)
Culture: NO GROWTH
Gram Stain: NONE SEEN

## 2023-11-04 LAB — MAGNESIUM: Magnesium: 2.1 mg/dL (ref 1.7–2.4)

## 2023-11-04 LAB — GLUCOSE, CAPILLARY
Glucose-Capillary: 113 mg/dL — ABNORMAL HIGH (ref 70–99)
Glucose-Capillary: 192 mg/dL — ABNORMAL HIGH (ref 70–99)
Glucose-Capillary: 134 mg/dL — ABNORMAL HIGH (ref 70–99)

## 2023-11-04 LAB — PHOSPHORUS: Phosphorus: 5.6 mg/dL — ABNORMAL HIGH (ref 2.5–4.6)

## 2023-11-04 MED ORDER — METOPROLOL TARTRATE 100 MG PO TABS
100.0000 mg | ORAL_TABLET | Freq: Two times a day (BID) | ORAL | 5 refills | Status: DC
  Filled 2023-11-04: qty 60, 30d supply, fill #0

## 2023-11-04 MED ORDER — ASCORBIC ACID 500 MG PO TABS
500.0000 mg | ORAL_TABLET | Freq: Every day | ORAL | 0 refills | Status: AC
  Filled 2023-11-04: qty 30, 30d supply, fill #0

## 2023-11-04 MED ORDER — ADULT MULTIVITAMIN W/MINERALS CH
1.0000 | ORAL_TABLET | Freq: Every day | ORAL | 0 refills | Status: DC
  Filled 2023-11-04: qty 30, 30d supply, fill #0

## 2023-11-04 MED ORDER — ACETAMINOPHEN 325 MG PO TABS
650.0000 mg | ORAL_TABLET | Freq: Three times a day (TID) | ORAL | 0 refills | Status: DC | PRN
  Filled 2023-11-04: qty 100, 17d supply, fill #0

## 2023-11-04 MED ORDER — METFORMIN HCL 500 MG PO TABS
500.0000 mg | ORAL_TABLET | Freq: Two times a day (BID) | ORAL | 5 refills | Status: DC
  Filled 2023-11-04 (×2): qty 60, 30d supply, fill #0

## 2023-11-04 MED ORDER — POLYETHYLENE GLYCOL 3350 17 GM/SCOOP PO POWD
17.0000 g | Freq: Every day | ORAL | 0 refills | Status: DC
  Filled 2023-11-04: qty 238, 14d supply, fill #0

## 2023-11-04 MED ORDER — OXYCODONE HCL 5 MG PO TABS
5.0000 mg | ORAL_TABLET | Freq: Four times a day (QID) | ORAL | 0 refills | Status: DC | PRN
  Filled 2023-11-04: qty 20, 5d supply, fill #0

## 2023-11-04 MED ORDER — DAPTOMYCIN IV (FOR PTA / DISCHARGE USE ONLY): 500.0000 mg | INTRAVENOUS | 0 refills | Status: DC

## 2023-11-04 MED ORDER — LOSARTAN POTASSIUM 25 MG PO TABS
25.0000 mg | ORAL_TABLET | Freq: Every day | ORAL | 11 refills | Status: DC
  Filled 2023-11-04: qty 30, 30d supply, fill #0

## 2023-11-04 MED ORDER — BISACODYL 5 MG PO TBEC
10.0000 mg | DELAYED_RELEASE_TABLET | Freq: Every evening | ORAL | 0 refills | Status: DC | PRN
  Filled 2023-11-04: qty 30, 15d supply, fill #0

## 2023-11-04 NOTE — Progress Notes (Signed)
PHARMACY CONSULT NOTE FOR:  OUTPATIENT  PARENTERAL ANTIBIOTIC THERAPY (OPAT)  Indication: MRSA bacteremia, thoracic discitis, toe osteomyelitis Regimen: Daptomycin 500mg  IV q24h End date: 12/10/2023  Labs - Once weekly:  CBC/D, CMP, CPK, CRP, ESR Fax weekly lab results  promptly to [phone] Please pull PIC at completion of IV antibiotics Call [phone] with critical value or questions  IV antibiotic discharge orders are pended. To discharging provider:  please sign these orders via discharge navigator,  Select New Orders & click on the button choice - Manage This Unsigned Work.     Thank you for allowing pharmacy to be a part of this patient's care.  Juliette Alcide, PharmD, BCPS, BCIDP Work Cell: [phone] 11/04/2023 2:19 PM

## 2023-11-04 NOTE — TOC Transition Note (Signed)
Transition of Care [facility]) - Discharge Note   Patient Details  Name: Danielle Harrison MRN: [id number] Date of Birth: 1976/09/03  [facility]) CM/SW Contact:  Chapman Fitch, RN Phone Number: 11/04/2023, 2:57 PM   Clinical Narrative:     After lengthy investigation it was determine that patient does have active Medicaid under he previous married name Burman Nieves with [employer] confirms they have all needed information to proceed They will provide nursing through Ladora Daniel with [facility] notified of discharge for PT  RW and TUB bench to be delivered to room by Cletis Athens with [employer]  Pam to completed education today at 5pm, and notifed of address that patient will be discharged today  Lorrene Reid to provide transport         Patient Goals and CMS Choice            Discharge Placement                       Discharge Plan and Services Additional resources added to the After Visit Summary for                                       Social Drivers of Health (SDOH) Interventions SDOH Screenings   Food Insecurity: No Food Insecurity (10/21/2023)  Housing: High Risk (10/21/2023)  Transportation Needs: No Transportation Needs (10/21/2023)  Utilities: Not At Risk (10/21/2023)  Tobacco Use: Medium Risk (10/30/2023)     Readmission Risk Interventions     No data to display

## 2023-11-04 NOTE — Plan of Care (Signed)
Medication delivered to patients room from pharmacy.  [facility] IV nurse did teaching of IV home infusion.  Rx given to patient for IV antibiotics.  Patient will be discharged to home with Right upper arm PICC line.  Shower chair and walker delivered to room

## 2023-11-04 NOTE — Discharge Summary (Addendum)
[facility] Discharge Summary   Patient: Danielle Harrison WCB:[id number]  PCP: [facility]  Date of admission: 10/18/2023   Date of discharge:  11/04/2023     Discharge Diagnoses:  Principal Problem:   Acute pancreatitis Active Problems:   Small bowel intussusception (HCC)   Pancreatitis   Septic arthritis of left foot (HCC)   Acute hematogenous osteomyelitis of left foot (HCC)   Abscess of left foot   MRSA bacteremia   Acute osteomyelitis of thoracic spine (HCC)   Acute bacterial endocarditis   Acute osteomyelitis of left foot (HCC)   Admitted From: Home Disposition:  Home with [facility]  Recommendations for Outpatient Follow-up:  F/u with PCP in 1 wk, continue antibiotics for total 6 weeks till 12/10/2023 Weekly labs, CBC with differential, CK, CMP, CRP, and ESR as per ID, Monitor BP at home and follow-up with PCP to titrate medication accordingly. Started metoprolol and losartan. Patient has mild hyperglycemia, prediabetic, started metformin.  Continue diabetic diet and follow-up with PCP Follow up LABS/TEST:  as above   Follow-up Information     [facility] Follow up in 1 week(s).   Contact information: [address] [place] [address] [phone]         Pilar Plate, DPM Follow up in 1 week(s).   Specialty: Actuary information: [address] [place] [address] [phone]                Diet recommendation: Cardiac and Carb modified diet  Activity: The patient is advised to gradually reintroduce usual activities, as tolerated  Discharge Condition: stable  Code Status: Full code   History of present illness: As per the H and P dictated on admission Hospital Course:  Danielle Harrison is a 48 y.o. female with medical history Anxiety, Asthma, HLD, HTN restless leg syndrome, etoh abuse, hx of etoh pancreatitis who presents to ED with n/v/ severe abdominal pain x 48 hours.  She notes  pain  mid to lower abdomen and radiates towards her back. She note no fever /chills/ chest pain or sob. Patient also noted normal bowel movements, no diarrhea or constipation. Notes n/v improved s/p treatment in ED.  Patient was found to have alcoholic necrotic pancreatitis, MRSA bacteremia most likely due to osteomyelitis of T6-T9, left foot inflammation and left great toe septic arthritis.  ID consulted, patient was on vancomycin and Zosyn, which was switched to cefepime and Flagyl.    Assessment & Plan:   # Acute ETOH pancreatitis with developing necrosis Repeat CT a/p showed finding concerning for developing necrosis.  No organized fluid collection. General Surgery following recommended transfer to tertiary care center.  [facility] and [facility] declined due to capacity.  General surgery signed off. On 1/12 case was discussed with Dr. Cliffton Asters at [facility], recommended conservative management and antibiotics, no need of surgical intervention at this time unless there is a large infected fluid collection. Abdominal pain gradually improving, tolerated diet and advance to soft diet.  No nausea vomiting or diarrhea. s/p vanc, and Zosyn. On 1/12 started cefepime and Flagyl d/c'd on 1/15, On 1/13 stared Dapto as per ID.  1/14 CT A/P: Improving pancreatitis, without convincing necrosis, but with developing small pseudocysts as above. Small left and trace right pleural effusions with bilateral lower lobe atelectasis.     # Sepsis due to MRSA bacteremia: Hemodynamically stable. Patient denies hardware, hx ivdu. S/p vanc and Zosyn, from 1/12 -1/15  cefepime and Flagyl. 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  1/16 blood culture NGTD.  Continue antibiotic for 6 weeks till 12/10/2023 1/19 blood culture negative for 3 days, PICC line was inserted as per ID  # Acute osteomyelitis of first MPJ  1/13 Bone, biopsy, Left first metatarsal head : Acute osteomyelitis MRI shows possible septic arthritis great toe and fluid collection in the foot possible abscess S/p cefepime and Flagyl Podiatry consulted, s/p right great toe washout and culture sent on 1/13.  Fluid culture growing MRSA 1/16 s/p 1. Resection of first metatarsal phalangeal joint including head of first metatarsal and base of proximal phalanx, left. 2.  Insertion of antibiotic cement spacer, left foot. 3.  Delayed primary closure of the surgical wound, 5 x 1 cm, left foot 1/17 patient was cleared by podiatry for discharge planning and follow-up as an outpatient. ID recommended antibiotics for 6 weeks as above   # Focal small bowel intussusception in the left mid-abdomen, Resolved  Per [place], likely incidental, No clinical evidence of bowel obstruction or ischemia. No surgical intervention is needed from that standpoint either.  repeat CT a/p showed the same. 1/14 repeat CT a/p did not show any abnormality in the bowels. # Reactive ileus and constipation: resolved with aggressive Miralax.  Continue MiraLAX and Dulcolax as needed # Acute metabolic acidosis and Lactic acidosis: resolved after IVF # Hyponatremia, presumed due to pancreatitis. Resolved s/p IVF and Nacl tabs # Hypokalemia, potassium repleted. Resolved # Hypophosphatemia, Phos repleted. Resolved # Hyperglycemia, Prediabetes, A1c 6.3, s/p Semglee and sliding scale during hospital stay.  Started metformin 500 mg p.o. twice daily.  Continue diabetic diet and follow with PCP. # Hx of HTN.  1/13 metoprolol 50 mg p.o. twice daily was started 1/16 BP soft possible due to anesthesia, continue to monitor. 1/20 BP and heart rate elevated,  increase metoprolol from 75--100 mg p.o. twice daily Continue to monitor BP at home and follow with PCP to titrate medications accordingly # HLD not taking medications due to loss of insurance, Follow with PCP outpatient # ETOH use: No withdrawal symptoms.  Alcohol abstinence counseling done. # Thrombocytosis most likely reactive due to infection.  Follow-up with PCP.  Repeat CBC weekly # Lower extremity edema: Past h/o LE DVT during pregnancy . Venous duplex negative for DVT 1/20 Lasix 40 mg IV twice daily x 2 doses ordered  Body mass index is 25.66 kg/m.  Nutrition Problem: Inadequate oral intake Etiology: acute illness Nutrition Interventions:  Pain control  - [place] Controlled Substance Reporting System database could not be reviewed as website was not working. -Oxycodone as needed prescribed for pain control.  - Patient was instructed, not to drive, operate heavy machinery, perform activities at heights, swimming or participation in water activities or provide baby sitting services while on Pain, Sleep and Anxiety Medications; until her outpatient Physician has advised to do so again.  - Also recommended to not to take more than prescribed Pain, Sleep and Anxiety Medications.  Patient was seen by physical therapy, who recommended Home health, which was arranged. On the day of the discharge the patient's vitals were stable, and no other acute medical condition were reported  by patient. the patient was felt safe to be discharge at Home with Home health.  Consultants: ID, podiatry, GI, general surgery Procedures: On 1/13 s/p right great toe washout and culture sent. On 1/16 s/p 1. Resection of first metatarsal phalangeal joint including head of first metatarsal and base of proximal phalanx, left. 2.  Insertion of antibiotic cement spacer, left foot. 3.  Delayed primary closure of the surgical wound, 5 x 1 cm, left foot  Discharge Exam: General: Appear in no distress, no Rash;  Oral Mucosa Clear, moist. Cardiovascular: S1 and S2 Present, no Murmur, Respiratory: normal respiratory effort, Bilateral Air entry present and no Crackles, no wheezes Abdomen: Bowel Sound present, Soft and no tenderness, no hernia Extremities: mild Pedal edema, no calf tenderness, s/p right first MPJ surgery, dressing CDI Neurology: alert and oriented to time, place, and person affect appropriate.  Filed Weights   11/02/23 0500 11/03/23 0715 11/04/23 0440  Weight: 56.7 kg 57.9 kg 59.6 kg   Vitals:   11/04/23 0352 11/04/23 0740  BP: (!) 162/110 (!) 138/101  Pulse: 96 (!) 103  Resp: 20 18  Temp: 98.1 F (36.7 C) 98.4 F (36.9 C)  SpO2: 100% 100%    DISCHARGE MEDICATION: Allergies as of 11/04/2023       Reactions   Diclofenac Hives        Medication List     STOP taking these medications    amLODipine 10 MG tablet Commonly known as: NORVASC   cetirizine 10 MG tablet Commonly known as: ZYRTEC   famotidine 20 MG tablet Commonly known as: PEPCID   ibuprofen 200 MG tablet Commonly known as: ADVIL   ondansetron 4 MG tablet Commonly known as: ZOFRAN       TAKE these medications    acetaminophen 325 MG tablet Commonly known as: TYLENOL Take 2 tablets (650 mg total) by mouth every 8 (eight) hours as needed for mild pain (pain score 1-3), moderate pain (pain score 4-6), fever or headache. What changed:  medication strength how much to take reasons to take this   ascorbic acid 500 MG tablet Commonly known as: VITAMIN C Take 1 tablet (500 mg total) by mouth daily.   bisacodyl 5 MG EC tablet Commonly known as: DULCOLAX Take 2 tablets (10 mg total) by mouth at bedtime as needed for moderate constipation.   daptomycin IVPB Commonly known as: CUBICIN Inject 500 mg into the vein daily. Indication:  MRSA bacteremia, thoracic discitis, toe osteomyelitis First Dose: Yes Last Day of Therapy:  12/10/2023 Labs - Once weekly:  CBC/D, CMP, CPK, CRP, ESR Fax weekly  lab results  promptly to [phone] Please pull PIC at completion of IV antibiotics Call [phone] with critical value or questions Method of administration: IV Push Method of administration may be changed at the discretion of home infusion pharmacist based upon assessment of the patient and/or caregiver's ability to self-administer the medication ordered. Start taking on: November 05, 2023   metFORMIN 500 MG tablet Commonly known as: GLUCOPHAGE Take 1 tablet (500 mg total) by mouth 2 (two) times daily with a meal.   metoprolol tartrate 100 MG tablet Commonly known as: LOPRESSOR Take 1 tablet (100 mg total) by mouth 2 (two) times daily.   multivitamin with minerals Tabs tablet Take 1 tablet by mouth daily. Start taking on: November 05, 2023   oxyCODONE 5 MG immediate release tablet Commonly known as: Oxy IR/ROXICODONE Take 1 tablet (5 mg total) by mouth every 6 (six) hours as needed  for severe pain (pain score 7-10).   polyethylene glycol powder 17 GM/SCOOP powder Commonly known as: GLYCOLAX/MIRALAX Take 17 g by mouth daily.               Durable Medical Equipment  (From admission, onward)           Start     Ordered   11/04/23 1153  For home use only DME Walker rolling  Once       Question Answer Comment  Walker: With 5 Inch Wheels   Patient needs a walker to treat with the following condition Weakness      11/04/23 1153   11/04/23 1153  For home use only DME Tub bench  Once        11/04/23 1153              Discharge Care Instructions  (From admission, onward)           Start     Ordered   11/04/23 0000  Change dressing on IV access line weekly and PRN  (Home infusion instructions - [facility] )        11/04/23 1429   11/04/23 0000  Discharge wound care:       Comments: As per podiatry   11/04/23 1427           Allergies  Allergen Reactions   Diclofenac Hives   Discharge Instructions     [facility]  pharmacist to adjust dose for Vancomycin, Aminoglycosides and other anti-infective therapies as requested by physician.   Complete by: As directed    [facility] to provide Cath Flo 2mg    Complete by: As directed    Administer for PICC line occlusion and as ordered by physician for other access device issues.   Anaphylaxis Kit: Provided to treat any anaphylactic reaction to the medication being provided to the patient if First Dose or when requested by physician   Complete by: As directed    Epinephrine 1mg /ml vial / amp: Administer 0.3mg  (0.31ml) subcutaneously once for moderate to severe anaphylaxis, nurse to call physician and pharmacy when reaction occurs and call 911 if needed for immediate care   Diphenhydramine 50mg /ml IV vial: Administer 25-50mg  IV/IM PRN for first dose reaction, rash, itching, mild reaction, nurse to call physician and pharmacy when reaction occurs   Sodium Chloride 0.9% NS IV: Administer if needed for hypovolemic blood pressure drop or as ordered by physician after call to physician with anaphylactic reaction   Call MD for:  difficulty breathing, headache or visual disturbances   Complete by: As directed    Call MD for:  extreme fatigue   Complete by: As directed    Call MD for:  persistant dizziness or light-headedness   Complete by: As directed    Call MD for:  persistant nausea and vomiting   Complete by: As directed    Call MD for:  severe uncontrolled pain   Complete by: As directed    Call MD for:  temperature >100.4   Complete by: As directed    Change dressing on IV access line weekly and PRN   Complete by: As directed    Diet - low sodium heart healthy   Complete by: As directed    Discharge instructions   Complete by: As directed    F/u with PCP in 1 wk, continue antibiotics for total 6 weeks till 12/10/2023 Weekly labs, CBC with differential, CK, CMP, CRP, and ESR  as per ID, Monitor BP at home and follow-up with PCP to titrate  medication accordingly. Started metoprolol and losartan. Patient has mild hyperglycemia, prediabetic, started metformin.  Continue diabetic diet and follow-up with PCP.   Discharge wound care:   Complete by: As directed    As per podiatry   Flush IV access with Sodium Chloride 0.9% and Heparin 10 units/ml or 100 units/ml   Complete by: As directed    Home infusion instructions - [facility]   Complete by: As directed    Instructions: Flush IV access with Sodium Chloride 0.9% and Heparin 10units/ml or 100units/ml   Change dressing on IV access line: Weekly and PRN   Instructions Cath Flo 2mg : Administer for PICC Line occlusion and as ordered by physician for other access device   [facility] pharmacist to adjust dose for: Vancomycin, Aminoglycosides and other anti-infective therapies as requested by physician   Increase activity slowly   Complete by: As directed    Method of administration may be changed at the discretion of home infusion pharmacist based upon assessment of the patient and/or caregiver's ability to self-administer the medication ordered   Complete by: As directed        The results of significant diagnostics from this hospitalization (including imaging, microbiology, ancillary and laboratory) are listed below for reference.    Significant Diagnostic Studies: US Venous Img Lower Bilateral (DVT) Result Date: 11/02/2023 CLINICAL DATA:  Lower extremity edema EXAM: BILATERAL LOWER EXTREMITY VENOUS DOPPLER ULTRASOUND TECHNIQUE: Gray-scale sonography with compression, as well as color and duplex ultrasound, were performed to evaluate the deep venous system(s) from the level of the common femoral vein through the popliteal and proximal calf veins. COMPARISON:  None Available. FINDINGS: VENOUS Normal compressibility of the common femoral, superficial femoral, and popliteal veins, as well as the visualized calf veins. Visualized portions of profunda femoral vein  and great saphenous vein unremarkable. No filling defects to suggest DVT on grayscale or color Doppler imaging. Doppler waveforms show normal direction of venous flow, normal respiratory plasticity and response to augmentation. OTHER None. Limitations: none IMPRESSION: Negative. Electronically Signed   By: Charlett Nose M.D.   On: 11/02/2023 18:28   [place] EKG SITE RITE Result Date: 11/02/2023 If Site Rite image not attached, placement could not be confirmed due to current cardiac rhythm.  DG Foot 2 Views Left Result Date: 10/30/2023 CLINICAL DATA:  Postoperative. EXAM: LEFT FOOT - 2 VIEW COMPARISON:  Left foot radiograph dated 10/24/2023. FINDINGS: Status post resection and cement of the first MTP joint. No acute fracture or dislocation. There is soft tissue swelling of the forefoot. Overlying dressing noted. IMPRESSION: Postsurgical changes of the first MTP joint. Electronically Signed   By: Elgie Collard M.D.   On: 10/30/2023 13:00   ECHO TEE Result Date: 10/29/2023    TRANSESOPHOGEAL ECHO REPORT   Patient Name:   ERSEL TAHIR Seidner Date of Exam: 10/29/2023 Medical Rec #:  [id number]       Height:       60.0 in Accession #:    [id number]      Weight:       125.2 lb Date of Birth:  1976-01-24       BSA:          1.530 m Patient Age:    47 years        BP:           143/97 mmHg Patient Gender: F  HR:           95 bpm. Exam Location:  [facility] Procedure: Transesophageal Echo, Cardiac Doppler, Color Doppler and Saline            Contrast Bubble Study Indications:     Bacteremia R78.81  History:         Patient has prior history of Echocardiogram examinations, most                  recent 10/24/2023. Anxiety.  Sonographer:     Cristela Blue Referring Phys:  [id number] SHERI HAMMOCK Diagnosing Phys: Julien Nordmann MD PROCEDURE: After discussion of the risks and benefits of a TEE, an informed consent was obtained from the patient. TEE procedure time was 30 minutes. The transesophogeal probe was passed without  difficulty through the esophogus of the patient. Local oropharyngeal anesthetic was provided with Cetacaine. Sedation performed by different physician. Image quality was excellent. The patient's vital signs; including heart rate, blood pressure, and oxygen saturation; remained stable throughout the procedure. The patient developed no complications during the procedure.  IMPRESSIONS  1. Left ventricular ejection fraction, by estimation, is 55 to 60%. The left ventricle has normal function. The left ventricle has no regional wall motion abnormalities.  2. Right ventricular systolic function is normal. The right ventricular size is normal.  3. No left atrial/left atrial appendage thrombus was detected.  4. The mitral valve is normal in structure. No evidence of mitral valve regurgitation. No evidence of mitral stenosis.  5. The aortic valve is normal in structure. Aortic valve regurgitation is not visualized. No aortic stenosis is present.  6. The inferior vena cava is normal in size with greater than 50% respiratory variability, suggesting right atrial pressure of 3 mmHg.  7. Agitated saline contrast bubble study was negative, with no evidence of any interatrial shunt.  8. No valve vegetation Conclusion(s)/Recommendation(s): Normal biventricular function without evidence of hemodynamically significant valvular heart disease. FINDINGS  Left Ventricle: Left ventricular ejection fraction, by estimation, is 55 to 60%. The left ventricle has normal function. The left ventricle has no regional wall motion abnormalities. The left ventricular internal cavity size was normal in size. There is  no left ventricular hypertrophy. Right Ventricle: The right ventricular size is normal. No increase in right ventricular wall thickness. Right ventricular systolic function is normal. Left Atrium: Left atrial size was normal in size. No left atrial/left atrial appendage thrombus was detected. Right Atrium: Right atrial size was normal in  size. Pericardium: There is no evidence of pericardial effusion. Mitral Valve: The mitral valve is normal in structure. No evidence of mitral valve regurgitation. No evidence of mitral valve stenosis. There is no evidence of mitral valve vegetation. Tricuspid Valve: The tricuspid valve is normal in structure. Tricuspid valve regurgitation is not demonstrated. No evidence of tricuspid stenosis. There is no evidence of tricuspid valve vegetation. Aortic Valve: The aortic valve is normal in structure. Aortic valve regurgitation is not visualized. No aortic stenosis is present. There is no evidence of aortic valve vegetation. Pulmonic Valve: The pulmonic valve was normal in structure. Pulmonic valve regurgitation is not visualized. No evidence of pulmonic stenosis. There is no evidence of pulmonic valve vegetation. Aorta: The aortic root is normal in size and structure. Venous: The inferior vena cava is normal in size with greater than 50% respiratory variability, suggesting right atrial pressure of 3 mmHg. IAS/Shunts: No atrial level shunt detected by color flow Doppler. Agitated saline contrast was given intravenously to evaluate for intracardiac  shunting. Agitated saline contrast bubble study was negative, with no evidence of any interatrial shunt. There  is no evidence of a patent foramen ovale. There is no evidence of an atrial septal defect. Julien Nordmann MD Electronically signed by Julien Nordmann MD Signature Date/Time: 10/29/2023/6:40:31 PM    Final    CT ABDOMEN W CONTRAST Result Date: 10/28/2023 CLINICAL DATA:  Necrotizing pancreatitis EXAM: CT ABDOMEN WITH CONTRAST TECHNIQUE: Multidetector CT imaging of the abdomen was performed using the standard protocol following bolus administration of intravenous contrast. RADIATION DOSE REDUCTION: This exam was performed according to the departmental dose-optimization program which includes automated exposure control, adjustment of the mA and/or kV according to  patient size and/or use of iterative reconstruction technique. CONTRAST:  80mL OMNIPAQUE IOHEXOL 300 MG/ML  SOLN COMPARISON:  10/22/2023 FINDINGS: Lower chest: Small left and trace right pleural effusions with bilateral lower lobe atelectasis. Hepatobiliary: Liver is within normal limits. Gallbladder is unremarkable. No intrahepatic or extrahepatic dilatation. Pancreas: New 2.1 cm low-density lesion in the pancreatic head (series 2/image 30), suggesting a pseudocyst. Improving inflammatory changes along the pancreatic tail (series 2/image 25), without convincing necrosis. Inferior to the pancreatic tail is a developing well-defined fluid collection (series 2/image 28), suggesting a pseudocyst. Spleen: Within normal limits. Adrenals/Urinary Tract: Adrenal glands are within normal limits. Kidneys are within normal limits.  No hydronephrosis. Stomach/Bowel: Stomach is within normal limits. Visualized bowel is grossly unremarkable. Vascular/Lymphatic: No evidence of abdominal aortic aneurysm. No suspicious abdominal lymphadenopathy. Other: No abdominal ascites. Musculoskeletal: Grade 1 anterolisthesis of L4 on L5. IMPRESSION: Improving pancreatitis, without convincing necrosis, but with developing small pseudocysts as above. Small left and trace right pleural effusions with bilateral lower lobe atelectasis. Electronically Signed   By: Charline Bills M.D.   On: 10/28/2023 23:30   DG Chest Port 1 View Result Date: 10/28/2023 CLINICAL DATA:  Shortness of breath. EXAM: PORTABLE CHEST 1 VIEW COMPARISON:  Chest radiograph dated 10/23/2023. FINDINGS: Mild peribronchial and interstitial densities may represent reactive small airway disease or viral infection. Clinical correlation recommended no focal consolidation or pneumothorax. Trace bilateral pleural effusions may be present. The cardiac silhouette is within normal limits. No acute osseous pathology. IMPRESSION: Mild peribronchial and interstitial densities may  represent reactive small airway disease or viral infection. Electronically Signed   By: Elgie Collard M.D.   On: 10/28/2023 14:06   MR FOOT LEFT W WO CONTRAST Result Date: 10/25/2023 CLINICAL DATA:  Osteomyelitis suspected, foot, xray done follow up xray. Bacteremia EXAM: MRI OF THE LEFT FOREFOOT WITHOUT AND WITH CONTRAST TECHNIQUE: Multiplanar, multisequence MR imaging of the left forefoot was performed both before and after administration of intravenous contrast. CONTRAST:  5mL GADAVIST GADOBUTROL 1 MMOL/ML IV SOLN COMPARISON:  X-ray 10/24/2023 FINDINGS: Technical Note: Despite efforts by the technologist and patient, motion artifact is present on today's exam and could not be eliminated. This reduces exam sensitivity and specificity. Bones/Joint/Cartilage Large complex first MTP joint effusion with synovitis. Erosive changes of the base of the great toe proximal phalanx and of the first metatarsal head. Probable erosion of the tibial hallux sesamoid. Mild marrow edema within the fibular hallux sesamoid. No additional sites of bone marrow signal abnormality. No fracture or dislocation. Ligaments Intact Lisfranc ligament. First MTP joint capsule is distended. No evidence of an acute collateral ligament injury. Muscles and Tendons Mild flexor hallucis longus tenosynovitis. Mild intramuscular edema within the forefoot, likely myositis. Soft tissues Large fluid collection within the deep plantar soft tissues of the forefoot located at the  level of the second metatarsal diaphysis and measuring approximately 4.2 x 1.7 x 1.8 cm. This may be contiguous with the large first MTP joint effusion. Prominent subcutaneous edema of the dorsal foot. No additional organized fluid collections. IMPRESSION: 1. Large complex first MTP joint effusion with synovitis and erosive changes of the base of the great toe proximal phalanx, first metatarsal head, and hallux sesamoids. Findings are most suspicious for septic arthritis and  osteomyelitis. Changes related to a inflammatory or crystalline arthropathy are felt to be less likely. 2. Large fluid collection within the deep plantar soft tissues of the forefoot located at the level of the second metatarsal diaphysis and measuring approximately 4.2 x 1.7 x 1.8 cm, most compatible with abscess. This may be contiguous with the large first MTP joint effusion. 3. Mild flexor hallucis longus tenosynovitis. 4. Prominent subcutaneous edema of the dorsal foot. Electronically Signed   By: Duanne Guess D.O.   On: 10/25/2023 10:39   DG Foot 2 Views Left Result Date: 10/24/2023 CLINICAL DATA:  Great toe red and warm to touch EXAM: LEFT FOOT - 2 VIEW COMPARISON:  None Available. FINDINGS: Heterogeneous sclerosis and lucency involving base of first proximal phalanx and head of first metatarsal with slightly irregular arthropathy. Diffuse soft tissue swelling. IMPRESSION: Heterogeneous sclerosis and lucency involving base of first proximal phalanx and head of first metatarsal with slightly irregular arthropathy. Findings are suspicious for osteomyelitis/septic arthritis. MRI is recommended for further assessment. Electronically Signed   By: Jasmine Pang M.D.   On: 10/24/2023 23:22   ECHOCARDIOGRAM COMPLETE Result Date: 10/24/2023    ECHOCARDIOGRAM REPORT   Patient Name:   JOLETTA PERRIER Haydel Date of Exam: 10/24/2023 Medical Rec #:  [id number]       Height:       60.0 in Accession #:    [id number]      Weight:       120.0 lb Date of Birth:  12-Feb-1976       BSA:          1.502 m Patient Age:    47 years        BP:           124/106 mmHg Patient Gender: F               HR:           115 bpm. Exam Location:  [facility] Procedure: 2D Echo, Cardiac Doppler and Color Doppler Indications:     Bacteremia  History:         Patient has no prior history of Echocardiogram examinations.                  Signs/Symptoms:Bacteremia.  Sonographer:     Mikki Harbor Referring Phys:  [id number] Inetta Fermo LAI Diagnosing Phys:  Yvonne Kendall MD  Sonographer Comments: Technically difficult study due to poor echo windows and suboptimal parasternal window. Image acquisition challenging due to respiratory motion. IMPRESSIONS  1. Left ventricular ejection fraction, by estimation, is 60 to 65%. The left ventricle has normal function. The left ventricle has no regional wall motion abnormalities. Left ventricular diastolic parameters were normal.  2. Right ventricular systolic function is normal. The right ventricular size is normal. Tricuspid regurgitation signal is inadequate for assessing PA pressure.  3. The mitral valve is normal in structure. No evidence of mitral valve regurgitation. No evidence of mitral stenosis.  4. The aortic valve was not well visualized. Aortic valve regurgitation is mild. No aortic stenosis is  present.  5. The inferior vena cava is normal in size with greater than 50% respiratory variability, suggesting right atrial pressure of 3 mmHg. Conclusion(s)/Recommendation(s): There is irregular thickening of the aortic valve seen in the apical 3 chamber view. Coupled with mild aortic regurgitation and bacteremia, TEE is recommended to evaluate for infective endocarditis. FINDINGS  Left Ventricle: Left ventricular ejection fraction, by estimation, is 60 to 65%. The left ventricle has normal function. The left ventricle has no regional wall motion abnormalities. The left ventricular internal cavity size was normal in size. There is  no left ventricular hypertrophy. Left ventricular diastolic parameters were normal. Right Ventricle: The right ventricular size is normal. No increase in right ventricular wall thickness. Right ventricular systolic function is normal. Tricuspid regurgitation signal is inadequate for assessing PA pressure. Left Atrium: Left atrial size was normal in size. Right Atrium: Right atrial size was normal in size. Pericardium: There is no evidence of pericardial effusion. Mitral Valve: The mitral valve is  normal in structure. No evidence of mitral valve regurgitation. No evidence of mitral valve stenosis. MV peak gradient, 4.8 mmHg. The mean mitral valve gradient is 3.0 mmHg. Tricuspid Valve: The tricuspid valve is normal in structure. Tricuspid valve regurgitation is trivial. Aortic Valve: The aortic valve was not well visualized. Aortic valve regurgitation is mild. No aortic stenosis is present. Aortic valve mean gradient measures 5.0 mmHg. Aortic valve peak gradient measures 8.3 mmHg. Aortic valve area, by VTI measures 2.64  cm. Pulmonic Valve: The pulmonic valve was grossly normal. Pulmonic valve regurgitation is not visualized. No evidence of pulmonic stenosis. Aorta: The aortic root is normal in size and structure. Pulmonary Artery: The pulmonary artery is not well seen. Venous: The inferior vena cava is normal in size with greater than 50% respiratory variability, suggesting right atrial pressure of 3 mmHg. IAS/Shunts: No atrial level shunt detected by color flow Doppler.  LEFT VENTRICLE PLAX 2D LVIDd:         4.30 cm   Diastology LVIDs:         2.80 cm   LV e' medial:    12.40 cm/s LV PW:         1.00 cm   LV E/e' medial:  7.3 LV IVS:        0.80 cm   LV e' lateral:   13.45 cm/s LVOT diam:     2.00 cm   LV E/e' lateral: 6.7 LV SV:         70 LV SV Index:   46 LVOT Area:     3.14 cm  RIGHT VENTRICLE RV Basal diam:  3.60 cm RV Mid diam:    3.20 cm RV S prime:     16.50 cm/s TAPSE (M-mode): 2.4 cm LEFT ATRIUM             Index        RIGHT ATRIUM           Index LA diam:        2.50 cm 1.66 cm/m   RA Area:     12.00 cm LA Vol (A2C):   40.4 ml 26.89 ml/m  RA Volume:   29.50 ml  19.64 ml/m LA Vol (A4C):   29.9 ml 19.90 ml/m LA Biplane Vol: 38.5 ml 25.63 ml/m  AORTIC VALVE                    PULMONIC VALVE AV Area (Vmax):    2.75 cm  PV Vmax:       1.04 m/s AV Area (Vmean):   2.48 cm     PV Peak grad:  4.3 mmHg AV Area (VTI):     2.64 cm AV Vmax:           144.00 cm/s AV Vmean:          94.400 cm/s AV  VTI:            0.264 m AV Peak Grad:      8.3 mmHg AV Mean Grad:      5.0 mmHg LVOT Vmax:         126.00 cm/s LVOT Vmean:        74.400 cm/s LVOT VTI:          0.222 m LVOT/AV VTI ratio: 0.84  AORTA Ao Root diam: 3.10 cm MITRAL VALVE MV Area (PHT): 6.12 cm    SHUNTS MV Area VTI:   2.97 cm    Systemic VTI:  0.22 m MV Peak grad:  4.8 mmHg    Systemic Diam: 2.00 cm MV Mean grad:  3.0 mmHg MV Vmax:       1.09 m/s MV Vmean:      76.9 cm/s MV Decel Time: 124 msec MV E velocity: 90.10 cm/s MV A velocity: 83.80 cm/s MV E/A ratio:  1.08 Yvonne Kendall MD Electronically signed by Yvonne Kendall MD Signature Date/Time: 10/24/2023/4:56:45 PM    Final    MR Lumbar Spine W Wo Contrast Result Date: 10/24/2023 CLINICAL DATA:  Initial evaluation for severe back pain, MRSA bacteremia, infection suspected. EXAM: MRI LUMBAR SPINE WITHOUT AND WITH CONTRAST TECHNIQUE: Multiplanar and multiecho pulse sequences of the lumbar spine were obtained without and with intravenous contrast. CONTRAST:  5mL GADAVIST GADOBUTROL 1 MMOL/ML IV SOLN COMPARISON:  Prior study from 06/17/2019. FINDINGS: Segmentation: Standard. Lowest well-formed disc space labeled the L5-S1 level. Alignment: 3 mm anterolisthesis of L3 on L4, with 7 mm anterolisthesis of L4 on L5. Findings are chronic and facet mediated, and progressed from prior. Vertebrae: Vertebral body height maintained without acute or chronic fracture. Bone marrow signal intensity within normal limits. No worrisome osseous lesions. Mild degenerative reactive endplate changes present about the L4-5 interspace. Mild reactive marrow edema and enhancement noted about the bilateral L3-4 facets, felt to be consistent with reactive changes from degenerative facet arthritis. No other evidence for osteomyelitis discitis or septic arthritis. Conus medullaris and cauda equina: Conus extends to the L1-2 level. Conus and cauda equina appear normal. Paraspinal and other soft tissues: Paraspinous soft tissues  demonstrate no acute finding. No collections or inflammatory changes. Disc levels: L1-2: Disc desiccation with mild disc bulge. No spinal stenosis. Foramina remain patent. L2-3:  Normal interspace.  Mild facet spurring.  No stenosis. L3-4: 3 mm anterolisthesis. Disc desiccation with broad posterior pseudo disc bulge/uncovering. Moderate facet hypertrophy with associated small joint effusions and mild reactive marrow edema. Resultant mild narrowing of the lateral recesses bilaterally, greater on the right. Central canal remains patent. Mild right with moderate left L3 foraminal stenosis. L4-5: 7 mm anterolisthesis. Disc desiccation with broad posterior pseudo disc bulge/uncovering. Moderate facet arthrosis with associated small joint effusions. Resultant mild-to-moderate canal with bilateral subarticular stenosis. Moderate bilateral L4 foraminal narrowing. L5-S1: Disc desiccation with minimal disc bulge. Mild facet spurring. No spinal stenosis. Mild bilateral L5 foraminal narrowing. IMPRESSION: 1. No MRI evidence for acute infection within the lumbar spine. 2. 3 mm anterolisthesis of L3 on L4 with resultant mild bilateral lateral recess stenosis, with  moderate left L3 foraminal narrowing. 3. 7 mm anterolisthesis of L4 on L5 with resultant mild-to-moderate canal and bilateral subarticular stenosis, with moderate bilateral L4 foraminal narrowing. 4. Moderate facet hypertrophy at L3-4 and L4-5. Reactive edema and enhancement about the bilateral L3-4 facets due to degenerative facet arthritis. Finding could serve as a source for back pain. Electronically Signed   By: Rise Mu M.D.   On: 10/24/2023 05:16   MR THORACIC SPINE W WO CONTRAST Result Date: 10/24/2023 CLINICAL DATA:  Initial evaluation for severe back pain, worsened bacteremia. EXAM: MRI THORACIC WITHOUT AND WITH CONTRAST TECHNIQUE: Multiplanar and multiecho pulse sequences of the thoracic spine were obtained without and with intravenous contrast.  CONTRAST:  5mL GADAVIST GADOBUTROL 1 MMOL/ML IV SOLN COMPARISON:  Prior MRI from 08/14/2010. FINDINGS: Alignment: Vertebral bodies normally aligned with preservation of the normal thoracic kyphosis. Underlying trace dextroscoliosis. No significant listhesis. Vertebrae: Vertebral body height maintained without acute or chronic fracture. Bone marrow signal intensity within normal limits. No worrisome osseous lesions. Mild marrow edema and enhancement seen within the T6 through T9 vertebral bodies (series 24, image 9). Mild paraspinous inflammatory change with edema and enhancement seen within the paraspinous soft tissues at these levels (series 21, image 21 for example). No discrete soft tissue collections. Given provided history, findings are concerning for acute infection with osteomyelitis. Associated mild epidural enhancement involving the epidural space extending from T6 through T9 (series 24, image 9). No frank epidural abscess or collection. No other convincing evidence for acute infection elsewhere within the thoracic spine. Additional mild reactive endplate changes about the T3-4 and T9-10 interspaces favored to be degenerative. Cord: Normal signal and morphology. Mild epidural enhancement involving the ventral epidural space at T6 and T7, as well as the dorsal epidural space at T6 through T9. No frank epidural collection or significant stenosis. No other abnormal enhancement. Cord itself demonstrates normal signal and morphology. Paraspinal and other soft tissues: Mild paraspinous inflammation adjacent to the T6 through T9 vertebral bodies as above. No collections. Paraspinous soft tissues demonstrate no other acute finding. Small layering left pleural effusion noted. Disc levels: Degenerative disc disease with mild disc bulging and reactive endplate spurring noted throughout the thoracic spine. No focal disc herniation. No significant facet disease. No significant spinal stenosis. Foramina remain patent.  IMPRESSION: 1. Mild marrow edema and enhancement involving the T6 through T9 vertebral bodies with associated paraspinous inflammatory changes as above. Given provided history, findings are concerning for acute infection with osteomyelitis. No evidence for intervening discitis. Associated mild epidural enhancement extending from T6 through T9 without frank epidural abscess or collection. 2. Underlying thoracic spondylosis without significant stenosis or impingement. 3. Small layering left pleural effusion. Electronically Signed   By: Rise Mu M.D.   On: 10/24/2023 05:00   MR CERVICAL SPINE W WO CONTRAST Result Date: 10/24/2023 CLINICAL DATA:  Initial evaluation for severe back pain, MRSA bacteremia. EXAM: MRI CERVICAL SPINE WITHOUT AND WITH CONTRAST TECHNIQUE: Multiplanar and multiecho pulse sequences of the cervical spine, to include the craniocervical junction and cervicothoracic junction, were obtained without and with intravenous contrast. CONTRAST:  5mL GADAVIST GADOBUTROL 1 MMOL/ML IV SOLN COMPARISON:  Prior study from 06/02/2019. FINDINGS: Alignment: Straightening of the normal cervical lordosis. No listhesis. Vertebrae: Vertebral body height maintained without acute or chronic fracture. Bone marrow signal intensity within normal limits. No worrisome osseous lesions. Reactive endplate change with marrow edema and enhancement present about the C3-4 through C6-7 interspaces. These findings are felt to be most consistent with  reactive degenerative endplate changes. No other evidence for osteomyelitis discitis or septic arthritis. Cord: Normal signal and morphology. No epidural collections or abnormal enhancement. Partially empty sella noted. Visualized brain and posterior fossa otherwise unremarkable. Craniocervical junction normal. Paraspinous soft tissues within normal limits. Normal flow voids seen within the vertebral arteries bilaterally. Disc levels: C2-C3: Mild disc bulge with uncovertebral  spurring. Mild left-sided facet hypertrophy. No canal or foraminal stenosis. C3-C4: Degenerative intervertebral disc space narrowing with diffuse disc osteophyte complex. Flattening of the ventral thecal sac with resultant mild spinal stenosis. Moderate to severe right with moderate left C4 foraminal stenosis. C4-C5: Disc bulge with bilateral uncovertebral spurring. Flattening of the ventral thecal sac with no more than mild spinal stenosis. Moderate bilateral C5 foraminal narrowing. C5-C6: Degenerative vertebral disc space narrowing with diffuse disc osteophyte complex. Flattening and partial effacement of the ventral thecal sac with no more than mild spinal stenosis. Severe left with mild right C6 foraminal stenosis. C6-C7: Degenerative vertebral disc space narrowing with diffuse disc osteophyte complex. Flattening and partial effacement of the ventral thecal sac with no more than mild spinal stenosis. Superimposed mild right-sided facet hypertrophy. Moderate right with mild left C7 foraminal stenosis. C7-T1: Tiny central disc protrusion minimally indents the ventral thecal sac. Mild facet degeneration. No spinal stenosis. Foramina remain patent. IMPRESSION: 1. No MRI evidence for acute infection within the cervical spine. 2. Multilevel cervical spondylosis with resultant mild diffuse spinal stenosis at C3-4 through C6-7. 3. Multifactorial degenerative changes with resultant multilevel foraminal narrowing as above. Notable findings include moderate to severe right with moderate left C4 foraminal stenosis, moderate bilateral C5 foraminal narrowing, severe left with mild right C6 foraminal stenosis, with moderate right C7 foraminal narrowing. Electronically Signed   By: Rise Mu M.D.   On: 10/24/2023 04:44   DG Chest Port 1 View Result Date: 10/23/2023 CLINICAL DATA:  Fever.  Acute pancreatitis. EXAM: PORTABLE CHEST 1 VIEW COMPARISON:  Chest x-ray dated October 18, 2023. FINDINGS: The heart size and  mediastinal contours are within normal limits. Both lungs are clear. The visualized skeletal structures are unremarkable. IMPRESSION: No active disease. Electronically Signed   By: Obie Dredge M.D.   On: 10/23/2023 10:31   CT ABDOMEN PELVIS W CONTRAST Result Date: 10/22/2023 CLINICAL DATA:  Acute severe pancreatitis with worsening pain. EXAM: CT ABDOMEN AND PELVIS WITH CONTRAST TECHNIQUE: Multidetector CT imaging of the abdomen and pelvis was performed using the standard protocol following bolus administration of intravenous contrast. RADIATION DOSE REDUCTION: This exam was performed according to the departmental dose-optimization program which includes automated exposure control, adjustment of the mA and/or kV according to patient size and/or use of iterative reconstruction technique. CONTRAST:  OMNIPAQUE IOHEXOL 300 MG/ML  SOLN COMPARISON:  CT abdomen and pelvis 10/18/2023 FINDINGS: Lower chest: No acute abnormality. Hepatobiliary: Hepatic steatosis. Normal gallbladder. No biliary dilation. Pancreas: No significant change from 10/18/2023. Decreased attenuation of the body and tail of the pancreas with adjacent peripancreatic fat stranding. The decreased attenuation could represent developing pancreatic necrosis. No organized fluid collection. Spleen: Unremarkable. Adrenals/Urinary Tract: Normal adrenal glands. No urinary calculi or hydronephrosis. Bladder is unremarkable. Stomach/Bowel: Normal caliber large and small bowel. Small bowel-small bowel intussusception in the left lower quadrant (series 2/image 56). It is unclear if this represents unchanged or new intussusception. No focal lesion is identified as a lead point though this is not excluded via CT. The appendix is normal.Stomach is within normal limits. Vascular/Lymphatic: No significant vascular findings are present. No enlarged abdominal or  pelvic lymph nodes. Reproductive: Unremarkable. Other: No free intraperitoneal fluid or air.  Musculoskeletal: No acute fracture. Grade 1-2 anterolisthesis of L4. Grade 1 retrolisthesis of L5. IMPRESSION: 1. Unchanged acute pancreatitis. Decreased attenuation of the body and tail concerning for necrosis. No organized fluid collection. 2. Small bowel-small bowel intussusception in the left lower quadrant. It is unclear if this represents ongoing or new intussusception. No focal lesion is identified as a lead point though this is not excluded via CT. Short interval follow up CT is recommended to ensure resolution and exclude a neoplastic lead point. 3. Hepatic steatosis. Electronically Signed   By: Minerva Fester M.D.   On: 10/22/2023 23:51   US Abdomen Limited RUQ (LIVER/GB) Result Date: 10/19/2023 CLINICAL DATA:  Pancreatitis EXAM: ULTRASOUND ABDOMEN LIMITED RIGHT UPPER QUADRANT COMPARISON:  CT AP 10/18/2023 FINDINGS: Gallbladder: No gallstones or wall thickening visualized. No sonographic Murphy sign noted by sonographer. Common bile duct: Diameter: 4.5 mm. Liver: No focal lesion identified. Within normal limits in parenchymal echogenicity. Portal vein is patent on color Doppler imaging with normal direction of blood flow towards the liver. Other: Trace perihepatic free fluid. IMPRESSION: 1. No acute findings.  No gallstones identified. 2. Trace perihepatic free fluid. Electronically Signed   By: Signa Kell M.D.   On: 10/19/2023 09:17   DG Chest Port 1 View Result Date: 10/18/2023 CLINICAL DATA:  Leukocytosis EXAM: PORTABLE CHEST 1 VIEW COMPARISON:  X-ray 06/02/2022. FINDINGS: No consolidation, pneumothorax or effusion. No edema. Normal cardiopericardial silhouette. Degenerative changes of the spine. Overlapping cardiac leads. IMPRESSION: No acute cardiopulmonary disease. Electronically Signed   By: Karen Kays M.D.   On: 10/18/2023 18:43   CT ABDOMEN PELVIS W CONTRAST Result Date: 10/18/2023 CLINICAL DATA:  Pancreatitis. EXAM: CT ABDOMEN AND PELVIS WITH CONTRAST TECHNIQUE: Multidetector CT  imaging of the abdomen and pelvis was performed using the standard protocol following bolus administration of intravenous contrast. RADIATION DOSE REDUCTION: This exam was performed according to the departmental dose-optimization program which includes automated exposure control, adjustment of the mA and/or kV according to patient size and/or use of iterative reconstruction technique. CONTRAST:  75mL OMNIPAQUE IOHEXOL 300 MG/ML  SOLN COMPARISON:  01/30/2022. FINDINGS: Lower chest: Lung bases are clear. No pericardial or pleural effusions. Hepatobiliary: No focal liver abnormality is seen. No gallstones, gallbladder wall thickening, or biliary dilatation. Pancreas: Decreased attenuation identified of the mid body and tail of the pancreas adjacent peripancreatic fat stranding. These findings suggest acute pancreatitis. The hypoattenuation in the pancreatic parenchyma could represent a developing pseudocyst, but underlying parenchymal lesions not excluded. This represents an interval change from the prior study. For this reason, follow up is recommended to ensure resolution and exclude underlying parenchymal lesions. No fluid collections to suggest abscess and no evidence of pancreatic hemorrhage. Spleen: Normal in size without focal abnormality. Adrenals/Urinary Tract: Adrenal glands are unremarkable. Kidneys are normal, without renal calculi, focal lesion, or hydronephrosis. Bladder is unremarkable. Stomach/Bowel: Unremarkable appearance of the stomach. No bowel dilatation to suggest obstruction. Note is made of a focal small bowel intussusception in the left midabdomen. No lesion is identified to explain presence of intussusception. Follow up suggested. Loops of small bowel are fluid-filled and mildly dilated in the mid to lower abdomen likely reactive ileus. Normal stool and air in the colon and rectosigmoid. Appendix is not seen and there is no suggestion of appendicitis. Vascular/Lymphatic: Aortic  atherosclerosis. No enlarged abdominal or pelvic lymph nodes. Reproductive: Uterus and bilateral adnexa are unremarkable. Other: No abdominal wall hernia or  abnormality. No abdominopelvic ascites. Musculoskeletal: Grade 1 L5 retrolisthesis. L4-5 disc space narrowing consistent with degenerative disc disease. IMPRESSION: 1. Findings consistent with acute pancreatitis. Hypoattenuation in the pancreatic parenchyma could represent a developing pseudocyst. Follow up is recommended to ensure resolution and exclude underlying parenchymal lesions. 2. Focal small bowel intussusception in the left midabdomen. Follow up suggested. 3. Probable reactive small bowel ileus. 4. Aortic atherosclerosis (ICD10-I70.0). Electronically Signed   By: Layla Maw M.D.   On: 10/18/2023 17:12    Microbiology: Recent Results (from the past 240 hours)  Culture, blood (Routine X 2) w Reflex to ID Panel     Status: Abnormal   Collection Time: 10/25/23  4:53 PM   Specimen: BLOOD  Result Value Ref Range Status   Specimen Description   Final    BLOOD BLOOD RIGHT HAND Performed at [facility], [address] [place], [place] [address]    Special Requests   Final    BOTTLES DRAWN AEROBIC AND ANAEROBIC Blood Culture results may not be optimal due to an inadequate volume of blood received in culture bottles Performed at [facility], [address] [place], [place] [address]    Culture  Setup Time   Final    GRAM POSITIVE COCCI AEROBIC BOTTLE ONLY CRITICAL RESULT CALLED TO, READ BACK BY AND VERIFIED WITH: P/C WILL ANDERSON @ 2010 10/23/23 [facility]    Culture (A)  Final    STAPHYLOCOCCUS AUREUS SUSCEPTIBILITIES PERFORMED ON PREVIOUS CULTURE WITHIN THE LAST 5 DAYS. Performed at [facility], [address] [place], [place] [address]    Report Status 10/27/2023 FINAL  Final  Culture, blood (Routine X 2) w Reflex to ID Panel     Status: Abnormal   Collection Time: 10/25/23  4:53 PM   Specimen:  BLOOD  Result Value Ref Range Status   Specimen Description   Final    BLOOD BLOOD RIGHT ARM Performed at [facility], [address], [place], [place] [address]    Special Requests   Final    BOTTLES DRAWN AEROBIC ONLY Blood Culture results may not be optimal due to an inadequate volume of blood received in culture bottles Performed at [facility], [address], [place], [place] [address]    Culture  Setup Time   Final    GRAM POSITIVE COCCI AEROBIC BOTTLE ONLY CRITICAL VALUE NOTED.  VALUE IS CONSISTENT WITH PREVIOUSLY REPORTED AND CALLED VALUE.    Culture (A)  Final    STAPHYLOCOCCUS AUREUS SUSCEPTIBILITIES PERFORMED ON PREVIOUS CULTURE WITHIN THE LAST 5 DAYS. Performed at [facility], [address], [place], [place] [address]    Report Status 10/27/2023 FINAL  Final  Culture, blood (Routine X 2) w Reflex to ID Panel     Status: Abnormal   Collection Time: 10/26/23  1:54 PM   Specimen: BLOOD RIGHT ARM  Result Value Ref Range Status   Specimen Description   Final    BLOOD RIGHT ARM Performed at [facility], [address] [place], [place] [address]    Special Requests   Final    BOTTLES DRAWN AEROBIC AND ANAEROBIC Blood Culture adequate volume Performed at [facility], [address] [place], [place] [address]    Culture  Setup Time   Final    GRAM POSITIVE COCCI ANAEROBIC BOTTLE ONLY CRITICAL VALUE NOTED.  VALUE IS CONSISTENT WITH PREVIOUSLY REPORTED AND CALLED VALUE. GRAM STAIN REVIEWED-AGREE WITH RESULT DRT  Culture (A)  Final    STAPHYLOCOCCUS AUREUS SUSCEPTIBILITIES PERFORMED ON PREVIOUS CULTURE WITHIN THE LAST 5 DAYS. Performed at [facility], [address], [place], [place] [address]    Report Status 10/29/2023 FINAL  Final  Culture, blood (Routine X 2) w Reflex to ID Panel     Status: Abnormal   Collection Time: 10/26/23  2:00 PM   Specimen: BLOOD RIGHT FOREARM  Result Value Ref Range Status    Specimen Description   Final    BLOOD RIGHT FOREARM Performed at [facility], [address], [place], [place] [address]    Special Requests   Final    BOTTLES DRAWN AEROBIC AND ANAEROBIC Blood Culture results may not be optimal due to an inadequate volume of blood received in culture bottles Performed at [facility], [address] [place], [place] [address]    Culture  Setup Time   Final    GRAM POSITIVE COCCI IN BOTH AEROBIC AND ANAEROBIC BOTTLES GRAM STAIN REVIEWED-AGREE WITH RESULT DRT    Culture (A)  Final    STAPHYLOCOCCUS AUREUS SUSCEPTIBILITIES PERFORMED ON PREVIOUS CULTURE WITHIN THE LAST 5 DAYS. Performed at [facility], [address] [place], [place] [address]    Report Status 10/29/2023 FINAL  Final  Aerobic/Anaerobic Culture w Gram Stain (surgical/deep wound)     Status: None   Collection Time: 10/27/23  1:18 PM   Specimen: Joint, Other; Body Fluid  Result Value Ref Range Status   Specimen Description   Final    FLUID Performed at [facility], [address] [place], [place] [address]    Special Requests   Final     JOINT OTHER LEFT FOOT SEPTIC ARTHRITIS Performed at [facility], [address] [place], [place] [address]    Gram Stain NO WBC SEEN NO ORGANISMS SEEN   Final   Culture   Final    No growth aerobically or anaerobically. Performed at [facility], [address], [place], [place] [address]    Report Status 11/01/2023 FINAL  Final  Aerobic/Anaerobic Culture w Gram Stain (surgical/deep wound)     Status: None   Collection Time: 10/27/23  1:45 PM   Specimen: Bone; Tissue  Result Value Ref Range Status   Specimen Description   Final    TISSUE Performed at [facility], [address], [place], [place] [address]    Special Requests   Final     BONE LEFT FOOT SEPTIC ARTHRITIS Performed at [facility], [address] [place], [place] [address]    Gram Stain NO WBC  SEEN NO ORGANISMS SEEN   Final   Culture   Final    No growth aerobically or anaerobically. Performed at [facility], [address] [place], [place] [address]    Report Status 11/01/2023 FINAL  Final  Aerobic/Anaerobic Culture w Gram Stain (surgical/deep wound)     Status: None   Collection Time: 10/27/23  2:00 PM   Specimen: Synovium; Body Fluid  Result Value Ref Range Status   Specimen Description   Final    SYNOVIAL FLUID Performed at [facility], [address], [place], [place] [address]    Special Requests   Final    NONE Performed at [facility], [address] [place], [place] [address]    Gram Stain   Final    ABUNDANT WBC PRESENT, PREDOMINANTLY PMN NO ORGANISMS SEEN  Culture   Final    No growth aerobically or anaerobically. Performed at [facility], [address], [place], [place] [address]    Report Status 11/01/2023 FINAL  Final  Surgical pcr screen     Status: Abnormal   Collection Time: 10/29/23  6:10 PM   Specimen: Nasal Mucosa; Nasal Swab  Result Value Ref Range Status   MRSA, PCR POSITIVE (A) NEGATIVE Final    Comment: RESULT CALLED TO, READ BACK BY AND VERIFIED WITH: DRISS ZINE 10/29/23 2100 MU    Staphylococcus aureus POSITIVE (A) NEGATIVE Final    Comment: (NOTE) The Xpert SA Assay (FDA approved for NASAL specimens in patients 52 years of age and older), is one component of a comprehensive surveillance program. It is not intended to diagnose infection nor to guide or monitor treatment. Performed at [facility], [address] [place], [place] [address]   Culture, blood (Routine X 2) w Reflex to ID Panel     Status: None   Collection Time: 10/30/23  5:43 AM   Specimen: BLOOD RIGHT ARM  Result Value Ref Range Status   Specimen Description BLOOD RIGHT ARM  Final   Special Requests   Final    BOTTLES DRAWN AEROBIC AND ANAEROBIC Blood Culture results may not be optimal due to an inadequate volume of  blood received in culture bottles   Culture   Final    NO GROWTH 5 DAYS Performed at [facility], [address] [place], [place] [address]    Report Status 11/04/2023 FINAL  Final  Culture, blood (Routine X 2) w Reflex to ID Panel     Status: None   Collection Time: 10/30/23  5:48 AM   Specimen: BLOOD RIGHT HAND  Result Value Ref Range Status   Specimen Description BLOOD RIGHT HAND  Final   Special Requests   Final    BOTTLES DRAWN AEROBIC ONLY Blood Culture results may not be optimal due to an inadequate volume of blood received in culture bottles   Culture   Final    NO GROWTH 5 DAYS Performed at [facility], [address], [place], [place] [address]    Report Status 11/04/2023 FINAL  Final  Aerobic/Anaerobic Culture w Gram Stain (surgical/deep wound)     Status: None (Preliminary result)   Collection Time: 10/30/23  7:46 AM   Specimen: Bone; Tissue  Result Value Ref Range Status   Specimen Description   Final    BONE Performed at [facility], [address] [place], [place] [address]    Special Requests   Final    LEFT FIRST METATARSAL HEAD Performed at [facility], [address] [place], [place] [address]    Gram Stain NO WBC SEEN NO ORGANISMS SEEN   Final   Culture   Final    NO GROWTH 4 DAYS NO ANAEROBES ISOLATED; CULTURE IN PROGRESS FOR 5 DAYS Performed at [facility], [address], [place], [place] [address]    Report Status PENDING  Incomplete     Labs: CBC: Recent Labs  Lab 10/31/23 0547 11/01/23 0407 11/02/23 0353 11/03/23 0640 11/04/23 0432  WBC 10.4 10.6* 10.0 10.1 8.9  HGB 9.6* 8.7* 9.2* 9.1* 9.9*  HCT 29.2* 26.1* 27.3* 27.3* 30.3*  MCV 100.0 97.8 98.2 97.8 100.0  PLT 668* 697* 739* 758* 837*   Basic Metabolic Panel: Recent Labs  Lab 10/31/23 0547 11/01/23 0407 11/02/23 0353 11/03/23 0640 11/04/23 0432  NA  130* 130* 129* 134* 133*  K 4.4 4.7 4.6 4.8 5.1  CL 99 100 99 103 100  CO2  24 24 21* 23 24  GLUCOSE 287* 260* 181* 151* 122*  BUN 10 8 12 12 16   CREATININE 0.45 0.42* 0.50 0.47 0.64  CALCIUM 8.7* 8.7* 8.9 8.9 9.4  MG 2.0 2.1 2.1 2.1 2.1  PHOS 2.9 3.4 4.3 5.0* 5.6*   Liver Function Tests: No results for input(s): "AST", "ALT", "ALKPHOS", "BILITOT", "PROT", "ALBUMIN" in the last 168 hours. No results for input(s): "LIPASE", "AMYLASE" in the last 168 hours. No results for input(s): "AMMONIA" in the last 168 hours. Cardiac Enzymes: Recent Labs  Lab 11/03/23 0640  CKTOTAL 14*   BNP (last 3 results) No results for input(s): "BNP" in the last 8760 hours. CBG: Recent Labs  Lab 11/03/23 1112 11/03/23 1636 11/03/23 1946 11/04/23 0744 11/04/23 1209  GLUCAP 125* 198* 177* 113* 192*    Time spent: 35 minutes  Signed:  Gillis Santa  [facility] 11/04/2023 2:49 PM

## 2023-11-04 NOTE — TOC Progression Note (Signed)
Transition of Care [facility]) - Progression Note    Patient Details  Name: Danielle Harrison MRN: [id number] Date of Birth: October 18, 1975  Transition of Care [facility] CM/SW Contact  Chapman Fitch, RN Phone Number: 11/04/2023, 12:00 PM  Clinical Narrative:      Toc working on confirmation of benefits, IV antibiotics, and DME  Expected Discharge Plan: Home w Home Health Services    Expected Discharge Plan and Services                                               Social Determinants of Health (SDOH) Interventions SDOH Screenings   Food Insecurity: No Food Insecurity (10/21/2023)  Housing: High Risk (10/21/2023)  Transportation Needs: No Transportation Needs (10/21/2023)  Utilities: Not At Risk (10/21/2023)  Tobacco Use: Medium Risk (10/30/2023)    Readmission Risk Interventions     No data to display

## 2023-11-05 ENCOUNTER — Other Ambulatory Visit: Payer: Self-pay

## 2023-11-05 DIAGNOSIS — M86172 Other acute osteomyelitis, left ankle and foot: Secondary | ICD-10-CM | POA: Diagnosis not present

## 2023-11-10 ENCOUNTER — Telehealth: Payer: Self-pay | Admitting: Podiatry

## 2023-11-10 ENCOUNTER — Other Ambulatory Visit: Payer: Self-pay

## 2023-11-10 NOTE — Telephone Encounter (Signed)
Due to procedure on left foot, Patient is requesting medication, Patient is experiencing pain.

## 2023-11-11 ENCOUNTER — Other Ambulatory Visit: Payer: Self-pay | Admitting: Podiatry

## 2023-11-11 MED ORDER — OXYCODONE HCL 5 MG PO TABS: 5.0000 mg | ORAL_TABLET | Freq: Four times a day (QID) | ORAL | 0 refills | Status: DC | PRN

## 2023-11-13 ENCOUNTER — Ambulatory Visit: Payer: Medicaid Other | Admitting: Podiatry

## 2023-11-13 ENCOUNTER — Ambulatory Visit (INDEPENDENT_AMBULATORY_CARE_PROVIDER_SITE_OTHER): Payer: Medicaid Other

## 2023-11-13 DIAGNOSIS — Z9889 Other specified postprocedural states: Secondary | ICD-10-CM

## 2023-11-13 DIAGNOSIS — M86172 Other acute osteomyelitis, left ankle and foot: Secondary | ICD-10-CM | POA: Diagnosis not present

## 2023-11-13 MED ORDER — GABAPENTIN 100 MG PO CAPS: 100.0000 mg | ORAL_CAPSULE | Freq: Three times a day (TID) | ORAL | 3 refills | Status: DC

## 2023-11-13 NOTE — Progress Notes (Signed)
Subjective:  Patient ID: Danielle Harrison, female    DOB: August 19, 1976,  MRN: [id number]  Chief Complaint  Patient presents with   Routine Post Op    POV#1: LEFT FOOT FIRST METATARSAL PHALANGEAL JOINT RESECTION, ANTIBIOTIC SPACER, WASHOUT, WOUND CLOSURE    DOS: 10/30/2023 Procedure: Left first metatarsophalangeal joint resection with antibiotic spacer and wound closure  48 y.o. female returns for post-op check.  She states she is doing well minimal pain.  She is here as a regular postop visit the surgery was done by Dr. Annamary Rummage.  Denies any other acute complaints.  Bandages clean dry and intact  Review of Systems: Negative except as noted in the HPI. Denies N/V/F/Ch.  Past Medical History:  Diagnosis Date   Anxiety    Arthritis    back   Asthma    Neuromuscular disorder (HCC)    weakness and numbness    Restless leg syndrome     Current Outpatient Medications:    acetaminophen (TYLENOL) 325 MG tablet, Take 2 tablets (650 mg total) by mouth every 8 (eight) hours as needed for mild pain (pain score 1-3), moderate pain (pain score 4-6), fever or headache., Disp: 100 tablet, Rfl: 0   ascorbic acid (VITAMIN C) 500 MG tablet, Take 1 tablet (500 mg total) by mouth daily., Disp: 30 tablet, Rfl: 0   bisacodyl (DULCOLAX) 5 MG EC tablet, Take 2 tablets (10 mg total) by mouth at bedtime as needed for moderate constipation., Disp: 30 tablet, Rfl: 0   daptomycin (CUBICIN) IVPB, Inject 500 mg into the vein daily. Indication:  MRSA bacteremia, thoracic discitis, toe osteomyelitis First Dose: Yes Last Day of Therapy:  12/10/2023 Labs - Once weekly:  CBC/D, CMP, CPK, CRP, ESR Fax weekly lab results  promptly to [phone] Please pull PIC at completion of IV antibiotics Call [phone] with critical value or questions Method of administration: IV Push Method of administration may be changed at the discretion of home infusion pharmacist based upon assessment of the patient and/or caregiver's ability  to self-administer the medication ordered., Disp: 36 Units, Rfl: 0   gabapentin (NEURONTIN) 100 MG capsule, Take 1 capsule (100 mg total) by mouth 3 (three) times daily., Disp: 90 capsule, Rfl: 3   metFORMIN (GLUCOPHAGE) 500 MG tablet, Take 1 tablet (500 mg total) by mouth 2 (two) times daily with a meal., Disp: 60 tablet, Rfl: 5   metoprolol tartrate (LOPRESSOR) 100 MG tablet, Take 1 tablet (100 mg total) by mouth 2 (two) times daily., Disp: 60 tablet, Rfl: 5   Multiple Vitamin (MULTIVITAMIN WITH MINERALS) TABS tablet, Take 1 tablet by mouth daily., Disp: 30 tablet, Rfl: 0   oxyCODONE (OXY IR/ROXICODONE) 5 MG immediate release tablet, Take 1 tablet (5 mg total) by mouth every 6 (six) hours as needed for severe pain (pain score 7-10)., Disp: 15 tablet, Rfl: 0   polyethylene glycol powder (GLYCOLAX/MIRALAX) 17 GM/SCOOP powder, Take 17 g by mouth daily. Mix as directed., Disp: 238 g, Rfl: 0   predniSONE (STERAPRED UNI-PAK 21 TAB) 10 MG (21) TBPK tablet, Take by mouth daily. Take 6 tabs by mouth daily  for 1 days, then 5 tabs for 1 days, then 4 tabs for 1 days, then 3 tabs for 1 days, 2 tabs for 1 days, then 1 tab by mouth daily for 1 days, Disp: 21 tablet, Rfl: 0  Social History   Tobacco Use  Smoking Status Former   Current packs/day: 0.50   Average packs/day: 0.5 packs/day for 6.0 years (  3.0 ttl pk-yrs)   Types: Cigarettes  Smokeless Tobacco Never  Tobacco Comments   Quit 8/22    Allergies  Allergen Reactions   Diclofenac Hives   Objective:  There were no vitals filed for this visit. There is no height or weight on file to calculate BMI. Constitutional Well developed. Well nourished.  Vascular Foot warm and well perfused. Capillary refill normal to all digits.   Neurologic Normal speech. Oriented to person, place, and time. Epicritic sensation to light touch grossly present bilaterally.  Dermatologic Skin healing well without signs of infection. Skin edges well coapted without  signs of infection.  Orthopedic: Tenderness to palpation noted about the surgical site.   Radiographs: 3 views of skeletally mature adult left foot: Antibiotic spacer noted.  No abnormalities identified.  Good position of the spacer noted. Assessment:   1. Post-operative state    Plan:  Patient was evaluated and treated and all questions answered.  S/p foot surgery left -Progressing as expected post-operatively. -XR: See above -WB Status: Partial weightbearing to the heel in left -Sutures: Intact.  No clinical signs of Deis is noted no complication noted. -Medications: None -Foot redressed.  No follow-ups on file.

## 2023-11-16 ENCOUNTER — Ambulatory Visit
Admission: EM | Admit: 2023-11-16 | Discharge: 2023-11-16 | Disposition: A | Discharge status: Home / Self Care | Payer: Medicaid Other | Attending: Emergency Medicine | Admitting: Emergency Medicine

## 2023-11-16 ENCOUNTER — Encounter: Payer: Self-pay | Admitting: Emergency Medicine

## 2023-11-16 DIAGNOSIS — M86172 Other acute osteomyelitis, left ankle and foot: Secondary | ICD-10-CM | POA: Diagnosis not present

## 2023-11-16 DIAGNOSIS — R0781 Pleurodynia: Secondary | ICD-10-CM

## 2023-11-16 DIAGNOSIS — M546 Pain in thoracic spine: Secondary | ICD-10-CM | POA: Diagnosis not present

## 2023-11-16 MED ORDER — PREDNISONE 10 MG (21) PO TBPK: ORAL_TABLET | Freq: Every day | ORAL | 0 refills | Status: DC

## 2023-11-16 NOTE — Discharge Instructions (Signed)
Your pain is most likely caused by irritation to the muscles.  Prednisone every morning with food as directed to reduce internal inflammation and help with pain, may take Tylenol or any topical medications in addition to this  Chest wall has been wrapped with compression to add stability and support, use as needed  You may use heating pad in 15 minute intervals as needed for additional comfort, \or you may find comfort in using ice in 10-15 minutes over affected area  Begin massaging and stretching affected area daily for 10 minutes as tolerated to further loosen muscles   When sitting and lying down place pillow around chest and back   If pain persist after recommended treatment or reoccurs if may be beneficial to follow up with orthopedic specialist for evaluation, this doctor specializes in the bones and can manage your symptoms long-term with options such as but not limited to imaging, medications or physical therapy

## 2023-11-16 NOTE — ED Provider Notes (Signed)
Renaldo Fiddler    CSN: [id number] Arrival date & time: 11/16/23  1416      History   Chief Complaint Chief Complaint  Patient presents with   Spasms    HPI Danielle Harrison is a 48 y.o. female.   Presents for evaluation of pain present underneath the breast wrapping around to the bilateral back present for 9 days.  Described as a spasming and cramping similar to a charley horse.  Endorses symptoms started while she was hospitalized for 2 weeks, relating it took constantly lying in the bed.  Exacerbated by movement such as twisting and turning.  Denies numbness, tingling or injury.  Denies shortness of breath or wheezing, cough.   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   Acute osteomyelitis of thoracic spine (HCC) 10/28/2023   Acute hematogenous osteomyelitis of left foot (HCC) 10/27/2023   Abscess of left foot 10/27/2023   MRSA bacteremia 10/27/2023   Septic arthritis of left foot (HCC) 10/26/2023   Small bowel intussusception (HCC) 10/19/2023   Pancreatitis 10/19/2023   Abdominal pain 01/29/2022   Acute pancreatitis 02/27/2021   Chronic pain 02/27/2021   Asthma 02/27/2021   Stomach ache    Diarrhea    Stomach irritation     Past Surgical History:  Procedure Laterality Date   BIOPSY  06/28/2019   Procedure: BIOPSY;  Surgeon: Pasty Spillers, MD;  Location: [facility];  Service: Endoscopy;;   CESAREAN SECTION     x2   COLONOSCOPY WITH PROPOFOL N/A 06/28/2019   Procedure: COLONOSCOPY WITH PROPOFOL;  Surgeon: Pasty Spillers, MD;  Location: [facility];  Service: Endoscopy;  Laterality: N/A;   ESOPHAGOGASTRODUODENOSCOPY (EGD) WITH PROPOFOL N/A 06/28/2019    Procedure: ESOPHAGOGASTRODUODENOSCOPY (EGD) WITH PROPOFOL;  Surgeon: Pasty Spillers, MD;  Location: [facility];  Service: Endoscopy;  Laterality: N/A;   INCISION AND DRAINAGE Left 10/27/2023   Procedure: INCISION AND DRAINAGE LEFT FOOT;  Surgeon: Pilar Plate, DPM;  Location: [facility];  Service: Orthopedics/Podiatry;  Laterality: Left;   INCISION AND DRAINAGE Left 10/30/2023   Procedure: LEFT FOOT FIRST METATARSAL PHALANGEAL JOINT RESECTION, ANTIBIOTIC SPACER, WASHOUT, WOUND CLOSURE;  Surgeon: Pilar Plate, DPM;  Location: [facility];  Service: Orthopedics/Podiatry;  Laterality: Left;   TEE WITHOUT CARDIOVERSION N/A 10/29/2023   Procedure: TRANSESOPHAGEAL ECHOCARDIOGRAM (TEE);  Surgeon: Antonieta Iba, MD;  Location: [facility];  Service: Cardiovascular;  Laterality: N/A;   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   Procedure: LEFT FOOT 1ST MPJ RESECTION;  Surgeon: Pilar Plate, DPM;  Location: [facility];  Service: Orthopedics/Podiatry;  Laterality: Left;   TRANSMETATARSAL AMPUTATION Left 10/30/2023   Procedure: LEFT FOOT 1ST MPJ RESECTION;  Surgeon: Pilar Plate, DPM;  Location: [facility];  Service: Orthopedics/Podiatry;  Laterality: Left;    OB History     Gravida  2   Para  2   Term  2   Preterm      AB      Living  2      SAB      IAB      Ectopic      Multiple  Live Births               Home Medications    Prior to Admission medications   Medication Sig Start Date End Date Taking? Authorizing Provider  predniSONE (STERAPRED UNI-PAK 21 TAB) 10 MG (21) TBPK tablet Take by mouth daily. Take 6 tabs by mouth daily  for 1 days, then 5 tabs for 1 days, then 4 tabs for 1 days, then 3 tabs for 1 days, 2 tabs for 1 days, then 1 tab by mouth daily for 1 days 11/16/23  Yes Aneudy Champlain, Elita Boone, NP  acetaminophen (TYLENOL) 325 MG tablet Take 2 tablets (650 mg total) by mouth every 8 (eight) hours as needed for mild pain  (pain score 1-3), moderate pain (pain score 4-6), fever or headache. 11/04/23   Gillis Santa, MD  ascorbic acid (VITAMIN C) 500 MG tablet Take 1 tablet (500 mg total) by mouth daily. 11/04/23 12/04/23  Gillis Santa, MD  bisacodyl (DULCOLAX) 5 MG EC tablet Take 2 tablets (10 mg total) by mouth at bedtime as needed for moderate constipation. 11/04/23   Gillis Santa, MD  daptomycin (CUBICIN) IVPB Inject 500 mg into the vein daily. Indication:  MRSA bacteremia, thoracic discitis, toe osteomyelitis First Dose: Yes Last Day of Therapy:  12/10/2023 Labs - Once weekly:  CBC/D, CMP, CPK, CRP, ESR Fax weekly lab results  promptly to [phone] Please pull PIC at completion of IV antibiotics Call [phone] with critical value or questions Method of administration: IV Push Method of administration may be changed at the discretion of home infusion pharmacist based upon assessment of the patient and/or caregiver's ability to self-administer the medication ordered. 11/05/23 12/10/23  Gillis Santa, MD  gabapentin (NEURONTIN) 100 MG capsule Take 1 capsule (100 mg total) by mouth 3 (three) times daily. 11/13/23   Candelaria Stagers, DPM  metFORMIN (GLUCOPHAGE) 500 MG tablet Take 1 tablet (500 mg total) by mouth 2 (two) times daily with a meal. 11/04/23 05/02/24  Gillis Santa, MD  metoprolol tartrate (LOPRESSOR) 100 MG tablet Take 1 tablet (100 mg total) by mouth 2 (two) times daily. 11/04/23 05/02/24  Gillis Santa, MD  Multiple Vitamin (MULTIVITAMIN WITH MINERALS) TABS tablet Take 1 tablet by mouth daily. 11/05/23   Gillis Santa, MD  oxyCODONE (OXY IR/ROXICODONE) 5 MG immediate release tablet Take 1 tablet (5 mg total) by mouth every 6 (six) hours as needed for severe pain (pain score 7-10). 11/11/23   Standiford, [name], DPM  polyethylene glycol powder (GLYCOLAX/MIRALAX) 17 GM/SCOOP powder Take 17 g by mouth daily. Mix as directed. 11/04/23   Gillis Santa, MD    Family History Family History  Problem Relation  Age of Onset   Colon cancer Neg Hx     Social History Social History   Tobacco Use   Smoking status: Former    Current packs/day: 0.50    Average packs/day: 0.5 packs/day for 6.0 years (3.0 ttl pk-yrs)    Types: Cigarettes   Smokeless tobacco: Never   Tobacco comments:    Quit 8/22  Vaping Use   Vaping status: Never Used  Substance Use Topics   Alcohol use: Yes    Comment: 3 times per week   Drug use: Never     Allergies   Diclofenac   Review of Systems Review of Systems   Physical Exam Triage Vital Signs ED Triage Vitals  Encounter Vitals Group     BP 11/16/23 1439 119/83     Systolic BP Percentile --  Diastolic BP Percentile --      Pulse Rate 11/16/23 1439 90     Resp 11/16/23 1439 18     Temp 11/16/23 1439 98.2 F (36.8 C)     Temp Source 11/16/23 1439 Oral     SpO2 11/16/23 1439 96 %     Weight --      Height --      Head Circumference --      Peak Flow --      Pain Score 11/16/23 1437 10     Pain Loc --      Pain Education --      Exclude from Growth Chart --    No data found.  Updated Vital Signs BP 119/83 (BP Location: Left Arm)   Pulse 90   Temp 98.2 F (36.8 C) (Oral)   Resp 18   LMP 06/01/2022 (Exact Date)   SpO2 96%   Visual Acuity Right Eye Distance:   Left Eye Distance:   Bilateral Distance:    Right Eye Near:   Left Eye Near:    Bilateral Near:     Physical Exam Constitutional:      Appearance: Normal appearance.  HENT:     Head: Normocephalic.  Eyes:     Extraocular Movements: Extraocular movements intact.  Chest:     Comments: Tenderness present over the right ribs to the anterior flank, lying over ribs 4 through 8, no ecchymosis or deformity noted, chest wall symmetrical Musculoskeletal:     Comments: Newness present to the thoracic region without spinal tenderness, no ecchymosis swelling or deformity, able to sit erect without complication, able to twist turn and bend but pain is elicited with movement   Neurological:     Mental Status: She is alert and oriented to person, place, and time. Mental status is at baseline.      UC Treatments / Results  Labs (all labs ordered are listed, but only abnormal results are displayed) Labs Reviewed - No data to display  EKG   Radiology No results found.  Procedures Procedures (including critical care time)  Medications Ordered in UC Medications - No data to display  Initial Impression / Assessment and Plan / UC Course  I have reviewed the triage vital signs and the nursing notes.  Pertinent labs & imaging results that were available during my care of the patient were reviewed by me and considered in my medical decision making (see chart for details).  Acute bilateral thoracic back pain, right rib pain  Denies injury therefore deferring imaging, etiology muscular, reproducible on exam, lungs are clear to auscultation and vital signs are stable, stable for outpatient management, prescribed prednisone and given a compression wrap recommended supportive care through heat, ice massage stretching and pillows for support recommended PCP follow-up if symptoms persist past use of medicine Final Clinical Impressions(s) / UC Diagnoses   Final diagnoses:  Rib pain on right side  Acute bilateral thoracic back pain     Discharge Instructions      Your pain is most likely caused by irritation to the muscles.  Prednisone every morning with food as directed to reduce internal inflammation and help with pain, may take Tylenol or any topical medications in addition to this  Chest wall has been wrapped with compression to add stability and support, use as needed  You may use heating pad in 15 minute intervals as needed for additional comfort, \or you may find comfort in using ice in 10-15 minutes over  affected area  Begin massaging and stretching affected area daily for 10 minutes as tolerated to further loosen muscles   When sitting and lying  down place pillow around chest and back   If pain persist after recommended treatment or reoccurs if may be beneficial to follow up with orthopedic specialist for evaluation, this doctor specializes in the bones and can manage your symptoms long-term with options such as but not limited to imaging, medications or physical therapy      ED Prescriptions     Medication Sig Dispense Auth. Provider   predniSONE (STERAPRED UNI-PAK 21 TAB) 10 MG (21) TBPK tablet Take by mouth daily. Take 6 tabs by mouth daily  for 1 days, then 5 tabs for 1 days, then 4 tabs for 1 days, then 3 tabs for 1 days, 2 tabs for 1 days, then 1 tab by mouth daily for 1 days 21 tablet Marlys Stegmaier, Elita Boone, NP      PDMP not reviewed this encounter.   Valinda Hoar, [place] 11/16/23 [phone]

## 2023-11-16 NOTE — ED Triage Notes (Signed)
Pt c/o of muscle spasms in her chest for 9 days. She was in the hospital for an infection and had these sam symptoms in the hospital, but was treated with pain medication

## 2023-11-20 DIAGNOSIS — M86172 Other acute osteomyelitis, left ankle and foot: Secondary | ICD-10-CM | POA: Diagnosis not present

## 2023-11-21 ENCOUNTER — Ambulatory Visit: Payer: Medicaid Other | Admitting: Cardiology

## 2023-11-21 ENCOUNTER — Ambulatory Visit
Admission: RE | Admit: 2023-11-21 | Discharge: 2023-11-21 | Disposition: A | Discharge status: Home / Self Care | Payer: Medicaid Other | Source: Ambulatory Visit | Attending: Cardiology | Admitting: Cardiology

## 2023-11-21 ENCOUNTER — Encounter: Payer: Self-pay | Admitting: Cardiology

## 2023-11-21 ENCOUNTER — Ambulatory Visit
Admission: RE | Admit: 2023-11-21 | Discharge: 2023-11-21 | Disposition: A | Discharge status: Home / Self Care | Payer: Medicaid Other | Attending: Cardiology | Admitting: Cardiology

## 2023-11-21 VITALS — BP 108/58 | HR 100 | Ht 60.0 in | Wt 110.0 lb

## 2023-11-21 DIAGNOSIS — R7303 Prediabetes: Secondary | ICD-10-CM

## 2023-11-21 DIAGNOSIS — R1010 Upper abdominal pain, unspecified: Secondary | ICD-10-CM

## 2023-11-21 DIAGNOSIS — M549 Dorsalgia, unspecified: Secondary | ICD-10-CM | POA: Insufficient documentation

## 2023-11-21 DIAGNOSIS — K859 Acute pancreatitis without necrosis or infection, unspecified: Secondary | ICD-10-CM

## 2023-11-21 DIAGNOSIS — M4804 Spinal stenosis, thoracic region: Secondary | ICD-10-CM | POA: Diagnosis not present

## 2023-11-21 DIAGNOSIS — M4854XA Collapsed vertebra, not elsewhere classified, thoracic region, initial encounter for fracture: Secondary | ICD-10-CM | POA: Diagnosis not present

## 2023-11-21 DIAGNOSIS — M4316 Spondylolisthesis, lumbar region: Secondary | ICD-10-CM | POA: Diagnosis not present

## 2023-11-21 DIAGNOSIS — Z0130 Encounter for examination of blood pressure without abnormal findings: Secondary | ICD-10-CM

## 2023-11-21 MED ORDER — BACLOFEN 10 MG PO TABS: 10.0000 mg | ORAL_TABLET | Freq: Every day | ORAL | 1 refills | Status: DC

## 2023-11-21 NOTE — Progress Notes (Signed)
 Established Patient Office Visit  Subjective:  Patient ID: Danielle Harrison, female    DOB: Aug 05, 1976  Age: 48 y.o. MRN: [id number]  Chief Complaint  Patient presents with   Hospitalization Follow-up    Hospital Follow Up    Patient in office for hospital follow up. Patient admitted on 10/18/23 for abdominal pain, nausea and vomiting. Patient diagnosed with acute pancreatitis, small bowel intussusception, MRSA bacteremia, acute osteomyelitis of thoracic spine, acute bacterial endocarditis and acute osteomyelitis of left foot. Patient on antibiotics for 6 weeks, following with ID.  Patient presented to urgent care on 11/16/23 complaining of intermittent back pain, chest pain. Patient states oxycodone , ibuprofen, and muscle relaxer are not helping. No imaging was done at urgent care. Will order an xray, if normal, will send for CT scan. Patient taking tizanidine  now, will send in Baclofen  to see if it helps.     No other concerns at this time.   Past Medical History:  Diagnosis Date   Anxiety    Arthritis    back   Asthma    Neuromuscular disorder (HCC)    weakness and numbness    Restless leg syndrome     Past Surgical History:  Procedure Laterality Date   BIOPSY  06/28/2019   Procedure: BIOPSY;  Surgeon: Janalyn Keene NOVAK, MD;  Location: [facility];  Service: Endoscopy;;   CESAREAN SECTION     x2   COLONOSCOPY WITH PROPOFOL  N/A 06/28/2019   Procedure: COLONOSCOPY WITH PROPOFOL ;  Surgeon: Janalyn Keene NOVAK, MD;  Location: [facility];  Service: Endoscopy;  Laterality: N/A;   ESOPHAGOGASTRODUODENOSCOPY (EGD) WITH PROPOFOL  N/A 06/28/2019   Procedure: ESOPHAGOGASTRODUODENOSCOPY (EGD) WITH PROPOFOL ;  Surgeon: Janalyn Keene NOVAK, MD;  Location: [facility];  Service: Endoscopy;  Laterality: N/A;   INCISION AND DRAINAGE Left 10/27/2023   Procedure: INCISION AND DRAINAGE LEFT FOOT;  Surgeon: Malvin Marsa FALCON, DPM;  Location: [facility];  Service:  Orthopedics/Podiatry;  Laterality: Left;   INCISION AND DRAINAGE Left 10/30/2023   Procedure: LEFT FOOT FIRST METATARSAL PHALANGEAL JOINT RESECTION, ANTIBIOTIC SPACER, WASHOUT, WOUND CLOSURE;  Surgeon: Malvin Marsa FALCON, DPM;  Location: [facility];  Service: Orthopedics/Podiatry;  Laterality: Left;   TEE WITHOUT CARDIOVERSION N/A 10/29/2023   Procedure: TRANSESOPHAGEAL ECHOCARDIOGRAM (TEE);  Surgeon: Perla Evalene PARAS, MD;  Location: [facility];  Service: Cardiovascular;  Laterality: N/A;   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   Procedure: LEFT FOOT 1ST MPJ RESECTION;  Surgeon: Malvin Marsa FALCON, DPM;  Location: [facility];  Service: Orthopedics/Podiatry;  Laterality: Left;   TRANSMETATARSAL AMPUTATION Left 10/30/2023   Procedure: LEFT FOOT 1ST MPJ RESECTION;  Surgeon: Malvin Marsa FALCON, DPM;  Location: [facility];  Service: Orthopedics/Podiatry;  Laterality: Left;    Social History   Socioeconomic History   Marital status: Married    Spouse name: Not on file   Number of children: Not on file   Years of education: Not on file   Highest education level: Not on file  Occupational History   Not on file  Tobacco Use   Smoking status: Former    Current packs/day: 0.50    Average packs/day: 0.5 packs/day for 6.0 years (3.0 ttl pk-yrs)    Types: Cigarettes   Smokeless tobacco: Never   Tobacco comments:    Quit 8/22  Vaping Use   Vaping status: Never Used  Substance and Sexual Activity   Alcohol use: Yes    Comment: 3 times per week   Drug use: Never   Sexual  activity: Not on file  Other Topics Concern   Not on file  Social History Narrative   Not on file   Social Drivers of Health   Financial Resource Strain: Not on file  Food Insecurity: No Food Insecurity (10/21/2023)   Hunger Vital Sign    Worried About Running Out of Food in the Last Year: Never true    Ran Out of Food in the Last Year: Never true  Transportation Needs: No Transportation Needs (10/21/2023)    PRAPARE - Administrator, Civil Service (Medical): No    Lack of Transportation (Non-Medical): No  Physical Activity: Not on file  Stress: Not on file  Social Connections: Not on file  Intimate Partner Violence: Not At Risk (10/21/2023)   Humiliation, Afraid, Rape, and Kick questionnaire    Fear of Current or Ex-Partner: No    Emotionally Abused: No    Physically Abused: No    Sexually Abused: No    Family History  Problem Relation Age of Onset   Colon cancer Neg Hx     Allergies  Allergen Reactions   Diclofenac Hives    Outpatient Medications Prior to Visit  Medication Sig   acetaminophen  (TYLENOL ) 325 MG tablet Take 2 tablets (650 mg total) by mouth every 8 (eight) hours as needed for mild pain (pain score 1-3), moderate pain (pain score 4-6), fever or headache.   ascorbic acid  (VITAMIN C ) 500 MG tablet Take 1 tablet (500 mg total) by mouth daily.   bisacodyl  (DULCOLAX) 5 MG EC tablet Take 2 tablets (10 mg total) by mouth at bedtime as needed for moderate constipation.   daptomycin  (CUBICIN ) IVPB Inject 500 mg into the vein daily. Indication:  MRSA bacteremia, thoracic discitis, toe osteomyelitis First Dose: Yes Last Day of Therapy:  12/10/2023 Labs - Once weekly:  CBC/D, CMP, CPK, CRP, ESR Fax weekly lab results  promptly to [phone] Please pull PIC at completion of IV antibiotics Call [phone] with critical value or questions Method of administration: IV Push Method of administration may be changed at the discretion of home infusion pharmacist based upon assessment of the patient and/or caregiver's ability to self-administer the medication ordered.   gabapentin  (NEURONTIN ) 100 MG capsule Take 1 capsule (100 mg total) by mouth 3 (three) times daily.   metFORMIN  (GLUCOPHAGE ) 500 MG tablet Take 1 tablet (500 mg total) by mouth 2 (two) times daily with a meal.   metoprolol  tartrate (LOPRESSOR ) 100 MG tablet Take 1 tablet (100 mg total) by mouth 2 (two) times  daily.   Multiple Vitamin (MULTIVITAMIN WITH MINERALS) TABS tablet Take 1 tablet by mouth daily.   oxyCODONE  (OXY IR/ROXICODONE ) 5 MG immediate release tablet Take 1 tablet (5 mg total) by mouth every 6 (six) hours as needed for severe pain (pain score 7-10).   polyethylene glycol powder (GLYCOLAX /MIRALAX ) 17 GM/SCOOP powder Take 17 g by mouth daily. Mix as directed.   predniSONE  (STERAPRED UNI-PAK 21 TAB) 10 MG (21) TBPK tablet Take by mouth daily. Take 6 tabs by mouth daily  for 1 days, then 5 tabs for 1 days, then 4 tabs for 1 days, then 3 tabs for 1 days, 2 tabs for 1 days, then 1 tab by mouth daily for 1 days   No facility-administered medications prior to visit.    Review of Systems  Constitutional: Negative.   HENT: Negative.    Eyes: Negative.   Respiratory: Negative.  Negative for shortness of breath.   Cardiovascular: Negative.  Negative for chest pain.  Gastrointestinal: Negative.  Negative for abdominal pain, constipation and diarrhea.  Genitourinary: Negative.   Musculoskeletal:  Negative for joint pain and myalgias.  Skin: Negative.   Neurological: Negative.  Negative for dizziness and headaches.  Endo/Heme/Allergies: Negative.   All other systems reviewed and are negative.      Objective:   BP (!) 108/58   Pulse 100   Ht 5' (1.524 m)   Wt 110 lb (49.9 kg)   LMP 06/01/2022 (Exact Date)   SpO2 97%   BMI 21.48 kg/m   Vitals:   11/21/23 1039 11/21/23 1108  BP: (!) 108/58   Pulse: (!) 105 100  Height: 5' (1.524 m)   Weight: 110 lb (49.9 kg)   SpO2: 97%   BMI (Calculated): 21.48     Physical Exam Vitals and nursing note reviewed.  Constitutional:      Appearance: Normal appearance. She is normal weight.  HENT:     Head: Normocephalic and atraumatic.     Nose: Nose normal.     Mouth/Throat:     Mouth: Mucous membranes are moist.  Eyes:     Extraocular Movements: Extraocular movements intact.     Conjunctiva/sclera: Conjunctivae normal.     Pupils:  Pupils are equal, round, and reactive to light.  Cardiovascular:     Rate and Rhythm: Normal rate and regular rhythm.     Pulses: Normal pulses.     Heart sounds: Normal heart sounds.  Pulmonary:     Effort: Pulmonary effort is normal.     Breath sounds: Normal breath sounds.  Abdominal:     General: Abdomen is flat. Bowel sounds are normal.     Palpations: Abdomen is soft.  Musculoskeletal:        General: Normal range of motion.     Cervical back: Normal range of motion.  Skin:    General: Skin is warm and dry.  Neurological:     General: No focal deficit present.     Mental Status: She is alert and oriented to person, place, and time.  Psychiatric:        Mood and Affect: Mood normal.        Behavior: Behavior normal.        Thought Content: Thought content normal.        Judgment: Judgment normal.      No results found for any visits on 11/21/23.  Recent Results (from the past 2160 hours)  Lipase, blood     Status: Abnormal   Collection Time: 10/18/23  1:04 PM  Result Value Ref Range   Lipase 554 (H) 11 - 51 U/L    Comment: RESULTS CONFIRMED BY MANUAL DILUTION Performed at [facility], [address] [place], [place] [address]   Comprehensive metabolic panel     Status: Abnormal   Collection Time: 10/18/23  1:04 PM  Result Value Ref Range   Sodium 123 (L) 135 - 145 mmol/L   Potassium 3.4 (L) 3.5 - 5.1 mmol/L   Chloride 87 (L) 98 - 111 mmol/L   CO2 17 (L) 22 - 32 mmol/L   Glucose, Bld 299 (H) 70 - 99 mg/dL    Comment: Glucose reference range applies only to samples taken after fasting for at least 8 hours.   BUN 33 (H) 6 - 20 mg/dL   Creatinine, Ser 9.01 0.44 - 1.00 mg/dL   Calcium 89.7 8.9 - 89.6 mg/dL   Total Protein 8.9 (H) 6.5 -  8.1 g/dL   Albumin 4.9 3.5 - 5.0 g/dL   AST 37 15 - 41 U/L   ALT 16 0 - 44 U/L   Alkaline Phosphatase 93 38 - 126 U/L   Total Bilirubin 1.9 (H) 0.0 - 1.2 mg/dL   GFR, Estimated >39 >39 mL/min    Comment:  (NOTE) Calculated using the CKD-EPI Creatinine Equation (2021)    Anion gap 19 (H) 5 - 15    Comment: Performed at [facility], [address] [place], [place] [address]  CBC     Status: Abnormal   Collection Time: 10/18/23  1:04 PM  Result Value Ref Range   WBC 19.5 (H) 4.0 - 10.5 K/uL   RBC 4.63 3.87 - 5.11 MIL/uL   Hemoglobin 16.1 (H) 12.0 - 15.0 g/dL   HCT 55.6 63.9 - 53.9 %   MCV 95.7 80.0 - 100.0 fL   MCH 34.8 (H) 26.0 - 34.0 pg   MCHC 36.3 (H) 30.0 - 36.0 g/dL   RDW 88.3 88.4 - 84.4 %   Platelets 401 (H) 150 - 400 K/uL   nRBC 0.0 0.0 - 0.2 %    Comment: Performed at [facility], [address] [place], [place] [address]  Urinalysis, Routine w reflex microscopic -Urine, Clean Catch     Status: Abnormal   Collection Time: 10/18/23  4:17 PM  Result Value Ref Range   Color, Urine Tyreece Gelles (A) YELLOW    Comment: BIOCHEMICALS MAY BE AFFECTED BY COLOR   APPearance TURBID (A) CLEAR   Specific Gravity, Urine 1.032 (H) 1.005 - 1.030   pH 5.0 5.0 - 8.0   Glucose, UA 150 (A) NEGATIVE mg/dL   Hgb urine dipstick MODERATE (A) NEGATIVE   Bilirubin Urine SMALL (A) NEGATIVE   Ketones, ur 5 (A) NEGATIVE mg/dL   Protein, ur >=699 (A) NEGATIVE mg/dL   Nitrite NEGATIVE NEGATIVE   Leukocytes,Ua NEGATIVE NEGATIVE   RBC / HPF 11-20 0 - 5 RBC/hpf   WBC, UA 11-20 0 - 5 WBC/hpf   Bacteria, UA FEW (A) NONE SEEN   Squamous Epithelial / HPF >50 0 - 5 /HPF   Mucus PRESENT    Hyaline Casts, UA PRESENT     Comment: Performed at [facility], [address] [place], [place] [address]  POC urine preg, ED     Status: None   Collection Time: 10/18/23  4:21 PM  Result Value Ref Range   Preg Test, Ur NEGATIVE NEGATIVE    Comment:        THE SENSITIVITY OF THIS METHODOLOGY IS >24 mIU/mL   Lactic acid, plasma     Status: None   Collection Time: 10/18/23  6:27 PM  Result Value Ref Range   Lactic Acid, Venous 1.2 0.5 - 1.9 mmol/L    Comment: Performed at [facility], [address] [place], [place] [address]  Blood gas, venous     Status: Abnormal   Collection Time: 10/18/23  6:27 PM  Result Value Ref Range   pH, Ven 7.39 7.25 - 7.43   pCO2, Ven 35 (L) 44 - 60 mmHg   pO2, Ven 69 (H) 32 - 45 mmHg   Bicarbonate 21.2 20.0 - 28.0 mmol/L   Acid-base deficit 3.1 (H) 0.0 - 2.0 mmol/L   O2 Saturation 96 %   Patient temperature 37.0    Collection site VENOUS     Comment: Performed at [facility], [address] [place], [place] [address]  Phosphorus     Status: None   Collection Time: 10/18/23  6:27 PM  Result Value Ref Range   Phosphorus 3.6 2.5 - 4.6 mg/dL    Comment: Performed at [facility], [address] [place], [place] [address]  Osmolality     Status: None   Collection Time: 10/18/23  6:27 PM  Result Value Ref Range   Osmolality 287 275 - 295 mOsm/kg    Comment: REPEATED TO VERIFY Performed at [facility], [address] [place], [place] [address]   TSH     Status: None   Collection Time: 10/18/23  6:27 PM  Result Value Ref Range   TSH 1.979 0.350 - 4.500 uIU/mL    Comment: Performed by a 3rd Generation assay with a functional sensitivity of <=0.01 uIU/mL. Performed at [facility], [address] [place], [place] [address]   HIV Antibody (routine testing w rflx)     Status: None   Collection Time: 10/18/23  6:27 PM  Result Value Ref Range   HIV Screen 4th Generation wRfx Non Reactive Non Reactive    Comment: Performed at [facility], [address], [place], [place] [address]  Hemoglobin A1c     Status: Abnormal   Collection Time: 10/18/23  6:27 PM  Result Value Ref Range   Hgb A1c MFr Bld 6.3 (H) 4.8 - 5.6 %    Comment: (NOTE)         Prediabetes: 5.7 - 6.4         Diabetes: >6.4         Glycemic control for adults with diabetes: <7.0    Mean Plasma Glucose 134 mg/dL    Comment: (NOTE) Performed At: [facility] [address] [place], [place]  [id number] Jennette Shorter MD Ey:[phone]   CBC     Status: Abnormal   Collection Time: 10/18/23  6:27 PM  Result Value Ref Range   WBC 15.9 (H) 4.0 - 10.5 K/uL   RBC 4.38 3.87 - 5.11 MIL/uL   Hemoglobin 15.1 (H) 12.0 - 15.0 g/dL   HCT 58.4 63.9 - 53.9 %   MCV 94.7 80.0 - 100.0 fL   MCH 34.5 (H) 26.0 - 34.0 pg   MCHC 36.4 (H) 30.0 - 36.0 g/dL   RDW 88.4 88.4 - 84.4 %   Platelets 287 150 - 400 K/uL   nRBC 0.0 0.0 - 0.2 %    Comment: Performed at [facility], [address] [place], [place] [address]  Creatinine, serum     Status: None   Collection Time: 10/18/23  6:27 PM  Result Value Ref Range   Creatinine, Ser 0.72 0.44 - 1.00 mg/dL   GFR, Estimated >39 >39 mL/min    Comment: (NOTE) Calculated using the CKD-EPI Creatinine Equation (2021) Performed at [facility], [address] [place], [place] [address]   Lactic acid, plasma     Status: Abnormal   Collection Time: 10/18/23 10:16 PM  Result Value Ref Range   Lactic Acid, Venous 2.4 (HH) 0.5 - 1.9 mmol/L    Comment: CRITICAL RESULT CALLED TO, READ BACK BY AND VERIFIED WITH MAJORIE SPANNER RN @ [phone] 10/18/23 [facility] Performed at [facility], [address] [place], [place] [address]   Urinalysis, Complete w Microscopic -Urine, Clean Catch     Status: Abnormal   Collection Time: 10/18/23 10:16 PM  Result Value Ref Range   Color, Urine YELLOW (A) YELLOW   APPearance CLEAR (  A) CLEAR   Specific Gravity, Urine 1.028 1.005 - 1.030   pH 5.0 5.0 - 8.0   Glucose, UA NEGATIVE NEGATIVE mg/dL   Hgb urine dipstick MODERATE (A) NEGATIVE   Bilirubin Urine NEGATIVE NEGATIVE   Ketones, ur NEGATIVE NEGATIVE mg/dL   Protein, ur 30 (A) NEGATIVE mg/dL   Nitrite NEGATIVE NEGATIVE   Leukocytes,Ua NEGATIVE NEGATIVE   RBC / HPF 0-5 0 - 5 RBC/hpf   WBC, UA 0-5 0 - 5 WBC/hpf   Bacteria, UA NONE SEEN NONE SEEN   Squamous Epithelial / HPF 0-5 0 - 5 /HPF    Comment: Performed at [facility], [address] [place], [place] [address]  Sodium, urine, random     Status: None   Collection Time: 10/18/23 10:16 PM  Result Value Ref Range   Sodium, Ur 18 mmol/L    Comment: Performed at [facility], [address] [place], [place] [address]  Basic metabolic panel     Status: Abnormal   Collection Time: 10/18/23 11:40 PM  Result Value Ref Range   Sodium 129 (L) 135 - 145 mmol/L   Potassium 3.7 3.5 - 5.1 mmol/L   Chloride 95 (L) 98 - 111 mmol/L   CO2 18 (L) 22 - 32 mmol/L   Glucose, Bld 220 (H) 70 - 99 mg/dL    Comment: Glucose reference range applies only to samples taken after fasting for at least 8 hours.   BUN 23 (H) 6 - 20 mg/dL   Creatinine, Ser 9.32 0.44 - 1.00 mg/dL   Calcium 9.4 8.9 - 89.6 mg/dL   GFR, Estimated >39 >39 mL/min    Comment: (NOTE) Calculated using the CKD-EPI Creatinine Equation (2021)    Anion gap 16 (H) 5 - 15    Comment: Performed at [facility], [address] [place], [place] [address]  Magnesium      Status: Abnormal   Collection Time: 10/18/23 11:40 PM  Result Value Ref Range   Magnesium  2.7 (H) 1.7 - 2.4 mg/dL    Comment: Performed at [facility], [address] [place], [place] [address]  CBC     Status: Abnormal   Collection Time: 10/19/23  6:15 AM  Result Value Ref Range   WBC 17.4 (H) 4.0 - 10.5 K/uL   RBC 4.06 3.87 - 5.11 MIL/uL   Hemoglobin 14.0 12.0 - 15.0 g/dL   HCT 60.6 63.9 - 53.9 %   MCV 96.8 80.0 - 100.0 fL   MCH 34.5 (H) 26.0 - 34.0 pg   MCHC 35.6 30.0 - 36.0 g/dL   RDW 88.2 88.4 - 84.4 %   Platelets 255 150 - 400 K/uL   nRBC 0.0 0.0 - 0.2 %    Comment: Performed at [facility], [address] [place], [place] [address]  Comprehensive metabolic panel     Status: Abnormal   Collection Time: 10/19/23  6:15 AM  Result Value Ref Range   Sodium 129 (L) 135 - 145 mmol/L   Potassium 3.8 3.5 - 5.1 mmol/L   Chloride 92 (L) 98 - 111 mmol/L   CO2 25 22 - 32 mmol/L   Glucose, Bld 192 (H) 70 - 99  mg/dL    Comment: Glucose reference range applies only to samples taken after fasting for at least 8 hours.   BUN 15 6 - 20 mg/dL   Creatinine, Ser 9.41 0.44 - 1.00 mg/dL   Calcium 9.2 8.9 - 89.6 mg/dL   Total Protein  7.2 6.5 - 8.1 g/dL   Albumin 3.7 3.5 - 5.0 g/dL   AST 27 15 - 41 U/L   ALT 13 0 - 44 U/L   Alkaline Phosphatase 79 38 - 126 U/L   Total Bilirubin 1.4 (H) 0.0 - 1.2 mg/dL   GFR, Estimated >39 >39 mL/min    Comment: (NOTE) Calculated using the CKD-EPI Creatinine Equation (2021)    Anion gap 12 5 - 15    Comment: Performed at [facility], [address] [place], [place] [address]  Protime-INR     Status: None   Collection Time: 10/19/23  6:15 AM  Result Value Ref Range   Prothrombin Time 14.0 11.4 - 15.2 seconds   INR 1.1 0.8 - 1.2    Comment: (NOTE) INR goal varies based on device and disease states. Performed at [facility], [address] [place], [place] [address]   Lactic acid, plasma     Status: None   Collection Time: 10/19/23  6:15 AM  Result Value Ref Range   Lactic Acid, Venous 1.7 0.5 - 1.9 mmol/L    Comment: Performed at [facility], [address] [place], [place] [address]  Sodium     Status: Abnormal   Collection Time: 10/19/23  4:40 PM  Result Value Ref Range   Sodium 131 (L) 135 - 145 mmol/L    Comment: Performed at [facility], [address] [place], [place] [address]  CBC     Status: Abnormal   Collection Time: 10/20/23  5:07 AM  Result Value Ref Range   WBC 12.7 (H) 4.0 - 10.5 K/uL   RBC 3.72 (L) 3.87 - 5.11 MIL/uL   Hemoglobin 12.8 12.0 - 15.0 g/dL   HCT 62.6 63.9 - 53.9 %   MCV 100.3 (H) 80.0 - 100.0 fL   MCH 34.4 (H) 26.0 - 34.0 pg   MCHC 34.3 30.0 - 36.0 g/dL   RDW 88.0 88.4 - 84.4 %   Platelets 238 150 - 400 K/uL   nRBC 0.0 0.0 - 0.2 %    Comment: Performed at [facility], [address] [place], [place] [address]  Lipase, blood     Status: Abnormal   Collection Time:  10/20/23  5:07 AM  Result Value Ref Range   Lipase 55 (H) 11 - 51 U/L    Comment: Performed at [facility], [address] [place], [place] [address]  Basic metabolic panel     Status: Abnormal   Collection Time: 10/20/23  5:07 AM  Result Value Ref Range   Sodium 132 (L) 135 - 145 mmol/L   Potassium 3.6 3.5 - 5.1 mmol/L    Comment: HEMOLYSIS AT THIS LEVEL MAY AFFECT RESULT   Chloride 95 (L) 98 - 111 mmol/L   CO2 23 22 - 32 mmol/L   Glucose, Bld 164 (H) 70 - 99 mg/dL    Comment: Glucose reference range applies only to samples taken after fasting for at least 8 hours.   BUN 13 6 - 20 mg/dL   Creatinine, Ser 9.35 0.44 - 1.00 mg/dL   Calcium 9.3 8.9 - 89.6 mg/dL   GFR, Estimated >39 >39 mL/min    Comment: (NOTE) Calculated using the CKD-EPI Creatinine Equation (2021)    Anion gap 14 5 - 15    Comment: Performed at [facility], [address], [place], [place] [address]  Basic metabolic panel     Status: Abnormal   Collection Time:  10/21/23  6:55 AM  Result Value Ref Range   Sodium 133 (L) 135 - 145 mmol/L   Potassium 4.0 3.5 - 5.1 mmol/L   Chloride 100 98 - 111 mmol/L   CO2 20 (L) 22 - 32 mmol/L   Glucose, Bld 158 (H) 70 - 99 mg/dL    Comment: Glucose reference range applies only to samples taken after fasting for at least 8 hours.   BUN 9 6 - 20 mg/dL   Creatinine, Ser 9.45 0.44 - 1.00 mg/dL   Calcium 9.1 8.9 - 89.6 mg/dL   GFR, Estimated >39 >39 mL/min    Comment: (NOTE) Calculated using the CKD-EPI Creatinine Equation (2021)    Anion gap 13 5 - 15    Comment: Performed at [facility], [address] [place], [place] [address]  Magnesium      Status: None   Collection Time: 10/21/23  6:55 AM  Result Value Ref Range   Magnesium  2.3 1.7 - 2.4 mg/dL    Comment: Performed at [facility], [address] [place], [place] [address]  CBC     Status: Abnormal   Collection Time: 10/21/23  7:48 AM  Result Value Ref Range   WBC 11.1 (H)  4.0 - 10.5 K/uL   RBC 3.56 (L) 3.87 - 5.11 MIL/uL   Hemoglobin 12.1 12.0 - 15.0 g/dL   HCT 64.4 (L) 63.9 - 53.9 %   MCV 99.7 80.0 - 100.0 fL   MCH 34.0 26.0 - 34.0 pg   MCHC 34.1 30.0 - 36.0 g/dL   RDW 88.2 88.4 - 84.4 %   Platelets 334 150 - 400 K/uL   nRBC 0.0 0.0 - 0.2 %    Comment: Performed at [facility], [address], [place], [place] [address]  Basic metabolic panel     Status: Abnormal   Collection Time: 10/22/23 11:42 AM  Result Value Ref Range   Sodium 129 (L) 135 - 145 mmol/L   Potassium 4.4 3.5 - 5.1 mmol/L   Chloride 91 (L) 98 - 111 mmol/L   CO2 25 22 - 32 mmol/L   Glucose, Bld 211 (H) 70 - 99 mg/dL    Comment: Glucose reference range applies only to samples taken after fasting for at least 8 hours.   BUN 9 6 - 20 mg/dL   Creatinine, Ser 9.40 0.44 - 1.00 mg/dL   Calcium 9.6 8.9 - 89.6 mg/dL   GFR, Estimated >39 >39 mL/min    Comment: (NOTE) Calculated using the CKD-EPI Creatinine Equation (2021)    Anion gap 13 5 - 15    Comment: Performed at [facility], [address] [place], [place] [address]  CBC     Status: Abnormal   Collection Time: 10/22/23 11:42 AM  Result Value Ref Range   WBC 5.5 4.0 - 10.5 K/uL   RBC 3.42 (L) 3.87 - 5.11 MIL/uL   Hemoglobin 11.7 (L) 12.0 - 15.0 g/dL   HCT 65.6 (L) 63.9 - 53.9 %   MCV 100.3 (H) 80.0 - 100.0 fL   MCH 34.2 (H) 26.0 - 34.0 pg   MCHC 34.1 30.0 - 36.0 g/dL   RDW 87.9 88.4 - 84.4 %   Platelets 260 150 - 400 K/uL   nRBC 0.0 0.0 - 0.2 %    Comment: Performed at [facility], [address] [place], [place] [address]  Magnesium      Status: None   Collection Time: 10/22/23 11:42 AM  Result Value  Ref Range   Magnesium  2.2 1.7 - 2.4 mg/dL    Comment: Performed at [facility], [address] [place], [place] [address]  Lactic acid, plasma     Status: None   Collection Time: 10/22/23  7:28 PM  Result Value Ref Range   Lactic Acid, Venous 1.1 0.5 - 1.9 mmol/L    Comment:  Performed at [facility], [address] [place], [place] [address]  Basic metabolic panel     Status: Abnormal   Collection Time: 10/23/23  6:36 AM  Result Value Ref Range   Sodium 126 (L) 135 - 145 mmol/L   Potassium 4.1 3.5 - 5.1 mmol/L   Chloride 91 (L) 98 - 111 mmol/L   CO2 23 22 - 32 mmol/L   Glucose, Bld 259 (H) 70 - 99 mg/dL    Comment: Glucose reference range applies only to samples taken after fasting for at least 8 hours.   BUN 6 6 - 20 mg/dL   Creatinine, Ser 9.37 0.44 - 1.00 mg/dL   Calcium 8.8 (L) 8.9 - 10.3 mg/dL   GFR, Estimated >39 >39 mL/min    Comment: (NOTE) Calculated using the CKD-EPI Creatinine Equation (2021)    Anion gap 12 5 - 15    Comment: Performed at [facility], [address] [place], [place] [address]  CBC     Status: Abnormal   Collection Time: 10/23/23  6:36 AM  Result Value Ref Range   WBC 9.3 4.0 - 10.5 K/uL   RBC 3.10 (L) 3.87 - 5.11 MIL/uL   Hemoglobin 10.6 (L) 12.0 - 15.0 g/dL   HCT 68.6 (L) 63.9 - 53.9 %   MCV 101.0 (H) 80.0 - 100.0 fL   MCH 34.2 (H) 26.0 - 34.0 pg   MCHC 33.9 30.0 - 36.0 g/dL   RDW 87.5 88.4 - 84.4 %   Platelets 227 150 - 400 K/uL   nRBC 0.0 0.0 - 0.2 %    Comment: Performed at [facility], [address], [place], [place] [address]  Magnesium      Status: None   Collection Time: 10/23/23  6:36 AM  Result Value Ref Range   Magnesium  1.7 1.7 - 2.4 mg/dL    Comment: Performed at [facility], [address] [place], [place] [address]  Culture, blood (Routine X 2) w Reflex to ID Panel     Status: Abnormal   Collection Time: 10/23/23  9:11 AM   Specimen: BLOOD  Result Value Ref Range   Specimen Description      BLOOD LH Performed at [facility], [address], [place], [place] [address]    Special Requests      BOTTLES DRAWN AEROBIC AND ANAEROBIC Blood Culture adequate volume Performed at [facility], [address] [place], [place] [address]     Culture  Setup Time      GRAM POSITIVE COCCI IN BOTH AEROBIC AND ANAEROBIC BOTTLES CRITICAL RESULT CALLED TO, READ BACK BY AND VERIFIED WITH: WILL ANDERSON @2010  ON 10/23/23 SKL    Culture (A)     METHICILLIN RESISTANT STAPHYLOCOCCUS AUREUS SEE SEPARATE REPORT FOR DAPTOMYCIN  RESULT Performed at [facility], [address] [place], [place] [address]    Report Status 11/03/2023 FINAL    Organism ID, Bacteria METHICILLIN RESISTANT STAPHYLOCOCCUS AUREUS       Susceptibility   Methicillin resistant staphylococcus aureus - MIC*    CIPROFLOXACIN <=0.5 SENSITIVE Sensitive  ERYTHROMYCIN >=8 RESISTANT Resistant     GENTAMICIN  <=0.5 SENSITIVE Sensitive     OXACILLIN >=4 RESISTANT Resistant     TETRACYCLINE <=1 SENSITIVE Sensitive     VANCOMYCIN  1 SENSITIVE Sensitive     TRIMETH /SULFA  <=10 SENSITIVE Sensitive     CLINDAMYCIN  <=0.25 SENSITIVE Sensitive     RIFAMPIN  <=0.5 SENSITIVE Sensitive     Inducible Clindamycin  NEGATIVE Sensitive     LINEZOLID  2 SENSITIVE Sensitive     * METHICILLIN RESISTANT STAPHYLOCOCCUS AUREUS  Culture, blood (Routine X 2) w Reflex to ID Panel     Status: Abnormal   Collection Time: 10/23/23  9:11 AM   Specimen: BLOOD  Result Value Ref Range   Specimen Description      BLOOD RH Performed at [facility], [address], [place], [place] [address]    Special Requests      BOTTLES DRAWN AEROBIC AND ANAEROBIC Blood Culture adequate volume Performed at [facility], [address] [place], [place] [address]    Culture  Setup Time      GRAM POSITIVE COCCI IN BOTH AEROBIC AND ANAEROBIC BOTTLES CRITICAL VALUE NOTED.  VALUE IS CONSISTENT WITH PREVIOUSLY REPORTED AND CALLED VALUE. SKL GRAM STAIN REVIEWED-AGREE WITH RESULT DRT    Culture (A)     STAPHYLOCOCCUS AUREUS SUSCEPTIBILITIES PERFORMED ON PREVIOUS CULTURE WITHIN THE LAST 5 DAYS. Performed at [facility], [address], [place], [place] [address]    Report Status  10/26/2023 FINAL   Procalcitonin     Status: None   Collection Time: 10/23/23  9:11 AM  Result Value Ref Range   Procalcitonin 0.88 ng/mL    Comment:        Interpretation: PCT > 0.5 ng/mL and <= 2 ng/mL: Systemic infection (sepsis) is possible, but other conditions are known to elevate PCT as well. (NOTE)       Sepsis PCT Algorithm           Lower Respiratory Tract                                      Infection PCT Algorithm    ----------------------------     ----------------------------         PCT < 0.25 ng/mL                PCT < 0.10 ng/mL          Strongly encourage             Strongly discourage   discontinuation of antibiotics    initiation of antibiotics    ----------------------------     -----------------------------       PCT 0.25 - 0.50 ng/mL            PCT 0.10 - 0.25 ng/mL               OR       >80% decrease in PCT            Discourage initiation of                                            antibiotics      Encourage discontinuation           of antibiotics    ----------------------------     -----------------------------  PCT >= 0.50 ng/mL              PCT 0.26 - 0.50 ng/mL                AND       <80% decrease in PCT             Encourage initiation of                                             antibiotics       Encourage continuation           of antibiotics    ----------------------------     -----------------------------        PCT >= 0.50 ng/mL                  PCT > 0.50 ng/mL               AND         increase in PCT                  Strongly encourage                                      initiation of antibiotics    Strongly encourage escalation           of antibiotics                                     -----------------------------                                           PCT <= 0.25 ng/mL                                                 OR                                        > 80% decrease in PCT                                       Discontinue / Do not initiate                                             antibiotics  Performed at [facility], [address] [place], [place] [address]   Blood Culture ID Panel (Reflexed)     Status: Abnormal   Collection Time: 10/23/23  9:11 AM  Result Value Ref Range   Enterococcus faecalis NOT DETECTED NOT DETECTED   Enterococcus Faecium NOT DETECTED NOT DETECTED   Listeria monocytogenes NOT DETECTED NOT DETECTED  Staphylococcus species DETECTED (A) NOT DETECTED    Comment: CRITICAL RESULT CALLED TO, READ BACK BY AND VERIFIED WITH: WILL ANDERSON @2010  ON 10/23/23 SKL    Staphylococcus aureus (BCID) DETECTED (A) NOT DETECTED    Comment: Methicillin (oxacillin)-resistant Staphylococcus aureus (MRSA). MRSA is predictably resistant to beta-lactam antibiotics (except ceftaroline). Preferred therapy is vancomycin  unless clinically contraindicated. Patient requires contact precautions if  hospitalized. CRITICAL RESULT CALLED TO, READ BACK BY AND VERIFIED WITH: WILL ANDERSON @2010  ON 10/23/23 SKL    Staphylococcus epidermidis NOT DETECTED NOT DETECTED   Staphylococcus lugdunensis NOT DETECTED NOT DETECTED   Streptococcus species NOT DETECTED NOT DETECTED   Streptococcus agalactiae NOT DETECTED NOT DETECTED   Streptococcus pneumoniae NOT DETECTED NOT DETECTED   Streptococcus pyogenes NOT DETECTED NOT DETECTED   A.calcoaceticus-baumannii NOT DETECTED NOT DETECTED   Bacteroides fragilis NOT DETECTED NOT DETECTED   Enterobacterales NOT DETECTED NOT DETECTED   Enterobacter cloacae complex NOT DETECTED NOT DETECTED   Escherichia coli NOT DETECTED NOT DETECTED   Klebsiella aerogenes NOT DETECTED NOT DETECTED   Klebsiella oxytoca NOT DETECTED NOT DETECTED   Klebsiella pneumoniae NOT DETECTED NOT DETECTED   Proteus species NOT DETECTED NOT DETECTED   Salmonella species NOT DETECTED NOT DETECTED   Serratia marcescens NOT DETECTED NOT DETECTED   Haemophilus influenzae NOT  DETECTED NOT DETECTED   Neisseria meningitidis NOT DETECTED NOT DETECTED   Pseudomonas aeruginosa NOT DETECTED NOT DETECTED   Stenotrophomonas maltophilia NOT DETECTED NOT DETECTED   Candida albicans NOT DETECTED NOT DETECTED   Candida auris NOT DETECTED NOT DETECTED   Candida glabrata NOT DETECTED NOT DETECTED   Candida krusei NOT DETECTED NOT DETECTED   Candida parapsilosis NOT DETECTED NOT DETECTED   Candida tropicalis NOT DETECTED NOT DETECTED   Cryptococcus neoformans/gattii NOT DETECTED NOT DETECTED   Meth resistant mecA/C and MREJ DETECTED (A) NOT DETECTED    Comment: CRITICAL RESULT CALLED TO, READ BACK BY AND VERIFIED WITH: WILL ANDERSON @2010  ON 10/23/23 SKL Performed at [facility], [address] [place], [place] [address]   MIC (1 Drug)-blood culture; 10/23/2023; BLOOD RIGHT HAND; MRSA; Daptomycin      Status: Abnormal   Collection Time: 10/23/23  9:11 AM   Specimen: BLOOD RIGHT HAND  Result Value Ref Range   Min Inhibitory Conc (1 Drug) Final report (A)     Comment: (NOTE) Performed At: [facility] [employer] [address] [place], [place] [id number] Jennette Shorter MD Ey:[phone]    Source BLOOD     Comment: Performed at [facility], [address] [place], [place] [address]  MIC Result     Status: Abnormal   Collection Time: 10/23/23  9:11 AM  Result Value Ref Range   Result 1 (MIC) Comment (A)     Comment: (NOTE) Methicillin - resistant Staphylococcus aureus Identification performed by account, not confirmed by this laboratory. Testing performed by broth microdilution. DAPTOMYCIN    0.5 ug/mL SUSCEPTIBLE Performed At: [facility] [address] [place], [place] [id number] Jennette Shorter MD Ey:[phone]   Urinalysis, Complete w Microscopic -Urine, Clean Catch     Status: Abnormal   Collection Time: 10/23/23 11:34 AM  Result Value Ref Range   Color, Urine YELLOW (A) YELLOW   APPearance HAZY (A) CLEAR   Specific Gravity, Urine 1.015  1.005 - 1.030   pH 6.0 5.0 - 8.0   Glucose, UA >=500 (A) NEGATIVE mg/dL   Hgb urine dipstick SMALL (A) NEGATIVE   Bilirubin Urine NEGATIVE NEGATIVE   Ketones,  ur NEGATIVE NEGATIVE mg/dL   Protein, ur 30 (A) NEGATIVE mg/dL   Nitrite NEGATIVE NEGATIVE   Leukocytes,Ua NEGATIVE NEGATIVE   RBC / HPF 0 0 - 5 RBC/hpf   WBC, UA 0-5 0 - 5 WBC/hpf   Bacteria, UA RARE (A) NONE SEEN   Squamous Epithelial / HPF 0-5 0 - 5 /HPF    Comment: Performed at [facility], [address] [place], [place] [address]  Basic metabolic panel     Status: Abnormal   Collection Time: 10/24/23  5:30 AM  Result Value Ref Range   Sodium 125 (L) 135 - 145 mmol/L   Potassium 3.5 3.5 - 5.1 mmol/L   Chloride 93 (L) 98 - 111 mmol/L   CO2 21 (L) 22 - 32 mmol/L   Glucose, Bld 244 (H) 70 - 99 mg/dL    Comment: Glucose reference range applies only to samples taken after fasting for at least 8 hours.   BUN 6 6 - 20 mg/dL   Creatinine, Ser 9.25 0.44 - 1.00 mg/dL   Calcium 8.8 (L) 8.9 - 10.3 mg/dL   GFR, Estimated >39 >39 mL/min    Comment: (NOTE) Calculated using the CKD-EPI Creatinine Equation (2021)    Anion gap 11 5 - 15    Comment: Performed at [facility], [address] [place], [place] [address]  CBC     Status: Abnormal   Collection Time: 10/24/23  5:30 AM  Result Value Ref Range   WBC 9.4 4.0 - 10.5 K/uL   RBC 2.93 (L) 3.87 - 5.11 MIL/uL   Hemoglobin 10.0 (L) 12.0 - 15.0 g/dL   HCT 70.5 (L) 63.9 - 53.9 %   MCV 100.3 (H) 80.0 - 100.0 fL   MCH 34.1 (H) 26.0 - 34.0 pg   MCHC 34.0 30.0 - 36.0 g/dL   RDW 87.2 88.4 - 84.4 %   Platelets 201 150 - 400 K/uL   nRBC 0.0 0.0 - 0.2 %    Comment: Performed at [facility], [address] [place], [place] [address]  Magnesium      Status: Abnormal   Collection Time: 10/24/23  5:30 AM  Result Value Ref Range   Magnesium  1.6 (L) 1.7 - 2.4 mg/dL    Comment: Performed at [facility], [address] [place], [place] [address]   ECHOCARDIOGRAM COMPLETE     Status: None   Collection Time: 10/24/23  3:15 PM  Result Value Ref Range   Weight 1,920 oz   Height 60 in   BP 121/87 mmHg   Ao pk vel 1.44 m/s   AV Area VTI 2.64 cm2   AR max vel 2.75 cm2   AV Mean grad 5.0 mmHg   AV Peak grad 8.3 mmHg   S' Lateral 2.80 cm   AV Area mean vel 2.48 cm2   Area-P 1/2 6.12 cm2   MV VTI 2.97 cm2   Est EF 60 - 65%   Basic metabolic panel     Status: Abnormal   Collection Time: 10/25/23  4:44 AM  Result Value Ref Range   Sodium 126 (L) 135 - 145 mmol/L   Potassium 3.3 (L) 3.5 - 5.1 mmol/L   Chloride 95 (L) 98 - 111 mmol/L   CO2 23 22 - 32 mmol/L   Glucose, Bld 297 (H) 70 - 99 mg/dL    Comment: Glucose reference range applies only to samples taken after fasting for at least 8 hours.   BUN <5 (L)  6 - 20 mg/dL   Creatinine, Ser 9.36 0.44 - 1.00 mg/dL   Calcium 9.0 8.9 - 89.6 mg/dL   GFR, Estimated >39 >39 mL/min    Comment: (NOTE) Calculated using the CKD-EPI Creatinine Equation (2021)    Anion gap 8 5 - 15    Comment: Performed at [facility], [address] [place], [place] [address]  CBC     Status: Abnormal   Collection Time: 10/25/23  4:44 AM  Result Value Ref Range   WBC 12.0 (H) 4.0 - 10.5 K/uL   RBC 2.80 (L) 3.87 - 5.11 MIL/uL   Hemoglobin 9.5 (L) 12.0 - 15.0 g/dL   HCT 72.7 (L) 63.9 - 53.9 %   MCV 97.1 80.0 - 100.0 fL   MCH 33.9 26.0 - 34.0 pg   MCHC 34.9 30.0 - 36.0 g/dL   RDW 87.4 88.4 - 84.4 %   Platelets 183 150 - 400 K/uL   nRBC 0.0 0.0 - 0.2 %    Comment: Performed at [facility], [address], [place], [place] [address]  Magnesium      Status: None   Collection Time: 10/25/23  4:44 AM  Result Value Ref Range   Magnesium  1.7 1.7 - 2.4 mg/dL    Comment: Performed at [facility], [address] [place], [place] [address]  Phosphorus     Status: Abnormal   Collection Time: 10/25/23  4:44 AM  Result Value Ref Range   Phosphorus 2.2 (L) 2.5 - 4.6 mg/dL     Comment: Performed at [facility], [address], [place], [place] [address]  Uric acid     Status: None   Collection Time: 10/25/23  4:44 AM  Result Value Ref Range   Uric Acid, Serum 2.9 2.5 - 7.1 mg/dL    Comment: HEMOLYSIS AT THIS LEVEL MAY AFFECT RESULT Performed at [facility], [address] [place], [place] [address]   Culture, blood (Routine X 2) w Reflex to ID Panel     Status: Abnormal   Collection Time: 10/25/23  4:53 PM   Specimen: BLOOD  Result Value Ref Range   Specimen Description      BLOOD BLOOD RIGHT HAND Performed at [facility], [address] [place], [place] [address]    Special Requests      BOTTLES DRAWN AEROBIC AND ANAEROBIC Blood Culture results may not be optimal due to an inadequate volume of blood received in culture bottles Performed at [facility] [address] [place], [place] [address]    Culture  Setup Time      GRAM POSITIVE COCCI AEROBIC BOTTLE ONLY CRITICAL RESULT CALLED TO, READ BACK BY AND VERIFIED WITH: P/C WILL ANDERSON @ 2010 10/23/23 SKL    Culture (A)     STAPHYLOCOCCUS AUREUS SUSCEPTIBILITIES PERFORMED ON PREVIOUS CULTURE WITHIN THE LAST 5 DAYS. Performed at [facility], [address], [place], [place] [address]    Report Status 10/27/2023 FINAL   Culture, blood (Routine X 2) w Reflex to ID Panel     Status: Abnormal   Collection Time: 10/25/23  4:53 PM   Specimen: BLOOD  Result Value Ref Range   Specimen Description      BLOOD BLOOD RIGHT ARM Performed at [facility], [address], [place], [place] [address]    Special Requests      BOTTLES DRAWN AEROBIC ONLY Blood Culture results may not be optimal due to an inadequate volume  of blood received in culture bottles Performed at [facility], [address] [place], [place] [address]    Culture  Setup Time      GRAM POSITIVE COCCI AEROBIC BOTTLE ONLY CRITICAL VALUE NOTED.  VALUE IS CONSISTENT  WITH PREVIOUSLY REPORTED AND CALLED VALUE.    Culture (A)     STAPHYLOCOCCUS AUREUS SUSCEPTIBILITIES PERFORMED ON PREVIOUS CULTURE WITHIN THE LAST 5 DAYS. Performed at [facility], [address], [place], [place] [address]    Report Status 10/27/2023 FINAL   Basic metabolic panel     Status: Abnormal   Collection Time: 10/26/23  8:19 AM  Result Value Ref Range   Sodium 127 (L) 135 - 145 mmol/L   Potassium 2.7 (LL) 3.5 - 5.1 mmol/L    Comment: CRITICAL RESULT CALLED TO, READ BACK BY AND VERIFIED WITH TENISHA RAGLANDCOLVIN @0923  10/26/23 MJU    Chloride 93 (L) 98 - 111 mmol/L   CO2 23 22 - 32 mmol/L   Glucose, Bld 412 (H) 70 - 99 mg/dL    Comment: Glucose reference range applies only to samples taken after fasting for at least 8 hours.   BUN <5 (L) 6 - 20 mg/dL   Creatinine, Ser 9.47 0.44 - 1.00 mg/dL   Calcium 8.6 (L) 8.9 - 10.3 mg/dL   GFR, Estimated >39 >39 mL/min    Comment: (NOTE) Calculated using the CKD-EPI Creatinine Equation (2021)    Anion gap 11 5 - 15    Comment: Performed at [facility], [address] [place], [place] [address]  CBC     Status: Abnormal   Collection Time: 10/26/23  8:19 AM  Result Value Ref Range   WBC 11.0 (H) 4.0 - 10.5 K/uL   RBC 2.81 (L) 3.87 - 5.11 MIL/uL   Hemoglobin 9.4 (L) 12.0 - 15.0 g/dL   HCT 72.2 (L) 63.9 - 53.9 %   MCV 98.6 80.0 - 100.0 fL   MCH 33.5 26.0 - 34.0 pg   MCHC 33.9 30.0 - 36.0 g/dL   RDW 86.9 88.4 - 84.4 %   Platelets 211 150 - 400 K/uL   nRBC 0.0 0.0 - 0.2 %    Comment: Performed at [facility], [address], [place], [place] [address]  Magnesium      Status: None   Collection Time: 10/26/23  8:19 AM  Result Value Ref Range   Magnesium  1.8 1.7 - 2.4 mg/dL    Comment: Performed at [facility], [address], [place], [place] [address]  Phosphorus     Status: None   Collection Time: 10/26/23  8:19 AM  Result Value Ref Range   Phosphorus 3.5 2.5 - 4.6 mg/dL    Comment:  Performed at [facility], [address] [place], [place] [address]  Lipase, blood     Status: None   Collection Time: 10/26/23  8:19 AM  Result Value Ref Range   Lipase 23 11 - 51 U/L    Comment: Performed at [facility], [address] [place], [place] [address]  Culture, blood (Routine X 2) w Reflex to ID Panel     Status: Abnormal   Collection Time: 10/26/23  1:54 PM   Specimen: BLOOD RIGHT ARM  Result Value Ref Range   Specimen Description      BLOOD RIGHT ARM Performed at [facility], [address], [place], [place] [address]    Special Requests      BOTTLES  DRAWN AEROBIC AND ANAEROBIC Blood Culture adequate volume Performed at [facility], [address] [place], [place] [address]    Culture  Setup Time      GRAM POSITIVE COCCI ANAEROBIC BOTTLE ONLY CRITICAL VALUE NOTED.  VALUE IS CONSISTENT WITH PREVIOUSLY REPORTED AND CALLED VALUE. GRAM STAIN REVIEWED-AGREE WITH RESULT DRT    Culture (A)     STAPHYLOCOCCUS AUREUS SUSCEPTIBILITIES PERFORMED ON PREVIOUS CULTURE WITHIN THE LAST 5 DAYS. Performed at [facility], [address] [place], [place] [address]    Report Status 10/29/2023 FINAL   Culture, blood (Routine X 2) w Reflex to ID Panel     Status: Abnormal   Collection Time: 10/26/23  2:00 PM   Specimen: BLOOD RIGHT FOREARM  Result Value Ref Range   Specimen Description      BLOOD RIGHT FOREARM Performed at [facility], [address], [place], [place] [address]    Special Requests      BOTTLES DRAWN AEROBIC AND ANAEROBIC Blood Culture results may not be optimal due to an inadequate volume of blood received in culture bottles Performed at [facility], [address] [place], [place] [address]    Culture  Setup Time      GRAM POSITIVE COCCI IN BOTH AEROBIC AND ANAEROBIC BOTTLES GRAM STAIN REVIEWED-AGREE WITH RESULT DRT    Culture (A)     STAPHYLOCOCCUS AUREUS SUSCEPTIBILITIES PERFORMED ON  PREVIOUS CULTURE WITHIN THE LAST 5 DAYS. Performed at [facility], [address], [place], [place] [address]    Report Status 10/29/2023 FINAL   Glucose, capillary     Status: Abnormal   Collection Time: 10/26/23  2:56 PM  Result Value Ref Range   Glucose-Capillary 412 (H) 70 - 99 mg/dL    Comment: Glucose reference range applies only to samples taken after fasting for at least 8 hours.  Basic metabolic panel     Status: Abnormal   Collection Time: 10/26/23  3:44 PM  Result Value Ref Range   Sodium 125 (L) 135 - 145 mmol/L   Potassium 3.3 (L) 3.5 - 5.1 mmol/L   Chloride 92 (L) 98 - 111 mmol/L   CO2 24 22 - 32 mmol/L   Glucose, Bld 400 (H) 70 - 99 mg/dL    Comment: Glucose reference range applies only to samples taken after fasting for at least 8 hours.   BUN <5 (L) 6 - 20 mg/dL   Creatinine, Ser <9.69 (L) 0.44 - 1.00 mg/dL   Calcium 8.6 (L) 8.9 - 10.3 mg/dL   GFR, Estimated NOT CALCULATED >60 mL/min    Comment: (NOTE) Calculated using the CKD-EPI Creatinine Equation (2021)    Anion gap 9 5 - 15    Comment: Performed at [facility], [address] [place], [place] [address]  Glucose, capillary     Status: Abnormal   Collection Time: 10/26/23 10:15 PM  Result Value Ref Range   Glucose-Capillary 183 (H) 70 - 99 mg/dL    Comment: Glucose reference range applies only to samples taken after fasting for at least 8 hours.   Comment 1 Notify RN   Surgical pathology     Status: None   Collection Time: 10/27/23 12:00 AM  Result Value Ref Range   SURGICAL PATHOLOGY      SURGICAL PATHOLOGY [facility] [address] [place], [place] [address] Telephone [phone] or [phone] Fax [phone]  REPORT OF SURGICAL PATHOLOGY  Accession #: [id number] Patient Name: REGINNA, SERMENO Visit # : [id number]  MRN: [id number] Physician: Malvin Blunt DOB/Age 08/26/1976 (Age: 70) Gender: F Collected Date: 10/27/2023 Received  Date: 10/27/2023  FINAL DIAGNOSIS       1. Bone, biopsy, Left first metatarsal head :       - ACUTE OSTEOMYELITIS.       DATE SIGNED OUT: 10/28/2023 ELECTRONIC SIGNATURE : Janel Md, Rexene , Pathologist, Electronic Signature  MICROSCOPIC DESCRIPTION  CASE COMMENTS STAINS USED IN DIAGNOSIS: H&E    CLINICAL HISTORY  SPECIMEN(S) OBTAINED 1. Bone, biopsy, Left First Metatarsal Head  SPECIMEN COMMENTS: SPECIMEN CLINICAL INFORMATION: 1. left foot septic arthritis    Gross Description 1. Bone 1st metatarsal head, received fresh and placed in formalin is a 1. 1 cm long pink-tan bone core with a 0.2 cm in diameter. The specimen is submitted in toto in 1 block (1A) and palced in decalcification solution.      [facility] 10/27/2023        Report signed out from the following location(s) [facility] [address] [place], [place] [address] CLIA #: [id number]  [facility] [address] [place], [place] [address] CLIA #: [id number]   Basic metabolic panel     Status: Abnormal   Collection Time: 10/27/23  4:35 AM  Result Value Ref Range   Sodium 131 (L) 135 - 145 mmol/L   Potassium 3.4 (L) 3.5 - 5.1 mmol/L   Chloride 93 (L) 98 - 111 mmol/L   CO2 23 22 - 32 mmol/L   Glucose, Bld 200 (H) 70 - 99 mg/dL    Comment: Glucose reference range applies only to samples taken after fasting for at least 8 hours.   BUN 5 (L) 6 - 20 mg/dL   Creatinine, Ser 9.59 (L) 0.44 - 1.00 mg/dL   Calcium 8.9 8.9 - 89.6 mg/dL   GFR, Estimated >39 >39 mL/min    Comment: (NOTE) Calculated using the CKD-EPI Creatinine Equation (2021)    Anion gap 15 5 - 15    Comment: Performed at [facility], [address] [place], [place] [address]  CBC     Status: Abnormal   Collection Time: 10/27/23  4:35 AM  Result Value Ref Range   WBC 16.4 (H) 4.0 - 10.5 K/uL   RBC 3.28 (L) 3.87 - 5.11 MIL/uL   Hemoglobin 10.9 (L) 12.0 - 15.0 g/dL   HCT 68.7 (L) 63.9 -  46.0 %   MCV 95.1 80.0 - 100.0 fL   MCH 33.2 26.0 - 34.0 pg   MCHC 34.9 30.0 - 36.0 g/dL   RDW 87.2 88.4 - 84.4 %   Platelets 278 150 - 400 K/uL   nRBC 0.0 0.0 - 0.2 %    Comment: Performed at [facility], [address], [place], [place] [address]  Magnesium      Status: None   Collection Time: 10/27/23  4:35 AM  Result Value Ref Range   Magnesium  1.8 1.7 - 2.4 mg/dL    Comment: Performed at [facility], [address], [place], [place] [address]  Phosphorus     Status: None   Collection Time: 10/27/23  4:35 AM  Result Value Ref Range   Phosphorus 3.0 2.5 - 4.6 mg/dL    Comment: Performed at [facility], [address] [place], [place] [address]  Vancomycin , peak     Status: Abnormal   Collection Time: 10/27/23  4:35 AM  Result  Value Ref Range   Vancomycin  Pk 4 (L) 30 - 40 ug/mL    Comment: Performed at [facility] [facility], [address] [place], [place] [address]  CK     Status: Abnormal   Collection Time: 10/27/23  4:35 AM  Result Value Ref Range   Total CK 35 (L) 38 - 234 U/L    Comment: Performed at [facility], [address] [place], [place] [address]  Glucose, capillary     Status: Abnormal   Collection Time: 10/27/23  7:33 AM  Result Value Ref Range   Glucose-Capillary 190 (H) 70 - 99 mg/dL    Comment: Glucose reference range applies only to samples taken after fasting for at least 8 hours.  Vancomycin , peak     Status: Abnormal   Collection Time: 10/27/23  8:11 AM  Result Value Ref Range   Vancomycin  Pk <4 (L) 30 - 40 ug/mL    Comment: RESULT CONFIRMED BY MANUAL DILUTION MW Performed at [facility], [address] [place], [place] [address]   Glucose, capillary     Status: Abnormal   Collection Time: 10/27/23 11:52 AM  Result Value Ref Range   Glucose-Capillary 173 (H) 70 - 99 mg/dL    Comment: Glucose reference range applies only to samples taken after fasting for at least 8 hours.  Pregnancy, urine  POC     Status: None   Collection Time: 10/27/23 12:44 PM  Result Value Ref Range   Preg Test, Ur NEGATIVE NEGATIVE    Comment:        THE SENSITIVITY OF THIS METHODOLOGY IS >24 mIU/mL   Aerobic/Anaerobic Culture w Gram Stain (surgical/deep wound)     Status: None   Collection Time: 10/27/23  1:18 PM   Specimen: Joint, Other; Body Fluid  Result Value Ref Range   Specimen Description      FLUID Performed at [facility], [address] [place], [place] [address]    Special Requests       JOINT OTHER LEFT FOOT SEPTIC ARTHRITIS Performed at [facility], [address] [place], [place] [address]    Gram Stain NO WBC SEEN NO ORGANISMS SEEN     Culture      No growth aerobically or anaerobically. Performed at [facility], [address], [place], [place] [address]    Report Status 11/01/2023 FINAL   Aerobic/Anaerobic Culture w Gram Stain (surgical/deep wound)     Status: None   Collection Time: 10/27/23  1:45 PM   Specimen: Bone; Tissue  Result Value Ref Range   Specimen Description      TISSUE Performed at [facility], [address], [place], [place] [address]    Special Requests       BONE LEFT FOOT SEPTIC ARTHRITIS Performed at [facility], [address] [place], [place] [address]    Gram Stain NO WBC SEEN NO ORGANISMS SEEN     Culture      No growth aerobically or anaerobically. Performed at [facility], [address] [place], [place] [address]    Report Status 11/01/2023 FINAL   Aerobic/Anaerobic Culture w Gram Stain (surgical/deep wound)     Status: None   Collection Time: 10/27/23  2:00 PM   Specimen: Synovium; Body Fluid  Result Value Ref Range   Specimen Description      SYNOVIAL FLUID Performed at [facility], [address] [place], [place] [address]  Special Requests      NONE Performed at [facility], [address] [place], [place] [address]    Gram Stain       ABUNDANT WBC PRESENT, PREDOMINANTLY PMN NO ORGANISMS SEEN    Culture      No growth aerobically or anaerobically. Performed at [facility], [address], [place], [place] [address]    Report Status 11/01/2023 FINAL   Synovial cell count + diff, w/ crystals     Status: Abnormal   Collection Time: 10/27/23  2:00 PM  Result Value Ref Range   Color, Synovial RED (A) YELLOW   Appearance-Synovial TURBID (A) CLEAR   Crystals, Fluid NO CRYSTALS SEEN    WBC, Synovial UNABLE TO PERFORM COUNT DUE TO CLOT IN SPECIMEN 0 - 200 /cu mm    Comment: Performed at [facility], [address] [place], [place] [address]  Glucose, capillary     Status: Abnormal   Collection Time: 10/27/23  4:24 PM  Result Value Ref Range   Glucose-Capillary 206 (H) 70 - 99 mg/dL    Comment: Glucose reference range applies only to samples taken after fasting for at least 8 hours.   Comment 1 Notify RN   Glucose, capillary     Status: Abnormal   Collection Time: 10/27/23  9:44 PM  Result Value Ref Range   Glucose-Capillary 497 (H) 70 - 99 mg/dL    Comment: Glucose reference range applies only to samples taken after fasting for at least 8 hours.   Comment 1 Notify RN   Glucose, capillary     Status: Abnormal   Collection Time: 10/27/23 11:19 PM  Result Value Ref Range   Glucose-Capillary 568 (HH) 70 - 99 mg/dL    Comment: Glucose reference range applies only to samples taken after fasting for at least 8 hours.   Comment 1 Notify RN   Comprehensive metabolic panel     Status: Abnormal   Collection Time: 10/28/23 12:08 AM  Result Value Ref Range   Sodium 129 (L) 135 - 145 mmol/L   Potassium 4.5 3.5 - 5.1 mmol/L   Chloride 93 (L) 98 - 111 mmol/L   CO2 22 22 - 32 mmol/L   Glucose, Bld 502 (HH) 70 - 99 mg/dL    Comment: CRITICAL RESULT CALLED TO, READ BACK BY AND VERIFIED WITH Clavender Monpleh @0056  on 10/28/23 skl Glucose reference range applies only to samples taken after fasting for at least 8  hours.    BUN 12 6 - 20 mg/dL   Creatinine, Ser 9.28 0.44 - 1.00 mg/dL   Calcium 8.4 (L) 8.9 - 10.3 mg/dL   Total Protein 6.2 (L) 6.5 - 8.1 g/dL   Albumin 2.4 (L) 3.5 - 5.0 g/dL   AST 32 15 - 41 U/L   ALT 19 0 - 44 U/L   Alkaline Phosphatase 137 (H) 38 - 126 U/L   Total Bilirubin 0.8 0.0 - 1.2 mg/dL   GFR, Estimated >39 >39 mL/min    Comment: (NOTE) Calculated using the CKD-EPI Creatinine Equation (2021)    Anion gap 14 5 - 15    Comment: Performed at [facility], [address] [place], [place] [address]  Glucose, capillary     Status: Abnormal   Collection Time: 10/28/23 12:23 AM  Result Value Ref Range   Glucose-Capillary 492 (H) 70 - 99 mg/dL    Comment: Glucose reference range applies only to samples taken after fasting for at least  8 hours.  Glucose, capillary     Status: Abnormal   Collection Time: 10/28/23  1:36 AM  Result Value Ref Range   Glucose-Capillary 248 (H) 70 - 99 mg/dL    Comment: Glucose reference range applies only to samples taken after fasting for at least 8 hours.  Glucose, capillary     Status: Abnormal   Collection Time: 10/28/23  3:01 AM  Result Value Ref Range   Glucose-Capillary 135 (H) 70 - 99 mg/dL    Comment: Glucose reference range applies only to samples taken after fasting for at least 8 hours.   Comment 1 Notify RN   Sedimentation rate     Status: Abnormal   Collection Time: 10/28/23  4:19 AM  Result Value Ref Range   Sed Rate 66 (H) 0 - 20 mm/hr    Comment: Performed at [facility], [address] [place], [place] [address]  C-reactive protein     Status: Abnormal   Collection Time: 10/28/23  4:19 AM  Result Value Ref Range   CRP 18.5 (H) <1.0 mg/dL    Comment: Performed at [facility], [address] [place], [place] [address]  Basic metabolic panel     Status: Abnormal   Collection Time: 10/28/23  4:19 AM  Result Value Ref Range   Sodium 133 (L) 135 - 145 mmol/L   Potassium 3.8 3.5 - 5.1 mmol/L    Chloride 97 (L) 98 - 111 mmol/L   CO2 26 22 - 32 mmol/L   Glucose, Bld 116 (H) 70 - 99 mg/dL    Comment: Glucose reference range applies only to samples taken after fasting for at least 8 hours.   BUN 13 6 - 20 mg/dL   Creatinine, Ser 9.46 0.44 - 1.00 mg/dL   Calcium 8.7 (L) 8.9 - 10.3 mg/dL   GFR, Estimated >39 >39 mL/min    Comment: (NOTE) Calculated using the CKD-EPI Creatinine Equation (2021)    Anion gap 10 5 - 15    Comment: Performed at [facility], [address] [place], [place] [address]  CBC     Status: Abnormal   Collection Time: 10/28/23  4:19 AM  Result Value Ref Range   WBC 16.3 (H) 4.0 - 10.5 K/uL   RBC 3.29 (L) 3.87 - 5.11 MIL/uL   Hemoglobin 11.0 (L) 12.0 - 15.0 g/dL   HCT 67.8 (L) 63.9 - 53.9 %   MCV 97.6 80.0 - 100.0 fL   MCH 33.4 26.0 - 34.0 pg   MCHC 34.3 30.0 - 36.0 g/dL   RDW 86.6 88.4 - 84.4 %   Platelets 511 (H) 150 - 400 K/uL   nRBC 0.0 0.0 - 0.2 %    Comment: Performed at [facility], [address], [place], [place] [address]  Magnesium      Status: None   Collection Time: 10/28/23  4:19 AM  Result Value Ref Range   Magnesium  2.0 1.7 - 2.4 mg/dL    Comment: Performed at [facility], [address], [place], [place] [address]  Phosphorus     Status: None   Collection Time: 10/28/23  4:19 AM  Result Value Ref Range   Phosphorus 2.7 2.5 - 4.6 mg/dL    Comment: Performed at [facility], [address] [place], [place] [address]  Glucose, capillary     Status: Abnormal   Collection Time: 10/28/23  5:14 AM  Result Value Ref Range   Glucose-Capillary 135 (H) 70 -  99 mg/dL    Comment: Glucose reference range applies only to samples taken after fasting for at least 8 hours.  Glucose, capillary     Status: Abnormal   Collection Time: 10/28/23  7:32 AM  Result Value Ref Range   Glucose-Capillary 183 (H) 70 - 99 mg/dL    Comment: Glucose reference range applies only to samples taken after fasting for at least  8 hours.  Glucose, capillary     Status: Abnormal   Collection Time: 10/28/23 11:51 AM  Result Value Ref Range   Glucose-Capillary 323 (H) 70 - 99 mg/dL    Comment: Glucose reference range applies only to samples taken after fasting for at least 8 hours.  Glucose, capillary     Status: Abnormal   Collection Time: 10/28/23  3:56 PM  Result Value Ref Range   Glucose-Capillary 104 (H) 70 - 99 mg/dL    Comment: Glucose reference range applies only to samples taken after fasting for at least 8 hours.  Glucose, capillary     Status: Abnormal   Collection Time: 10/28/23  9:18 PM  Result Value Ref Range   Glucose-Capillary 261 (H) 70 - 99 mg/dL    Comment: Glucose reference range applies only to samples taken after fasting for at least 8 hours.  Basic metabolic panel     Status: Abnormal   Collection Time: 10/29/23  4:16 AM  Result Value Ref Range   Sodium 132 (L) 135 - 145 mmol/L   Potassium 4.0 3.5 - 5.1 mmol/L   Chloride 95 (L) 98 - 111 mmol/L   CO2 25 22 - 32 mmol/L   Glucose, Bld 221 (H) 70 - 99 mg/dL    Comment: Glucose reference range applies only to samples taken after fasting for at least 8 hours.   BUN 7 6 - 20 mg/dL   Creatinine, Ser 9.52 0.44 - 1.00 mg/dL   Calcium 8.5 (L) 8.9 - 10.3 mg/dL   GFR, Estimated >39 >39 mL/min    Comment: (NOTE) Calculated using the CKD-EPI Creatinine Equation (2021)    Anion gap 12 5 - 15    Comment: Performed at [facility], [address] [place], [place] [address]  CBC     Status: Abnormal   Collection Time: 10/29/23  4:16 AM  Result Value Ref Range   WBC 14.9 (H) 4.0 - 10.5 K/uL   RBC 3.18 (L) 3.87 - 5.11 MIL/uL   Hemoglobin 10.6 (L) 12.0 - 15.0 g/dL   HCT 69.2 (L) 63.9 - 53.9 %   MCV 96.5 80.0 - 100.0 fL   MCH 33.3 26.0 - 34.0 pg   MCHC 34.5 30.0 - 36.0 g/dL   RDW 86.7 88.4 - 84.4 %   Platelets 659 (H) 150 - 400 K/uL   nRBC 0.0 0.0 - 0.2 %    Comment: Performed at [facility], [address] [place],  [place] [address]  Magnesium      Status: None   Collection Time: 10/29/23  4:16 AM  Result Value Ref Range   Magnesium  1.9 1.7 - 2.4 mg/dL    Comment: Performed at [facility], [address] [place], [place] [address]  Phosphorus     Status: None   Collection Time: 10/29/23  4:16 AM  Result Value Ref Range   Phosphorus 3.1 2.5 - 4.6 mg/dL    Comment: Performed at [facility], [address] [place], [place] [address]  Glucose, capillary     Status: Abnormal  Collection Time: 10/29/23  7:14 AM  Result Value Ref Range   Glucose-Capillary 253 (H) 70 - 99 mg/dL    Comment: Glucose reference range applies only to samples taken after fasting for at least 8 hours.  ECHO TEE     Status: None   Collection Time: 10/29/23  8:37 AM  Result Value Ref Range   Est EF 55 - 60%   Glucose, capillary     Status: Abnormal   Collection Time: 10/29/23 11:18 AM  Result Value Ref Range   Glucose-Capillary 214 (H) 70 - 99 mg/dL    Comment: Glucose reference range applies only to samples taken after fasting for at least 8 hours.  Glucose, capillary     Status: Abnormal   Collection Time: 10/29/23  3:29 PM  Result Value Ref Range   Glucose-Capillary 259 (H) 70 - 99 mg/dL    Comment: Glucose reference range applies only to samples taken after fasting for at least 8 hours.  Surgical pcr screen     Status: Abnormal   Collection Time: 10/29/23  6:10 PM   Specimen: Nasal Mucosa; Nasal Swab  Result Value Ref Range   MRSA, PCR POSITIVE (A) NEGATIVE    Comment: RESULT CALLED TO, READ BACK BY AND VERIFIED WITH: DRISS ZINE 10/29/23 2100 MU    Staphylococcus aureus POSITIVE (A) NEGATIVE    Comment: (NOTE) The Xpert SA Assay (FDA approved for NASAL specimens in patients 74 years of age and older), is one component of a comprehensive surveillance program. It is not intended to diagnose infection nor to guide or monitor treatment. Performed at [facility], [address]  [place], [place] [address]   Glucose, capillary     Status: Abnormal   Collection Time: 10/29/23  9:11 PM  Result Value Ref Range   Glucose-Capillary 178 (H) 70 - 99 mg/dL    Comment: Glucose reference range applies only to samples taken after fasting for at least 8 hours.  Surgical pathology     Status: None   Collection Time: 10/30/23 12:00 AM  Result Value Ref Range   SURGICAL PATHOLOGY      SURGICAL PATHOLOGY [facility] [address] [place], [place] [address] Telephone [phone] or [phone] Fax [phone]  REPORT OF SURGICAL PATHOLOGY   Accession #: [id number] Patient Name: EVOLETT, SOMARRIBA Visit # : [id number]  MRN: [id number] Physician: Malvin Blunt DOB/Age 11/08/1975 (Age: 55) Gender: F Collected Date: 10/30/2023 Received Date: 10/30/2023  FINAL DIAGNOSIS       1. Toe(s), amputation, Left first proximal phalanx :       - ACUTE OSTEOMYELITIS INVOLVING THE INKED DISTAL MARGIN.       2. Toe(s), amputation, Left first metatarsal head :       - ACUTE OSTEOMYELITIS.      - PROXIMAL MARGIN WITH INKED CUT SURFACE OF BONE; CHRONIC INACTIVE OSTEOMYELITIS      IS PRESENT.       ELECTRONIC SIGNATURE : Rubinas Md, Rexene , Sports Administrator, Electronic Signature  MICROSCOPIC DESCRIPTION  CASE COMMENTS STAINS USED IN DIAGNOSIS: H&E H&E H&E    CLINICAL HISTORY  SPECIMEN(S) OBTAINED 1. Toe(s), am putation, Left First Proximal Phalanx 2. Toe(s), amputation, Left First Metatarsal Head  SPECIMEN COMMENTS: 1. Distal Left foot margin ink 2. Proximal margin ink SPECIMEN CLINICAL INFORMATION: 1. Left foot septic arthritis    Gross Description 1. Specimen: Left first proximal phalanx; received fresh and placed in formalin  Orientation: Distal end inked (per requisition), over inked black      Dimensions: 2.8 x 2.1 x 1.2 cm      Distal margin: Grossly viable; smooth, flat, firm.      Proximal end: Smooth, tan-white,  disarticulated surface with a incomplete      pink-red rim.      Cut surface: Tan-white and firm without softening or hemorrhage.      Block summary: Representative, perpendicular sections are submitted in 1 block      (1A) and placed in decalcification solution 2. Specimen: Left first metatarsal head; received fresh and placed in formalin      Orientation: Proximal end inked (per requisition), over inked black      Dimensions: 2.6 x 1.7 cm, 2 .8 cm long      Proximal margin: Grossly viable; smooth, flat, firm; 0.2-0.4 cm cortical rim      Distal end: Smooth, tan-white, disarticulated surface with a mild amount of      pink-tan, soft tissue..      Cut surface: Tan-yellow, trabeculated, and firm without softening or hemorrhage.      Block summary: Representative, perpendicular sections are submitted in 2 block      (2A-B) and placed in decalcification solution      AMG 01.16.2025        Report signed out from the following location(s) [place] [facility] [address] [place], [place] [address] CLIA #: [id number]  [facility] [address] [place], [place] [address] CLIA #: [id number]   Basic metabolic panel     Status: Abnormal   Collection Time: 10/30/23  5:43 AM  Result Value Ref Range   Sodium 132 (L) 135 - 145 mmol/L   Potassium 3.8 3.5 - 5.1 mmol/L   Chloride 97 (L) 98 - 111 mmol/L   CO2 25 22 - 32 mmol/L   Glucose, Bld 215 (H) 70 - 99 mg/dL    Comment: Glucose reference range applies only to samples taken after fasting for at least 8 hours.   BUN 8 6 - 20 mg/dL   Creatinine, Ser 9.49 0.44 - 1.00 mg/dL   Calcium 8.8 (L) 8.9 - 10.3 mg/dL   GFR, Estimated >39 >39 mL/min    Comment: (NOTE) Calculated using the CKD-EPI Creatinine Equation (2021)    Anion gap 10 5 - 15    Comment: Performed at [facility], [address] [place], [place] [address]  CBC     Status: Abnormal   Collection Time: 10/30/23  5:43 AM  Result Value  Ref Range   WBC 14.2 (H) 4.0 - 10.5 K/uL   RBC 3.26 (L) 3.87 - 5.11 MIL/uL   Hemoglobin 10.8 (L) 12.0 - 15.0 g/dL   HCT 67.7 (L) 63.9 - 53.9 %   MCV 98.8 80.0 - 100.0 fL   MCH 33.1 26.0 - 34.0 pg   MCHC 33.5 30.0 - 36.0 g/dL   RDW 86.3 88.4 - 84.4 %   Platelets 761 (H) 150 - 400 K/uL   nRBC 0.0 0.0 - 0.2 %    Comment: Performed at [facility], [address], [place], [place] [address]  Magnesium      Status: None   Collection Time: 10/30/23  5:43 AM  Result Value Ref Range   Magnesium  2.1 1.7 - 2.4 mg/dL    Comment: Performed at [facility], [address], [place], [place] [address]  Phosphorus     Status: None  Collection Time: 10/30/23  5:43 AM  Result Value Ref Range   Phosphorus 3.2 2.5 - 4.6 mg/dL    Comment: Performed at [facility], [address] [place], [place] [address]  Culture, blood (Routine X 2) w Reflex to ID Panel     Status: None   Collection Time: 10/30/23  5:43 AM   Specimen: BLOOD RIGHT ARM  Result Value Ref Range   Specimen Description BLOOD RIGHT ARM    Special Requests      BOTTLES DRAWN AEROBIC AND ANAEROBIC Blood Culture results may not be optimal due to an inadequate volume of blood received in culture bottles   Culture      NO GROWTH 5 DAYS Performed at [facility], [address], [place], [place] [address]    Report Status 11/04/2023 FINAL   Osmolality     Status: None   Collection Time: 10/30/23  5:43 AM  Result Value Ref Range   Osmolality 293 275 - 295 mOsm/kg    Comment: REPEATED TO VERIFY Performed at [facility], [address] [place], [place] [address]   Culture, blood (Routine X 2) w Reflex to ID Panel     Status: None   Collection Time: 10/30/23  5:48 AM   Specimen: BLOOD RIGHT HAND  Result Value Ref Range   Specimen Description BLOOD RIGHT HAND    Special Requests      BOTTLES DRAWN AEROBIC ONLY Blood Culture results may not be optimal due to an inadequate volume of blood  received in culture bottles   Culture      NO GROWTH 5 DAYS Performed at [facility], [address] [place], [place] [address]    Report Status 11/04/2023 FINAL   Glucose, capillary     Status: Abnormal   Collection Time: 10/30/23  6:53 AM  Result Value Ref Range   Glucose-Capillary 190 (H) 70 - 99 mg/dL    Comment: Glucose reference range applies only to samples taken after fasting for at least 8 hours.  Aerobic/Anaerobic Culture w Gram Stain (surgical/deep wound)     Status: None   Collection Time: 10/30/23  7:46 AM   Specimen: Bone; Tissue  Result Value Ref Range   Specimen Description      BONE Performed at [facility], [address], [place], [place] [address]    Special Requests      LEFT FIRST METATARSAL HEAD Performed at [facility], [address] [place], [place] [address]    Gram Stain NO WBC SEEN NO ORGANISMS SEEN     Culture      NO ANAEROBES ISOLATED Performed at [facility], [address] [place], [place] [address]    Report Status 11/04/2023 FINAL   Glucose, capillary     Status: Abnormal   Collection Time: 10/30/23 11:54 AM  Result Value Ref Range   Glucose-Capillary 341 (H) 70 - 99 mg/dL    Comment: Glucose reference range applies only to samples taken after fasting for at least 8 hours.   Comment 1 Notify RN    Comment 2 Document in Chart   Glucose, capillary     Status: Abnormal   Collection Time: 10/30/23  4:36 PM  Result Value Ref Range   Glucose-Capillary 195 (H) 70 - 99 mg/dL    Comment: Glucose reference range applies only to samples taken after fasting for at least 8 hours.  Glucose, capillary     Status: Abnormal  Collection Time: 10/30/23  8:19 PM  Result Value Ref Range   Glucose-Capillary 209 (H) 70 - 99 mg/dL    Comment: Glucose reference range applies only to samples taken after fasting for at least 8 hours.  Basic metabolic panel     Status: Abnormal   Collection Time: 10/31/23  5:47 AM   Result Value Ref Range   Sodium 130 (L) 135 - 145 mmol/L   Potassium 4.4 3.5 - 5.1 mmol/L   Chloride 99 98 - 111 mmol/L   CO2 24 22 - 32 mmol/L   Glucose, Bld 287 (H) 70 - 99 mg/dL    Comment: Glucose reference range applies only to samples taken after fasting for at least 8 hours.   BUN 10 6 - 20 mg/dL   Creatinine, Ser 9.54 0.44 - 1.00 mg/dL   Calcium 8.7 (L) 8.9 - 10.3 mg/dL   GFR, Estimated >39 >39 mL/min    Comment: (NOTE) Calculated using the CKD-EPI Creatinine Equation (2021)    Anion gap 7 5 - 15    Comment: Performed at [facility], [address] [place], [place] [address]  CBC     Status: Abnormal   Collection Time: 10/31/23  5:47 AM  Result Value Ref Range   WBC 10.4 4.0 - 10.5 K/uL   RBC 2.92 (L) 3.87 - 5.11 MIL/uL   Hemoglobin 9.6 (L) 12.0 - 15.0 g/dL   HCT 70.7 (L) 63.9 - 53.9 %   MCV 100.0 80.0 - 100.0 fL   MCH 32.9 26.0 - 34.0 pg   MCHC 32.9 30.0 - 36.0 g/dL   RDW 86.3 88.4 - 84.4 %   Platelets 668 (H) 150 - 400 K/uL   nRBC 0.0 0.0 - 0.2 %    Comment: Performed at [facility], [address], [place], [place] [address]  Magnesium      Status: None   Collection Time: 10/31/23  5:47 AM  Result Value Ref Range   Magnesium  2.0 1.7 - 2.4 mg/dL    Comment: Performed at [facility], [address] [place], [place] [address]  Phosphorus     Status: None   Collection Time: 10/31/23  5:47 AM  Result Value Ref Range   Phosphorus 2.9 2.5 - 4.6 mg/dL    Comment: Performed at [facility], [address] [place], [place] [address]  Glucose, capillary     Status: Abnormal   Collection Time: 10/31/23  8:42 AM  Result Value Ref Range   Glucose-Capillary 218 (H) 70 - 99 mg/dL    Comment: Glucose reference range applies only to samples taken after fasting for at least 8 hours.  Glucose, capillary     Status: Abnormal   Collection Time: 10/31/23 11:33 AM  Result Value Ref Range   Glucose-Capillary 305 (H) 70 - 99 mg/dL     Comment: Glucose reference range applies only to samples taken after fasting for at least 8 hours.   Comment 1 Notify RN    Comment 2 Document in Chart   Glucose, capillary     Status: Abnormal   Collection Time: 10/31/23  4:28 PM  Result Value Ref Range   Glucose-Capillary 205 (H) 70 - 99 mg/dL    Comment: Glucose reference range applies only to samples taken after fasting for at least 8 hours.   Comment 1 Notify RN    Comment 2 Document in Chart   Glucose, capillary     Status: Abnormal   Collection Time: 10/31/23  9:06 PM  Result Value Ref Range   Glucose-Capillary 299 (H) 70 - 99 mg/dL    Comment: Glucose reference range applies only to samples taken after fasting for at least 8 hours.  Basic metabolic panel     Status: Abnormal   Collection Time: 11/01/23  4:07 AM  Result Value Ref Range   Sodium 130 (L) 135 - 145 mmol/L   Potassium 4.7 3.5 - 5.1 mmol/L   Chloride 100 98 - 111 mmol/L   CO2 24 22 - 32 mmol/L   Glucose, Bld 260 (H) 70 - 99 mg/dL    Comment: Glucose reference range applies only to samples taken after fasting for at least 8 hours.   BUN 8 6 - 20 mg/dL   Creatinine, Ser 9.57 (L) 0.44 - 1.00 mg/dL   Calcium 8.7 (L) 8.9 - 10.3 mg/dL   GFR, Estimated >39 >39 mL/min    Comment: (NOTE) Calculated using the CKD-EPI Creatinine Equation (2021)    Anion gap 6 5 - 15    Comment: Performed at [facility], [address] [place], [place] [address]  CBC     Status: Abnormal   Collection Time: 11/01/23  4:07 AM  Result Value Ref Range   WBC 10.6 (H) 4.0 - 10.5 K/uL   RBC 2.67 (L) 3.87 - 5.11 MIL/uL   Hemoglobin 8.7 (L) 12.0 - 15.0 g/dL   HCT 73.8 (L) 63.9 - 53.9 %   MCV 97.8 80.0 - 100.0 fL   MCH 32.6 26.0 - 34.0 pg   MCHC 33.3 30.0 - 36.0 g/dL   RDW 86.5 88.4 - 84.4 %   Platelets 697 (H) 150 - 400 K/uL   nRBC 0.0 0.0 - 0.2 %    Comment: Performed at [facility], [address] [place], [place] [address]  Magnesium      Status: None    Collection Time: 11/01/23  4:07 AM  Result Value Ref Range   Magnesium  2.1 1.7 - 2.4 mg/dL    Comment: Performed at [facility], [address], [place], [place] [address]  Phosphorus     Status: None   Collection Time: 11/01/23  4:07 AM  Result Value Ref Range   Phosphorus 3.4 2.5 - 4.6 mg/dL    Comment: Performed at [facility], [address] [place], [place] [address]  Glucose, capillary     Status: Abnormal   Collection Time: 11/01/23  7:48 AM  Result Value Ref Range   Glucose-Capillary 222 (H) 70 - 99 mg/dL    Comment: Glucose reference range applies only to samples taken after fasting for at least 8 hours.   Comment 1 Notify RN    Comment 2 Document in Chart   Glucose, capillary     Status: Abnormal   Collection Time: 11/01/23 12:26 PM  Result Value Ref Range   Glucose-Capillary 284 (H) 70 - 99 mg/dL    Comment: Glucose reference range applies only to samples taken after fasting for at least 8 hours.   Comment 1 Notify RN    Comment 2 Document in Chart   Glucose, capillary     Status: Abnormal   Collection Time: 11/01/23  4:58 PM  Result Value Ref Range   Glucose-Capillary 169 (H) 70 - 99 mg/dL    Comment: Glucose reference range applies only to samples taken after fasting for at least 8 hours.  Basic metabolic panel     Status: Abnormal   Collection Time: 11/02/23  3:53  AM  Result Value Ref Range   Sodium 129 (L) 135 - 145 mmol/L   Potassium 4.6 3.5 - 5.1 mmol/L   Chloride 99 98 - 111 mmol/L   CO2 21 (L) 22 - 32 mmol/L   Glucose, Bld 181 (H) 70 - 99 mg/dL    Comment: Glucose reference range applies only to samples taken after fasting for at least 8 hours.   BUN 12 6 - 20 mg/dL   Creatinine, Ser 9.49 0.44 - 1.00 mg/dL   Calcium 8.9 8.9 - 89.6 mg/dL   GFR, Estimated >39 >39 mL/min    Comment: (NOTE) Calculated using the CKD-EPI Creatinine Equation (2021)    Anion gap 9 5 - 15    Comment: Performed at [facility], [address] [place], [place] [address]  CBC     Status: Abnormal   Collection Time: 11/02/23  3:53 AM  Result Value Ref Range   WBC 10.0 4.0 - 10.5 K/uL   RBC 2.78 (L) 3.87 - 5.11 MIL/uL   Hemoglobin 9.2 (L) 12.0 - 15.0 g/dL   HCT 72.6 (L) 63.9 - 53.9 %   MCV 98.2 80.0 - 100.0 fL   MCH 33.1 26.0 - 34.0 pg   MCHC 33.7 30.0 - 36.0 g/dL   RDW 86.7 88.4 - 84.4 %   Platelets 739 (H) 150 - 400 K/uL   nRBC 0.0 0.0 - 0.2 %    Comment: Performed at [facility], [address], [place], [place] [address]  Magnesium      Status: None   Collection Time: 11/02/23  3:53 AM  Result Value Ref Range   Magnesium  2.1 1.7 - 2.4 mg/dL    Comment: Performed at [facility], [address], [place], [place] [address]  Phosphorus     Status: None   Collection Time: 11/02/23  3:53 AM  Result Value Ref Range   Phosphorus 4.3 2.5 - 4.6 mg/dL    Comment: Performed at [facility], [address] [place], [place] [address]  Glucose, capillary     Status: Abnormal   Collection Time: 11/02/23  7:55 AM  Result Value Ref Range   Glucose-Capillary 213 (H) 70 - 99 mg/dL    Comment: Glucose reference range applies only to samples taken after fasting for at least 8 hours.   Comment 1 Notify RN    Comment 2 Document in Chart   Glucose, capillary     Status: Abnormal   Collection Time: 11/02/23 12:05 PM  Result Value Ref Range   Glucose-Capillary 349 (H) 70 - 99 mg/dL    Comment: Glucose reference range applies only to samples taken after fasting for at least 8 hours.  Glucose, capillary     Status: Abnormal   Collection Time: 11/02/23  4:49 PM  Result Value Ref Range   Glucose-Capillary 125 (H) 70 - 99 mg/dL    Comment: Glucose reference range applies only to samples taken after fasting for at least 8 hours.  Glucose, capillary     Status: Abnormal   Collection Time: 11/02/23  9:40 PM  Result Value Ref Range   Glucose-Capillary 101 (H) 70 - 99 mg/dL    Comment: Glucose reference range  applies only to samples taken after fasting for at least 8 hours.  Glucose, capillary     Status: Abnormal   Collection Time: 11/03/23  4:04 AM  Result Value Ref Range   Glucose-Capillary 138 (H) 70 - 99 mg/dL    Comment:  Glucose reference range applies only to samples taken after fasting for at least 8 hours.  CK     Status: Abnormal   Collection Time: 11/03/23  6:40 AM  Result Value Ref Range   Total CK 14 (L) 38 - 234 U/L    Comment: Performed at [facility], [address] [place], [place] [address]  Basic metabolic panel     Status: Abnormal   Collection Time: 11/03/23  6:40 AM  Result Value Ref Range   Sodium 134 (L) 135 - 145 mmol/L   Potassium 4.8 3.5 - 5.1 mmol/L   Chloride 103 98 - 111 mmol/L   CO2 23 22 - 32 mmol/L   Glucose, Bld 151 (H) 70 - 99 mg/dL    Comment: Glucose reference range applies only to samples taken after fasting for at least 8 hours.   BUN 12 6 - 20 mg/dL   Creatinine, Ser 9.52 0.44 - 1.00 mg/dL   Calcium 8.9 8.9 - 89.6 mg/dL   GFR, Estimated >39 >39 mL/min    Comment: (NOTE) Calculated using the CKD-EPI Creatinine Equation (2021)    Anion gap 8 5 - 15    Comment: Performed at [facility], [address] [place], [place] [address]  CBC     Status: Abnormal   Collection Time: 11/03/23  6:40 AM  Result Value Ref Range   WBC 10.1 4.0 - 10.5 K/uL   RBC 2.79 (L) 3.87 - 5.11 MIL/uL   Hemoglobin 9.1 (L) 12.0 - 15.0 g/dL   HCT 72.6 (L) 63.9 - 53.9 %   MCV 97.8 80.0 - 100.0 fL   MCH 32.6 26.0 - 34.0 pg   MCHC 33.3 30.0 - 36.0 g/dL   RDW 86.7 88.4 - 84.4 %   Platelets 758 (H) 150 - 400 K/uL   nRBC 0.0 0.0 - 0.2 %    Comment: Performed at [facility], [address], [place], [place] [address]  Magnesium      Status: None   Collection Time: 11/03/23  6:40 AM  Result Value Ref Range   Magnesium  2.1 1.7 - 2.4 mg/dL    Comment: Performed at [facility], [address] [place], [place] [address]  Phosphorus      Status: Abnormal   Collection Time: 11/03/23  6:40 AM  Result Value Ref Range   Phosphorus 5.0 (H) 2.5 - 4.6 mg/dL    Comment: Performed at [facility], [address] [place], [place] [address]  Glucose, capillary     Status: Abnormal   Collection Time: 11/03/23  8:07 AM  Result Value Ref Range   Glucose-Capillary 175 (H) 70 - 99 mg/dL    Comment: Glucose reference range applies only to samples taken after fasting for at least 8 hours.   Comment 1 Notify RN    Comment 2 Document in Chart   Glucose, capillary     Status: Abnormal   Collection Time: 11/03/23 11:12 AM  Result Value Ref Range   Glucose-Capillary 125 (H) 70 - 99 mg/dL    Comment: Glucose reference range applies only to samples taken after fasting for at least 8 hours.   Comment 1 Notify RN    Comment 2 Document in Chart   Glucose, capillary     Status: Abnormal   Collection Time: 11/03/23  4:36 PM  Result Value Ref Range   Glucose-Capillary 198 (H) 70 - 99 mg/dL    Comment: Glucose reference range applies only to samples  taken after fasting for at least 8 hours.  Glucose, capillary     Status: Abnormal   Collection Time: 11/03/23  7:46 PM  Result Value Ref Range   Glucose-Capillary 177 (H) 70 - 99 mg/dL    Comment: Glucose reference range applies only to samples taken after fasting for at least 8 hours.  Basic metabolic panel     Status: Abnormal   Collection Time: 11/04/23  4:32 AM  Result Value Ref Range   Sodium 133 (L) 135 - 145 mmol/L   Potassium 5.1 3.5 - 5.1 mmol/L   Chloride 100 98 - 111 mmol/L   CO2 24 22 - 32 mmol/L   Glucose, Bld 122 (H) 70 - 99 mg/dL    Comment: Glucose reference range applies only to samples taken after fasting for at least 8 hours.   BUN 16 6 - 20 mg/dL   Creatinine, Ser 9.35 0.44 - 1.00 mg/dL   Calcium 9.4 8.9 - 89.6 mg/dL   GFR, Estimated >39 >39 mL/min    Comment: (NOTE) Calculated using the CKD-EPI Creatinine Equation (2021)    Anion gap 9 5 - 15    Comment:  Performed at [facility], [address] [place], [place] [address]  CBC     Status: Abnormal   Collection Time: 11/04/23  4:32 AM  Result Value Ref Range   WBC 8.9 4.0 - 10.5 K/uL   RBC 3.03 (L) 3.87 - 5.11 MIL/uL   Hemoglobin 9.9 (L) 12.0 - 15.0 g/dL   HCT 69.6 (L) 63.9 - 53.9 %   MCV 100.0 80.0 - 100.0 fL   MCH 32.7 26.0 - 34.0 pg   MCHC 32.7 30.0 - 36.0 g/dL   RDW 86.6 88.4 - 84.4 %   Platelets 837 (H) 150 - 400 K/uL   nRBC 0.0 0.0 - 0.2 %    Comment: Performed at [facility], [address], [place], [place] [address]  Magnesium      Status: None   Collection Time: 11/04/23  4:32 AM  Result Value Ref Range   Magnesium  2.1 1.7 - 2.4 mg/dL    Comment: Performed at [facility], [address], [place], [place] [address]  Phosphorus     Status: Abnormal   Collection Time: 11/04/23  4:32 AM  Result Value Ref Range   Phosphorus 5.6 (H) 2.5 - 4.6 mg/dL    Comment: Performed at [facility], [address] [place], [place] [address]  Sedimentation rate     Status: Abnormal   Collection Time: 11/04/23  4:32 AM  Result Value Ref Range   Sed Rate 107 (H) 0 - 20 mm/hr    Comment: Performed at [facility], [address] [place], [place] [address]  C-reactive protein     Status: Abnormal   Collection Time: 11/04/23  4:32 AM  Result Value Ref Range   CRP 4.0 (H) <1.0 mg/dL    Comment: Performed at [facility], [address] [place], [place] [address]  Glucose, capillary     Status: Abnormal   Collection Time: 11/04/23  7:44 AM  Result Value Ref Range   Glucose-Capillary 113 (H) 70 - 99 mg/dL    Comment: Glucose reference range applies only to samples taken after fasting for at least 8 hours.  Glucose, capillary     Status: Abnormal   Collection Time: 11/04/23 12:09 PM  Result Value Ref Range   Glucose-Capillary 192 (H) 70 - 99 mg/dL  Comment: Glucose reference range applies only to samples taken after fasting for at  least 8 hours.  Glucose, capillary     Status: Abnormal   Collection Time: 11/04/23  4:21 PM  Result Value Ref Range   Glucose-Capillary 134 (H) 70 - 99 mg/dL    Comment: Glucose reference range applies only to samples taken after fasting for at least 8 hours.      Assessment & Plan:  Xray of the back today.  Baclofen    Problem List Items Addressed This Visit       Digestive   Acute pancreatitis     Other   Abdominal pain   Acute bilateral back pain - Primary   Relevant Medications   baclofen  (LIORESAL ) 10 MG tablet   Other Relevant Orders   DG Lumbar Spine Complete   DG Thoracic Spine 2 View    Return in about 1 week (around 11/28/2023).   Total time spent: 25 minutes  [employer], NP  11/21/2023   This document may have been prepared by Dragon Voice Recognition software and as such may include unintentional dictation errors.

## 2023-11-23 ENCOUNTER — Emergency Department: Payer: Medicaid Other

## 2023-11-23 ENCOUNTER — Encounter: Payer: Self-pay | Admitting: Intensive Care

## 2023-11-23 ENCOUNTER — Inpatient Hospital Stay
Admission: EM | Admit: 2023-11-23 | Discharge: 2023-11-28 | DRG: 426 | Disposition: A | Discharge status: Home / Self Care | Payer: Medicaid Other | Attending: Obstetrics and Gynecology | Admitting: Obstetrics and Gynecology

## 2023-11-23 ENCOUNTER — Other Ambulatory Visit: Payer: Self-pay

## 2023-11-23 ENCOUNTER — Inpatient Hospital Stay: Payer: Medicaid Other

## 2023-11-23 DIAGNOSIS — B9562 Methicillin resistant Staphylococcus aureus infection as the cause of diseases classified elsewhere: Secondary | ICD-10-CM | POA: Diagnosis not present

## 2023-11-23 DIAGNOSIS — G2581 Restless legs syndrome: Secondary | ICD-10-CM | POA: Diagnosis present

## 2023-11-23 DIAGNOSIS — R590 Localized enlarged lymph nodes: Secondary | ICD-10-CM | POA: Diagnosis not present

## 2023-11-23 DIAGNOSIS — M4640 Discitis, unspecified, site unspecified: Secondary | ICD-10-CM | POA: Diagnosis not present

## 2023-11-23 DIAGNOSIS — A4902 Methicillin resistant Staphylococcus aureus infection, unspecified site: Secondary | ICD-10-CM | POA: Diagnosis not present

## 2023-11-23 DIAGNOSIS — M532X4 Spinal instabilities, thoracic region: Secondary | ICD-10-CM | POA: Diagnosis not present

## 2023-11-23 DIAGNOSIS — M4804 Spinal stenosis, thoracic region: Secondary | ICD-10-CM | POA: Diagnosis not present

## 2023-11-23 DIAGNOSIS — M62838 Other muscle spasm: Secondary | ICD-10-CM | POA: Diagnosis present

## 2023-11-23 DIAGNOSIS — M4854XA Collapsed vertebra, not elsewhere classified, thoracic region, initial encounter for fracture: Secondary | ICD-10-CM | POA: Diagnosis not present

## 2023-11-23 DIAGNOSIS — M19072 Primary osteoarthritis, left ankle and foot: Secondary | ICD-10-CM | POA: Diagnosis present

## 2023-11-23 DIAGNOSIS — Z86718 Personal history of other venous thrombosis and embolism: Secondary | ICD-10-CM

## 2023-11-23 DIAGNOSIS — M00072 Staphylococcal arthritis, left ankle and foot: Secondary | ICD-10-CM | POA: Diagnosis not present

## 2023-11-23 DIAGNOSIS — G062 Extradural and subdural abscess, unspecified: Secondary | ICD-10-CM | POA: Diagnosis not present

## 2023-11-23 DIAGNOSIS — E785 Hyperlipidemia, unspecified: Secondary | ICD-10-CM | POA: Diagnosis present

## 2023-11-23 DIAGNOSIS — D649 Anemia, unspecified: Secondary | ICD-10-CM | POA: Diagnosis not present

## 2023-11-23 DIAGNOSIS — Z888 Allergy status to other drugs, medicaments and biological substances status: Secondary | ICD-10-CM | POA: Diagnosis not present

## 2023-11-23 DIAGNOSIS — J9811 Atelectasis: Secondary | ICD-10-CM | POA: Diagnosis not present

## 2023-11-23 DIAGNOSIS — Z452 Encounter for adjustment and management of vascular access device: Secondary | ICD-10-CM | POA: Diagnosis not present

## 2023-11-23 DIAGNOSIS — G061 Intraspinal abscess and granuloma: Secondary | ICD-10-CM | POA: Diagnosis present

## 2023-11-23 DIAGNOSIS — I10 Essential (primary) hypertension: Secondary | ICD-10-CM | POA: Diagnosis present

## 2023-11-23 DIAGNOSIS — M549 Dorsalgia, unspecified: Secondary | ICD-10-CM | POA: Diagnosis not present

## 2023-11-23 DIAGNOSIS — D75838 Other thrombocytosis: Secondary | ICD-10-CM | POA: Diagnosis not present

## 2023-11-23 DIAGNOSIS — Z7984 Long term (current) use of oral hypoglycemic drugs: Secondary | ICD-10-CM

## 2023-11-23 DIAGNOSIS — R0789 Other chest pain: Secondary | ICD-10-CM | POA: Diagnosis not present

## 2023-11-23 DIAGNOSIS — F1011 Alcohol abuse, in remission: Secondary | ICD-10-CM | POA: Diagnosis present

## 2023-11-23 DIAGNOSIS — F17290 Nicotine dependence, other tobacco product, uncomplicated: Secondary | ICD-10-CM | POA: Diagnosis not present

## 2023-11-23 DIAGNOSIS — J4520 Mild intermittent asthma, uncomplicated: Secondary | ICD-10-CM

## 2023-11-23 DIAGNOSIS — Z79899 Other long term (current) drug therapy: Secondary | ICD-10-CM | POA: Diagnosis not present

## 2023-11-23 DIAGNOSIS — F17210 Nicotine dependence, cigarettes, uncomplicated: Secondary | ICD-10-CM | POA: Diagnosis not present

## 2023-11-23 DIAGNOSIS — M40204 Unspecified kyphosis, thoracic region: Secondary | ICD-10-CM | POA: Diagnosis present

## 2023-11-23 DIAGNOSIS — M4624 Osteomyelitis of vertebra, thoracic region: Secondary | ICD-10-CM | POA: Diagnosis not present

## 2023-11-23 DIAGNOSIS — R079 Chest pain, unspecified: Secondary | ICD-10-CM | POA: Diagnosis not present

## 2023-11-23 DIAGNOSIS — R7881 Bacteremia: Secondary | ICD-10-CM | POA: Diagnosis not present

## 2023-11-23 DIAGNOSIS — J45909 Unspecified asthma, uncomplicated: Secondary | ICD-10-CM | POA: Diagnosis not present

## 2023-11-23 DIAGNOSIS — K859 Acute pancreatitis without necrosis or infection, unspecified: Secondary | ICD-10-CM | POA: Diagnosis not present

## 2023-11-23 DIAGNOSIS — M009 Pyogenic arthritis, unspecified: Secondary | ICD-10-CM | POA: Diagnosis present

## 2023-11-23 DIAGNOSIS — E875 Hyperkalemia: Secondary | ICD-10-CM | POA: Diagnosis present

## 2023-11-23 DIAGNOSIS — M4644 Discitis, unspecified, thoracic region: Secondary | ICD-10-CM | POA: Diagnosis not present

## 2023-11-23 DIAGNOSIS — Z8614 Personal history of Methicillin resistant Staphylococcus aureus infection: Secondary | ICD-10-CM | POA: Diagnosis not present

## 2023-11-23 DIAGNOSIS — I152 Hypertension secondary to endocrine disorders: Secondary | ICD-10-CM | POA: Diagnosis present

## 2023-11-23 DIAGNOSIS — M8448XA Pathological fracture, other site, initial encounter for fracture: Secondary | ICD-10-CM

## 2023-11-23 DIAGNOSIS — M86172 Other acute osteomyelitis, left ankle and foot: Secondary | ICD-10-CM | POA: Diagnosis not present

## 2023-11-23 DIAGNOSIS — K922 Gastrointestinal hemorrhage, unspecified: Secondary | ICD-10-CM | POA: Diagnosis not present

## 2023-11-23 HISTORY — DX: Essential (primary) hypertension: I10

## 2023-11-23 HISTORY — DX: Extradural and subdural abscess, unspecified: G06.2

## 2023-11-23 LAB — TROPONIN I (HIGH SENSITIVITY)
Troponin I (High Sensitivity): 5 ng/L (ref <18)
Troponin I (High Sensitivity): 4 ng/L (ref <18)

## 2023-11-23 LAB — CBC
HCT: 34.7 % — ABNORMAL LOW (ref 36.0–46.0)
Hemoglobin: 11.1 g/dL — ABNORMAL LOW (ref 12.0–15.0)
MCH: 30.6 pg (ref 26.0–34.0)
MCHC: 32 g/dL (ref 30.0–36.0)
MCV: 95.6 fL (ref 80.0–100.0)
Platelets: 730 10*3/uL — ABNORMAL HIGH (ref 150–400)
RBC: 3.63 MIL/uL — ABNORMAL LOW (ref 3.87–5.11)
RDW: 13.5 % (ref 11.5–15.5)
WBC: 13.1 10*3/uL — ABNORMAL HIGH (ref 4.0–10.5)
nRBC: 0.2 % (ref 0.0–0.2)

## 2023-11-23 LAB — HEPATIC FUNCTION PANEL
ALT: 23 U/L (ref 0–44)
AST: 25 U/L (ref 15–41)
Albumin: 4 g/dL (ref 3.5–5.0)
Alkaline Phosphatase: 105 U/L (ref 38–126)
Bilirubin, Direct: 0.2 mg/dL (ref 0.0–0.2)
Indirect Bilirubin: 0.5 mg/dL (ref 0.3–0.9)
Total Bilirubin: 0.7 mg/dL (ref 0.0–1.2)
Total Protein: 8.6 g/dL — ABNORMAL HIGH (ref 6.5–8.1)

## 2023-11-23 LAB — BASIC METABOLIC PANEL
Anion gap: 11 (ref 5–15)
BUN: 20 mg/dL (ref 6–20)
CO2: 19 mmol/L — ABNORMAL LOW (ref 22–32)
Calcium: 10 mg/dL (ref 8.9–10.3)
Chloride: 103 mmol/L (ref 98–111)
Creatinine, Ser: 0.56 mg/dL (ref 0.44–1.00)
GFR, Estimated: >60 mL/min (ref >60)
Glucose, Bld: 176 mg/dL — ABNORMAL HIGH (ref 70–99)
Potassium: 5.6 mmol/L — ABNORMAL HIGH (ref 3.5–5.1)
Sodium: 133 mmol/L — ABNORMAL LOW (ref 135–145)

## 2023-11-23 LAB — LACTIC ACID, PLASMA
Lactic Acid, Venous: 3.4 mmol/L (ref 0.5–1.9)
Lactic Acid, Venous: 2.1 mmol/L (ref 0.5–1.9)

## 2023-11-23 LAB — PROTIME-INR
INR: 1.1 (ref 0.8–1.2)
Prothrombin Time: 14.2 s (ref 11.4–15.2)

## 2023-11-23 LAB — SEDIMENTATION RATE: Sed Rate: 79 mm/h — ABNORMAL HIGH (ref 0–20)

## 2023-11-23 LAB — LIPASE, BLOOD: Lipase: 21 U/L (ref 11–51)

## 2023-11-23 LAB — PROCALCITONIN: Procalcitonin: <0.1 ng/mL

## 2023-11-23 LAB — APTT: aPTT: 31 s (ref 24–36)

## 2023-11-23 MED ORDER — ALBUTEROL SULFATE (2.5 MG/3ML) 0.083% IN NEBU: 2.5000 mg | INHALATION_SOLUTION | RESPIRATORY_TRACT | Status: DC | PRN

## 2023-11-23 MED ORDER — SODIUM CHLORIDE 0.9 % IV SOLN
2.0000 g | Freq: Once | INTRAVENOUS | Status: AC
  Administered 2023-11-23: 2 g via INTRAVENOUS
  Filled 2023-11-23: qty 12.5

## 2023-11-23 MED ORDER — SODIUM CHLORIDE 0.9 % IV SOLN: INTRAVENOUS | Status: AC

## 2023-11-23 MED ORDER — IOHEXOL 350 MG/ML SOLN
75.0000 mL | Freq: Once | INTRAVENOUS | Status: AC | PRN
  Administered 2023-11-23: 75 mL via INTRAVENOUS

## 2023-11-23 MED ORDER — CHLORHEXIDINE GLUCONATE CLOTH 2 % EX PADS
6.0000 | MEDICATED_PAD | Freq: Every day | CUTANEOUS | Status: DC
  Administered 2023-11-24 – 2023-11-28 (×5): 6 via TOPICAL

## 2023-11-23 MED ORDER — MORPHINE SULFATE (PF) 4 MG/ML IV SOLN
4.0000 mg | Freq: Once | INTRAVENOUS | Status: AC
  Administered 2023-11-23: 4 mg via INTRAVENOUS
  Filled 2023-11-23: qty 1

## 2023-11-23 MED ORDER — SODIUM ZIRCONIUM CYCLOSILICATE 10 G PO PACK
10.0000 g | PACK | Freq: Once | ORAL | Status: DC
  Filled 2023-11-23 (×2): qty 1

## 2023-11-23 MED ORDER — LORAZEPAM 2 MG/ML IJ SOLN: 1.0000 mg | INTRAMUSCULAR | Status: DC | PRN

## 2023-11-23 MED ORDER — SODIUM CHLORIDE 0.9 % IV BOLUS
1000.0000 mL | Freq: Once | INTRAVENOUS | Status: AC
  Administered 2023-11-23: 1000 mL via INTRAVENOUS

## 2023-11-23 MED ORDER — OXYCODONE-ACETAMINOPHEN 5-325 MG PO TABS
1.0000 | ORAL_TABLET | ORAL | Status: DC | PRN
  Administered 2023-11-23 – 2023-11-24 (×2): 1 via ORAL
  Filled 2023-11-23 (×2): qty 1

## 2023-11-23 MED ORDER — ONDANSETRON HCL 4 MG/2ML IJ SOLN: 4.0000 mg | Freq: Three times a day (TID) | INTRAMUSCULAR | Status: DC | PRN

## 2023-11-23 MED ORDER — MORPHINE SULFATE (PF) 2 MG/ML IV SOLN
2.0000 mg | INTRAVENOUS | Status: DC | PRN
  Administered 2023-11-24 – 2023-11-25 (×5): 2 mg via INTRAVENOUS
  Filled 2023-11-23 (×5): qty 1

## 2023-11-23 MED ORDER — VANCOMYCIN HCL IN DEXTROSE 1-5 GM/200ML-% IV SOLN
1000.0000 mg | Freq: Once | INTRAVENOUS | Status: AC
  Administered 2023-11-24: 1000 mg via INTRAVENOUS
  Filled 2023-11-23: qty 200

## 2023-11-23 MED ORDER — SODIUM CHLORIDE 0.9% FLUSH
10.0000 mL | Freq: Two times a day (BID) | INTRAVENOUS | Status: DC
Start: 2023-11-24 — End: 2023-11-28
  Administered 2023-11-24: 10 mL
  Administered 2023-11-24: 40 mL
  Administered 2023-11-24 – 2023-11-28 (×8): 10 mL

## 2023-11-23 MED ORDER — GABAPENTIN 100 MG PO CAPS
100.0000 mg | ORAL_CAPSULE | Freq: Three times a day (TID) | ORAL | Status: DC
  Administered 2023-11-23 – 2023-11-28 (×14): 100 mg via ORAL
  Filled 2023-11-23 (×14): qty 1

## 2023-11-23 MED ORDER — GADOBUTROL 1 MMOL/ML IV SOLN
5.0000 mL | Freq: Once | INTRAVENOUS | Status: AC | PRN
  Administered 2023-11-23: 5 mL via INTRAVENOUS

## 2023-11-23 MED ORDER — VANCOMYCIN HCL IN DEXTROSE 1-5 GM/200ML-% IV SOLN: 1000.0000 mg | INTRAVENOUS | Status: DC

## 2023-11-23 MED ORDER — BISACODYL 5 MG PO TBEC: 10.0000 mg | DELAYED_RELEASE_TABLET | Freq: Every evening | ORAL | Status: DC | PRN

## 2023-11-23 MED ORDER — HYDRALAZINE HCL 20 MG/ML IJ SOLN
5.0000 mg | INTRAMUSCULAR | Status: DC | PRN
  Administered 2023-11-26 (×2): 5 mg via INTRAVENOUS
  Filled 2023-11-23 (×2): qty 1

## 2023-11-23 MED ORDER — ACETAMINOPHEN 325 MG PO TABS: 650.0000 mg | ORAL_TABLET | Freq: Four times a day (QID) | ORAL | Status: DC | PRN

## 2023-11-23 MED ORDER — HEPARIN SODIUM (PORCINE) 5000 UNIT/ML IJ SOLN: 5000.0000 [IU] | Freq: Three times a day (TID) | INTRAMUSCULAR | Status: DC

## 2023-11-23 MED ORDER — BACLOFEN 10 MG PO TABS
10.0000 mg | ORAL_TABLET | Freq: Every day | ORAL | Status: DC
Start: 2023-11-24 — End: 2023-11-28
  Administered 2023-11-24 – 2023-11-28 (×5): 10 mg via ORAL
  Filled 2023-11-23 (×5): qty 1

## 2023-11-23 MED ORDER — SODIUM CHLORIDE 0.9 % IV BOLUS
500.0000 mL | Freq: Once | INTRAVENOUS | Status: AC
  Administered 2023-11-23: 500 mL via INTRAVENOUS

## 2023-11-23 MED ORDER — SODIUM CHLORIDE 0.9% FLUSH: 10.0000 mL | INTRAVENOUS | Status: DC | PRN

## 2023-11-23 MED ORDER — METOPROLOL TARTRATE 50 MG PO TABS
100.0000 mg | ORAL_TABLET | Freq: Two times a day (BID) | ORAL | Status: DC
  Administered 2023-11-23 – 2023-11-28 (×10): 100 mg via ORAL
  Filled 2023-11-23 (×10): qty 2

## 2023-11-23 MED ORDER — DM-GUAIFENESIN ER 30-600 MG PO TB12: 1.0000 | ORAL_TABLET | Freq: Two times a day (BID) | ORAL | Status: DC | PRN

## 2023-11-23 MED ORDER — SODIUM CHLORIDE 0.9 % IV SOLN
2.0000 g | Freq: Three times a day (TID) | INTRAVENOUS | Status: DC
  Administered 2023-11-24: 2 g via INTRAVENOUS
  Filled 2023-11-23 (×2): qty 12.5

## 2023-11-23 MED ORDER — ADULT MULTIVITAMIN W/MINERALS CH
1.0000 | ORAL_TABLET | Freq: Every day | ORAL | Status: DC
  Administered 2023-11-24 – 2023-11-28 (×5): 1 via ORAL
  Filled 2023-11-23 (×5): qty 1

## 2023-11-23 NOTE — H&P (Signed)
 History and Physical    Danielle Harrison FMW:[id number] DOB: 1976/03/09 DOA: 11/23/2023  Referring MD/NP/PA:   PCP: Carin Gauze, NP   Patient coming from:  The patient is coming from home.     Chief Complaint: Muscle spasm, chest pain, back pain  HPI: Danielle Harrison is a 48 y.o. 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 Pt was discharged on daptomycin  given through PICC line in right arm. Pt states that her left great toe pain has significantly improved.  No more abdominal pain, nausea vomiting or diarrhea.    She continues to have muscle spasm and increasing pain across her whole front chest. Also has middle back pain.  Her chest pain is sharp, severe, pleuritic, aggravated by deep breath.  Associated with shortness of breath.  No cough, fever or chills.  No loss control of bladder or bowel movement.  Denies symptoms of UTI.  Patient states that she did not drink alcohol since last admission.  Patient denies drug use.  Data reviewed independently and ED Course: pt was found to have WBC 13.1, potassium 5.6, GFR> 60, troponin 4 --> 4, lactic acid 3.4.  Temperature normal, blood pressure 154/112, heart rate 1 1, RR 16, oxygen  saturation 100% on room air. Pt is admitted to telemetry bed as inpatient.  CTA: 1. No CT evidence of pulmonary artery embolus. 2. Findings most consistent with  spondylo-discitis at T6-T7 with progression since the MRI of 10/23/2023.   CT of the T-spine: Findings consistent with osteomyelitis/discitis centered at T6-T7.  Chest x-ray: 1. New anterior wedging within the midthoracic spine at approximately the T6 level, with progressive loss of disc space height, concerning for progressive discitis/osteomyelitis given previous MRI findings. Repeat MRI imaging may be useful. 2. Scattered areas of subsegmental atelectasis at the lung bases.  MRI-T spin: 1. Extensive progressive discitis and osteomyelitis at T6-7 and to a lesser extent at T7-8 with paraspinal and epidural abscesses. 2. Extensive epidural abscess and extensive epidural enhancing inflammatory phlegmon surrounding and compressing the thoracic spinal cord at the above levels. 3. No definite cord lesions or syrinx. No abnormal cord enhancement. 4. Incidental note is made of abnormal T1 and T2 signal intensity and abnormal enhancement in the L2 vertebral body suspicious for osteomyelitis.    EKG: I have personally reviewed.  Sinus rhythm, QTc 408, LAE, nonspecific T wave change.   Review of Systems:   General: no fevers, chills, no body weight gain, has fatigue HEENT: no blurry vision, hearing changes or sore throat Respiratory: has dyspnea, no coughing, no wheezing CV: has chest pain, no palpitations GI: no nausea, vomiting, abdominal pain, diarrhea, constipation GU: no dysuria, burning on urination, increased urinary frequency, hematuria  Ext: no leg edema Neuro: no unilateral weakness, numbness, or tingling, no vision change or hearing loss Skin: no rash, no skin tear. MSK: No muscle spasm, no deformity, no limitation of range of movement in spin. Has mid back pain Heme: No easy bruising.  Travel history: No recent long distant  travel.   Allergy:  Allergies  Allergen Reactions   Diclofenac Hives    Past Medical History:  Diagnosis Date   Anxiety    Arthritis     back   Asthma    HTN (hypertension)    Neuromuscular disorder (HCC)    weakness and numbness    Restless leg syndrome     Past Surgical History:  Procedure Laterality Date   BIOPSY  06/28/2019   Procedure: BIOPSY;  Surgeon: Janalyn Keene NOVAK, MD;  Location: [facility];  Service: Endoscopy;;   CESAREAN SECTION     x2   COLONOSCOPY WITH PROPOFOL  N/A 06/28/2019   Procedure: COLONOSCOPY WITH PROPOFOL ;  Surgeon: Janalyn Keene NOVAK, MD;  Location: [facility];  Service: Endoscopy;  Laterality: N/A;   ESOPHAGOGASTRODUODENOSCOPY (EGD) WITH PROPOFOL  N/A 06/28/2019   Procedure: ESOPHAGOGASTRODUODENOSCOPY (EGD) WITH PROPOFOL ;  Surgeon: Janalyn Keene NOVAK, MD;  Location: [facility];  Service: Endoscopy;  Laterality: N/A;   INCISION AND DRAINAGE Left 10/27/2023   Procedure: INCISION AND DRAINAGE LEFT FOOT;  Surgeon: Malvin Marsa FALCON, DPM;  Location: [facility];  Service: Orthopedics/Podiatry;  Laterality: Left;   INCISION AND DRAINAGE Left 10/30/2023   Procedure: LEFT FOOT FIRST METATARSAL PHALANGEAL JOINT RESECTION, ANTIBIOTIC SPACER, WASHOUT, WOUND CLOSURE;  Surgeon: Malvin Marsa FALCON, DPM;  Location: [facility];  Service: Orthopedics/Podiatry;  Laterality: Left;   TEE WITHOUT CARDIOVERSION N/A 10/29/2023   Procedure: TRANSESOPHAGEAL ECHOCARDIOGRAM (TEE);  Surgeon: Perla Evalene PARAS, MD;  Location: [facility];  Service: Cardiovascular;  Laterality: N/A;   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   Procedure: LEFT FOOT 1ST MPJ RESECTION;  Surgeon: Malvin Marsa FALCON, DPM;  Location: [facility];  Service: Orthopedics/Podiatry;  Laterality: Left;   TRANSMETATARSAL AMPUTATION Left 10/30/2023   Procedure: LEFT FOOT 1ST MPJ RESECTION;  Surgeon: Malvin Marsa FALCON, DPM;  Location: [facility];  Service: Orthopedics/Podiatry;  Laterality: Left;    Social History:  reports that she has quit smoking. Her smoking use included cigarettes. She has a 3 pack-year  smoking history. She has never used smokeless tobacco. She reports that she does not currently use alcohol. She reports that she does not use drugs.  Family History:  Family History  Problem Relation Age of Onset   Colon cancer Neg Hx      Prior to Admission medications   Medication Sig Start Date End Date Taking? Authorizing Provider  acetaminophen  (TYLENOL ) 325 MG tablet Take 2 tablets (650 mg total) by mouth every 8 (eight) hours as needed for mild pain (pain score 1-3), moderate pain (pain score 4-6), fever or headache. 11/04/23   Von Bellis, MD  ascorbic acid  (VITAMIN C ) 500 MG tablet Take 1 tablet (500 mg total) by mouth daily. 11/04/23 12/04/23  Von Bellis, MD  baclofen  (LIORESAL ) 10 MG tablet Take 1 tablet (10 mg total) by mouth daily. 11/21/23 11/20/24  Scoggins, Amber, NP  bisacodyl  (DULCOLAX) 5 MG EC tablet Take 2 tablets (10 mg total) by mouth at bedtime as needed for moderate constipation. 11/04/23   Von Bellis, MD  daptomycin  (CUBICIN ) IVPB Inject 500 mg into the vein daily. Indication:  MRSA bacteremia, thoracic discitis, toe osteomyelitis First Dose: Yes Last Day of Therapy:  12/10/2023 Labs - Once weekly:  CBC/D, CMP, CPK, CRP, ESR Fax weekly lab results  promptly to [phone] Please pull PIC at completion of IV antibiotics Call [phone] with critical value or questions Method of administration: IV Push Method of administration may be changed at the discretion of home infusion  pharmacist based upon assessment of the patient and/or caregiver's ability to self-administer the medication ordered. 11/05/23 12/10/23  Von Bellis, MD  gabapentin  (NEURONTIN ) 100 MG capsule Take 1 capsule (100 mg total) by mouth 3 (three) times daily. 11/13/23   Tobie Franky SQUIBB, DPM  metFORMIN  (GLUCOPHAGE ) 500 MG tablet Take 1 tablet (500 mg total) by mouth 2 (two) times daily with a meal. 11/04/23 05/02/24  Von Bellis, MD  metoprolol  tartrate (LOPRESSOR ) 100 MG tablet Take 1 tablet (100 mg  total) by mouth 2 (two) times daily. 11/04/23 05/02/24  Von Bellis, MD  Multiple Vitamin (MULTIVITAMIN WITH MINERALS) TABS tablet Take 1 tablet by mouth daily. 11/05/23   Von Bellis, MD  oxyCODONE  (OXY IR/ROXICODONE ) 5 MG immediate release tablet Take 1 tablet (5 mg total) by mouth every 6 (six) hours as needed for severe pain (pain score 7-10). 11/11/23   Standiford, Marsa FALCON, DPM  polyethylene glycol powder (GLYCOLAX /MIRALAX ) 17 GM/SCOOP powder Take 17 g by mouth daily. Mix as directed. 11/04/23   Von Bellis, MD  predniSONE  (STERAPRED UNI-PAK 21 TAB) 10 MG (21) TBPK tablet Take by mouth daily. Take 6 tabs by mouth daily  for 1 days, then 5 tabs for 1 days, then 4 tabs for 1 days, then 3 tabs for 1 days, 2 tabs for 1 days, then 1 tab by mouth daily for 1 days 11/16/23   Teresa Shelba SAUNDERS, NP    Physical Exam: Vitals:   11/23/23 2000 11/23/23 2030 11/23/23 2124 11/23/23 2204  BP: 137/88 (!) 150/101  (!) 152/107  Pulse: 91 93  91  Resp:    18  Temp:   98.5 F (36.9 C) 98.8 F (37.1 C)  TempSrc:   Oral   SpO2: 99% 99%  100%  Weight:      Height:       General: Not in acute distress HEENT:       Eyes: PERRL, EOMI, no jaundice       ENT: No discharge from the ears and nose, no pharynx injection, no tonsillar enlargement.        Neck: No JVD, no bruit, no mass felt. Heme: No neck lymph node enlargement. Cardiac: S1/S2, RRR, No murmurs, No gallops or rubs. Respiratory: No rales, wheezing, rhonchi or rubs. GI: Soft, nondistended, nontender, no rebound pain, no organomegaly, BS present. GU: No hematuria Ext: No pitting leg edema bilaterally. 1+DP/PT pulse bilaterally. Musculoskeletal: has tenderness in the mid left mid back Skin: No rashes.  Neuro: Alert, oriented X3, cranial nerves II-XII grossly intact, moves all extremities normally. Psych: Patient is not psychotic, no suicidal or hemocidal ideation.  Labs on Admission: I have personally reviewed following labs and imaging  studies  CBC: Recent Labs  Lab 11/23/23 1549  WBC 13.1*  HGB 11.1*  HCT 34.7*  MCV 95.6  PLT 730*   Basic Metabolic Panel: Recent Labs  Lab 11/23/23 1549  NA 133*  K 5.6*  CL 103  CO2 19*  GLUCOSE 176*  BUN 20  CREATININE 0.56  CALCIUM 10.0   GFR: Estimated Creatinine Clearance: 62.4 mL/min (by C-G formula based on SCr of 0.56 mg/dL). Liver Function Tests: Recent Labs  Lab 11/23/23 1549  AST 25  ALT 23  ALKPHOS 105  BILITOT 0.7  PROT 8.6*  ALBUMIN 4.0   Recent Labs  Lab 11/23/23 1549  LIPASE 21   No results for input(s): AMMONIA in the last 168 hours. Coagulation Profile: Recent Labs  Lab 11/23/23 2155  INR 1.1  Cardiac Enzymes: No results for input(s): CKTOTAL, CKMB, CKMBINDEX, TROPONINI in the last 168 hours. BNP (last 3 results) No results for input(s): PROBNP in the last 8760 hours. HbA1C: No results for input(s): HGBA1C in the last 72 hours. CBG: No results for input(s): GLUCAP in the last 168 hours. Lipid Profile: No results for input(s): CHOL, HDL, LDLCALC, TRIG, CHOLHDL, LDLDIRECT in the last 72 hours. Thyroid  Function Tests: No results for input(s): TSH, T4TOTAL, FREET4, T3FREE, THYROIDAB in the last 72 hours. Anemia Panel: No results for input(s): VITAMINB12, FOLATE, FERRITIN, TIBC, IRON, RETICCTPCT in the last 72 hours. Urine analysis:    Component Value Date/Time   COLORURINE YELLOW (A) 10/23/2023 1134   APPEARANCEUR HAZY (A) 10/23/2023 1134   APPEARANCEUR Clear 03/01/2014 1301   LABSPEC 1.015 10/23/2023 1134   LABSPEC 1.012 03/01/2014 1301   PHURINE 6.0 10/23/2023 1134   GLUCOSEU >=500 (A) 10/23/2023 1134   GLUCOSEU 50 mg/dL 94/80/7984 8698   HGBUR SMALL (A) 10/23/2023 1134   BILIRUBINUR NEGATIVE 10/23/2023 1134   BILIRUBINUR Negative 03/01/2014 1301   KETONESUR NEGATIVE 10/23/2023 1134   PROTEINUR 30 (A) 10/23/2023 1134   NITRITE NEGATIVE 10/23/2023 1134   LEUKOCYTESUR  NEGATIVE 10/23/2023 1134   LEUKOCYTESUR Negative 03/01/2014 1301   Sepsis Labs: @LABRCNTIP (procalcitonin:4,lacticidven:4) )No results found for this or any previous visit (from the past 240 hours).   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  No lower extremity numbness or weakness. Consulted Dr. Deatrice of neurosurgery, he recommended to continue antibiotics and get ID consult.  Since no alarming symptoms now, patient can be observed safely on antibiotics.  He recommended to repeat MRI in 48 hours.  Patient has WBC 13.1, lactic acid 3.4, but no fever.  Clinically does not seem to have sepsis.  -Admitted to telemetry bed as inpatient -Antibiotics: Vancomycin  and cefepime  were started in ED, will continue -IV fluid: 1.5 L normal saline, then 75 cc/h -check ESR and CRP -Check procalcitonin level and trend lactic acid level -Please consult ID in morning -Please repeat MRI in 48-hour  Asthma: Stable -As needed Mucinex , bronchodilators   HTN (hypertension) -IV hydralazine  as needed -Metoprolol   Hyperkalemia: Potassium 5.6 -10 g Lokelma   Recent septic arthritis of left foot (HCC): s/p of washout.  Surgical site is healing well -On antibiotics as above        DVT ppx: SCD  Code Status: Full code   Family Communication:     not done, no family member is at bed side.      Disposition Plan:  Anticipate discharge back to previous environment  Consults called: Dr. Deatrice of neurosurgery  Admission status and Level of care: Telemetry Medical:  as inpt          Dispo: The patient is from: Home              Anticipated d/c  is to: Home              Anticipated d/c date is: 2 days              Patient currently is not medically stable to d/c.    Severity of Illness:  The appropriate patient status for this patient is INPATIENT. Inpatient status is judged to be reasonable and necessary in order to provide the required intensity of service to ensure the patient's safety. The patient's presenting symptoms, physical exam findings, and initial radiographic and laboratory data in the context of their chronic comorbidities is felt to place them at high risk for further clinical deterioration. Furthermore, it is not anticipated that the patient will be medically stable for discharge from the hospital within 2 midnights of admission.   * I certify that at the point of admission it is my clinical judgment that the patient will require inpatient hospital care spanning beyond 2 midnights from the point of admission due to high intensity of service, high risk for further deterioration and high frequency of surveillance required.*       Date of Service 11/24/2023    Caleb Exon [facility]   If 7PM-7AM, please contact night-coverage [email or web address] 11/24/2023, 1:17 AM

## 2023-11-23 NOTE — Progress Notes (Signed)
 Neurosurgery brief note  Was contacted by the hospitalist service regarding this patient who had been admitted in brief per the record she is a 48 y.o. female with medical history significant of hypertension, hyperlipidemia, asthma, anxiety, RLS, pancreatitis, who was recently admitted due to MRSA bacteremia as well as a thoracic osteomyelitis left MPJ osteomyelitis, who had presented with back pain in the midthoracic area with radiation to the chest.  Of note the patient had recently been hospitalized from January 4 of January 21 due to multiple issues including acute EtOH pancreatitis, sepsis due to MRSA bacteremia, thoracic vertebral osteomyelitis and T6-T9 and acute ostial osteomyelitis of the left first MPJ requiring a washout and focal small bowel intussusception in the left mid Pt was discharged on daptomycin  given through PICC line.  Per available report the patient is neurologically intact including intact strength and sensation no loss of bowel or bladder function she is otherwise awake fluent and appropriate   IMPRESSION: 1. Extensive progressive discitis and osteomyelitis at T6-7 and to a lesser extent at T7-8 with paraspinal and epidural abscesses. 2. Extensive epidural abscess and extensive epidural enhancing inflammatory phlegmon surrounding and compressing the thoracic spinal cord at the above levels. 3. No definite cord lesions or syrinx. No abnormal cord enhancement. 4. Incidental note is made of abnormal T1 and T2 signal intensity and abnormal enhancement in the L2 vertebral body suspicious for osteomyelitis.   These results will be called to the ordering clinician or representative by the Radiologist Assistant, and communication documented in the PACS or [employer].     Electronically Signed   By: MYRTIS Stammer M.D.   On: 11/23/2023 21:30 Narrative & Impression  CLINICAL DATA:  Back pain.  No known injury.   EXAM: CT THORACIC SPINE WITHOUT CONTRAST    TECHNIQUE: Multidetector CT images of thoracic was performed according to the standard protocol following intravenous contrast administration.   RADIATION DOSE REDUCTION: This exam was performed according to the departmental dose-optimization program which includes automated exposure control, adjustment of the mA and/or kV according to patient size and/or use of iterative reconstruction technique.   CONTRAST:  75mL OMNIPAQUE  IOHEXOL  350 MG/ML SOLN   COMPARISON:  Chest CT dated 11/23/2023 and thoracic spine radiograph dated 11/21/2023. Thoracic spine MRI dated 10/23/2023.   FINDINGS: Alignment: No acute subluxation.   Vertebrae: No acute fracture.   Paraspinal and other soft tissues: There is prevertebral soft tissue thickening centered at T6-T7 with inflammatory changes extending into the posterior mediastinum and surrounding the descending thoracic aorta. No drainable fluid collection or abscess.   Disc levels: There is disc space narrowing and endplate irregularity at T6-T9. Progression of endplate irregularity and fragmentation with disc space narrowing at T6-T7. There is slight anterior wedging of T7. Findings consistent with osteomyelitis/discitis centered at T6-T7. Further evaluation with MRI without and with contrast recommended.   IMPRESSION: Findings consistent with osteomyelitis/discitis centered at T6-T7. Further evaluation with MRI without and with contrast recommended.   AP: Overall the patient appears to have an epidural compressive collection both dorsal and ventral to the thoracic cord at approximately levels of T6-T9 with enhancement within the disc space of T6-7 consistent with discitis with epidural extension likely epidural abscess in this area.  There is distraction of the disc space with endplate irregularity between T6-T9 and slight anterior wedging of T7 consistent with osteomyelitis  Overall I am concerned by the compressive nature of lesion however it  is very reassuring the patient is neurologic exam is intact per  report.  Given this intact neurologic exam I think antibiotic therapy at the moment may be sufficient in the short-term to control the infection though she may ultimately need stabilization of the region or ultimately evacuation of this collection should it progress or continue to result in bony destruction to the region.  The patient is currently on broad-spectrum antibiotics including vancomycin  and cefepime  which I would continue.   Recommend consulting infectious disease service for guidance on antibiotic therapy given the persistence of her infectious findings but particularly in light of her recent admission for MRSA bacteremia I would place the patient in a TLSO brace at all times I would keep the patient n.p.o. for now in case she has decline in her neurologic exam prompting surgical evacuation of his region. In the meantime I would continue antibiotic therapy with close neurologic exam monitoring and I would repeat the MRI of her thoracic spine at 48 hours or if there should be any change in neurologic exam.    Neurosurgery will continue to follow  Danielle PARAS. Deatrice, MD Neurosurgery

## 2023-11-23 NOTE — Progress Notes (Signed)
 Pharmacy Antibiotic Note  Danielle Harrison is a 48 y.o. female admitted on 11/23/2023 with  recent history of osteomyelitis of T-spine on daptomycin , but worsening now .  Pharmacy has been consulted for Vancomycin  and Cefepime  dosing.  Plan: Vancomycin  1000 mg IV Q 24 hrs. Goal AUC 400-550. Expected AUC: 495 SCr used: 0.8 Expected Cmin: 10.3   Height: 5' (152.4 cm) Weight: 49.9 kg (110 lb) IBW/kg (Calculated) : 45.5  Temp (24hrs), Avg:98.3 F (36.8 C), Min:98.3 F (36.8 C), Max:98.3 F (36.8 C)  Recent Labs  Lab 11/23/23 1549 11/23/23 1725  WBC 13.1*  --   CREATININE 0.56  --   LATICACIDVEN  --  3.4*    Estimated Creatinine Clearance: 62.4 mL/min (by C-G formula based on SCr of 0.56 mg/dL).    Allergies  Allergen Reactions   Diclofenac Hives    Antimicrobials this admission: Cefepime  2/9 >>  Vancomycin  2/9 >>   Dose adjustments this admission:  Microbiology results:  Thank you for allowing pharmacy to be a part of this patient's care.  Olam Fritter, PharmD, BCPS 11/23/2023 8:39 PM

## 2023-11-23 NOTE — ED Notes (Signed)
 Patient transported to CT

## 2023-11-23 NOTE — ED Provider Notes (Signed)
 [facility] Provider Note    Event Date/Time   First MD Initiated Contact with Patient 11/23/23 1657     (approximate)   History   Chief Complaint Arm Pain and Chest Pain   HPI  Danielle Harrison is a 48 y.o. female with past medical history of hypertension, hyperlipidemia, alcohol abuse, pancreatitis, and recent MRSA bacteremia who presents to the ED complaining of chest pain.  Patient reports that for the past 2 weeks she has increasing pain across both sides of her chest.  She describes the pain as sharp and severe, worse when she takes a deep breath.  She denies any fevers or cough, has not had any pain or swelling in her legs, but does report a history of DVT.  She also reports ongoing pain in the middle of her back, was recently admitted to the hospital for MRSA bacteremia as well as T6-T9 bacteremia and septic arthritis of left first MPJ requiring washout.  She has been receiving daptomycin  through PICC line to her right arm, denies any issues taking the antibiotic.  She was found to have pancreatitis at the time of her admission, but denies any current abdominal pain, nausea, or vomiting.     Physical Exam   Triage Vital Signs: ED Triage Vitals  Encounter Vitals Group     BP 11/23/23 1543 (!) 149/111     Systolic BP Percentile --      Diastolic BP Percentile --      Pulse Rate 11/23/23 1543 100     Resp 11/23/23 1543 16     Temp 11/23/23 1543 98.3 F (36.8 C)     Temp Source 11/23/23 1543 Oral     SpO2 11/23/23 1543 99 %     Weight 11/23/23 1544 110 lb (49.9 kg)     Height 11/23/23 1544 5' (1.524 m)     Head Circumference --      Peak Flow --      Pain Score 11/23/23 1544 10     Pain Loc --      Pain Education --      Exclude from Growth Chart --     Most recent vital signs: Vitals:   11/23/23 1730 11/23/23 1830  BP: (!) 125/99 (!) 154/112  Pulse: 92 (!) 101  Resp:    Temp:    SpO2: 100% 100%    Constitutional: Alert and  oriented. Eyes: Conjunctivae are normal. Head: Atraumatic. Nose: No congestion/rhinnorhea. Mouth/Throat: Mucous membranes are moist.  Cardiovascular: Normal rate, regular rhythm. Grossly normal heart sounds.  2+ radial pulses bilaterally.  Right upper extremity PICC line in place with no erythema, warmth, or tenderness. Respiratory: Normal respiratory effort.  No retractions. Lungs CTAB.  No chest wall tenderness to palpation. Gastrointestinal: Soft and nontender. No distention. Musculoskeletal: No lower extremity tenderness nor edema.  Midline thoracic spinal tenderness to palpation. Neurologic:  Normal speech and language. No gross focal neurologic deficits are appreciated.    ED Results / Procedures / Treatments   Labs (all labs ordered are listed, but only abnormal results are displayed) Labs Reviewed  BASIC METABOLIC PANEL - Abnormal; Notable for the following components:      Result Value   Sodium 133 (*)    Potassium 5.6 (*)    CO2 19 (*)    Glucose, Bld 176 (*)    All other components within normal limits  CBC - Abnormal; Notable for the following components:   WBC 13.1 (*)  RBC 3.63 (*)    Hemoglobin 11.1 (*)    HCT 34.7 (*)    Platelets 730 (*)    All other components within normal limits  HEPATIC FUNCTION PANEL - Abnormal; Notable for the following components:   Total Protein 8.6 (*)    All other components within normal limits  LACTIC ACID, PLASMA - Abnormal; Notable for the following components:   Lactic Acid, Venous 3.4 (*)    All other components within normal limits  CULTURE, BLOOD (ROUTINE X 2)  CULTURE, BLOOD (ROUTINE X 2)  LIPASE, BLOOD  LACTIC ACID, PLASMA  TROPONIN I (HIGH SENSITIVITY)  TROPONIN I (HIGH SENSITIVITY)     EKG  ED ECG REPORT I, Carlin Palin, the attending physician, personally viewed and interpreted this ECG.   Date: 11/23/2023  EKG Time: 15:51  Rate: 92  Rhythm: normal sinus rhythm  Axis: Normal  Intervals:none  ST&T  Change: None  RADIOLOGY Chest x-ray reviewed and interpreted by me with no infiltrate, edema, or effusion.  PROCEDURES:  Critical Care performed: No  Procedures   MEDICATIONS ORDERED IN ED: Medications  LORazepam  (ATIVAN ) injection 1 mg (has no administration in time range)  vancomycin  (VANCOCIN ) IVPB 1000 mg/200 mL premix (has no administration in time range)  ceFEPIme  (MAXIPIME ) 2 g in sodium chloride  0.9 % 100 mL IVPB (has no administration in time range)  morphine  (PF) 4 MG/ML injection 4 mg (4 mg Intravenous Given 11/23/23 1747)  sodium chloride  0.9 % bolus 1,000 mL (1,000 mLs Intravenous New Bag/Given 11/23/23 1745)  iohexol  (OMNIPAQUE ) 350 MG/ML injection 75 mL (75 mLs Intravenous Contrast Given 11/23/23 1822)     IMPRESSION / MDM / ASSESSMENT AND PLAN / ED COURSE  I reviewed the triage vital signs and the nursing notes.                              48 y.o. female with past medical history of hypertension, hyperlipidemia, alcohol abuse, pancreatitis, and recent admission for MRSA bacteremia with thoracic spinal osteomyelitis as well as septic arthritis of her left great toe who presents to the ED complaining of worsening pain in her chest with mild difficulty breathing as well as pain in the middle of her upper back.  Patient's presentation is most consistent with acute presentation with potential threat to life or bodily function.  Differential diagnosis includes, but is not limited to, ACS, PE, pneumonia, pneumothorax, osteomyelitis, discitis, sepsis, pancreatitis, anemia, electrolyte abnormality, AKI.  Patient uncomfortable but nontoxic-appearing and in no acute distress, vital signs are unremarkable.  EKG shows no evidence of arrhythmia or ischemia and troponin within normal limits, symptoms seem atypical for ACS.  She does seem to be high risk for PE given history of DVT with pleuritic chest pain, will further assess with CTA of her chest.  Chest x-ray negative for pulmonary  process but does show increased wedging of T6 concerning for worsening osteomyelitis, will check CT imaging of her thoracic spine along with imaging of her chest.  Labs with worsening leukocytosis, will redraw blood cultures given recent bacteremia.  Remainder of labs reassuring with no significant anemia or AKI, patient does have mild hyperkalemia and will hydrate with IV fluids.  LFTs and lipase are unremarkable, no findings concerning for recurrent pancreatitis.  We will treat symptomatically with IV morphine  and reassess following imaging.  CTA chest is negative for pulmonary embolism or pneumonia, does show worsening disease at the thoracic spine concerning  for worsening osteomyelitis/discitis.  We will further assess with MRI with and without contrast, give dose of IV cefepime  and vancomycin .  Case discussed with hospitalist for admission.      FINAL CLINICAL IMPRESSION(S) / ED DIAGNOSES   Final diagnoses:  Acute osteomyelitis of thoracic spine (HCC)  Chest pain, unspecified type     Rx / DC Orders   ED Discharge Orders     None        Note:  This document was prepared using Dragon voice recognition software and may include unintentional dictation errors.   Willo Dunnings, MD 11/23/23 2005

## 2023-11-23 NOTE — ED Triage Notes (Signed)
 Patient c/o muscle spasms in abdomen and chest X16 days. Reports she cannot handle it. Hurts to talk, turn, twist, and move   PICC line present in right arm due to giving herself antibiotics at home for MRSA infection. Recently had surgery on right foot also due to a bacteria

## 2023-11-24 ENCOUNTER — Inpatient Hospital Stay: Payer: Medicaid Other

## 2023-11-24 DIAGNOSIS — G062 Extradural and subdural abscess, unspecified: Secondary | ICD-10-CM | POA: Diagnosis not present

## 2023-11-24 DIAGNOSIS — R7881 Bacteremia: Secondary | ICD-10-CM | POA: Diagnosis not present

## 2023-11-24 DIAGNOSIS — M4624 Osteomyelitis of vertebra, thoracic region: Principal | ICD-10-CM

## 2023-11-24 DIAGNOSIS — B9562 Methicillin resistant Staphylococcus aureus infection as the cause of diseases classified elsewhere: Secondary | ICD-10-CM

## 2023-11-24 DIAGNOSIS — M00072 Staphylococcal arthritis, left ankle and foot: Secondary | ICD-10-CM | POA: Diagnosis not present

## 2023-11-24 LAB — CBC
HCT: 31.1 % — ABNORMAL LOW (ref 36.0–46.0)
Hemoglobin: 10 g/dL — ABNORMAL LOW (ref 12.0–15.0)
MCH: 30.7 pg (ref 26.0–34.0)
MCHC: 32.2 g/dL (ref 30.0–36.0)
MCV: 95.4 fL (ref 80.0–100.0)
Platelets: 680 10*3/uL — ABNORMAL HIGH (ref 150–400)
RBC: 3.26 MIL/uL — ABNORMAL LOW (ref 3.87–5.11)
RDW: 13.5 % (ref 11.5–15.5)
WBC: 10.9 10*3/uL — ABNORMAL HIGH (ref 4.0–10.5)
nRBC: 0 % (ref 0.0–0.2)

## 2023-11-24 LAB — BASIC METABOLIC PANEL
Anion gap: 8 (ref 5–15)
BUN: 15 mg/dL (ref 6–20)
CO2: 20 mmol/L — ABNORMAL LOW (ref 22–32)
Calcium: 9.2 mg/dL (ref 8.9–10.3)
Chloride: 107 mmol/L (ref 98–111)
Creatinine, Ser: 0.49 mg/dL (ref 0.44–1.00)
GFR, Estimated: >60 mL/min (ref >60)
Glucose, Bld: 104 mg/dL — ABNORMAL HIGH (ref 70–99)
Potassium: 4 mmol/L (ref 3.5–5.1)
Sodium: 135 mmol/L (ref 135–145)

## 2023-11-24 LAB — C-REACTIVE PROTEIN: CRP: 1.4 mg/dL — ABNORMAL HIGH (ref <1.0)

## 2023-11-24 MED ORDER — VANCOMYCIN HCL 750 MG/150ML IV SOLN
750.0000 mg | Freq: Two times a day (BID) | INTRAVENOUS | Status: DC
  Administered 2023-11-24 – 2023-11-27 (×6): 750 mg via INTRAVENOUS
  Filled 2023-11-24 (×6): qty 150

## 2023-11-24 MED ORDER — VANCOMYCIN HCL IN DEXTROSE 1-5 GM/200ML-% IV SOLN
1000.0000 mg | INTRAVENOUS | Status: DC
  Filled 2023-11-24: qty 200

## 2023-11-24 MED ORDER — OXYCODONE-ACETAMINOPHEN 5-325 MG PO TABS
1.0000 | ORAL_TABLET | ORAL | Status: DC | PRN
  Administered 2023-11-24 – 2023-11-25 (×4): 2 via ORAL
  Filled 2023-11-24 (×4): qty 2

## 2023-11-24 MED ORDER — SODIUM CHLORIDE 0.9 % IV SOLN
2.0000 g | Freq: Three times a day (TID) | INTRAVENOUS | Status: DC
  Administered 2023-11-24: 2 g via INTRAVENOUS
  Filled 2023-11-24 (×3): qty 12.5

## 2023-11-24 NOTE — H&P (View-Only) (Signed)
In preparation for surgery please obtain a type and screen, have her n.p.o. at midnight, hold any anticoagulation medications.  She will likely have her procedure in the late morning/early afternoon tomorrow.  She is currently being posted for this.

## 2023-11-24 NOTE — Plan of Care (Signed)

## 2023-11-24 NOTE — Consult Note (Signed)
 Consulting Department:  Inpatient medicine  Primary Physician:  [name], NP  Chief Complaint: Spinal epidural abscess  History of Present Illness: 11/24/2023 Danielle Harrison is a 48 y.o. female who presents with the chief complaint of spinal epidural abscess.  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  When she was last admitted she had an MRI of her thoracic spine and January which demonstrated early signs of discitis.  Unfortunately her back pain has worsened significantly she is also getting bandlike pain throughout her bilateral chest that radiates from her back around her trunk.  Show will get some intermittent shooting down her legs.  She also feels like she is having worsening back pain.  She cannot lean back without having significant/severe pain.  She does feel that she has some weakness in her right lower extremity, does have a history of transverse myelitis but feels like this is different.  Not having any bowel or bladder issues.  The symptoms are causing a significant impact on the patient's life.   Review of Systems:  A 10 point review of systems is negative, except for the pertinent positives and negatives detailed in the HPI.  Past Medical History: Past Medical History:  Diagnosis Date   Anxiety    Arthritis    back   Asthma    HTN (hypertension)    Neuromuscular disorder (HCC)    weakness and numbness    Restless leg syndrome     Past Surgical History: Past Surgical History:  Procedure Laterality Date   BIOPSY  06/28/2019   Procedure: BIOPSY;  Surgeon: Irby Mannan, MD;  Location: [facility];  Service: Endoscopy;;   CESAREAN SECTION     x2   COLONOSCOPY WITH PROPOFOL  N/A 06/28/2019   Procedure: COLONOSCOPY WITH PROPOFOL ;  Surgeon: Irby Mannan, MD;  Location: [facility];  Service: Endoscopy;  Laterality: N/A;   ESOPHAGOGASTRODUODENOSCOPY (EGD) WITH PROPOFOL  N/A 06/28/2019   Procedure:  ESOPHAGOGASTRODUODENOSCOPY (EGD) WITH PROPOFOL ;  Surgeon: Irby Mannan, MD;  Location: [facility];  Service: Endoscopy;  Laterality: N/A;   INCISION AND DRAINAGE Left 10/27/2023   Procedure: INCISION AND DRAINAGE LEFT FOOT;  Surgeon: Evertt Hoe, DPM;  Location: [facility];  Service: Orthopedics/Podiatry;  Laterality: Left;   INCISION AND DRAINAGE Left 10/30/2023   Procedure: LEFT FOOT FIRST METATARSAL PHALANGEAL JOINT RESECTION, ANTIBIOTIC SPACER, WASHOUT, WOUND CLOSURE;  Surgeon: Evertt Hoe, DPM;  Location: [facility];  Service: Orthopedics/Podiatry;  Laterality: Left;   TEE WITHOUT CARDIOVERSION N/A 10/29/2023   Procedure: TRANSESOPHAGEAL ECHOCARDIOGRAM (TEE);  Surgeon: Devorah Fonder, MD;  Location: [facility];  Service: Cardiovascular;  Laterality: N/A;   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   Procedure: LEFT FOOT 1ST MPJ RESECTION;  Surgeon: Evertt Hoe, DPM;  Location: [facility];  Service: Orthopedics/Podiatry;  Laterality: Left;   TRANSMETATARSAL AMPUTATION Left 10/30/2023   Procedure: LEFT FOOT 1ST MPJ RESECTION;  Surgeon: Evertt Hoe, DPM;  Location: [facility];  Service: Orthopedics/Podiatry;  Laterality: Left;    Allergies: Allergies as of 11/23/2023 - Review Complete 11/23/2023  Allergen Reaction Noted   Diclofenac Hives 06/02/2022    Medications:  Current Facility-Administered Medications:    0.9 %  sodium chloride  infusion, , Intravenous, Continuous, Niu, Xilin, MD, Last Rate: 75 mL/hr at 11/24/23 0328, Infusion Verify at 11/24/23 1610   acetaminophen  (TYLENOL ) tablet 650 mg, 650 mg, Oral, Q6H PRN, Niu, Xilin, MD   albuterol  (PROVENTIL ) (2.5 MG/3ML) 0.083% nebulizer solution 2.5  mg, 2.5 mg, Inhalation, Q4H PRN, Niu, Xilin, MD   baclofen  (LIORESAL ) tablet 10 mg, 10 mg, Oral, Daily, Niu, Xilin, MD, 10 mg at 11/24/23 1045   bisacodyl  (DULCOLAX) EC tablet 10 mg, 10 mg, Oral, QHS PRN, Niu, Xilin, MD   ceFEPIme   (MAXIPIME ) 2 g in sodium chloride  0.9 % 100 mL IVPB, 2 g, Intravenous, Q8H, Hunt, Madison H, RPH   Chlorhexidine  Gluconate Cloth 2 % PADS 6 each, 6 each, Topical, Daily, Niu, Xilin, MD, 6 each at 11/24/23 1047   dextromethorphan-guaiFENesin  (MUCINEX  DM) 30-600 MG per 12 hr tablet 1 tablet, 1 tablet, Oral, BID PRN, Niu, Xilin, MD   gabapentin  (NEURONTIN ) capsule 100 mg, 100 mg, Oral, TID, Niu, Xilin, MD, 100 mg at 11/24/23 1045   hydrALAZINE  (APRESOLINE ) injection 5 mg, 5 mg, Intravenous, Q2H PRN, Niu, Xilin, MD   LORazepam  (ATIVAN ) injection 1 mg, 1 mg, Intravenous, Q4H PRN, Jessup, Charles, MD   metoprolol  tartrate (LOPRESSOR ) tablet 100 mg, 100 mg, Oral, BID, Niu, Xilin, MD, 100 mg at 11/24/23 1045   morphine  (PF) 2 MG/ML injection 2 mg, 2 mg, Intravenous, Q4H PRN, Niu, Xilin, MD, 2 mg at 11/24/23 1027   multivitamin with minerals tablet 1 tablet, 1 tablet, Oral, Daily, Niu, Xilin, MD, 1 tablet at 11/24/23 1045   ondansetron  (ZOFRAN ) injection 4 mg, 4 mg, Intravenous, Q8H PRN, Niu, Xilin, MD   oxyCODONE -acetaminophen  (PERCOCET/ROXICET) 5-325 MG per tablet 1-2 tablet, 1-2 tablet, Oral, Q4H PRN, Margery Sheets B, MD, 2 tablet at 11/24/23 1045   sodium chloride  flush (NS) 0.9 % injection 10-40 mL, 10-40 mL, Intracatheter, Q12H, Niu, Xilin, MD, 10 mL at 11/24/23 1047   sodium chloride  flush (NS) 0.9 % injection 10-40 mL, 10-40 mL, Intracatheter, PRN, Niu, Xilin, MD   sodium zirconium cyclosilicate  (LOKELMA ) packet 10 g, 10 g, Oral, Once, Niu, Xilin, MD   vancomycin  (VANCOREADY) IVPB 750 mg/150 mL, 750 mg, Intravenous, Q12H, Zeigler, Dustin G, RPH   Social History: Social History   Tobacco Use   Smoking status: Former    Current packs/day: 0.50    Average packs/day: 0.5 packs/day for 6.0 years (3.0 ttl pk-yrs)    Types: Cigarettes   Smokeless tobacco: Never   Tobacco comments:    Quit 8/22  Vaping Use   Vaping status: Every Day   Substances: Nicotine  Substance Use Topics   Alcohol  use: Not Currently    Comment: 3 times per week   Drug use: Never    Family Medical History: Family History  Problem Relation Age of Onset   Colon cancer Neg Hx     Physical Examination: Vitals:   11/24/23 0931 11/24/23 1312  BP: (!) 169/102 (!) 132/98  Pulse: 93 84  Resp: 18 (!) 23  Temp: 98.4 F (36.9 C) 99 F (37.2 C)  SpO2: 100% 100%     General: Patient is well developed, well nourished, calm, collected, and in no apparent distress.  NEUROLOGICAL:  General: In no acute distress.   Awake, alert, oriented to person, place, and time.  Pupils equal round and reactive to light.  Facial tone is symmetric.  Tongue protrusion is midline.  There is no pronator drift.  Tenderness to palpation in midline thoracic spine  Strength: Side Biceps Triceps Deltoid Interossei Grip Wrist Ext. Wrist Flex.  R 5 5 5 5 5 5 5   L 5 5 5 5 5 5 5    Side Iliopsoas Quads Hamstring PF DF EHL  R 4+ 5 5 4+ 4+ 4+  L 4+ 5 5 5 5 5     Bilateral upper and lower extremity sensation is intact to light touch.  Reflexes are she is hyperreflexic at the bilateral patellas, has an upgoing toe on the right.  Crossed adductor present.  No clear clonus  Imaging: MR THORACIC SPINE W WO CONTRAST Result Date: 11/23/2023 CLINICAL DATA:  Follow-up discitis/osteomyelitis at T6-7. EXAM: MRI THORACIC WITHOUT AND WITH CONTRAST TECHNIQUE: Multiplanar and multiecho pulse sequences of the thoracic spine were obtained without and with intravenous contrast. CONTRAST:  5mL GADAVIST  GADOBUTROL  1 MMOL/ML IV SOLN COMPARISON:  MRI 10/23/2023 CT scan 11/23/2023 FINDINGS: Alignment: Normal overall alignment of the thoracic vertebral bodies. Vertebrae: Diffuse T1 and T2 signal intensity in the T6 and T7 vertebral bodies and further pathologic compression deformities of T6 and T7. The disc space is severely narrowed. There is also fluid in the disc space and diffuse abnormal contrast enhancement. There is also abnormal signal intensity  in the T8 vertebral body and marked narrowing of the T7-8 disc space. Findings suspicious for discitis and osteomyelitis at this level also. Associated epidural abscess and extensive epidural enhancing inflammatory phlegmon surrounding and compressing the thoracic spinal cord. This extends from T5-6-2 T8-9. Is also abnormal signal intensity in the T8 vertebral body and marked narrowing of the T7-8 disc space. Findings suspicious for discitis and osteomyelitis at this level also. Cord: No definite cord lesions or syrinx. No abnormal cord enhancement. Paraspinal and other soft tissues: Extensive paraspinal abscess and inflammatory phlegmon extending from T5-6 down to T8-9. Disc levels: No significant findings above the T5-6. As above there is extensive discitis and osteomyelitis at T6-7 and to a lesser extent at T7-8 with paraspinal and epidural abscesses. Degenerative disc disease with bulging discs and osteophytic ridging in the lower thoracic intervertebral disc spaces. No other sites of discitis or osteomyelitis are identified. Incidental note is made of Abner T1 and T2 signal intensity in the L2 vertebral body along with abnormal contrast enhancement. Could not exclude osteomyelitis in the L2 vertebral body. IMPRESSION: 1. Extensive progressive discitis and osteomyelitis at T6-7 and to a lesser extent at T7-8 with paraspinal and epidural abscesses. 2. Extensive epidural abscess and extensive epidural enhancing inflammatory phlegmon surrounding and compressing the thoracic spinal cord at the above levels. 3. No definite cord lesions or syrinx. No abnormal cord enhancement. 4. Incidental note is made of abnormal T1 and T2 signal intensity and abnormal enhancement in the L2 vertebral body suspicious for osteomyelitis. These results will be called to the ordering clinician or representative by the Radiologist Assistant, and communication documented in the PACS or [employer]. Electronically Signed   By: Marrian Siva M.D.   On: 11/23/2023 21:30   CT T-SPINE NO CHARGE Result Date: 11/23/2023 CLINICAL DATA:  Back pain.  No known injury. EXAM: CT THORACIC SPINE WITHOUT CONTRAST TECHNIQUE: Multidetector CT images of thoracic was performed according to the standard protocol following intravenous contrast administration. RADIATION DOSE REDUCTION: This exam was performed according to the departmental dose-optimization program which includes automated exposure control, adjustment of the mA and/or kV according to patient size and/or use of iterative reconstruction technique. CONTRAST:  75mL OMNIPAQUE  IOHEXOL  350 MG/ML SOLN COMPARISON:  Chest CT dated 11/23/2023 and thoracic spine radiograph dated 11/21/2023. Thoracic spine MRI dated 10/23/2023. FINDINGS: Alignment: No acute subluxation. Vertebrae: No acute fracture. Paraspinal and other soft tissues: There is prevertebral soft tissue thickening centered at T6-T7 with inflammatory changes extending into the posterior mediastinum and surrounding the descending thoracic aorta.  No drainable fluid collection or abscess. Disc levels: There is disc space narrowing and endplate irregularity at T6-T9. Progression of endplate irregularity and fragmentation with disc space narrowing at T6-T7. There is slight anterior wedging of T7. Findings consistent with osteomyelitis/discitis centered at T6-T7. Further evaluation with MRI without and with contrast recommended. IMPRESSION: Findings consistent with osteomyelitis/discitis centered at T6-T7. Further evaluation with MRI without and with contrast recommended. Electronically Signed   By: Angus Bark M.D.   On: 11/23/2023 19:05   CT Angio Chest PE W/Cm &/Or Wo Cm Result Date: 11/23/2023 CLINICAL DATA:  Concern for pulmonary disease. EXAM: CT ANGIOGRAPHY CHEST WITH CONTRAST TECHNIQUE: Multidetector CT imaging of the chest was performed using the standard protocol during bolus administration of intravenous contrast. Multiplanar CT image  reconstructions and MIPs were obtained to evaluate the vascular anatomy. RADIATION DOSE REDUCTION: This exam was performed according to the departmental dose-optimization program which includes automated exposure control, adjustment of the mA and/or kV according to patient size and/or use of iterative reconstruction technique. CONTRAST:  75mL OMNIPAQUE  IOHEXOL  350 MG/ML SOLN COMPARISON:  Chest radiograph dated 11/23/2023. FINDINGS: Cardiovascular: Top-normal cardiac size. No pericardial effusion. The thoracic aorta is unremarkable. The origins of the great vessels of the aortic arch appear patent as visualized. No pulmonary artery embolus identified. Mediastinum/Nodes: Mildly enlarged right hilar lymph nodes measure 11 mm short axis. The esophagus is grossly unremarkable. No mediastinal fluid collection. Lungs/Pleura: There are bibasilar linear atelectasis. No focal consolidation, pleural effusion, or pneumothorax. The central airways are patent. Upper Abdomen: No acute abnormality. Musculoskeletal: There is disc space narrowing and endplate irregularity and fragmentation predominantly at T6-T7. There is demineralization of the vertebra along the T6-T7 disc with mild anterior wedging of T7. There is progression of disc space narrowing and fragmentation at T6-T7 since the MRI of 10/23/2023. Findings most consistent with spondylo discitis. Further evaluation with MRI without and with contrast recommended. There is thickening of the prevertebral tissues at T6-T7 with inflammatory changes extending into the posterior mediastinum and surrounding the descending thoracic aorta. No drainable fluid collection or abscess noted. Review of the MIP images confirms the above findings. IMPRESSION: 1. No CT evidence of pulmonary artery embolus. 2. 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 Further evaluation with MRI without and with contrast recommended. Electronically  Signed   By: Angus Bark M.D.   On: 11/23/2023 19:01   DG Chest 2 View Result Date: 11/23/2023 CLINICAL DATA:  Chest pain, muscle spasms EXAM: CHEST - 2 VIEW COMPARISON:  10/28/2023, 10/23/2023 FINDINGS: Frontal and lateral views of the chest demonstrate a stable cardiac silhouette. Right-sided PICC tip within the superior vena cava. Linear areas of consolidation within the left mid and right lower lung zones consistent with subsegmental atelectasis. No airspace disease, effusion, or pneumothorax. There is progressive disc space narrowing within the midthoracic spine, with anterior wedging at the approximally T6 level which appears new since prior MRI. Given previous MRI findings, progressive discitis/osteomyelitis is suspected. IMPRESSION: 1. New anterior wedging within the midthoracic spine at approximately the T6 level, with progressive loss of disc space height, concerning for progressive discitis/osteomyelitis given previous MRI findings. Repeat MRI imaging may be useful. 2. Scattered areas of subsegmental atelectasis at the lung bases. Electronically Signed   By: Bobbye Burrow M.D.   On: 11/23/2023 16:32     I have personally reviewed the images and agree with the above interpretation.  In addition, she has a circumferential epidural abscess  with compression of her spinal cord.  There is extensive posterior and ventral contrast-enhancement.  She also has evidence of a progression of kyphotic deformity at these levels as seen on her x-rays and in comparison to her last visit.  MRI also shows worsening progression of a thoracic kyphotic deformity.  Labs:    Latest Ref Rng & Units 11/24/2023    5:59 AM 11/23/2023    3:49 PM 11/04/2023    4:32 AM  CBC  WBC 4.0 - 10.5 K/uL 10.9  13.1  8.9   Hemoglobin 12.0 - 15.0 g/dL 16.1  09.6  9.9   Hematocrit 36.0 - 46.0 % 31.1  34.7  30.3   Platelets 150 - 400 K/uL 680  730  837       Latest Ref Rng & Units 11/24/2023    5:59 AM 11/23/2023    3:49 PM  11/04/2023    4:32 AM  BMP  Glucose 70 - 99 mg/dL 045  409  811   BUN 6 - 20 mg/dL 15  20  16    Creatinine 0.44 - 1.00 mg/dL 9.14  7.82  9.56   Sodium 135 - 145 mmol/L 135  133  133   Potassium 3.5 - 5.1 mmol/L 4.0  5.6  5.1   Chloride 98 - 111 mmol/L 107  103  100   CO2 22 - 32 mmol/L 20  19  24    Calcium 8.9 - 10.3 mg/dL 9.2  21.3  9.4     INR  1.1 (02/09 2155)   Assessment and Plan: Ms. Downham is a pleasant 48 y.o. female with presentation consistent with thoracic epidural abscess.  She has had a history of MRSA bacteremia and previous osteomyelitis with a history of a pancreatitis.  She since has had significant worsening of her back pain which radiates around her bilateral chest just below her nipple line.  She does have decree sensation in her right lower extremity and is hyperreflexic as well.  She does have a history of transverse myelitis but states that she did not have longstanding symptoms.  At her last admission approximately 1 month ago she had early signs of discitis and was treated extensively with continuous antibiotics.  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  She does have signs of early myelopathy with some mild right lower extremity weakness that is prominent in her hip flexors and ankle musculature, she also has some hyperreflexia noted in her right lower extremity with some pathologic reflexes.  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  Given all of these findings together we feel that she would benefit from a thoracic decompression, transpedicular decompression and disc debridement with a anterior and posterior lateral fusion, and posterior instrumentation.  This is in hopes to keep her spinal cord compression from progressing and decreasing the risk of infectious thrombophlebitis of the spine which could cause loss of lower  extremity function.  We discussed this case with Dr. Francee Inch from infectious disease who feels that in the setting of progressive worsening of her infection that she meets criteria for surgical evacuation.  We discussed the risks and benefits of the procedure with the patient, she does have a history of nicotine exposure with vaping, but given the emergent nature of her problem we will go forward with surgical fixation.  Will likely use postoperative bracing to help her healing process.  We did  discuss with her that she will likely be on antibiotics for a very extended time potentially for life.   Carroll Clamp, MD/MSCR Dept. of Neurosurgery

## 2023-11-24 NOTE — Consult Note (Signed)
 NAME: Danielle Harrison  DOB: 12-Apr-1976  MRN: [id number]  Date/Time: 11/24/2023 11:10 AM  REQUESTING PROVIDER: Dr.Sreenath Subjective:  REASON FOR CONSULT: MRSA vertebral osteo, epidural abscess ? Danielle Harrison is a 48 y.o. female with h/o transverse myelitis in 2011 , chronic low back pain, pancreatitis, GERD, anxiety, asthma Was recently in the hospital between 10/18/2023 until 11/04/2023 for acute pancreatitis but also developed MRSA bacteremia 5 days into hospital stay ( BC positive on 1/9, 1/11 and 1/12) and T8-T9 thoracic vertebral osteomyelitis/discitis and left great toe septic arthritis for which she underwent Left foot first MTP joint resection including head of the first metatarsal and base of proximal phalanx.  This was done on 10/30/2023.  Culture was positive for MRSA.  TEE was negative on 10/29/23.  Repeat blood culture done on 10/30/2023 was negative for MRSA.  Patient was discharged on daptomycin  IV for minimum of 6 weeks.  The daptomycin  MIC was susceptible at 0.5 mcg.  Patient did not have insurance and she was a self-pay.   She presented to urgent care on 11/16/2023 with muscle spasm in her chest.  No workup was done that day.  She was prescribed prednisone  and heating pad and wrapping of the chest wall.   She saw her PCP on 11/21/2023 and was still complaining of pain and she ordered a thoracic spine x-ray which showed progressive disc space narrowing and wedging of T6 and T7 worrisome for discitis and osteomyelitis and pathological compression fracture.   She came back to [facility] ED on 11/23/2023 complaining of worsening pain and it was hurting for her to talk turn twist or move.She ahs no fever or chills She has been 100% adherent to Daptomycin   No pain abdomen Left great toe surgical site has healed and she has no pain   11/23/23  BP 152/107 (H)  Temp 98.8 F (37.1 C)  Pulse Rate 91  Resp 18  SpO2 100 %    Latest Reference Range & Units 11/23/23  WBC 4.0 - 10.5 K/uL 13.1 (H)   Hemoglobin 12.0 - 15.0 g/dL 82.9 (L)  HCT 56.2 - 13.0 % 34.7 (L)  Platelets 150 - 400 K/uL 730 (H)  Creatinine 0.44 - 1.00 mg/dL 8.65   BC sent on 04/20/45 MRI of thoracic/ spine showed extensive discitis and osteo at t6-T7 and t7-T8 to a lesser extent with paraspinal and epidural abscess. Compression of thoracic cord at this level L2 vertebral body suspicious for osteomyelitis Past Medical History:  Diagnosis Date   Anxiety    Arthritis    back   Asthma    HTN (hypertension)    Neuromuscular disorder (HCC)    weakness and numbness    Restless leg syndrome     Past Surgical History:  Procedure Laterality Date   BIOPSY  06/28/2019   Procedure: BIOPSY;  Surgeon: Irby Mannan, MD;  Location: [facility];  Service: Endoscopy;;   CESAREAN SECTION     x2   COLONOSCOPY WITH PROPOFOL  N/A 06/28/2019   Procedure: COLONOSCOPY WITH PROPOFOL ;  Surgeon: Irby Mannan, MD;  Location: [facility];  Service: Endoscopy;  Laterality: N/A;   ESOPHAGOGASTRODUODENOSCOPY (EGD) WITH PROPOFOL  N/A 06/28/2019   Procedure: ESOPHAGOGASTRODUODENOSCOPY (EGD) WITH PROPOFOL ;  Surgeon: Irby Mannan, MD;  Location: [facility];  Service: Endoscopy;  Laterality: N/A;   INCISION AND DRAINAGE Left 10/27/2023   Procedure: INCISION AND DRAINAGE LEFT FOOT;  Surgeon: Evertt Hoe, DPM;  Location: [facility];  Service: Orthopedics/Podiatry;  Laterality:  Left;   INCISION AND DRAINAGE Left 10/30/2023   Procedure: LEFT FOOT FIRST METATARSAL PHALANGEAL JOINT RESECTION, ANTIBIOTIC SPACER, WASHOUT, WOUND CLOSURE;  Surgeon: Evertt Hoe, DPM;  Location: [facility];  Service: Orthopedics/Podiatry;  Laterality: Left;   TEE WITHOUT CARDIOVERSION N/A 10/29/2023   Procedure: TRANSESOPHAGEAL ECHOCARDIOGRAM (TEE);  Surgeon: Devorah Fonder, MD;  Location: [facility];  Service: Cardiovascular;  Laterality: N/A;   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   Procedure: LEFT  FOOT 1ST MPJ RESECTION;  Surgeon: Evertt Hoe, DPM;  Location: [facility];  Service: Orthopedics/Podiatry;  Laterality: Left;   TRANSMETATARSAL AMPUTATION Left 10/30/2023   Procedure: LEFT FOOT 1ST MPJ RESECTION;  Surgeon: Evertt Hoe, DPM;  Location: [facility];  Service: Orthopedics/Podiatry;  Laterality: Left;    Social History   Socioeconomic History   Marital status: Married    Spouse name: Not on file   Number of children: Not on file   Years of education: Not on file   Highest education level: Not on file  Occupational History   Not on file  Tobacco Use   Smoking status: Former    Current packs/day: 0.50    Average packs/day: 0.5 packs/day for 6.0 years (3.0 ttl pk-yrs)    Types: Cigarettes   Smokeless tobacco: Never   Tobacco comments:    Quit 8/22  Vaping Use   Vaping status: Every Day   Substances: Nicotine  Substance and Sexual Activity   Alcohol use: Not Currently    Comment: 3 times per week   Drug use: Never   Sexual activity: Not on file  Other Topics Concern   Not on file  Social History Narrative   Not on file   Social Drivers of Health   Financial Resource Strain: Not on file  Food Insecurity: No Food Insecurity (11/24/2023)   Hunger Vital Sign    Worried About Running Out of Food in the Last Year: Never true    Ran Out of Food in the Last Year: Never true  Transportation Needs: No Transportation Needs (11/24/2023)   PRAPARE - Administrator, Civil Service (Medical): No    Lack of Transportation (Non-Medical): No  Physical Activity: Not on file  Stress: Not on file  Social Connections: Not on file  Intimate Partner Violence: Not At Risk (11/24/2023)   Humiliation, Afraid, Rape, and Kick questionnaire    Fear of Current or Ex-Partner: No    Emotionally Abused: No    Physically Abused: No    Sexually Abused: No    Family History  Problem Relation Age of Onset   Colon cancer Neg Hx    Allergies  Allergen Reactions    Diclofenac Hives   I? Current Facility-Administered Medications  Medication Dose Route Frequency Provider Last Rate Last Admin   0.9 %  sodium chloride  infusion   Intravenous Continuous Niu, Xilin, MD 75 mL/hr at 11/24/23 0328 Infusion Verify at 11/24/23 1610   acetaminophen  (TYLENOL ) tablet 650 mg  650 mg Oral Q6H PRN Niu, Xilin, MD       albuterol  (PROVENTIL ) (2.5 MG/3ML) 0.083% nebulizer solution 2.5 mg  2.5 mg Inhalation Q4H PRN Niu, Xilin, MD       baclofen  (LIORESAL ) tablet 10 mg  10 mg Oral Daily Niu, Xilin, MD   10 mg at 11/24/23 1045   bisacodyl  (DULCOLAX) EC tablet 10 mg  10 mg Oral QHS PRN Niu, Xilin, MD       ceFEPIme  (MAXIPIME ) 2 g in sodium chloride  0.9 %  100 mL IVPB  2 g Intravenous Q8H Hunt, Madison H, RPH       Chlorhexidine  Gluconate Cloth 2 % PADS 6 each  6 each Topical Daily Niu, Xilin, MD   6 each at 11/24/23 1047   dextromethorphan-guaiFENesin  (MUCINEX  DM) 30-600 MG per 12 hr tablet 1 tablet  1 tablet Oral BID PRN Niu, Xilin, MD       gabapentin  (NEURONTIN ) capsule 100 mg  100 mg Oral TID Niu, Xilin, MD   100 mg at 11/24/23 1045   hydrALAZINE  (APRESOLINE ) injection 5 mg  5 mg Intravenous Q2H PRN Niu, Xilin, MD       LORazepam  (ATIVAN ) injection 1 mg  1 mg Intravenous Q4H PRN Jessup, Charles, MD       metoprolol  tartrate (LOPRESSOR ) tablet 100 mg  100 mg Oral BID Niu, Xilin, MD   100 mg at 11/24/23 1045   morphine  (PF) 2 MG/ML injection 2 mg  2 mg Intravenous Q4H PRN Niu, Xilin, MD   2 mg at 11/24/23 2956   multivitamin with minerals tablet 1 tablet  1 tablet Oral Daily Niu, Xilin, MD   1 tablet at 11/24/23 1045   ondansetron  (ZOFRAN ) injection 4 mg  4 mg Intravenous Q8H PRN Niu, Xilin, MD       oxyCODONE -acetaminophen  (PERCOCET/ROXICET) 5-325 MG per tablet 1-2 tablet  1-2 tablet Oral Q4H PRN Sreenath, Sudheer B, MD   2 tablet at 11/24/23 1045   sodium chloride  flush (NS) 0.9 % injection 10-40 mL  10-40 mL Intracatheter Q12H Niu, Xilin, MD   10 mL at 11/24/23 1047    sodium chloride  flush (NS) 0.9 % injection 10-40 mL  10-40 mL Intracatheter PRN Niu, Xilin, MD       sodium zirconium cyclosilicate  (LOKELMA ) packet 10 g  10 g Oral Once Niu, Xilin, MD       vancomycin  (VANCOCIN ) IVPB 1000 mg/200 mL premix  1,000 mg Intravenous Q24H Hunt, Madison H, RPH         Abtx:  Anti-infectives (From admission, onward)    Start     Dose/Rate Route Frequency Ordered Stop   11/24/23 2200  vancomycin  (VANCOCIN ) IVPB 1000 mg/200 mL premix  Status:  Discontinued        1,000 mg 200 mL/hr over 60 Minutes Intravenous Every 24 hours 11/23/23 2030 11/24/23 0800   11/24/23 2000  vancomycin  (VANCOCIN ) IVPB 1000 mg/200 mL premix        1,000 mg 200 mL/hr over 60 Minutes Intravenous Every 24 hours 11/24/23 1033     11/24/23 1200  ceFEPIme  (MAXIPIME ) 2 g in sodium chloride  0.9 % 100 mL IVPB        2 g 200 mL/hr over 30 Minutes Intravenous Every 8 hours 11/24/23 1033     11/24/23 0600  ceFEPIme  (MAXIPIME ) 2 g in sodium chloride  0.9 % 100 mL IVPB  Status:  Discontinued        2 g 200 mL/hr over 30 Minutes Intravenous Every 8 hours 11/23/23 2030 11/24/23 0727   11/23/23 1945  vancomycin  (VANCOCIN ) IVPB 1000 mg/200 mL premix        1,000 mg 200 mL/hr over 60 Minutes Intravenous  Once 11/23/23 1934 11/24/23 0126   11/23/23 1945  ceFEPIme  (MAXIPIME ) 2 g in sodium chloride  0.9 % 100 mL IVPB        2 g 200 mL/hr over 30 Minutes Intravenous  Once 11/23/23 1934 11/23/23 2256       REVIEW OF SYSTEMS:  Const: negative  fever, negative chills, negative weight loss Eyes: negative diplopia or visual changes, negative eye pain ENT: negative coryza, negative sore throat Resp: negative cough, hemoptysis, dyspnea Cards: pain across chest from back GU: negative for frequency, dysuria and hematuria GI: Negative for abdominal pain, diarrhea, bleeding, constipation, appetit good Skin: negative for rash and pruritus Heme: negative for easy bruising and gum/nose bleeding MS: negative for  myalgias, arthralgias, back pain and muscle weakness Neurolo:negative for headaches, dizziness, vertigo, memory problems  Psych: negative for feelings of anxiety, depression  Endocrine: negative for thyroid, diabetes Allergy/Immunology- negative for any medication or food allergies ?  Objective:  VITALS:  BP (!) 169/102 (BP Location: Left Arm)   Pulse 93   Temp 98.4 F (36.9 C)   Resp 18   Ht 5' (1.524 m)   Wt 49.9 kg   LMP 06/01/2022 (Exact Date)   SpO2 100%   BMI 21.48 kg/m  LDA Rt PICC PHYSICAL EXAM:  General: Alert, cooperative, some distress when she moves  appears stated age.  Head: Normocephalic, without obvious abnormality, atraumatic. Eyes: Conjunctivae clear, anicteric sclerae. Pupils are equal ENT Nares normal. No drainage or sinus tenderness. Lips, mucosa, and tongue normal. No Thrush Neck: Supple, symmetrical, no adenopathy, thyroid: non tender no carotid bruit and no JVD. Back: No CVA tenderness. Lungs: Clear to auscultation bilaterally. No Wheezing or Rhonchi. No rales. Heart: Regular rate and rhythm, no murmur, rub or gallop. Abdomen: Soft, non-tender,not distended. Bowel sounds normal. No masses Extremities: left great toe surgical site- sutures present, but no erythema or swelling  Skin: No rashes or lesions. Or bruising Lymph: Cervical, supraclavicular normal. Neurologic: Grossly non-focal Pertinent Labs Lab Results CBC    Component Value Date/Time   WBC 10.9 (H) 11/24/2023 0559   RBC 3.26 (L) 11/24/2023 0559   HGB 10.0 (L) 11/24/2023 0559   HGB 14.2 03/01/2014 1059   HCT 31.1 (L) 11/24/2023 0559   HCT 42.1 03/01/2014 1059   PLT 680 (H) 11/24/2023 0559   PLT 324 03/01/2014 1059   MCV 95.4 11/24/2023 0559   MCV 96 03/01/2014 1059   MCH 30.7 11/24/2023 0559   MCHC 32.2 11/24/2023 0559   RDW 13.5 11/24/2023 0559   RDW 12.5 03/01/2014 1059   LYMPHSABS 4.2 (H) 06/02/2022 1802   LYMPHSABS 2.0 03/01/2014 1059   MONOABS 0.7 06/02/2022 1802    MONOABS 0.5 03/01/2014 1059   EOSABS 0.4 06/02/2022 1802   EOSABS 0.1 03/01/2014 1059   BASOSABS 0.1 06/02/2022 1802   BASOSABS 0.1 03/01/2014 1059       Latest Ref Rng & Units 11/24/2023    5:59 AM 11/23/2023    3:49 PM 11/04/2023    4:32 AM  CMP  Glucose 70 - 99 mg/dL 161  096  045   BUN 6 - 20 mg/dL 15  20  16    Creatinine 0.44 - 1.00 mg/dL 4.09  8.11  9.14   Sodium 135 - 145 mmol/L 135  133  133   Potassium 3.5 - 5.1 mmol/L 4.0  5.6  5.1   Chloride 98 - 111 mmol/L 107  103  100   CO2 22 - 32 mmol/L 20  19  24    Calcium 8.9 - 10.3 mg/dL 9.2  78.2  9.4   Total Protein 6.5 - 8.1 g/dL  8.6    Total Bilirubin 0.0 - 1.2 mg/dL  0.7    Alkaline Phos 38 - 126 U/L  105    AST 15 - 41 U/L  25  ALT 0 - 44 U/L  23        Microbiology: Recent Results (from the past 240 hours)  Culture, blood (routine x 2)     Status: None (Preliminary result)   Collection Time: 11/23/23  5:29 PM   Specimen: BLOOD  Result Value Ref Range Status   Specimen Description BLOOD BLOOD LEFT FOREARM  Final   Special Requests   Final    BOTTLES DRAWN AEROBIC AND ANAEROBIC Blood Culture results may not be optimal due to an excessive volume of blood received in culture bottles   Culture   Final    NO GROWTH < 12 HOURS Performed at [facility], [address] [place], [place] [address]    Report Status PENDING  Incomplete  Culture, blood (routine x 2)     Status: None (Preliminary result)   Collection Time: 11/23/23  5:29 PM   Specimen: BLOOD  Result Value Ref Range Status   Specimen Description BLOOD BLOOD RIGHT ARM  Final   Special Requests   Final    BOTTLES DRAWN AEROBIC AND ANAEROBIC Blood Culture results may not be optimal due to an inadequate volume of blood received in culture bottles   Culture   Final    NO GROWTH < 12 HOURS Performed at [facility], [address], [place], [place] [address]    Report Status PENDING  Incomplete    IMAGING RESULTS:    I have  personally reviewed the films ? Extensive progressive discitis and osteomyelitis at T6-7 and to a lesser extent at T7-8 with paraspinal and epidural abscesses. 2. Extensive epidural abscess and extensive epidural enhancing inflammatory phlegmon surrounding and compressing the thoracic spinal cord at the above levels.   Impression/Recommendation MRSA bacteremia in Jan 2025 with T6-T7 discitis  and left great toe septic arthritis and osteo She has been on appropriate antibiotic since last admission and it is now 223 days of Iv daptomycin  and there is progression of the vertebral osteomyelitis and now with epidural abscess and nerve compression causing severe pain. She needs surgical intervention for symptoms and also to reduce bioburden Sed rate and crp have improved from last month Pt has been started on vancomycin - dose aggressively to achieve therapeutic levels quickly Pt is going to need rifampin  after surgery along with vanco  Anemia  Thrombocytosis due to infection  Left great toe infeciton with septic arthritis and osteo of the Met- s/p resection- site has healed well Sutures will have to be removed  H/o pancreatitis- had small pseudocysts in CT from 1/14 She is now totally asymptomatic May repeat CT to look at the size of the pseudocyst?/  ? ? I have personally spent  -60--minutes involved in face-to-face and non-face-to-face activities for this patient on the day of the visit. Professional time spent includes the following activities: Preparing to see the patient (review of tests), Obtaining and/or reviewing separately obtained history (admission/discharge record), Performing a medically appropriate examination and/or evaluation , Ordering medications/tests/procedures, referring and communicating with other health care professionals, Documenting clinical information in the EMR, Independently interpreting results (not separately reported), Communicating results to the  patient/family/caregiver, Counseling and educating the patient/family/caregiver and Care coordination (not separately reported).    ________________________________________________ Discussed with patient, and care team Note:  This document was prepared using Dragon voice recognition software and may include unintentional dictation errors.

## 2023-11-24 NOTE — Progress Notes (Signed)
 PROGRESS NOTE    Danielle Harrison  ZOX:[id number] DOB: 1976-08-08 DOA: 11/23/2023 PCP: [name], NP    Brief Narrative:   48 y.o. 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 Pt was discharged on daptomycin  given through PICC line in right arm. Pt states that her left great toe pain has significantly improved.  No more abdominal pain, nausea vomiting or diarrhea.     She continues to have muscle spasm and increasing pain across her whole front chest. Also has middle back pain.  Her chest pain is sharp, severe, pleuritic, aggravated by deep breath.  Associated with shortness of breath.  No cough, fever or chills.  No loss control of bladder or bowel movement.  Denies symptoms of UTI.  Patient states that she did not drink alcohol since last admission.  Patient denies drug use.   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  No lower extremity numbness or weakness. Consulted Dr. Ada Holler of neurosurgery, he recommended to continue antibiotics and get ID consult.  Since no alarming symptoms  now, patient can be observed safely on antibiotics.  He recommended to repeat MRI in 48 hours.  Patient has WBC 13.1, lactic acid 3.4, but no fever.  Clinically does not seem to have sepsis.  Plan: Discussed with neurosurgery, ID, interventional radiology.  Plan to hold antibiotics engage IR for CT-guided aspiration today.  Post procedurally can consider restarting antibiotics.  Appreciate involvement from consultation services.  Asthma: Stable -As needed Mucinex , bronchodilators    HTN (hypertension) -IV hydralazine  as needed -Metoprolol    Hyperkalemia: Potassium 5.6 Received 10 g load, with improvement in serum potassium   Recent septic arthritis of left foot (HCC): s/p of washout.  Surgical site is healing well -On antibiotics as above   DVT prophylaxis: SCD Code Status: Full Family Communication:None Disposition Plan: Status is: Inpatient Remains inpatient appropriate because: Spinal osteomyelitis   Level of care: Telemetry Medical  Consultants:  Neurosurgery IR ID  Procedures:  CT-guided spinal aspiration 2/10  Antimicrobials: Vancomycin , currently on hold   Subjective: Seen and examined.  Sitting up in bed.  Appears in mild distress due to pain.  Objective: Vitals:   11/23/23 2030 11/23/23 2124 11/23/23 2204 11/24/23 0931  BP: (!) 150/101  (!) 152/107 (!) 169/102  Pulse: 93  91 93  Resp:   18 18  Temp:  98.5 F (36.9 C) 98.8 F (37.1 C) 98.4 F (36.9 C)  TempSrc:  Oral    SpO2: 99%  100% 100%  Weight:      Height:        Intake/Output Summary (Last 24 hours) at 11/24/2023 1212 Last data filed at 11/24/2023 0928 Gross per 24  hour  Intake 489.01 ml  Output 1 ml  Net 488.01 ml   Filed Weights   11/23/23 1544  Weight: 49.9 kg    Examination:  General exam: NAD.  Appears fatigued Respiratory system: Lungs clear.  Normal breathing.  Room air Cardiovascular system: S1-S2, RRR, no murmurs, no pedal edema Gastrointestinal system: Soft, NT/ND,  normal bowel sounds Central nervous system: Alert and oriented. No focal neurological deficits. Extremities: Decreased power bilateral lower extremities.  Gait not assessed Skin: No rashes, lesions or ulcers Psychiatry: Judgement and insight appear normal. Mood & affect appropriate.     Data Reviewed: I have personally reviewed following labs and imaging studies  CBC: Recent Labs  Lab 11/23/23 1549 11/24/23 0559  WBC 13.1* 10.9*  HGB 11.1* 10.0*  HCT 34.7* 31.1*  MCV 95.6 95.4  PLT 730* 680*   Basic Metabolic Panel: Recent Labs  Lab 11/23/23 1549 11/24/23 0559  NA 133* 135  K 5.6* 4.0  CL 103 107  CO2 19* 20*  GLUCOSE 176* 104*  BUN 20 15  CREATININE 0.56 0.49  CALCIUM 10.0 9.2   GFR: Estimated Creatinine Clearance: 62.4 mL/min (by C-G formula based on SCr of 0.49 mg/dL). Liver Function Tests: Recent Labs  Lab 11/23/23 1549  AST 25  ALT 23  ALKPHOS 105  BILITOT 0.7  PROT 8.6*  ALBUMIN 4.0   Recent Labs  Lab 11/23/23 1549  LIPASE 21   No results for input(s): "AMMONIA" in the last 168 hours. Coagulation Profile: Recent Labs  Lab 11/23/23 2155  INR 1.1   Cardiac Enzymes: No results for input(s): "CKTOTAL", "CKMB", "CKMBINDEX", "TROPONINI" in the last 168 hours. BNP (last 3 results) No results for input(s): "PROBNP" in the last 8760 hours. HbA1C: No results for input(s): "HGBA1C" in the last 72 hours. CBG: No results for input(s): "GLUCAP" in the last 168 hours. Lipid Profile: No results for input(s): "CHOL", "HDL", "LDLCALC", "TRIG", "CHOLHDL", "LDLDIRECT" in the last 72 hours. Thyroid Function Tests: No results for input(s): "TSH", "T4TOTAL", "FREET4", "T3FREE", "THYROIDAB" in the last 72 hours. Anemia Panel: No results for input(s): "VITAMINB12", "FOLATE", "FERRITIN", "TIBC", "IRON", "RETICCTPCT" in the last 72 hours. Sepsis Labs: Recent Labs  Lab 11/23/23 1549 11/23/23 1725 11/23/23 2136  PROCALCITON <0.10  --   --   LATICACIDVEN  --   3.4* 2.1*    Recent Results (from the past 240 hours)  Culture, blood (routine x 2)     Status: None (Preliminary result)   Collection Time: 11/23/23  5:29 PM   Specimen: BLOOD  Result Value Ref Range Status   Specimen Description BLOOD BLOOD LEFT FOREARM  Final   Special Requests   Final    BOTTLES DRAWN AEROBIC AND ANAEROBIC Blood Culture results may not be optimal due to an excessive volume of blood received in culture bottles   Culture   Final    NO GROWTH < 12 HOURS Performed at [facility], [address], [place], [place] [address]    Report Status PENDING  Incomplete  Culture, blood (routine x 2)     Status: None (Preliminary result)   Collection Time: 11/23/23  5:29 PM   Specimen: BLOOD  Result Value Ref Range Status   Specimen Description BLOOD BLOOD RIGHT ARM  Final   Special Requests   Final    BOTTLES DRAWN AEROBIC AND ANAEROBIC Blood Culture results may not be optimal due to an inadequate volume of blood received in culture bottles   Culture   Final  NO GROWTH < 12 HOURS Performed at [facility], [address], [place], [place] [address]    Report Status PENDING  Incomplete         Radiology Studies: MR THORACIC SPINE W WO CONTRAST Result Date: 11/23/2023 CLINICAL DATA:  Follow-up discitis/osteomyelitis at T6-7. EXAM: MRI THORACIC WITHOUT AND WITH CONTRAST TECHNIQUE: Multiplanar and multiecho pulse sequences of the thoracic spine were obtained without and with intravenous contrast. CONTRAST:  5mL GADAVIST  GADOBUTROL  1 MMOL/ML IV SOLN COMPARISON:  MRI 10/23/2023 CT scan 11/23/2023 FINDINGS: Alignment: Normal overall alignment of the thoracic vertebral bodies. Vertebrae: Diffuse T1 and T2 signal intensity in the T6 and T7 vertebral bodies and further pathologic compression deformities of T6 and T7. The disc space is severely narrowed. There is also fluid in the disc space and diffuse abnormal contrast enhancement. There is also abnormal signal  intensity in the T8 vertebral body and marked narrowing of the T7-8 disc space. Findings suspicious for discitis and osteomyelitis at this level also. Associated epidural abscess and extensive epidural enhancing inflammatory phlegmon surrounding and compressing the thoracic spinal cord. This extends from T5-6-2 T8-9. Is also abnormal signal intensity in the T8 vertebral body and marked narrowing of the T7-8 disc space. Findings suspicious for discitis and osteomyelitis at this level also. Cord: No definite cord lesions or syrinx. No abnormal cord enhancement. Paraspinal and other soft tissues: Extensive paraspinal abscess and inflammatory phlegmon extending from T5-6 down to T8-9. Disc levels: No significant findings above the T5-6. As above there is extensive discitis and osteomyelitis at T6-7 and to a lesser extent at T7-8 with paraspinal and epidural abscesses. Degenerative disc disease with bulging discs and osteophytic ridging in the lower thoracic intervertebral disc spaces. No other sites of discitis or osteomyelitis are identified. Incidental note is made of Abner T1 and T2 signal intensity in the L2 vertebral body along with abnormal contrast enhancement. Could not exclude osteomyelitis in the L2 vertebral body. IMPRESSION: 1. Extensive progressive discitis and osteomyelitis at T6-7 and to a lesser extent at T7-8 with paraspinal and epidural abscesses. 2. Extensive epidural abscess and extensive epidural enhancing inflammatory phlegmon surrounding and compressing the thoracic spinal cord at the above levels. 3. No definite cord lesions or syrinx. No abnormal cord enhancement. 4. Incidental note is made of abnormal T1 and T2 signal intensity and abnormal enhancement in the L2 vertebral body suspicious for osteomyelitis. These results will be called to the ordering clinician or representative by the Radiologist Assistant, and communication documented in the PACS or [employer]. Electronically Signed    By: Marrian Siva M.D.   On: 11/23/2023 21:30   CT T-SPINE NO CHARGE Result Date: 11/23/2023 CLINICAL DATA:  Back pain.  No known injury. EXAM: CT THORACIC SPINE WITHOUT CONTRAST TECHNIQUE: Multidetector CT images of thoracic was performed according to the standard protocol following intravenous contrast administration. RADIATION DOSE REDUCTION: This exam was performed according to the departmental dose-optimization program which includes automated exposure control, adjustment of the mA and/or kV according to patient size and/or use of iterative reconstruction technique. CONTRAST:  75mL OMNIPAQUE  IOHEXOL  350 MG/ML SOLN COMPARISON:  Chest CT dated 11/23/2023 and thoracic spine radiograph dated 11/21/2023. Thoracic spine MRI dated 10/23/2023. FINDINGS: Alignment: No acute subluxation. Vertebrae: No acute fracture. Paraspinal and other soft tissues: There is prevertebral soft tissue thickening centered at T6-T7 with inflammatory changes extending into the posterior mediastinum and surrounding the descending thoracic aorta. No drainable fluid collection or abscess. Disc levels: There is disc space narrowing  and endplate irregularity at T6-T9. Progression of endplate irregularity and fragmentation with disc space narrowing at T6-T7. There is slight anterior wedging of T7. Findings consistent with osteomyelitis/discitis centered at T6-T7. Further evaluation with MRI without and with contrast recommended. IMPRESSION: Findings consistent with osteomyelitis/discitis centered at T6-T7. Further evaluation with MRI without and with contrast recommended. Electronically Signed   By: Angus Bark M.D.   On: 11/23/2023 19:05   CT Angio Chest PE W/Cm &/Or Wo Cm Result Date: 11/23/2023 CLINICAL DATA:  Concern for pulmonary disease. EXAM: CT ANGIOGRAPHY CHEST WITH CONTRAST TECHNIQUE: Multidetector CT imaging of the chest was performed using the standard protocol during bolus administration of intravenous contrast. Multiplanar  CT image reconstructions and MIPs were obtained to evaluate the vascular anatomy. RADIATION DOSE REDUCTION: This exam was performed according to the departmental dose-optimization program which includes automated exposure control, adjustment of the mA and/or kV according to patient size and/or use of iterative reconstruction technique. CONTRAST:  75mL OMNIPAQUE  IOHEXOL  350 MG/ML SOLN COMPARISON:  Chest radiograph dated 11/23/2023. FINDINGS: Cardiovascular: Top-normal cardiac size. No pericardial effusion. The thoracic aorta is unremarkable. The origins of the great vessels of the aortic arch appear patent as visualized. No pulmonary artery embolus identified. Mediastinum/Nodes: Mildly enlarged right hilar lymph nodes measure 11 mm short axis. The esophagus is grossly unremarkable. No mediastinal fluid collection. Lungs/Pleura: There are bibasilar linear atelectasis. No focal consolidation, pleural effusion, or pneumothorax. The central airways are patent. Upper Abdomen: No acute abnormality. Musculoskeletal: There is disc space narrowing and endplate irregularity and fragmentation predominantly at T6-T7. There is demineralization of the vertebra along the T6-T7 disc with mild anterior wedging of T7. There is progression of disc space narrowing and fragmentation at T6-T7 since the MRI of 10/23/2023. Findings most consistent with spondylo discitis. Further evaluation with MRI without and with contrast recommended. There is thickening of the prevertebral tissues at T6-T7 with inflammatory changes extending into the posterior mediastinum and surrounding the descending thoracic aorta. No drainable fluid collection or abscess noted. Review of the MIP images confirms the above findings. IMPRESSION: 1. No CT evidence of pulmonary artery embolus. 2. 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 Further evaluation with MRI without and with contrast recommended.  Electronically Signed   By: Angus Bark M.D.   On: 11/23/2023 19:01   DG Chest 2 View Result Date: 11/23/2023 CLINICAL DATA:  Chest pain, muscle spasms EXAM: CHEST - 2 VIEW COMPARISON:  10/28/2023, 10/23/2023 FINDINGS: Frontal and lateral views of the chest demonstrate a stable cardiac silhouette. Right-sided PICC tip within the superior vena cava. Linear areas of consolidation within the left mid and right lower lung zones consistent with subsegmental atelectasis. No airspace disease, effusion, or pneumothorax. There is progressive disc space narrowing within the midthoracic spine, with anterior wedging at the approximally T6 level which appears new since prior MRI. Given previous MRI findings, progressive discitis/osteomyelitis is suspected. IMPRESSION: 1. New anterior wedging within the midthoracic spine at approximately the T6 level, with progressive loss of disc space height, concerning for progressive discitis/osteomyelitis given previous MRI findings. Repeat MRI imaging may be useful. 2. Scattered areas of subsegmental atelectasis at the lung bases. Electronically Signed   By: Bobbye Burrow M.D.   On: 11/23/2023 16:32        Scheduled Meds:  baclofen   10 mg Oral Daily   Chlorhexidine  Gluconate Cloth  6 each Topical Daily   gabapentin   100 mg Oral TID   metoprolol  tartrate  100 mg Oral BID   multivitamin with minerals  1 tablet Oral Daily   sodium chloride  flush  10-40 mL Intracatheter Q12H   sodium zirconium cyclosilicate   10 g Oral Once   Continuous Infusions:  sodium chloride  75 mL/hr at 11/24/23 0328   ceFEPime  (MAXIPIME ) IV     vancomycin        LOS: 1 day      Tiajuana Fluke, MD [facility]   If 7PM-7AM, please contact night-coverage  11/24/2023, 12:12 PM

## 2023-11-24 NOTE — Progress Notes (Signed)
 Pharmacy Antibiotic Note  Danielle Harrison is a 48 y.o. female admitted on 11/23/2023 with  recent history of MRSA bacteremia, foot infection, and osteomyelitis of T-spine on daptomycin  500mg  IV q24h (end date 12/10/23), but worsening now .  Pharmacy has been consulted for Vancomycin  and Cefepime  dosing.  Today, 11/24/2023 Day #1 vancomycin  and cefepime . Daptomycin  prior to admission  Renal: SCr WNL WBC 10.9 Afebrile 2/9 MRI: progressive discitis T6-7 and epidural abscess 2/9 blood cultures: NGTD Daptomycin  MIC from previous admit = 0.5 mcg/ml  Plan: Change vancomycin  to 750mg  IV q12h Used actual SCr (0.5 mg/dl)  as this calculates a CrCl that makes sense for age eAUC 478 (goal  AUC = 400-600) Cmin 12.3 mcg/ml Vancomycin  levels from previous admit did not make sense - peak = 4, trough < 4 Continue cefepime  2gm IV q8h  Monitor renal function Check vancomycin  levels as steady-state Await cultures   Height: 5' (152.4 cm) Weight: 49.9 kg (110 lb) IBW/kg (Calculated) : 45.5  Temp (24hrs), Avg:98.5 F (36.9 C), Min:98.3 F (36.8 C), Max:98.8 F (37.1 C)  Recent Labs  Lab 11/23/23 1549 11/23/23 1725 11/23/23 2136 11/24/23 0559  WBC 13.1*  --   --  10.9*  CREATININE 0.56  --   --  0.49  LATICACIDVEN  --  3.4* 2.1*  --     Estimated Creatinine Clearance: 62.4 mL/min (by C-G formula based on SCr of 0.49 mg/dL).    Allergies  Allergen Reactions   Diclofenac Hives    Antimicrobials this admission: Cefepime  2/9 >>  Vancomycin  2/9 >>  On daptomycin  prior to admit  Thank you for allowing pharmacy to be a part of this patient's care.  Kahlyn Shippey, PharmD, BCPS, BCIDP Work Cell: [phone] 11/24/2023 12:06 PM

## 2023-11-24 NOTE — Progress Notes (Signed)
 Patient brought to [facility] and prepared for Interventional Radiology (IR) scheduled procedure.  While awaiting, notified that the IR procedure has been cancelled and a Radiology PA came and discussed with patient.  Patient returned to in-patient assigned bed space, room 133, 1A.

## 2023-11-24 NOTE — Progress Notes (Signed)
 In preparation for surgery please obtain a type and screen, have her n.p.o. at midnight, hold any anticoagulation medications.  She will likely have her procedure in the late morning/early afternoon tomorrow.  She is currently being posted for this.

## 2023-11-24 NOTE — Consult Note (Signed)
 Chief Complaint: Patient was seen in consultation today for  Chief Complaint  Patient presents with   Arm Pain   Chest Pain   Referring Physician(s): Dr. Henderson Lock   Supervising Physician: Elene Griffes  Patient Status: [facility] - In-pt  History of Present Illness: Danielle Harrison is a 48 y.o. female with a medical history significant for HTN, anxiety, RLS, pancreatitis and alcohol abuse. 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 She was discharged on daptomycin  via PICC. She presented to the ED 11/23/23 with complaints of muscle spasms and chest/back pain. Work up showed leukocytosis, lactic acid of 3.4 and progressive thoracic discitis/osteomyelitis with paraspinal and epidural abscesses.   MRI Thoracic Spine 11/23/23 IMPRESSION: 1. Extensive progressive discitis and osteomyelitis at T6-7 and to a lesser extent at T7-8 with paraspinal and epidural abscesses. 2. Extensive epidural abscess and extensive epidural enhancing inflammatory phlegmon surrounding and compressing the thoracic spinal cord at the above levels. 3. No definite cord lesions or syrinx. No abnormal cord enhancement. 4. Incidental note is made of abnormal T1 and T2 signal intensity and abnormal enhancement in the L2 vertebral body suspicious for osteomyelitis.  Interventional Radiology has been asked to evaluate patient for an image-guided thoracic paraspinal/disc aspiration. Imaging reviewed and procedure approved by Dr. Julietta Ogren.   Past Medical History:  Diagnosis Date   Anxiety    Arthritis    back   Asthma    HTN (hypertension)    Neuromuscular disorder (HCC)    weakness and numbness    Restless leg syndrome     Past Surgical History:  Procedure Laterality Date   BIOPSY  06/28/2019   Procedure: BIOPSY;  Surgeon: Irby Mannan, MD;  Location: [facility];   Service: Endoscopy;;   CESAREAN SECTION     x2   COLONOSCOPY WITH PROPOFOL  N/A 06/28/2019   Procedure: COLONOSCOPY WITH PROPOFOL ;  Surgeon: Irby Mannan, MD;  Location: [facility];  Service: Endoscopy;  Laterality: N/A;   ESOPHAGOGASTRODUODENOSCOPY (EGD) WITH PROPOFOL  N/A 06/28/2019   Procedure: ESOPHAGOGASTRODUODENOSCOPY (EGD) WITH PROPOFOL ;  Surgeon: Irby Mannan, MD;  Location: [facility];  Service: Endoscopy;  Laterality: N/A;   INCISION AND DRAINAGE Left 10/27/2023   Procedure: INCISION AND DRAINAGE LEFT FOOT;  Surgeon: Evertt Hoe, DPM;  Location: [facility];  Service: Orthopedics/Podiatry;  Laterality: Left;   INCISION AND DRAINAGE Left 10/30/2023   Procedure: LEFT FOOT FIRST METATARSAL PHALANGEAL JOINT RESECTION, ANTIBIOTIC SPACER, WASHOUT, WOUND CLOSURE;  Surgeon: Evertt Hoe, DPM;  Location: [facility];  Service: Orthopedics/Podiatry;  Laterality: Left;   TEE WITHOUT CARDIOVERSION N/A 10/29/2023   Procedure: TRANSESOPHAGEAL ECHOCARDIOGRAM (TEE);  Surgeon: Devorah Fonder, MD;  Location: [facility];  Service: Cardiovascular;  Laterality: N/A;   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   Procedure: LEFT FOOT 1ST MPJ RESECTION;  Surgeon: Evertt Hoe, DPM;  Location: [facility];  Service: Orthopedics/Podiatry;  Laterality: Left;   TRANSMETATARSAL AMPUTATION Left 10/30/2023   Procedure: LEFT FOOT 1ST MPJ RESECTION;  Surgeon: Evertt Hoe, DPM;  Location: [facility];  Service: Orthopedics/Podiatry;  Laterality: Left;    Allergies: Diclofenac  Medications: Prior to Admission medications   Medication Sig Start Date End Date Taking? Authorizing Provider  acetaminophen  (TYLENOL ) 325 MG tablet Take 2 tablets (650 mg total) by mouth every 8 (eight) hours as needed for mild pain (pain score 1-3), moderate pain (pain score 4-6), fever or  headache. 11/04/23   Althia Atlas, MD  ascorbic acid  (VITAMIN C ) 500 MG tablet Take 1  tablet (500 mg total) by mouth daily. 11/04/23 12/04/23  Althia Atlas, MD  baclofen  (LIORESAL ) 10 MG tablet Take 1 tablet (10 mg total) by mouth daily. 11/21/23 11/20/24  Scoggins, Amber, NP  bisacodyl  (DULCOLAX) 5 MG EC tablet Take 2 tablets (10 mg total) by mouth at bedtime as needed for moderate constipation. 11/04/23   Althia Atlas, MD  daptomycin  (CUBICIN ) IVPB Inject 500 mg into the vein daily. Indication:  MRSA bacteremia, thoracic discitis, toe osteomyelitis First Dose: Yes Last Day of Therapy:  12/10/2023 Labs - Once weekly:  CBC/D, CMP, CPK, CRP, ESR Fax weekly lab results  promptly to [phone] Please pull PIC at completion of IV antibiotics Call [phone] with critical value or questions Method of administration: IV Push Method of administration may be changed at the discretion of home infusion pharmacist based upon assessment of the patient and/or caregiver's ability to self-administer the medication ordered. 11/05/23 12/10/23  Althia Atlas, MD  gabapentin  (NEURONTIN ) 100 MG capsule Take 1 capsule (100 mg total) by mouth 3 (three) times daily. 11/13/23   Velma Ghazi, DPM  metFORMIN  (GLUCOPHAGE ) 500 MG tablet Take 1 tablet (500 mg total) by mouth 2 (two) times daily with a meal. 11/04/23 05/02/24  Althia Atlas, MD  metoprolol  tartrate (LOPRESSOR ) 100 MG tablet Take 1 tablet (100 mg total) by mouth 2 (two) times daily. 11/04/23 05/02/24  Althia Atlas, MD  Multiple Vitamin (MULTIVITAMIN WITH MINERALS) TABS tablet Take 1 tablet by mouth daily. 11/05/23   Althia Atlas, MD  oxyCODONE  (OXY IR/ROXICODONE ) 5 MG immediate release tablet Take 1 tablet (5 mg total) by mouth every 6 (six) hours as needed for severe pain (pain score 7-10). 11/11/23   Standiford, Karlene Overcast, DPM  polyethylene glycol powder (GLYCOLAX /MIRALAX ) 17 GM/SCOOP powder Take 17 g by mouth daily. Mix as directed. 11/04/23   Althia Atlas, MD  predniSONE  (STERAPRED UNI-PAK 21 TAB) 10 MG (21) TBPK tablet Take by mouth daily. Take  6 tabs by mouth daily  for 1 days, then 5 tabs for 1 days, then 4 tabs for 1 days, then 3 tabs for 1 days, 2 tabs for 1 days, then 1 tab by mouth daily for 1 days 11/16/23   Reena Canning, NP     Family History  Problem Relation Age of Onset   Colon cancer Neg Hx     Social History   Socioeconomic History   Marital status: Married    Spouse name: Not on file   Number of children: Not on file   Years of education: Not on file   Highest education level: Not on file  Occupational History   Not on file  Tobacco Use   Smoking status: Former    Current packs/day: 0.50    Average packs/day: 0.5 packs/day for 6.0 years (3.0 ttl pk-yrs)    Types: Cigarettes   Smokeless tobacco: Never   Tobacco comments:    Quit 8/22  Vaping Use   Vaping status: Every Day   Substances: Nicotine  Substance and Sexual Activity   Alcohol use: Not Currently    Comment: 3 times per week   Drug use: Never   Sexual activity: Not on file  Other Topics Concern   Not on file  Social History Narrative   Not on file   Social Drivers of Health   Financial Resource Strain: Not on file  Food Insecurity: No  Food Insecurity (11/24/2023)   Hunger Vital Sign    Worried About Running Out of Food in the Last Year: Never true    Ran Out of Food in the Last Year: Never true  Transportation Needs: No Transportation Needs (11/24/2023)   PRAPARE - Administrator, Civil Service (Medical): No    Lack of Transportation (Non-Medical): No  Physical Activity: Not on file  Stress: Not on file  Social Connections: Not on file    Review of Systems: A 12 point ROS discussed and pertinent positives are indicated in the HPI above.  All other systems are negative.  Review of Systems  Constitutional:  Negative for appetite change and fatigue.  Respiratory:  Negative for cough and shortness of breath.   Cardiovascular:  Positive for chest pain. Negative for leg swelling.       Chest wall pain    Gastrointestinal:  Negative for abdominal pain, diarrhea and nausea.  Genitourinary:  Positive for flank pain.  Musculoskeletal:  Positive for back pain.  Neurological:  Negative for dizziness and headaches.    Vital Signs: BP (!) 152/107 (BP Location: Left Arm)   Pulse 91   Temp 98.8 F (37.1 C)   Resp 18   Ht 5' (1.524 m)   Wt 110 lb (49.9 kg)   LMP 06/01/2022 (Exact Date)   SpO2 100%   BMI 21.48 kg/m   Physical Exam Constitutional:      General: She is not in acute distress.    Appearance: She is not ill-appearing.  HENT:     Mouth/Throat:     Mouth: Mucous membranes are moist.     Pharynx: Oropharynx is clear.  Cardiovascular:     Rate and Rhythm: Normal rate.  Pulmonary:     Effort: Pulmonary effort is normal.  Abdominal:     Tenderness: There is no abdominal tenderness.  Skin:    General: Skin is warm and dry.  Neurological:     Mental Status: She is alert and oriented to person, place, and time.     Imaging: DG Thoracic Spine 2 View Result Date: 11/23/2023 CLINICAL DATA:  Back pain. EXAM: THORACIC SPINE 2 VIEWS COMPARISON:  Thoracic spine MRI 10/2023 FINDINGS: Progressive disc space narrowing and wedging of T6 and T7 worrisome for discitis and osteomyelitis and pathologic compression fracture. Recommend MRI thoracic spine without and with contrast for further evaluation. On the frontal film there appears to be abnormal paraspinal soft tissue density worrisome for hematoma or abscess. IMPRESSION: 1. Progressive disc space narrowing and wedging of T6 and T7 worrisome for discitis and osteomyelitis and pathologic compression fracture. Recommend MRI thoracic spine without and with contrast for further evaluation. 2. Abnormal paraspinal soft tissue density worrisome for hematoma or abscess. Electronically Signed   By: Marrian Siva M.D.   On: 11/23/2023 21:32   DG Lumbar Spine Complete Result Date: 11/23/2023 CLINICAL DATA:  Back pain. EXAM: LUMBAR SPINE - COMPLETE 4+  VIEW COMPARISON:  Lumbar spine MRI 10/23/2023 FINDINGS: Stable degenerative anterolisthesis of L3 and L4. No acute bony findings or destructive bony changes. IMPRESSION: 1. Stable degenerative anterolisthesis of L3 and L4. 2. No acute bony findings. Electronically Signed   By: Marrian Siva M.D.   On: 11/23/2023 21:31   MR THORACIC SPINE W WO CONTRAST Result Date: 11/23/2023 CLINICAL DATA:  Follow-up discitis/osteomyelitis at T6-7. EXAM: MRI THORACIC WITHOUT AND WITH CONTRAST TECHNIQUE: Multiplanar and multiecho pulse sequences of the thoracic spine were obtained without and with intravenous  contrast. CONTRAST:  5mL GADAVIST  GADOBUTROL  1 MMOL/ML IV SOLN COMPARISON:  MRI 10/23/2023 CT scan 11/23/2023 FINDINGS: Alignment: Normal overall alignment of the thoracic vertebral bodies. Vertebrae: Diffuse T1 and T2 signal intensity in the T6 and T7 vertebral bodies and further pathologic compression deformities of T6 and T7. The disc space is severely narrowed. There is also fluid in the disc space and diffuse abnormal contrast enhancement. There is also abnormal signal intensity in the T8 vertebral body and marked narrowing of the T7-8 disc space. Findings suspicious for discitis and osteomyelitis at this level also. Associated epidural abscess and extensive epidural enhancing inflammatory phlegmon surrounding and compressing the thoracic spinal cord. This extends from T5-6-2 T8-9. Is also abnormal signal intensity in the T8 vertebral body and marked narrowing of the T7-8 disc space. Findings suspicious for discitis and osteomyelitis at this level also. Cord: No definite cord lesions or syrinx. No abnormal cord enhancement. Paraspinal and other soft tissues: Extensive paraspinal abscess and inflammatory phlegmon extending from T5-6 down to T8-9. Disc levels: No significant findings above the T5-6. As above there is extensive discitis and osteomyelitis at T6-7 and to a lesser extent at T7-8 with paraspinal and epidural  abscesses. Degenerative disc disease with bulging discs and osteophytic ridging in the lower thoracic intervertebral disc spaces. No other sites of discitis or osteomyelitis are identified. Incidental note is made of Abner T1 and T2 signal intensity in the L2 vertebral body along with abnormal contrast enhancement. Could not exclude osteomyelitis in the L2 vertebral body. IMPRESSION: 1. Extensive progressive discitis and osteomyelitis at T6-7 and to a lesser extent at T7-8 with paraspinal and epidural abscesses. 2. Extensive epidural abscess and extensive epidural enhancing inflammatory phlegmon surrounding and compressing the thoracic spinal cord at the above levels. 3. No definite cord lesions or syrinx. No abnormal cord enhancement. 4. Incidental note is made of abnormal T1 and T2 signal intensity and abnormal enhancement in the L2 vertebral body suspicious for osteomyelitis. These results will be called to the ordering clinician or representative by the Radiologist Assistant, and communication documented in the PACS or [employer]. Electronically Signed   By: Marrian Siva M.D.   On: 11/23/2023 21:30   CT T-SPINE NO CHARGE Result Date: 11/23/2023 CLINICAL DATA:  Back pain.  No known injury. EXAM: CT THORACIC SPINE WITHOUT CONTRAST TECHNIQUE: Multidetector CT images of thoracic was performed according to the standard protocol following intravenous contrast administration. RADIATION DOSE REDUCTION: This exam was performed according to the departmental dose-optimization program which includes automated exposure control, adjustment of the mA and/or kV according to patient size and/or use of iterative reconstruction technique. CONTRAST:  75mL OMNIPAQUE  IOHEXOL  350 MG/ML SOLN COMPARISON:  Chest CT dated 11/23/2023 and thoracic spine radiograph dated 11/21/2023. Thoracic spine MRI dated 10/23/2023. FINDINGS: Alignment: No acute subluxation. Vertebrae: No acute fracture. Paraspinal and other soft tissues: There  is prevertebral soft tissue thickening centered at T6-T7 with inflammatory changes extending into the posterior mediastinum and surrounding the descending thoracic aorta. No drainable fluid collection or abscess. Disc levels: There is disc space narrowing and endplate irregularity at T6-T9. Progression of endplate irregularity and fragmentation with disc space narrowing at T6-T7. There is slight anterior wedging of T7. Findings consistent with osteomyelitis/discitis centered at T6-T7. Further evaluation with MRI without and with contrast recommended. IMPRESSION: Findings consistent with osteomyelitis/discitis centered at T6-T7. Further evaluation with MRI without and with contrast recommended. Electronically Signed   By: Angus Bark M.D.   On: 11/23/2023 19:05   CT  Angio Chest PE W/Cm &/Or Wo Cm Result Date: 11/23/2023 CLINICAL DATA:  Concern for pulmonary disease. EXAM: CT ANGIOGRAPHY CHEST WITH CONTRAST TECHNIQUE: Multidetector CT imaging of the chest was performed using the standard protocol during bolus administration of intravenous contrast. Multiplanar CT image reconstructions and MIPs were obtained to evaluate the vascular anatomy. RADIATION DOSE REDUCTION: This exam was performed according to the departmental dose-optimization program which includes automated exposure control, adjustment of the mA and/or kV according to patient size and/or use of iterative reconstruction technique. CONTRAST:  75mL OMNIPAQUE  IOHEXOL  350 MG/ML SOLN COMPARISON:  Chest radiograph dated 11/23/2023. FINDINGS: Cardiovascular: Top-normal cardiac size. No pericardial effusion. The thoracic aorta is unremarkable. The origins of the great vessels of the aortic arch appear patent as visualized. No pulmonary artery embolus identified. Mediastinum/Nodes: Mildly enlarged right hilar lymph nodes measure 11 mm short axis. The esophagus is grossly unremarkable. No mediastinal fluid collection. Lungs/Pleura: There are bibasilar linear  atelectasis. No focal consolidation, pleural effusion, or pneumothorax. The central airways are patent. Upper Abdomen: No acute abnormality. Musculoskeletal: There is disc space narrowing and endplate irregularity and fragmentation predominantly at T6-T7. There is demineralization of the vertebra along the T6-T7 disc with mild anterior wedging of T7. There is progression of disc space narrowing and fragmentation at T6-T7 since the MRI of 10/23/2023. Findings most consistent with spondylo discitis. Further evaluation with MRI without and with contrast recommended. There is thickening of the prevertebral tissues at T6-T7 with inflammatory changes extending into the posterior mediastinum and surrounding the descending thoracic aorta. No drainable fluid collection or abscess noted. Review of the MIP images confirms the above findings. IMPRESSION: 1. No CT evidence of pulmonary artery embolus. 2. 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 Further evaluation with MRI without and with contrast recommended. Electronically Signed   By: Angus Bark M.D.   On: 11/23/2023 19:01   DG Chest 2 View Result Date: 11/23/2023 CLINICAL DATA:  Chest pain, muscle spasms EXAM: CHEST - 2 VIEW COMPARISON:  10/28/2023, 10/23/2023 FINDINGS: Frontal and lateral views of the chest demonstrate a stable cardiac silhouette. Right-sided PICC tip within the superior vena cava. Linear areas of consolidation within the left mid and right lower lung zones consistent with subsegmental atelectasis. No airspace disease, effusion, or pneumothorax. There is progressive disc space narrowing within the midthoracic spine, with anterior wedging at the approximally T6 level which appears new since prior MRI. Given previous MRI findings, progressive discitis/osteomyelitis is suspected. IMPRESSION: 1. New anterior wedging within the midthoracic spine at approximately the T6 level, with progressive  loss of disc space height, concerning for progressive discitis/osteomyelitis given previous MRI findings. Repeat MRI imaging may be useful. 2. Scattered areas of subsegmental atelectasis at the lung bases. Electronically Signed   By: Bobbye Burrow M.D.   On: 11/23/2023 16:32   DG Foot Complete Left Result Date: 11/13/2023 Please see detailed radiograph report in office note.  US  Venous Img Lower Bilateral (DVT) Result Date: 11/02/2023 CLINICAL DATA:  Lower extremity edema EXAM: BILATERAL LOWER EXTREMITY VENOUS DOPPLER ULTRASOUND TECHNIQUE: Gray-scale sonography with compression, as well as color and duplex ultrasound, were performed to evaluate the deep venous system(s) from the level of the common femoral vein through the popliteal and proximal calf veins. COMPARISON:  None Available. FINDINGS: VENOUS Normal compressibility of the common femoral, superficial femoral, and popliteal veins, as well as the visualized calf veins. Visualized portions of profunda femoral vein and great saphenous vein unremarkable. No filling  defects to suggest DVT on grayscale or color Doppler imaging. Doppler waveforms show normal direction of venous flow, normal respiratory plasticity and response to augmentation. OTHER None. Limitations: none IMPRESSION: Negative. Electronically Signed   By: Janeece Mechanic M.D.   On: 11/02/2023 18:28   US  EKG SITE RITE Result Date: 11/02/2023 If Site Rite image not attached, placement could not be confirmed due to current cardiac rhythm.  DG Foot 2 Views Left Result Date: 10/30/2023 CLINICAL DATA:  Postoperative. EXAM: LEFT FOOT - 2 VIEW COMPARISON:  Left foot radiograph dated 10/24/2023. FINDINGS: Status post resection and cement of the first MTP joint. No acute fracture or dislocation. There is soft tissue swelling of the forefoot. Overlying dressing noted. IMPRESSION: Postsurgical changes of the first MTP joint. Electronically Signed   By: Angus Bark M.D.   On: 10/30/2023 13:00    ECHO TEE Result Date: 10/29/2023    TRANSESOPHOGEAL ECHO REPORT   Patient Name:   ARIANE HOWINGTON Luckett Date of Exam: 10/29/2023 Medical Rec #:  [id number]       Height:       60.0 in Accession #:    [id number]      Weight:       125.2 lb Date of Birth:  02-16-1976       BSA:          1.530 m Patient Age:    47 years        BP:           143/97 mmHg Patient Gender: F               HR:           95 bpm. Exam Location:  [facility] Procedure: Transesophageal Echo, Cardiac Doppler, Color Doppler and Saline            Contrast Bubble Study Indications:     Bacteremia R78.81  History:         Patient has prior history of Echocardiogram examinations, most                  recent 10/24/2023. Anxiety.  Sonographer:     Broadus Canes Referring Phys:  [id number] SHERI HAMMOCK Diagnosing Phys: Belva Boyden MD PROCEDURE: After discussion of the risks and benefits of a TEE, an informed consent was obtained from the patient. TEE procedure time was 30 minutes. The transesophogeal probe was passed without difficulty through the esophogus of the patient. Local oropharyngeal anesthetic was provided with Cetacaine . Sedation performed by different physician. Image quality was excellent. The patient's vital signs; including heart rate, blood pressure, and oxygen  saturation; remained stable throughout the procedure. The patient developed no complications during the procedure.  IMPRESSIONS  1. Left ventricular ejection fraction, by estimation, is 55 to 60%. The left ventricle has normal function. The left ventricle has no regional wall motion abnormalities.  2. Right ventricular systolic function is normal. The right ventricular size is normal.  3. No left atrial/left atrial appendage thrombus was detected.  4. The mitral valve is normal in structure. No evidence of mitral valve regurgitation. No evidence of mitral stenosis.  5. The aortic valve is normal in structure. Aortic valve regurgitation is not visualized. No aortic stenosis is present.  6. The  inferior vena cava is normal in size with greater than 50% respiratory variability, suggesting right atrial pressure of 3 mmHg.  7. Agitated saline contrast bubble study was negative, with no evidence of any interatrial shunt.  8. No valve vegetation  Conclusion(s)/Recommendation(s): Normal biventricular function without evidence of hemodynamically significant valvular heart disease. FINDINGS  Left Ventricle: Left ventricular ejection fraction, by estimation, is 55 to 60%. The left ventricle has normal function. The left ventricle has no regional wall motion abnormalities. The left ventricular internal cavity size was normal in size. There is  no left ventricular hypertrophy. Right Ventricle: The right ventricular size is normal. No increase in right ventricular wall thickness. Right ventricular systolic function is normal. Left Atrium: Left atrial size was normal in size. No left atrial/left atrial appendage thrombus was detected. Right Atrium: Right atrial size was normal in size. Pericardium: There is no evidence of pericardial effusion. Mitral Valve: The mitral valve is normal in structure. No evidence of mitral valve regurgitation. No evidence of mitral valve stenosis. There is no evidence of mitral valve vegetation. Tricuspid Valve: The tricuspid valve is normal in structure. Tricuspid valve regurgitation is not demonstrated. No evidence of tricuspid stenosis. There is no evidence of tricuspid valve vegetation. Aortic Valve: The aortic valve is normal in structure. Aortic valve regurgitation is not visualized. No aortic stenosis is present. There is no evidence of aortic valve vegetation. Pulmonic Valve: The pulmonic valve was normal in structure. Pulmonic valve regurgitation is not visualized. No evidence of pulmonic stenosis. There is no evidence of pulmonic valve vegetation. Aorta: The aortic root is normal in size and structure. Venous: The inferior vena cava is normal in size with greater than 50%  respiratory variability, suggesting right atrial pressure of 3 mmHg. IAS/Shunts: No atrial level shunt detected by color flow Doppler. Agitated saline contrast was given intravenously to evaluate for intracardiac shunting. Agitated saline contrast bubble study was negative, with no evidence of any interatrial shunt. There  is no evidence of a patent foramen ovale. There is no evidence of an atrial septal defect. Belva Boyden MD Electronically signed by Belva Boyden MD Signature Date/Time: 10/29/2023/6:40:31 PM    Final    CT ABDOMEN W CONTRAST Result Date: 10/28/2023 CLINICAL DATA:  Necrotizing pancreatitis EXAM: CT ABDOMEN WITH CONTRAST TECHNIQUE: Multidetector CT imaging of the abdomen was performed using the standard protocol following bolus administration of intravenous contrast. RADIATION DOSE REDUCTION: This exam was performed according to the departmental dose-optimization program which includes automated exposure control, adjustment of the mA and/or kV according to patient size and/or use of iterative reconstruction technique. CONTRAST:  80mL OMNIPAQUE  IOHEXOL  300 MG/ML  SOLN COMPARISON:  10/22/2023 FINDINGS: Lower chest: Small left and trace right pleural effusions with bilateral lower lobe atelectasis. Hepatobiliary: Liver is within normal limits. Gallbladder is unremarkable. No intrahepatic or extrahepatic dilatation. Pancreas: New 2.1 cm low-density lesion in the pancreatic head (series 2/image 30), suggesting a pseudocyst. Improving inflammatory changes along the pancreatic tail (series 2/image 25), without convincing necrosis. Inferior to the pancreatic tail is a developing well-defined fluid collection (series 2/image 28), suggesting a pseudocyst. Spleen: Within normal limits. Adrenals/Urinary Tract: Adrenal glands are within normal limits. Kidneys are within normal limits.  No hydronephrosis. Stomach/Bowel: Stomach is within normal limits. Visualized bowel is grossly unremarkable.  Vascular/Lymphatic: No evidence of abdominal aortic aneurysm. No suspicious abdominal lymphadenopathy. Other: No abdominal ascites. Musculoskeletal: Grade 1 anterolisthesis of L4 on L5. IMPRESSION: Improving pancreatitis, without convincing necrosis, but with developing small pseudocysts as above. Small left and trace right pleural effusions with bilateral lower lobe atelectasis. Electronically Signed   By: Zadie Herter M.D.   On: 10/28/2023 23:30   DG Chest Port 1 View Result Date: 10/28/2023 CLINICAL DATA:  Shortness of breath.  EXAM: PORTABLE CHEST 1 VIEW COMPARISON:  Chest radiograph dated 10/23/2023. FINDINGS: Mild peribronchial and interstitial densities may represent reactive small airway disease or viral infection. Clinical correlation recommended no focal consolidation or pneumothorax. Trace bilateral pleural effusions may be present. The cardiac silhouette is within normal limits. No acute osseous pathology. IMPRESSION: Mild peribronchial and interstitial densities may represent reactive small airway disease or viral infection. Electronically Signed   By: Angus Bark M.D.   On: 10/28/2023 14:06    Labs:  CBC: Recent Labs    11/03/23 0640 11/04/23 0432 11/23/23 1549 11/24/23 0559  WBC 10.1 8.9 13.1* 10.9*  HGB 9.1* 9.9* 11.1* 10.0*  HCT 27.3* 30.3* 34.7* 31.1*  PLT 758* 837* 730* 680*    COAGS: Recent Labs    10/19/23 0615 11/23/23 2155  INR 1.1 1.1  APTT  --  31    BMP: Recent Labs    11/03/23 0640 11/04/23 0432 11/23/23 1549 11/24/23 0559  NA 134* 133* 133* 135  K 4.8 5.1 5.6* 4.0  CL 103 100 103 107  CO2 23 24 19* 20*  GLUCOSE 151* 122* 176* 104*  BUN 12 16 20 15   CALCIUM 8.9 9.4 10.0 9.2  CREATININE 0.47 0.64 0.56 0.49  GFRNONAA >60 >60 >60 >60    LIVER FUNCTION TESTS: Recent Labs    10/18/23 1304 10/19/23 0615 10/28/23 0008 11/23/23 1549  BILITOT 1.9* 1.4* 0.8 0.7  AST 37 27 32 25  ALT 16 13 19 23   ALKPHOS 93 79 137* 105  PROT 8.9* 7.2  6.2* 8.6*  ALBUMIN 4.9 3.7 2.4* 4.0    TUMOR MARKERS: No results for input(s): "AFPTM", "CEA", "CA199", "CHROMGRNA" in the last 8760 hours.  Assessment and Plan:  Thoracic discitis/osteomyelitis with paraspinal abscesses: Briele L. Friar, 48 year old female, is tentatively scheduled today for an image-guided thoracic paraspinal/disc aspiration.   Risks and benefits discussed with the patient including bleeding, infection, damage to adjacent structures and sepsis.  All of the patient's questions were answered, patient is agreeable to proceed. She has been NPO.   Consent signed and in chart.  Thank you for this interesting consult.  I greatly enjoyed meeting Terease L Grimmett and look forward to participating in their care.  A copy of this report was sent to the requesting provider on this date.  Electronically Signed: Jetta Morrow, AGACNP-BC 11/24/2023, 8:29 AM   I spent a total of 20 Minutes    in face to face in clinical consultation, greater than 50% of which was counseling/coordinating care for thoracic paraspinal/disc aspiration.

## 2023-11-25 ENCOUNTER — Other Ambulatory Visit: Payer: Self-pay

## 2023-11-25 ENCOUNTER — Encounter: Payer: Self-pay | Admitting: Internal Medicine

## 2023-11-25 ENCOUNTER — Inpatient Hospital Stay: Payer: Medicaid Other

## 2023-11-25 ENCOUNTER — Inpatient Hospital Stay: Payer: Medicaid Other | Admitting: Anesthesiology

## 2023-11-25 ENCOUNTER — Encounter
Admission: EM | Disposition: A | Discharge status: Home / Self Care | Payer: Self-pay | Source: Home / Self Care | Attending: Obstetrics and Gynecology

## 2023-11-25 DIAGNOSIS — M5134 Other intervertebral disc degeneration, thoracic region: Secondary | ICD-10-CM | POA: Diagnosis not present

## 2023-11-25 DIAGNOSIS — Z87891 Personal history of nicotine dependence: Secondary | ICD-10-CM | POA: Diagnosis not present

## 2023-11-25 DIAGNOSIS — G061 Intraspinal abscess and granuloma: Secondary | ICD-10-CM | POA: Diagnosis not present

## 2023-11-25 DIAGNOSIS — I10 Essential (primary) hypertension: Secondary | ICD-10-CM | POA: Diagnosis not present

## 2023-11-25 DIAGNOSIS — M4724 Other spondylosis with radiculopathy, thoracic region: Secondary | ICD-10-CM | POA: Diagnosis not present

## 2023-11-25 DIAGNOSIS — M4624 Osteomyelitis of vertebra, thoracic region: Secondary | ICD-10-CM | POA: Diagnosis not present

## 2023-11-25 DIAGNOSIS — M8448XA Pathological fracture, other site, initial encounter for fracture: Secondary | ICD-10-CM

## 2023-11-25 DIAGNOSIS — M4714 Other spondylosis with myelopathy, thoracic region: Secondary | ICD-10-CM | POA: Diagnosis not present

## 2023-11-25 DIAGNOSIS — G062 Extradural and subdural abscess, unspecified: Secondary | ICD-10-CM | POA: Diagnosis not present

## 2023-11-25 DIAGNOSIS — M4644 Discitis, unspecified, thoracic region: Secondary | ICD-10-CM | POA: Diagnosis not present

## 2023-11-25 DIAGNOSIS — Z4789 Encounter for other orthopedic aftercare: Secondary | ICD-10-CM | POA: Diagnosis not present

## 2023-11-25 DIAGNOSIS — M4854XA Collapsed vertebra, not elsewhere classified, thoracic region, initial encounter for fracture: Secondary | ICD-10-CM | POA: Diagnosis not present

## 2023-11-25 HISTORY — PX: APPLICATION OF INTRAOPERATIVE CT SCAN: SHX6668

## 2023-11-25 HISTORY — PX: THORACIC LAMINECTOMY FOR EPIDURAL ABSCESS: SHX6115

## 2023-11-25 LAB — BASIC METABOLIC PANEL
Anion gap: 9 (ref 5–15)
BUN: 15 mg/dL (ref 6–20)
CO2: 20 mmol/L — ABNORMAL LOW (ref 22–32)
Calcium: 9.2 mg/dL (ref 8.9–10.3)
Chloride: 101 mmol/L (ref 98–111)
Creatinine, Ser: 0.41 mg/dL — ABNORMAL LOW (ref 0.44–1.00)
GFR, Estimated: >60 mL/min (ref >60)
Glucose, Bld: 161 mg/dL — ABNORMAL HIGH (ref 70–99)
Potassium: 4 mmol/L (ref 3.5–5.1)
Sodium: 130 mmol/L — ABNORMAL LOW (ref 135–145)

## 2023-11-25 LAB — CBC WITH DIFFERENTIAL/PLATELET
Abs Immature Granulocytes: 0.05 10*3/uL (ref 0.00–0.07)
Basophils Absolute: 0.1 10*3/uL (ref 0.0–0.1)
Basophils Relative: 1 %
Eosinophils Absolute: 0.6 10*3/uL — ABNORMAL HIGH (ref 0.0–0.5)
Eosinophils Relative: 5 %
HCT: 31.2 % — ABNORMAL LOW (ref 36.0–46.0)
Hemoglobin: 10.1 g/dL — ABNORMAL LOW (ref 12.0–15.0)
Immature Granulocytes: 1 %
Lymphocytes Relative: 25 %
Lymphs Abs: 2.6 10*3/uL (ref 0.7–4.0)
MCH: 31 pg (ref 26.0–34.0)
MCHC: 32.4 g/dL (ref 30.0–36.0)
MCV: 95.7 fL (ref 80.0–100.0)
Monocytes Absolute: 0.9 10*3/uL (ref 0.1–1.0)
Monocytes Relative: 9 %
Neutro Abs: 6.3 10*3/uL (ref 1.7–7.7)
Neutrophils Relative %: 59 %
Platelets: 548 10*3/uL — ABNORMAL HIGH (ref 150–400)
RBC: 3.26 MIL/uL — ABNORMAL LOW (ref 3.87–5.11)
RDW: 13.6 % (ref 11.5–15.5)
WBC: 10.4 10*3/uL (ref 4.0–10.5)
nRBC: 0 % (ref 0.0–0.2)

## 2023-11-25 LAB — GLUCOSE, CAPILLARY: Glucose-Capillary: 126 mg/dL — ABNORMAL HIGH (ref 70–99)

## 2023-11-25 LAB — ABO/RH: ABO/RH(D): O POS

## 2023-11-25 LAB — PREPARE RBC (CROSSMATCH)

## 2023-11-25 LAB — CREATININE, SERUM
Creatinine, Ser: 0.56 mg/dL (ref 0.44–1.00)
GFR, Estimated: >60 mL/min (ref >60)

## 2023-11-25 SURGERY — POSTERIOR THORACIC FUSION 3 LEVEL
Anesthesia: General | Site: Spine Thoracic

## 2023-11-25 MED ORDER — FENTANYL CITRATE (PF) 100 MCG/2ML IJ SOLN
INTRAMUSCULAR | Status: AC
Start: 2023-11-25 — End: ?
  Filled 2023-11-25: qty 2

## 2023-11-25 MED ORDER — PHENYLEPHRINE HCL-NACL 20-0.9 MG/250ML-% IV SOLN
INTRAVENOUS | Status: AC
  Filled 2023-11-25: qty 250

## 2023-11-25 MED ORDER — PHENYLEPHRINE HCL-NACL 20-0.9 MG/250ML-% IV SOLN
INTRAVENOUS | Status: DC | PRN
  Administered 2023-11-25: 30 ug/min via INTRAVENOUS

## 2023-11-25 MED ORDER — LIDOCAINE HCL (CARDIAC) PF 100 MG/5ML IV SOSY
PREFILLED_SYRINGE | INTRAVENOUS | Status: DC | PRN
  Administered 2023-11-25: 60 mg via INTRAVENOUS

## 2023-11-25 MED ORDER — KETAMINE HCL 50 MG/5ML IJ SOSY
PREFILLED_SYRINGE | INTRAMUSCULAR | Status: AC
  Filled 2023-11-25: qty 5

## 2023-11-25 MED ORDER — METHOCARBAMOL 1000 MG/10ML IJ SOLN
INTRAMUSCULAR | Status: AC
  Filled 2023-11-25: qty 10

## 2023-11-25 MED ORDER — MIDAZOLAM HCL 2 MG/2ML IJ SOLN
INTRAMUSCULAR | Status: AC
  Filled 2023-11-25: qty 2

## 2023-11-25 MED ORDER — SENNA 8.6 MG PO TABS
1.0000 | ORAL_TABLET | Freq: Two times a day (BID) | ORAL | Status: DC
  Administered 2023-11-25 – 2023-11-27 (×4): 8.6 mg via ORAL
  Filled 2023-11-25 (×6): qty 1

## 2023-11-25 MED ORDER — SURGIFLO WITH THROMBIN (HEMOSTATIC MATRIX KIT) OPTIME
TOPICAL | Status: DC | PRN
  Administered 2023-11-25 (×2): 1 via TOPICAL

## 2023-11-25 MED ORDER — CEFAZOLIN SODIUM-DEXTROSE 1-4 GM/50ML-% IV SOLN
INTRAVENOUS | Status: DC | PRN
  Administered 2023-11-25: 1 g via INTRAVENOUS

## 2023-11-25 MED ORDER — PHENOL 1.4 % MT LIQD: 1.0000 | OROMUCOSAL | Status: DC | PRN

## 2023-11-25 MED ORDER — FENTANYL CITRATE (PF) 100 MCG/2ML IJ SOLN
INTRAMUSCULAR | Status: AC
  Filled 2023-11-25: qty 2

## 2023-11-25 MED ORDER — 0.9 % SODIUM CHLORIDE (POUR BTL) OPTIME
TOPICAL | Status: DC | PRN
  Administered 2023-11-25: 500 mL

## 2023-11-25 MED ORDER — MUPIROCIN 2 % EX OINT: 1.0000 | TOPICAL_OINTMENT | Freq: Two times a day (BID) | CUTANEOUS | 0 refills | Status: DC

## 2023-11-25 MED ORDER — CHLORHEXIDINE GLUCONATE 4 % EX SOLN: 1.0000 | CUTANEOUS | 1 refills | Status: DC

## 2023-11-25 MED ORDER — ONDANSETRON HCL 4 MG PO TABS: 4.0000 mg | ORAL_TABLET | Freq: Four times a day (QID) | ORAL | Status: DC | PRN

## 2023-11-25 MED ORDER — VANCOMYCIN HCL 1000 MG IV SOLR
INTRAVENOUS | Status: AC
  Filled 2023-11-25: qty 60

## 2023-11-25 MED ORDER — SUCCINYLCHOLINE CHLORIDE 200 MG/10ML IV SOSY
PREFILLED_SYRINGE | INTRAVENOUS | Status: DC | PRN
Start: 2023-11-25 — End: 2023-11-25
  Administered 2023-11-25: 80 mg via INTRAVENOUS

## 2023-11-25 MED ORDER — BUPIVACAINE-EPINEPHRINE (PF) 0.5% -1:200000 IJ SOLN
INTRAMUSCULAR | Status: AC
  Filled 2023-11-25: qty 10

## 2023-11-25 MED ORDER — MIDAZOLAM HCL 2 MG/2ML IJ SOLN
INTRAMUSCULAR | Status: DC | PRN
  Administered 2023-11-25: 2 mg via INTRAVENOUS

## 2023-11-25 MED ORDER — DEXAMETHASONE SODIUM PHOSPHATE 10 MG/ML IJ SOLN
INTRAMUSCULAR | Status: DC | PRN
  Administered 2023-11-25: 10 mg via INTRAVENOUS

## 2023-11-25 MED ORDER — MENTHOL 3 MG MT LOZG: 1.0000 | LOZENGE | OROMUCOSAL | Status: DC | PRN

## 2023-11-25 MED ORDER — ONDANSETRON HCL 4 MG/2ML IJ SOLN: 4.0000 mg | Freq: Four times a day (QID) | INTRAMUSCULAR | Status: DC | PRN

## 2023-11-25 MED ORDER — KETAMINE HCL 50 MG/5ML IJ SOSY
PREFILLED_SYRINGE | INTRAMUSCULAR | Status: DC | PRN
Start: 2023-11-25 — End: 2023-11-25
  Administered 2023-11-25: 30 mg via INTRAVENOUS
  Administered 2023-11-25: 20 mg via INTRAVENOUS

## 2023-11-25 MED ORDER — OXYCODONE HCL 5 MG PO TABS
ORAL_TABLET | ORAL | Status: AC
  Filled 2023-11-25: qty 2

## 2023-11-25 MED ORDER — PROPOFOL 1000 MG/100ML IV EMUL
INTRAVENOUS | Status: AC
  Filled 2023-11-25: qty 100

## 2023-11-25 MED ORDER — ENOXAPARIN SODIUM 40 MG/0.4ML IJ SOSY
40.0000 mg | PREFILLED_SYRINGE | INTRAMUSCULAR | Status: DC
  Administered 2023-11-26 – 2023-11-28 (×3): 40 mg via SUBCUTANEOUS
  Filled 2023-11-25 (×3): qty 0.4

## 2023-11-25 MED ORDER — POLYETHYLENE GLYCOL 3350 17 G PO PACK: 17.0000 g | PACK | Freq: Every day | ORAL | Status: DC | PRN

## 2023-11-25 MED ORDER — BUPIVACAINE LIPOSOME 1.3 % IJ SUSP
INTRAMUSCULAR | Status: AC
  Filled 2023-11-25: qty 20

## 2023-11-25 MED ORDER — PHENYLEPHRINE 80 MCG/ML (10ML) SYRINGE FOR IV PUSH (FOR BLOOD PRESSURE SUPPORT)
PREFILLED_SYRINGE | INTRAVENOUS | Status: DC | PRN
  Administered 2023-11-25: 120 ug via INTRAVENOUS

## 2023-11-25 MED ORDER — OXYCODONE HCL 5 MG PO TABS
5.0000 mg | ORAL_TABLET | ORAL | Status: DC | PRN
  Filled 2023-11-25: qty 1

## 2023-11-25 MED ORDER — HYDRALAZINE HCL 20 MG/ML IJ SOLN
5.0000 mg | Freq: Four times a day (QID) | INTRAMUSCULAR | Status: DC | PRN
  Administered 2023-11-25: 5 mg via INTRAVENOUS

## 2023-11-25 MED ORDER — SODIUM CHLORIDE 0.9 % IV SOLN: INTRAVENOUS | Status: DC | PRN

## 2023-11-25 MED ORDER — HYDROMORPHONE HCL 1 MG/ML IJ SOLN
INTRAMUSCULAR | Status: AC
  Filled 2023-11-25: qty 1

## 2023-11-25 MED ORDER — FENTANYL CITRATE (PF) 100 MCG/2ML IJ SOLN
25.0000 ug | INTRAMUSCULAR | Status: DC | PRN
Start: 2023-11-25 — End: 2023-11-25
  Administered 2023-11-25 (×2): 50 ug via INTRAVENOUS

## 2023-11-25 MED ORDER — ACETAMINOPHEN 10 MG/ML IV SOLN
INTRAVENOUS | Status: DC | PRN
  Administered 2023-11-25: 1000 mg via INTRAVENOUS

## 2023-11-25 MED ORDER — OXYCODONE HCL 5 MG PO TABS
10.0000 mg | ORAL_TABLET | ORAL | Status: DC | PRN
  Administered 2023-11-25 – 2023-11-28 (×18): 10 mg via ORAL
  Filled 2023-11-25 (×17): qty 2

## 2023-11-25 MED ORDER — ACETAMINOPHEN 10 MG/ML IV SOLN
INTRAVENOUS | Status: AC
  Filled 2023-11-25: qty 100

## 2023-11-25 MED ORDER — REMIFENTANIL HCL 1 MG IV SOLR
INTRAVENOUS | Status: AC
  Filled 2023-11-25: qty 1000

## 2023-11-25 MED ORDER — SODIUM CHLORIDE 0.9 % IV SOLN: 10.0000 mL/h | Freq: Once | INTRAVENOUS | Status: DC

## 2023-11-25 MED ORDER — PROPOFOL 10 MG/ML IV BOLUS
INTRAVENOUS | Status: DC | PRN
Start: 2023-11-25 — End: 2023-11-25
  Administered 2023-11-25: 50 mg via INTRAVENOUS
  Administered 2023-11-25: 150 mg via INTRAVENOUS
  Administered 2023-11-25: 160 ug/kg/min via INTRAVENOUS

## 2023-11-25 MED ORDER — DOCUSATE SODIUM 100 MG PO CAPS
100.0000 mg | ORAL_CAPSULE | Freq: Two times a day (BID) | ORAL | Status: DC
  Administered 2023-11-25 – 2023-11-27 (×4): 100 mg via ORAL
  Filled 2023-11-25 (×6): qty 1

## 2023-11-25 MED ORDER — PROPOFOL 10 MG/ML IV BOLUS
INTRAVENOUS | Status: AC
  Filled 2023-11-25: qty 20

## 2023-11-25 MED ORDER — METHOCARBAMOL 1000 MG/10ML IJ SOLN
500.0000 mg | Freq: Four times a day (QID) | INTRAMUSCULAR | Status: DC | PRN
  Administered 2023-11-25: 500 mg via INTRAVENOUS

## 2023-11-25 MED ORDER — HYDROMORPHONE HCL 1 MG/ML IJ SOLN
INTRAMUSCULAR | Status: DC | PRN
  Administered 2023-11-25: .5 mg via INTRAVENOUS

## 2023-11-25 MED ORDER — MORPHINE SULFATE (PF) 2 MG/ML IV SOLN: 1.0000 mg | INTRAVENOUS | Status: AC | PRN

## 2023-11-25 MED ORDER — DROPERIDOL 2.5 MG/ML IJ SOLN: 0.6250 mg | Freq: Once | INTRAMUSCULAR | Status: DC | PRN

## 2023-11-25 MED ORDER — HYDROMORPHONE HCL 1 MG/ML IJ SOLN
0.2500 mg | INTRAMUSCULAR | Status: DC | PRN
Start: 2023-11-25 — End: 2023-11-25
  Administered 2023-11-25 (×2): 0.5 mg via INTRAVENOUS

## 2023-11-25 MED ORDER — FENTANYL CITRATE (PF) 100 MCG/2ML IJ SOLN
INTRAMUSCULAR | Status: DC | PRN
  Administered 2023-11-25 (×3): 50 ug via INTRAVENOUS

## 2023-11-25 MED ORDER — BUPIVACAINE HCL 0.5 % IJ SOLN
INTRAMUSCULAR | Status: DC | PRN
  Administered 2023-11-25: 60 mL via INTRAMUSCULAR

## 2023-11-25 MED ORDER — HYDRALAZINE HCL 20 MG/ML IJ SOLN
INTRAMUSCULAR | Status: AC
  Filled 2023-11-25: qty 1

## 2023-11-25 MED ORDER — BUPIVACAINE HCL (PF) 0.5 % IJ SOLN
INTRAMUSCULAR | Status: AC
  Filled 2023-11-25: qty 30

## 2023-11-25 MED ORDER — REMIFENTANIL HCL 1 MG IV SOLR
INTRAVENOUS | Status: DC | PRN
Start: 2023-11-25 — End: 2023-11-25
  Administered 2023-11-25: .2 ug/kg/min via INTRAVENOUS

## 2023-11-25 MED ORDER — BUPIVACAINE-EPINEPHRINE (PF) 0.5% -1:200000 IJ SOLN
INTRAMUSCULAR | Status: DC | PRN
  Administered 2023-11-25: 10 mL

## 2023-11-25 MED ORDER — MAGNESIUM CITRATE PO SOLN: 1.0000 | Freq: Once | ORAL | Status: DC | PRN

## 2023-11-25 MED ORDER — METHOCARBAMOL 500 MG PO TABS
500.0000 mg | ORAL_TABLET | Freq: Four times a day (QID) | ORAL | Status: DC | PRN
  Administered 2023-11-26 (×2): 500 mg via ORAL
  Filled 2023-11-25 (×2): qty 1

## 2023-11-25 SURGICAL SUPPLY — 55 items
ALLOGRAFT BONE FIBER KORE 10CC (Bone Implant) IMPLANT
BASIN KIT SINGLE STR (MISCELLANEOUS) ×2 IMPLANT
BUR NEURO DRILL SOFT 3.0X3.8M (BURR) ×2 IMPLANT
COVERAGE SUPP BRAINLAB NG SPNE (MISCELLANEOUS) IMPLANT
COVERAGE SUPPORT SPINE BRAINLB (MISCELLANEOUS)
DERMABOND ADVANCED .7 DNX12 (GAUZE/BANDAGES/DRESSINGS) IMPLANT
DRAPE C ARM PK CFD 31 SPINE (DRAPES) ×2 IMPLANT
DRAPE C-ARM 42X70 (DRAPES) IMPLANT
DRAPE LAPAROTOMY 100X77 ABD (DRAPES) ×2 IMPLANT
DRAPE SCAN PATIENT (DRAPES) ×2 IMPLANT
ELECT REM PT RETURN 9FT ADLT (ELECTROSURGICAL) ×2 IMPLANT
ELECTRODE REM PT RTRN 9FT ADLT (ELECTROSURGICAL) ×2 IMPLANT
EVACUATOR 1/8 PVC DRAIN (DRAIN) IMPLANT
EX-PIN ORTHOLOCK NAV 4X150 (PIN) IMPLANT
FEE CVG SUPP BRAINLAB NG SPNE (MISCELLANEOUS) IMPLANT
FEE INTRAOP CADWELL SUPPLY NCS (MISCELLANEOUS) IMPLANT
FEE INTRAOP MONITOR IMPULS NCS (MISCELLANEOUS) IMPLANT
GLOVE BIOGEL PI IND STRL 7.0 (GLOVE) ×2 IMPLANT
GLOVE BIOGEL PI IND STRL 8 (GLOVE) ×4 IMPLANT
GLOVE SURG SYN 7.0 (GLOVE) ×4 IMPLANT
GLOVE SURG SYN 7.0 PF PI (GLOVE) ×4 IMPLANT
GLOVE SURG SYN 7.5 E (GLOVE) ×4 IMPLANT
GLOVE SURG SYN 7.5 PF PI (GLOVE) ×4 IMPLANT
GOWN SRG LRG LVL 4 IMPRV REINF (GOWNS) ×2 IMPLANT
GOWN SRG XL LVL 3 NONREINFORCE (GOWNS) ×2 IMPLANT
HOLDER FOLEY CATH W/STRAP (MISCELLANEOUS) IMPLANT
INTRAOP CADWELL SUPPLY FEE NCS (MISCELLANEOUS) ×2 IMPLANT
INTRAOP MONITOR FEE IMPULS NCS (MISCELLANEOUS) ×2 IMPLANT
JET LAVAGE IRRISEPT WOUND (IRRIGATION / IRRIGATOR) ×2 IMPLANT
KIT PREVENA INCISION MGT 13 (CANNISTER) IMPLANT
KIT PREVENA INCISION MGT20CM45 (CANNISTER) IMPLANT
KIT SPINAL PRONEVIEW (KITS) ×2 IMPLANT
LAVAGE JET IRRISEPT WOUND (IRRIGATION / IRRIGATOR) ×2 IMPLANT
MANIFOLD NEPTUNE II (INSTRUMENTS) ×2 IMPLANT
MARKER SKIN DUAL TIP RULER LAB (MISCELLANEOUS) ×2 IMPLANT
MARKER SPHERE PSV REFLC 13MM (MARKER) ×14 IMPLANT
NDL SAFETY ECLIPSE 18X1.5 (NEEDLE) ×2 IMPLANT
NS IRRIG 500ML POUR BTL (IV SOLUTION) ×2 IMPLANT
PACK LAMINECTOMY ARMC (PACKS) ×2 IMPLANT
PAD ARMBOARD 7.5X6 YLW CONV (MISCELLANEOUS) ×2 IMPLANT
ROD RELINE-O 5.5X300 STRT NS (Rod) IMPLANT
SCREW LOCK RELINE 5.5 TULIP (Screw) IMPLANT
SCREW PA RELINE O 5.5X30 (Screw) IMPLANT
SCREW RELINE 5.5X35 POLYAXIAL (Screw) IMPLANT
STAPLER SKIN PROX 35W (STAPLE) IMPLANT
SURGIFLO W/THROMBIN 8M KIT (HEMOSTASIS) ×2 IMPLANT
SUT ETHILON 3-0 FS-10 30 BLK (SUTURE) ×2 IMPLANT
SUT STRATA 3-0 15 PS-2 (SUTURE) ×2 IMPLANT
SUT VIC AB 0 CT1 27XCR 8 STRN (SUTURE) ×4 IMPLANT
SUT VIC AB 2-0 CT1 18 (SUTURE) ×4 IMPLANT
SUTURE EHLN 3-0 FS-10 30 BLK (SUTURE) IMPLANT
SWAB CULTURE AMIES ANAERIB BLU (MISCELLANEOUS) IMPLANT
SYR 30ML LL (SYRINGE) ×4 IMPLANT
TRAP FLUID SMOKE EVACUATOR (MISCELLANEOUS) ×2 IMPLANT
WATER STERILE IRR 1000ML POUR (IV SOLUTION) IMPLANT

## 2023-11-25 NOTE — Anesthesia Preprocedure Evaluation (Signed)
Anesthesia Evaluation  Patient identified by MRN, date of birth, ID band Patient awake    Reviewed: Allergy & Precautions, NPO status , Patient's Chart, lab work & pertinent test results  History of Anesthesia Complications Negative for: history of anesthetic complications  Airway Mallampati: III  TM Distance: <3 FB Neck ROM: full    Dental  (+) Chipped, Poor Dentition   Pulmonary neg shortness of breath, asthma , neg sleep apnea, neg COPD, neg recent URI, Patient abstained from smoking.Not current smoker, former smoker   Pulmonary exam normal breath sounds clear to auscultation       Cardiovascular Exercise Tolerance: Good METShypertension, (-) angina (-) CAD and (-) Past MI negative cardio ROS Normal cardiovascular exam(-) dysrhythmias  Rhythm:Regular Rate:Normal - Systolic murmurs TEE unremarkable    Neuro/Psych  PSYCHIATRIC DISORDERS Anxiety      Neuromuscular disease    GI/Hepatic negative GI ROS,neg GERD  ,,(+)     substance abuse  alcohol useNecrotizing pancreatitis    Endo/Other  negative endocrine ROSneg diabetes    Renal/GU negative Renal ROS  negative genitourinary   Musculoskeletal  (+) Arthritis ,    Abdominal   Peds  Hematology negative hematology ROS (+) MRSA bacteremia, 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   Anesthesia Other Findings Patient is NPO appropriate and reports no nausea or vomiting today or yesterday  Past Medical History: No date: Anxiety No date: Arthritis     Comment:  back No date: Asthma No date: Neuromuscular disorder (HCC)     Comment:  weakness and numbness  No date: Restless leg syndrome  Past Surgical History: 06/28/2019: BIOPSY     Comment:  Procedure: BIOPSY;  Surgeon: Pasty Spillers, MD;                Location: [facility];  Service: Endoscopy;; No date: CESAREAN SECTION     Comment:  x2 06/28/2019: COLONOSCOPY WITH PROPOFOL; N/A     Comment:   Procedure: COLONOSCOPY WITH PROPOFOL;  Surgeon:               Pasty Spillers, MD;  Location: [facility];              Service: Endoscopy;  Laterality: N/A; 06/28/2019: ESOPHAGOGASTRODUODENOSCOPY (EGD) WITH PROPOFOL; N/A     Comment:  Procedure: ESOPHAGOGASTRODUODENOSCOPY (EGD) WITH               PROPOFOL;  Surgeon: Pasty Spillers, MD;  Location:               [facility];  Service: Endoscopy;  Laterality:               N/A; 10/27/2023: INCISION AND DRAINAGE; Left     Comment:  Procedure: INCISION AND DRAINAGE LEFT FOOT;  Surgeon:               Pilar Plate, DPM;  Location: [facility];                Service: Orthopedics/Podiatry;  Laterality: Left; 10/27/2023: TRANSMETATARSAL AMPUTATION; Left     Comment:  Procedure: LEFT FOOT 1ST MPJ RESECTION;  Surgeon:               Pilar Plate, DPM;  Location: [facility];                Service: Orthopedics/Podiatry;  Laterality: Left;  BMI    Body Mass Index: 24.46 kg/m      Reproductive/Obstetrics negative OB ROS  Anesthesia Physical Anesthesia Plan  ASA: 3  Anesthesia Plan: General   Post-op Pain Management: Minimal or no pain anticipated and Ofirmev IV (intra-op)*   Induction: Intravenous  PONV Risk Score and Plan: 2 and Propofol infusion, TIVA, Ondansetron, Midazolam, Treatment may vary due to age or medical condition and Dexamethasone  Airway Management Planned: Oral ETT  Additional Equipment: Arterial line  Intra-op Plan:   Post-operative Plan: Extubation in OR  Informed Consent: I have reviewed the patients History and Physical, chart, labs and discussed the procedure including the risks, benefits and alternatives for the proposed anesthesia with the patient or authorized representative who has indicated his/her understanding and acceptance.     Dental advisory given  Plan Discussed with: CRNA and Surgeon  Anesthesia Plan  Comments:         Anesthesia Quick Evaluation

## 2023-11-25 NOTE — Transfer of Care (Signed)
Immediate Anesthesia Transfer of Care Note  Patient: Danielle Harrison  Procedure(s) Performed: T4-T9 Posterior Spinal Fusion. (Bilateral: Spine Thoracic) T6-T7 thoracic laminectomy, right sided transpedicular decompression and disc debridement (Spine Thoracic) APPLICATION OF INTRAOPERATIVE CT SCAN (Spine Thoracic)  Patient Location: PACU  Anesthesia Type:General  Level of Consciousness: awake and alert   Airway & Oxygen Therapy: Patient Spontanous Breathing and Patient connected to face mask oxygen  Post-op Assessment: Report given to RN and Post -op Vital signs reviewed and stable  Post vital signs: stable  Last Vitals:  Vitals Value Taken Time  BP 184/115 11/25/23 1614  Temp    Pulse 79 11/25/23 1615  Resp 29 11/25/23 1615  SpO2 100 % 11/25/23 1615  Vitals shown include unfiled device data.  Last Pain:  Vitals:   11/25/23 1104  TempSrc: Tympanic  PainSc: 8       Patients Stated Pain Goal: 4 (11/25/23 1015)  Complications: No notable events documented.

## 2023-11-25 NOTE — Anesthesia Procedure Notes (Signed)
Procedure Name: Intubation Date/Time: 11/25/2023 12:22 PM  Performed by: Katherine Basset, CRNAPre-anesthesia Checklist: Patient identified, Emergency Drugs available, Suction available and Patient being monitored Patient Re-evaluated:Patient Re-evaluated prior to induction Oxygen Delivery Method: Circle system utilized Preoxygenation: Pre-oxygenation with 100% oxygen Induction Type: IV induction Laryngoscope Size: McGrath and 3 Grade View: Grade I Tube type: Oral Tube size: 6.5 mm Number of attempts: 1 Airway Equipment and Method: Stylet and Oral airway Placement Confirmation: ETT inserted through vocal cords under direct vision, positive ETCO2 and breath sounds checked- equal and bilateral Secured at: 19 cm Tube secured with: Tape Dental Injury: Teeth and Oropharynx as per pre-operative assessment

## 2023-11-25 NOTE — Progress Notes (Signed)
PROGRESS NOTE    Danielle Harrison  ZOX:[id number] DOB: Feb 21, 1976 DOA: 11/23/2023 PCP: [name], NP    Brief Narrative:   48 y.o. 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 Pt was discharged on daptomycin given through PICC line in right arm. Pt states that her left great toe pain has significantly improved.  No more abdominal pain, nausea vomiting or diarrhea.     She continues to have muscle spasm and increasing pain across her whole front chest. Also has middle back pain.  Her chest pain is sharp, severe, pleuritic, aggravated by deep breath.  Associated with shortness of breath.  No cough, fever or chills.  No loss control of bladder or bowel movement.  Denies symptoms of UTI.  Patient states that she did not drink alcohol since last admission.  Patient denies drug use.   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  No lower extremity numbness or weakness. Consulted Dr. Madaline Brilliant of neurosurgery, he recommended to continue antibiotics and  get ID consult.  Since no alarming symptoms now, patient can be observed safely on antibiotics.  He recommended to repeat MRI in 48 hours.  Patient has WBC 13.1, lactic acid 3.4, but no fever.  Clinically does not seem to have sepsis.  Plan: Discussed with neurosurgery, ID, interventional radiology.  Antibiotics (vancomycin) resumed.  N.p.o. from neurosurgical intervention today.  Neurosurgery planning for thoracic decompression, transpedicular decompression, disc debridement with anterior and posterior lateral fusion and posterior instrumentation.  Asthma: Stable -As needed Mucinex, bronchodilators    HTN (hypertension) -IV hydralazine as needed -Metoprolol   Hyperkalemia: Potassium 5.6 Received 10 g load, with improvement in serum potassium No further Lokelma.  Monitor serum potassium.   Recent septic arthritis of left foot [facility]): s/p of washout.  Surgical site is healing well -On IV vancomycin as above   DVT prophylaxis: SCD Code Status: Full Family Communication:None Disposition Plan: Status is: Inpatient Remains inpatient appropriate because: Spinal osteomyelitis   Level of care: Telemetry Medical  Consultants:  Neurosurgery IR ID  Procedures:  Spine surgery 2/11  Antimicrobials: Vancomycin   Subjective: Seen and examined.  Sitting up in bed.  Improved pain control Objective: Vitals:   11/24/23 1312 11/24/23 1612 11/24/23 2103 11/24/23 2327  BP: (!) 132/98 (!) 158/111 (!) 144/81 (!) 129/90  Pulse: 84 88 94 81  Resp: (!) 23 18  18   Temp: 99 F (37.2 C) 98.3 F (36.8 C)  98.6 F (37 C)  TempSrc: Oral     SpO2: 100% 100%  100%  Weight:      Height:        Intake/Output Summary (Last  24 hours) at 11/25/2023 1045 Last data filed at 11/25/2023 0400 Gross per 24 hour  Intake 150 ml  Output --  Net 150 ml   Filed Weights   11/23/23 1544  Weight: 49.9 kg    Examination:  General exam: No acute distress Respiratory system: Lungs clear.  Normal  breathing.  Room air Cardiovascular system: S1-S2, RRR, no murmurs, no pedal edema Gastrointestinal system: Soft, NT/ND, normal bowel sounds Central nervous system: Alert and oriented. No focal neurological deficits. Extremities: Decreased power bilateral lower extremities.  Decreased ROM.  Decreased sensation.  Gait not assessed Skin: No rashes, lesions or ulcers Psychiatry: Judgement and insight appear normal. Mood & affect appropriate.     Data Reviewed: I have personally reviewed following labs and imaging studies  CBC: Recent Labs  Lab 11/23/23 1549 11/24/23 0559 11/25/23 0806  WBC 13.1* 10.9* 10.4  NEUTROABS  --   --  6.3  HGB 11.1* 10.0* 10.1*  HCT 34.7* 31.1* 31.2*  MCV 95.6 95.4 95.7  PLT 730* 680* 548*   Basic Metabolic Panel: Recent Labs  Lab 11/23/23 1549 11/24/23 0559 11/25/23 0539 11/25/23 0806  NA 133* 135  --  130*  K 5.6* 4.0  --  4.0  CL 103 107  --  101  CO2 19* 20*  --  20*  GLUCOSE 176* 104*  --  161*  BUN 20 15  --  15  CREATININE 0.56 0.49 0.56 0.41*  CALCIUM 10.0 9.2  --  9.2   GFR: Estimated Creatinine Clearance: 62.4 mL/min (A) (by C-G formula based on SCr of 0.41 mg/dL (L)). Liver Function Tests: Recent Labs  Lab 11/23/23 1549  AST 25  ALT 23  ALKPHOS 105  BILITOT 0.7  PROT 8.6*  ALBUMIN 4.0   Recent Labs  Lab 11/23/23 1549  LIPASE 21   No results for input(s): "AMMONIA" in the last 168 hours. Coagulation Profile: Recent Labs  Lab 11/23/23 2155  INR 1.1   Cardiac Enzymes: No results for input(s): "CKTOTAL", "CKMB", "CKMBINDEX", "TROPONINI" in the last 168 hours. BNP (last 3 results) No results for input(s): "PROBNP" in the last 8760 hours. HbA1C: No results for input(s): "HGBA1C" in the last 72 hours. CBG: No results for input(s): "GLUCAP" in the last 168 hours. Lipid Profile: No results for input(s): "CHOL", "HDL", "LDLCALC", "TRIG", "CHOLHDL", "LDLDIRECT" in the last 72 hours. Thyroid Function Tests: No results  for input(s): "TSH", "T4TOTAL", "FREET4", "T3FREE", "THYROIDAB" in the last 72 hours. Anemia Panel: No results for input(s): "VITAMINB12", "FOLATE", "FERRITIN", "TIBC", "IRON", "RETICCTPCT" in the last 72 hours. Sepsis Labs: Recent Labs  Lab 11/23/23 1549 11/23/23 1725 11/23/23 2136  PROCALCITON <0.10  --   --   LATICACIDVEN  --  3.4* 2.1*    Recent Results (from the past 240 hours)  Culture, blood (routine x 2)     Status: None (Preliminary result)   Collection Time: 11/23/23  5:29 PM   Specimen: BLOOD  Result Value Ref Range Status   Specimen Description BLOOD BLOOD LEFT FOREARM  Final   Special Requests   Final    BOTTLES DRAWN AEROBIC AND ANAEROBIC Blood Culture results may not be optimal due to an excessive volume of blood received in culture bottles   Culture   Final    NO GROWTH 2 DAYS Performed at [facility], [address], [place], [place] [address]    Report Status PENDING  Incomplete  Culture, blood (routine x 2)     Status: None (  Preliminary result)   Collection Time: 11/23/23  5:29 PM   Specimen: BLOOD  Result Value Ref Range Status   Specimen Description BLOOD BLOOD RIGHT ARM  Final   Special Requests   Final    BOTTLES DRAWN AEROBIC AND ANAEROBIC Blood Culture results may not be optimal due to an inadequate volume of blood received in culture bottles   Culture   Final    NO GROWTH 2 DAYS Performed at [facility], [address], [place], [place] [address]    Report Status PENDING  Incomplete         Radiology Studies: MR THORACIC SPINE W WO CONTRAST Result Date: 11/23/2023 CLINICAL DATA:  Follow-up discitis/osteomyelitis at T6-7. EXAM: MRI THORACIC WITHOUT AND WITH CONTRAST TECHNIQUE: Multiplanar and multiecho pulse sequences of the thoracic spine were obtained without and with intravenous contrast. CONTRAST:  5mL GADAVIST GADOBUTROL 1 MMOL/ML IV SOLN COMPARISON:  MRI 10/23/2023 CT scan 11/23/2023 FINDINGS: Alignment: Normal overall  alignment of the thoracic vertebral bodies. Vertebrae: Diffuse T1 and T2 signal intensity in the T6 and T7 vertebral bodies and further pathologic compression deformities of T6 and T7. The disc space is severely narrowed. There is also fluid in the disc space and diffuse abnormal contrast enhancement. There is also abnormal signal intensity in the T8 vertebral body and marked narrowing of the T7-8 disc space. Findings suspicious for discitis and osteomyelitis at this level also. Associated epidural abscess and extensive epidural enhancing inflammatory phlegmon surrounding and compressing the thoracic spinal cord. This extends from T5-6-2 T8-9. Is also abnormal signal intensity in the T8 vertebral body and marked narrowing of the T7-8 disc space. Findings suspicious for discitis and osteomyelitis at this level also. Cord: No definite cord lesions or syrinx. No abnormal cord enhancement. Paraspinal and other soft tissues: Extensive paraspinal abscess and inflammatory phlegmon extending from T5-6 down to T8-9. Disc levels: No significant findings above the T5-6. As above there is extensive discitis and osteomyelitis at T6-7 and to a lesser extent at T7-8 with paraspinal and epidural abscesses. Degenerative disc disease with bulging discs and osteophytic ridging in the lower thoracic intervertebral disc spaces. No other sites of discitis or osteomyelitis are identified. Incidental note is made of Abner T1 and T2 signal intensity in the L2 vertebral body along with abnormal contrast enhancement. Could not exclude osteomyelitis in the L2 vertebral body. IMPRESSION: 1. Extensive progressive discitis and osteomyelitis at T6-7 and to a lesser extent at T7-8 with paraspinal and epidural abscesses. 2. Extensive epidural abscess and extensive epidural enhancing inflammatory phlegmon surrounding and compressing the thoracic spinal cord at the above levels. 3. No definite cord lesions or syrinx. No abnormal cord enhancement. 4.  Incidental note is made of abnormal T1 and T2 signal intensity and abnormal enhancement in the L2 vertebral body suspicious for osteomyelitis. These results will be called to the ordering clinician or representative by the Radiologist Assistant, and communication documented in the PACS or [employer]. Electronically Signed   By: Rudie Meyer M.D.   On: 11/23/2023 21:30   CT T-SPINE NO CHARGE Result Date: 11/23/2023 CLINICAL DATA:  Back pain.  No known injury. EXAM: CT THORACIC SPINE WITHOUT CONTRAST TECHNIQUE: Multidetector CT images of thoracic was performed according to the standard protocol following intravenous contrast administration. RADIATION DOSE REDUCTION: This exam was performed according to the departmental dose-optimization program which includes automated exposure control, adjustment of the mA and/or kV according to patient size and/or use of iterative reconstruction technique. CONTRAST:  75mL OMNIPAQUE  IOHEXOL 350 MG/ML SOLN COMPARISON:  Chest CT dated 11/23/2023 and thoracic spine radiograph dated 11/21/2023. Thoracic spine MRI dated 10/23/2023. FINDINGS: Alignment: No acute subluxation. Vertebrae: No acute fracture. Paraspinal and other soft tissues: There is prevertebral soft tissue thickening centered at T6-T7 with inflammatory changes extending into the posterior mediastinum and surrounding the descending thoracic aorta. No drainable fluid collection or abscess. Disc levels: There is disc space narrowing and endplate irregularity at T6-T9. Progression of endplate irregularity and fragmentation with disc space narrowing at T6-T7. There is slight anterior wedging of T7. Findings consistent with osteomyelitis/discitis centered at T6-T7. Further evaluation with MRI without and with contrast recommended. IMPRESSION: Findings consistent with osteomyelitis/discitis centered at T6-T7. Further evaluation with MRI without and with contrast recommended. Electronically Signed   By: Elgie Collard  M.D.   On: 11/23/2023 19:05   CT Angio Chest PE W/Cm &/Or Wo Cm Result Date: 11/23/2023 CLINICAL DATA:  Concern for pulmonary disease. EXAM: CT ANGIOGRAPHY CHEST WITH CONTRAST TECHNIQUE: Multidetector CT imaging of the chest was performed using the standard protocol during bolus administration of intravenous contrast. Multiplanar CT image reconstructions and MIPs were obtained to evaluate the vascular anatomy. RADIATION DOSE REDUCTION: This exam was performed according to the departmental dose-optimization program which includes automated exposure control, adjustment of the mA and/or kV according to patient size and/or use of iterative reconstruction technique. CONTRAST:  75mL OMNIPAQUE IOHEXOL 350 MG/ML SOLN COMPARISON:  Chest radiograph dated 11/23/2023. FINDINGS: Cardiovascular: Top-normal cardiac size. No pericardial effusion. The thoracic aorta is unremarkable. The origins of the great vessels of the aortic arch appear patent as visualized. No pulmonary artery embolus identified. Mediastinum/Nodes: Mildly enlarged right hilar lymph nodes measure 11 mm short axis. The esophagus is grossly unremarkable. No mediastinal fluid collection. Lungs/Pleura: There are bibasilar linear atelectasis. No focal consolidation, pleural effusion, or pneumothorax. The central airways are patent. Upper Abdomen: No acute abnormality. Musculoskeletal: There is disc space narrowing and endplate irregularity and fragmentation predominantly at T6-T7. There is demineralization of the vertebra along the T6-T7 disc with mild anterior wedging of T7. There is progression of disc space narrowing and fragmentation at T6-T7 since the MRI of 10/23/2023. Findings most consistent with spondylo discitis. Further evaluation with MRI without and with contrast recommended. There is thickening of the prevertebral tissues at T6-T7 with inflammatory changes extending into the posterior mediastinum and surrounding the descending thoracic aorta. No  drainable fluid collection or abscess noted. Review of the MIP images confirms the above findings. IMPRESSION: 1. No CT evidence of pulmonary artery embolus. 2. 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 Further evaluation with MRI without and with contrast recommended. Electronically Signed   By: Elgie Collard M.D.   On: 11/23/2023 19:01   DG Chest 2 View Result Date: 11/23/2023 CLINICAL DATA:  Chest pain, muscle spasms EXAM: CHEST - 2 VIEW COMPARISON:  10/28/2023, 10/23/2023 FINDINGS: Frontal and lateral views of the chest demonstrate a stable cardiac silhouette. Right-sided PICC tip within the superior vena cava. Linear areas of consolidation within the left mid and right lower lung zones consistent with subsegmental atelectasis. No airspace disease, effusion, or pneumothorax. There is progressive disc space narrowing within the midthoracic spine, with anterior wedging at the approximally T6 level which appears new since prior MRI. Given previous MRI findings, progressive discitis/osteomyelitis is suspected. IMPRESSION: 1. New anterior wedging within the midthoracic spine at approximately the T6 level, with progressive loss of disc space height, concerning for progressive discitis/osteomyelitis given  previous MRI findings. Repeat MRI imaging may be useful. 2. Scattered areas of subsegmental atelectasis at the lung bases. Electronically Signed   By: Sharlet Salina M.D.   On: 11/23/2023 16:32        Scheduled Meds:  baclofen  10 mg Oral Daily   Chlorhexidine Gluconate Cloth  6 each Topical Daily   gabapentin  100 mg Oral TID   metoprolol tartrate  100 mg Oral BID   multivitamin with minerals  1 tablet Oral Daily   sodium chloride flush  10-40 mL Intracatheter Q12H   sodium zirconium cyclosilicate  10 g Oral Once   Continuous Infusions:  vancomycin 750 mg (11/25/23 0529)     LOS: 2 days      Tresa Moore, MD [facility]   If 7PM-7AM, please contact night-coverage  11/25/2023, 10:45 AM

## 2023-11-25 NOTE — Progress Notes (Signed)
Date of Admission:  11/23/2023    ID: Danielle Harrison is a 48 y.o. female  Principal Problem:   Osteomyelitis of thoracic spine (HCC) Active Problems:   Asthma   Septic arthritis of left foot (HCC)   HTN (hypertension)   Hyperkalemia   Epidural abscess   Acute osteomyelitis of thoracic spine (HCC)   Pathologic fracture of thoracic vertebrae, initial encounter    Subjective: Pt is back from surgery Sitting in bed No distress  Medications:   baclofen  10 mg Oral Daily   Chlorhexidine Gluconate Cloth  6 each Topical Daily   docusate sodium  100 mg Oral BID   [START ON 11/26/2023] enoxaparin (LOVENOX) injection  40 mg Subcutaneous Q24H   gabapentin  100 mg Oral TID   metoprolol tartrate  100 mg Oral BID   multivitamin with minerals  1 tablet Oral Daily   senna  1 tablet Oral BID   sodium chloride flush  10-40 mL Intracatheter Q12H   sodium zirconium cyclosilicate  10 g Oral Once    Objective: Vital signs in last 24 hours: Patient Vitals for the past 24 hrs:  BP Temp Temp src Pulse Resp SpO2 Height Weight  11/25/23 2139 (!) 166/103 98.9 F (37.2 C) Oral (!) 103 18 100 % -- --  11/25/23 1745 (!) 156/94 98.3 F (36.8 C) Oral 95 16 100 % -- --  11/25/23 1726 134/88 -- -- 94 18 100 % -- --  11/25/23 1700 (!) 162/103 (!) 97.2 F (36.2 C) -- 84 19 100 % -- --  11/25/23 1646 (!) 197/113 -- -- 78 19 100 % -- --  11/25/23 1645 (!) 197/113 -- -- 80 19 100 % -- --  11/25/23 1630 (!) 191/110 -- -- 78 (!) 21 100 % -- --  11/25/23 1615 (!) 174/110 97.6 F (36.4 C) -- 78 20 100 % -- --  11/25/23 1104 (!) 136/94 98.9 F (37.2 C) Tympanic 82 18 100 % 5' (1.524 m) 49.9 kg     LDA Rt PICc  PHYSICAL EXAM:  General: Alert, cooperative, no distress, appears stated age.  Back: surgical dressing over thoracic spine- drain present Lungs: b/l air entry Heart: s1s2 Abdomen: Soft, non-tender,not distended. Bowel sounds normal. No masses Extremities: atraumatic, no cyanosis. No edema. No  clubbing Skin: No rashes or lesions. Or bruising Lymph: Cervical, supraclavicular normal. Neurologic: Grossly non-focal  Lab Results    Latest Ref Rng & Units 11/25/2023    8:06 AM 11/24/2023    5:59 AM 11/23/2023    3:49 PM  CBC  WBC 4.0 - 10.5 K/uL 10.4  10.9  13.1   Hemoglobin 12.0 - 15.0 g/dL 16.1  09.6  04.5   Hematocrit 36.0 - 46.0 % 31.2  31.1  34.7   Platelets 150 - 400 K/uL 548  680  730        Latest Ref Rng & Units 11/25/2023    8:06 AM 11/25/2023    5:39 AM 11/24/2023    5:59 AM  CMP  Glucose 70 - 99 mg/dL 409   811   BUN 6 - 20 mg/dL 15   15   Creatinine 9.14 - 1.00 mg/dL 7.82  9.56  2.13   Sodium 135 - 145 mmol/L 130   135   Potassium 3.5 - 5.1 mmol/L 4.0   4.0   Chloride 98 - 111 mmol/L 101   107   CO2 22 - 32 mmol/L 20   20   Calcium  8.9 - 10.3 mg/dL 9.2   9.2       Microbiology:  Studies/Results: DG Thoracic Spine 2 View Result Date: 11/25/2023 CLINICAL DATA:  Elective surgery. EXAM: THORACIC SPINE 2 VIEWS COMPARISON:  Preoperative MRI. FINDINGS: Seven fluoroscopic spot views of the thoracic spine submitted from the operating room. Surgical instruments localize over the midthoracic spine with multilevel pedicle screw fixation. Fluoroscopy time 1 minutes. Dose 54.52 mGy. IMPRESSION: Procedural fluoroscopy during thoracic spine surgery. Electronically Signed   By: Narda Rutherford M.D.   On: 11/25/2023 16:08   DG C-Arm 1-60 Min-No Report Result Date: 11/25/2023 Fluoroscopy was utilized by the requesting physician.  No radiographic interpretation.   DG C-Arm 1-60 Min-No Report Result Date: 11/25/2023 Fluoroscopy was utilized by the requesting physician.  No radiographic interpretation.   DG C-Arm 1-60 Min-No Report Result Date: 11/25/2023 Fluoroscopy was utilized by the requesting physician.  No radiographic interpretation.   DG C-Arm 1-60 Min-No Report Result Date: 11/25/2023 Fluoroscopy was utilized by the requesting physician.  No radiographic  interpretation.     Assessment/Plan: MRSA bacteremia in Jan 2025 with T6-T7 discitis  and left great toe septic arthritis and osteo She has been on appropriate antibiotic since last admission and it is now 23 days of Iv daptomycin and there is progression of the vertebral osteomyelitis and now with epidural abscess and nerve compression causing severe pain. She underwent surgery today  1. Posterior Segmental Instrumentation T4 to T9 2. Posterolateral arthrodesis from T4 to T9 3. Thoracic lamina to me at T6 and T7 4.  Right sided transpedicular disc debridement and washout of ventral epidural abscess 5.  Autograft harvest spinous processes 6.  Allograft placement  Culture sent Pt will need 6- 12 weeks of antibiotic post surgery depending on culture results  Pt has been started on vancomycin- dose aggressively to achieve therapeutic levels quickly Pt is going to need rifampin  along with vanco  Sed rate and crp have improved from last month Anemia   Thrombocytosis due to infection   Left great toe infeciton with septic arthritis and osteo of the Met- s/p resection- site has healed well Sutures will have to be removed   H/o pancreatitis- had small pseudocysts in CT from 1/14 She is now totally asymptomatic May repeat CT to look at the size of the pseudocyst?/  Discussed the management with patient

## 2023-11-25 NOTE — Plan of Care (Signed)

## 2023-11-25 NOTE — Op Note (Signed)
Indications: [name] is a 48 y.o. 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   Preoperative Note:   Risks of surgery discussed include: infection, bleeding, stroke, coma, death, paralysis, CSF leak, nerve/spinal cord injury, numbness, tingling, weakness, complex regional pain syndrome, recurrent stenosis and/or disc herniation, vascular injury, development of instability, neck/back pain, need for further surgery, persistent symptoms, development of deformity, and the risks of anesthesia. The patient understood these risks and agreed to proceed.  Operative Note:   OPERATIVE PROCEDURE:  1. Posterior Segmental Instrumentation T4 to T9 2. Posterolateral arthrodesis from T4 to T9 3. Thoracic lamina to me at T6 and T7 4.  Right sided transpedicular disc debridement and washout of ventral epidural abscess 5.  Autograft harvest spinous processes 6.  Allograft placement 7.  Intraoperative navigation  OPERATIVE PROCEDURE:  After induction of general anesthesia, the patient was placed in the prone position on the operative table.  A midline incision was then planned using fluoroscopy.  A timeout was performed, and antibiotics given.  Next, a midline incision was planned.  The patient was prepped and draped in the usual fashion.  All the pressure points were padded.  After a comprehensive timeout verifying the  patient's name, MRN, planned procedure, and images local anesthetic was injected into the midline incision.  After giving this time to side and a midline incision was made.  It was opened sharply.  Was brought down to the level of the thoracic fascia.  Then a subperiosteal dissection was used from T4 to T9in order to expose the posterior spinal elements.  At each level we identified the spinous process brought the dissection down to the level of the lamina and expose the medial aspect of the transverse process.  At the most cranial level we took care not to violate the facet capsule.  After satisfactory exposure has been obtained our attention turned towards instrumentation.  At this point we fixated the navigation reference frame to the most inferior spinous process.  We fixed this into place and ensure that it was in good standing.  We then covered the patient in a sterile fashion and prepared for the three-dimensional C arm image acquisition.  We performed an image acquisition and afterwards checked the reference.  The reference was well registered based off of multiple points and therefore we chose not to obtain a new image.  Once imaging was adequate we then turned our attention towards placing pedicle screws.  First using a navigated drill guide we planned the for screw at the most caudal level.  On the right side we started at T4, we found a ideal starting point with a good trajectory down the pedicle.  Based off of the intraoperative imaging we sized the pedicle screw appropriately.  We then utilized the navigated drill guide to place a pilot hole.  After the pilot hole was placed we then palpated this with a hole probe.  Given the softness of the bone, we went straight to screw placement without tapping.  Utilizing a navigated screw we  placed it into ideal position.  There was good purchase.  We then repeated this process at T5, T6, T7 was cannulated but the screw was not placed as we are planning a  transpedicular decompression, T8, and T9.  We then turned our attention to the contralateral side.  Using the same technique we placed pedicle screws at each level from T4-T9 left.  Once her instrumentation was in place we performed another 3D spin to verify the placement of the screws.  Each of them were in a safe positioning and then we went forward with focusing on our decompressive aspect of the procedure.   At T6 and T7 we performed a laminectomy with the high-speed drill.  We brought this out to the lateral aspect of the ligamentum flavum.  Once both levels were free of bone in the laminar location, the ligamentum flavum was resected with Kerrison rongeur.  Upon opening this layer we encountered a significant amount of very adherent epidural phlegmon.  The bone and soft tissue was decompressed over this and it was explored for any areas of liquid pockets.  We found 1-2 liquid pockets in the posterior location.  We then found 1 at the most caudal location as well.  We brought the ultrasound and and it demonstrated some ventral ongoing compression but it had improved with just the posterior decompression.  Because of the ventral compression we proceeded for the transpedicular decompression.  We performed a pedicle resection on the right side at T7 and followed this down to the T6-7 disc space.  Once we are in this location we were able to enter the disc space from the lateral approach and were able to debride the disc removing a significant amount of abscess and infected looking materials.  This was also sent for culture.  Once this was adequately debrided we then palpated for any further epidural phlegmon or abscesses.  We are able to encounter 1-2 pockets in the ventral space as well which were irrigated and evacuated.  We then obtained hemostasis.  At this time the decompression was done we brought in the ultrasound to verify that we had good circumferential decompression of the spinal cord which we  did.  At this point we fashioned rods for the pedicle screws.  We put these into place with set caps and final tightened them.  Utilizing high-speed drill we decorticated the posterior lateral elements from T4-T9 bilaterally with the exception of the removed posterior elements at the T6-7 level from the transpedicular decompression.  We were able to leave some lateral bone at this location to help aid with arthrodesis.Then we placed a mixture of bone chips harvested from the spinous processes alone as we did not want to take laminar bone given its proximity to the infection and DBX putty at each decorticated level to complete the arthrodesis.  We then obtained meticulous hemostasis.  We did a significant copious amount of irrigation.  We then placed a subfascial drain.  We closed in multiple layers.  Staples were utilized.  The wound drain was secured into place with a nylon a closing suture was placed as well and left for the drain pull..  The Chester Holstein was utilized over top of the wound after it had been cleaned.  The counts were correct, no immediate complications.  I performed the procedure without an Designer, television/film set.  Lovenia Kim, MD   Implant Name Type Inv. Item Serial No. Manufacturer Lot No. LRB No. Used Action  ALLOGRAFT BONE FIBER KORE  10CC - [id number] Bone Implant ALLOGRAFT BONE FIBER KORE 10CC [id number] MUSCULOSKELETL TRANSPLANT FNDN NA  1 Implanted  5.5 x 30 Reline Screw    [employer]   5 Implanted  SCREW RELINE 5.5X35 POLYAXIAL - [id number] Screw SCREW RELINE 5.5X35 POLYAXIAL  [employer]   6 Implanted  ROD RELINE-O 5.5X300 STRT NS - SNA Rod ROD RELINE-O 5.5X300 STRT NS NA [employer] NA  1 Implanted  SCREW LOCK RELINE 5.5 [place] - SNA Screw SCREW LOCK RELINE 5.5 TULIP NA [employer] NA  11 Implanted

## 2023-11-25 NOTE — Interval H&P Note (Signed)
History and Physical Interval Note:  11/25/2023 11:50 AM  Danielle Harrison  has presented today for surgery, with the diagnosis of Thoracic osteomyelitis, discitis, epidural abscess.  The various methods of treatment have been discussed with the patient and family. After consideration of risks, benefits and other options for treatment, the patient has consented to  Procedure(s): T4-T9 Posterior Spinal Fusion. (Bilateral) T6-T7 thoracic laminectomy, right sided transpedicular decompression and disc debridement (N/A) APPLICATION OF INTRAOPERATIVE CT SCAN (N/A) as a surgical intervention.  The patient's history has been reviewed, patient examined, no change in status, stable for surgery.  I have reviewed the patient's chart and labs.  Questions were answered to the patient's satisfaction.    Heart and lungs clear   Lovenia Kim

## 2023-11-25 NOTE — Anesthesia Procedure Notes (Signed)
Arterial Line Insertion Start/End2/08/2024 12:45 PM, 11/25/2023 12:47 PM Performed by: Stephanie Coup, MD, anesthesiologist  Patient location: Pre-op. Preanesthetic checklist: patient identified, IV checked, site marked, risks and benefits discussed, surgical consent, monitors and equipment checked, pre-op evaluation, timeout performed and anesthesia consent Lidocaine 1% used for infiltration brachial was placed Catheter size: 20 G Hand hygiene performed  and maximum sterile barriers used   Attempts: 1 Procedure performed using ultrasound guided technique. Following insertion, dressing applied. Post procedure assessment: normal and unchanged  Patient tolerated the procedure well with no immediate complications.

## 2023-11-26 ENCOUNTER — Telehealth: Payer: Self-pay | Admitting: Pharmacist

## 2023-11-26 DIAGNOSIS — M4624 Osteomyelitis of vertebra, thoracic region: Secondary | ICD-10-CM | POA: Diagnosis not present

## 2023-11-26 DIAGNOSIS — G062 Extradural and subdural abscess, unspecified: Secondary | ICD-10-CM | POA: Diagnosis not present

## 2023-11-26 LAB — TYPE AND SCREEN
ABO/RH(D): O POS
Antibody Screen: NEGATIVE
Unit division: 0
Unit division: 0

## 2023-11-26 LAB — BPAM RBC
Blood Product Expiration Date: 202503122359
Blood Product Expiration Date: 202503122359
ISSUE DATE / TIME: 202502111510
ISSUE DATE / TIME: 202502111510
Unit Type and Rh: 5100
Unit Type and Rh: 5100

## 2023-11-26 LAB — VANCOMYCIN, PEAK: Vancomycin Pk: 16 ug/mL — ABNORMAL LOW (ref 30–40)

## 2023-11-26 MED ORDER — AMLODIPINE BESYLATE 10 MG PO TABS
10.0000 mg | ORAL_TABLET | Freq: Every day | ORAL | Status: DC
  Administered 2023-11-26 – 2023-11-28 (×3): 10 mg via ORAL
  Filled 2023-11-26 (×3): qty 1

## 2023-11-26 MED ORDER — METHOCARBAMOL 1000 MG/10ML IJ SOLN
500.0000 mg | Freq: Four times a day (QID) | INTRAMUSCULAR | Status: DC
  Filled 2023-11-26: qty 10

## 2023-11-26 MED ORDER — SODIUM CHLORIDE 0.9% FLUSH
3.0000 mL | Freq: Two times a day (BID) | INTRAVENOUS | Status: DC
  Administered 2023-11-26 – 2023-11-28 (×5): 3 mL via INTRAVENOUS

## 2023-11-26 MED ORDER — METHOCARBAMOL 500 MG PO TABS
500.0000 mg | ORAL_TABLET | Freq: Four times a day (QID) | ORAL | Status: DC
  Administered 2023-11-26 – 2023-11-28 (×9): 500 mg via ORAL
  Filled 2023-11-26 (×9): qty 1

## 2023-11-26 NOTE — Discharge Instructions (Signed)
  Your surgeon has performed an operation on your back to relieve pressure on one or more nerves. Many times, patients feel better immediately after surgery and can "overdo it." Even if you feel well, it is important that you follow these activity guidelines. If you do not let your back heal properly from the surgery, you can increase the chance of hardware complications and/or return of your symptoms. The following are instructions to help in your recovery once you have been discharged from the hospital.  Do not use NSAIDs for 3 months after surgery.   Activity    No bending, lifting, or twisting ("BLT"). Avoid lifting objects heavier than 10 pounds (gallon milk jug).  Where possible, avoid household activities that involve lifting, bending, pushing, or pulling such as laundry, vacuuming, grocery shopping, and childcare. Try to arrange for help from friends and family for these activities while your back heals.  Increase physical activity slowly as tolerated.  Taking short walks is encouraged, but avoid strenuous exercise. Do not jog, run, bicycle, lift weights, or participate in any other exercises unless specifically allowed by your doctor. Avoid prolonged sitting, including car rides.  Talk to your doctor before resuming sexual activity.  You should not drive until cleared by your doctor.  Until released by your doctor, you should not return to work or school.  You should rest at home and let your body heal.   You may shower three days after your surgery.  After showering, lightly dab your incision dry. Do not take a tub bath or go swimming for 3 weeks, or until approved by your doctor at your follow-up appointment.  If you smoke, we strongly recommend that you quit.  Smoking has been proven to interfere with normal healing in your back and will dramatically reduce the success rate of your surgery. Please contact [facility] (800-QUIT-NOW) and use the resources at www.[facility].com for  assistance in stopping smoking.  Surgical Incision   If you have a dressing on your incision, you may remove it three days after your surgery. Keep your incision area clean and dry.  Your incision was closed with Dermabond glue. The glue should begin to peel away within about a week.  Diet            You may return to your usual diet. Be sure to stay hydrated.  When to Contact us  Although your surgery and recovery will likely be uneventful, you may have some residual numbness, aches, and pains in your back and/or legs. This is normal and should improve in the next few weeks.  However, should you experience any of the following, contact us immediately: New numbness or weakness Pain that is progressively getting worse, and is not relieved by your pain medications or rest Bleeding, redness, swelling, pain, or drainage from surgical incision Chills or flu-like symptoms Fever greater than 101.0 F (38.3 C) Problems with bowel or bladder functions Difficulty breathing or shortness of breath Warmth, tenderness, or swelling in your calf  Contact Information How to contact us:  If you have any questions/concerns before or after surgery, you can reach [place] at [phone], or you can send a mychart message. We can be reached by phone or mychart 8am-4pm, Monday-Friday.  *Please note: Calls after 4pm are forwarded to a third party answering service. Mychart messages are not routinely monitored during evenings, weekends, and holidays. Please call our office to contact the answering service for urgent concerns during non-business hours.

## 2023-11-26 NOTE — Evaluation (Signed)
Occupational Therapy Evaluation Patient Details Name: Danielle Harrison MRN: [id number] DOB: [birth year] Today's Date: 11/26/2023   History of Present Illness   Janaki Exley is a 48 y.o. 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 Patient underwent surgery on 12/11 d/t thoracic osteomyelitis, discitis, epidural abscess. Patient is s/p T4-9 Post Spinal Fusion, T6-7 thoracic laminectomy, right sided transpedicular decompression and disc debridement.     Clinical Impressions PTA, pt was independent with mobility and ADLs (RW use for community mobility recently). Pt currently requires supervision for bed mobility with good ability to carryover taught logroll technique, t/f bathroom with RW + CGA for toileting needs. Completes various height surface transfers (BSC over commode, recliner) with CGA, stands to wash hands without LOB. Pt would benefit from skilled OT services to address noted impairments and functional limitations (see below for any additional details) in order to maximize safety and independence while minimizing falls risk and caregiver burden. Anticipate the need for follow up [facility] OT services upon acute hospital DC.      If plan is discharge home, recommend the following:   A little help with walking and/or transfers;A little help with bathing/dressing/bathroom     Functional Status Assessment   Patient has had a recent decline in their functional status and demonstrates the ability to make significant improvements in function in a reasonable and predictable amount of time.     Equipment Recommendations   BSC/3in1     Recommendations for Other Services   Other (comment)     Precautions/Restrictions    Precautions Precautions: Back Precaution Booklet Issued: Yes (comment) Recall of Precautions/Restrictions: Intact Precaution/Restrictions Comments: Patient able to maintain precautions well throughout session without cues. Required Braces or Orthoses:  (no brace required per orders) Restrictions Weight Bearing Restrictions Per Provider Order: No     Mobility Bed Mobility Overal bed mobility: Needs Assistance Bed Mobility: Rolling, Sidelying to Sit Rolling: Contact guard assist, Used rails Sidelying to sit: Used rails, Contact guard assist       General bed mobility comments: cues for log roll technique    Transfers Overall transfer level: Needs assistance Equipment used: Rolling walker (2 wheels) Transfers: Sit to/from Stand, Bed to chair/wheelchair/BSC Sit to Stand: Contact guard assist     Step pivot transfers: Supervision     General transfer comment: able to complete with good posture and adherence to back precautions, guarded movements due to elevated pain levels. Multiple transfers assessed - sit>stand from EOB, to [facility] and recliner      Balance Overall balance assessment: Needs assistance Sitting-balance support: Feet supported, No upper extremity supported Sitting balance-Leahy Scale: Normal Sitting balance - Comments: no LOB while donning socks   Standing balance support: Bilateral upper extremity supported, During functional activity, Reliant on assistive device for balance Standing balance-Leahy Scale: Good Standing balance comment: steady with RW                           ADL either performed or assessed with clinical judgement   ADL Overall ADL's : Needs assistance/impaired     Grooming: Wash/dry hands;Standing Grooming Details (indicate cue type and reason): CGA for safety, sink level             Lower Body Dressing: Supervision/safety Lower Body Dressing Details (indicate  cue type and reason): dons socks supervision sitting EOB with  figure four, no difficulties noted, good adherence to back precautions Toilet Transfer: Contact guard assist;Supervision/safety;BSC/3in1;Rolling walker (2 wheels);Cueing for safety;Ambulation Toilet Transfer Details (indicate cue type and reason): BSC over toilet, cues for hand placement. Light CGA fading to supervision. Toileting- Clothing Manipulation and Hygiene: Sitting/lateral lean;Adhering to back precautions;Supervision/safety Toileting - Clothing Manipulation Details (indicate cue type and reason): good adherence to precautions     Functional mobility during ADLs: Supervision/safety;Contact guard assist;Rolling walker (2 wheels)       Vision         Perception         Praxis         Pertinent Vitals/Pain Pain Assessment Pain Assessment: 0-10 Pain Score: 10-Worst pain ever Pain Location: Mid to Upper Back Pain Descriptors / Indicators: Sore, Tender Pain Intervention(s): Limited activity within patient's tolerance, Monitored during session, Repositioned     Extremity/Trunk Assessment Upper Extremity Assessment Upper Extremity Assessment: Overall WFL for tasks assessed   Lower Extremity Assessment Lower Extremity Assessment: Overall WFL for tasks assessed   Cervical / Trunk Assessment Cervical / Trunk Assessment: Back Surgery   Communication Communication Communication: No apparent difficulties   Cognition Arousal: Alert Behavior During Therapy: WFL for tasks assessed/performed Cognition: No apparent impairments                               Following commands: Intact       Cueing  General Comments   Cueing Techniques: Verbal cues      Exercises     Shoulder Instructions      Home Living Family/patient expects to be discharged to:: Private residence Living Arrangements: Children Available Help at Discharge: Family Type of Home: Other(Comment) Home Access: Level entry Entrance Stairs-Number of Steps: 3 Entrance Stairs-Rails:  Right;Left Home Layout: Two level;Other (Comment) Alternate Level Stairs-Number of Steps: 18 Alternate Level Stairs-Rails: Right Bathroom Shower/Tub: Walk-in shower;Other (comment);Tub/shower unit   Bathroom Toilet: Standard     Home Equipment: Pharmacist, hospital (2 wheels)          Prior Functioning/Environment Prior Level of Function : Independent/Modified Independent             Mobility Comments: IND; was using a RW after last admission, but stopped after a few days ambulating without AD in home. Reports she was furniture walking. Does endorse using the RW for community mobility. Denies falls. ADLs Comments: IND with ADLs    OT Problem List: Decreased range of motion;Decreased activity tolerance;Impaired balance (sitting and/or standing);Pain   OT Treatment/Interventions: Self-care/ADL training;Therapeutic exercise;DME and/or AE instruction;Therapeutic activities;Patient/family education;Balance training      OT Goals(Current goals can be found in the care plan section)   Acute Rehab OT Goals OT Goal Formulation: With patient Time For Goal Achievement: 12/10/23 Potential to Achieve Goals: Good ADL Goals Pt Will Perform Grooming: with modified independence;standing Pt Will Perform Lower Body Bathing: with modified independence;sitting/lateral leans;with adaptive equipment Pt Will Perform Lower Body Dressing: with modified independence;sit to/from stand;sitting/lateral leans;with adaptive equipment Pt Will Transfer to Toilet: with modified independence;ambulating;bedside commode Pt Will Perform Toileting - Clothing Manipulation and hygiene: with modified independence;sitting/lateral leans;sit to/from stand;with adaptive equipment   OT Frequency:  Min 1X/week    Co-evaluation PT/OT/SLP Co-Evaluation/Treatment: Yes Reason for Co-Treatment: For patient/therapist safety;To address functional/ADL transfers PT goals addressed during session: Mobility/safety with  mobility OT goals addressed during session: ADL's and self-care  AM-PAC OT "6 Clicks" Daily Activity     Outcome Measure Help from another person eating meals?: None Help from another person taking care of personal grooming?: None Help from another person toileting, which includes using toliet, bedpan, or urinal?: A Little Help from another person bathing (including washing, rinsing, drying)?: A Little Help from another person to put on and taking off regular upper body clothing?: None Help from another person to put on and taking off regular lower body clothing?: A Little 6 Click Score: 21   End of Session Equipment Utilized During Treatment: Gait belt;Rolling walker (2 wheels) Nurse Communication: Mobility status  Activity Tolerance: Patient tolerated treatment well Patient left: in chair;with call bell/phone within reach;with chair alarm set  OT Visit Diagnosis: Pain;Other abnormalities of gait and mobility (R26.89) Pain - Right/Left:  (back pain) Pain - part of body:  (upper/mid back)                Time: 5956-3875 OT Time Calculation (min): 24 min Charges:  OT General Charges $OT Visit: 1 Visit OT Evaluation $OT Eval Low Complexity: 1 Low  Lorenza Winkleman L. Aspasia Rude, OTR/L  11/26/23, 12:59 PM

## 2023-11-26 NOTE — Progress Notes (Addendum)
 PROGRESS NOTE    Danielle Harrison  NGE:[id number] DOB: 1976/04/06 DOA: 11/23/2023 PCP: [name], NP    Brief Narrative:   48 y.o. 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 Pt was discharged on daptomycin given through PICC line in right arm. Pt states that her left great toe pain has significantly improved.  No more abdominal pain, nausea vomiting or diarrhea.     She continues to have muscle spasm and increasing pain across her whole front chest. Also has middle back pain.  Her chest pain is sharp, severe, pleuritic, aggravated by deep breath.  Associated with shortness of breath.  No cough, fever or chills.  No loss control of bladder or bowel movement.  Denies symptoms of UTI.  Patient states that she did not drink alcohol since last admission.  Patient denies drug use.   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 Has drain - pt/ot consulted - ID following, continuing  vanc  Asthma: Stable -As needed Mucinex, bronchodilators    HTN (hypertension) Bp persistently elevated -IV hydralazine as needed - home Metoprolol - add amlodipine   Hyperkalemia:  Resolved - monitor   Recent septic arthritis of left foot [facility]): s/p of washout.  Surgical site is healing well -On IV vancomycin as above - podiatry ok with d/c of sutures - [place] advises 2 wk f/u   DVT prophylaxis: lovenox Code Status: Full Family Communication:None Disposition Plan: Status is: Inpatient Remains inpatient appropriate because: Spinal osteomyelitis   Level of care: Med-Surg  Consultants:  Neurosurgery IR ID  Procedures:  Spine surgery 2/11  Antimicrobials: Vancomycin   Subjective: Seen and examined.  Sitting up in bed.  Pain stable  Objective: Vitals:   11/26/23 0148 11/26/23 0400 11/26/23 0533 11/26/23 0759  BP: (!) 160/97 (!) 179/105 (!) 165/84 (!) 150/97  Pulse: 94 88  89  Resp: 18   18  Temp:  97.8 F (36.6 C)  98.2 F (36.8 C)  TempSrc:  Oral    SpO2:  100%  100%  Weight:      Height:        Intake/Output Summary (Last 24 hours) at 11/26/2023 1149 Last data filed at 11/26/2023 0531 Gross per 24 hour  Intake 1895 ml  Output 3470 ml  Net -1575 ml   Filed Weights   11/23/23 1544 11/25/23 1104  Weight: 49.9 kg 49.9 kg    Examination:  General exam: No acute distress Respiratory system: Lungs clear.  Normal breathing.  Room air Cardiovascular system: S1-S2, RRR, no murmurs, no pedal edema Gastrointestinal system: Soft, NT/ND, normal bowel sounds Central nervous system:  Alert and oriented. No focal neurological deficits. Extremities: warm, moving both   Skin: No rashes, lesions or ulcers Psychiatry: Judgement and insight appear normal. Mood & affect appropriate.     Data Reviewed: I have personally reviewed following labs and imaging studies  CBC: Recent Labs  Lab 11/23/23 1549 11/24/23 0559 11/25/23 0806  WBC 13.1* 10.9* 10.4   NEUTROABS  --   --  6.3  HGB 11.1* 10.0* 10.1*  HCT 34.7* 31.1* 31.2*  MCV 95.6 95.4 95.7  PLT 730* 680* 548*   Basic Metabolic Panel: Recent Labs  Lab 11/23/23 1549 11/24/23 0559 11/25/23 0539 11/25/23 0806  NA 133* 135  --  130*  K 5.6* 4.0  --  4.0  CL 103 107  --  101  CO2 19* 20*  --  20*  GLUCOSE 176* 104*  --  161*  BUN 20 15  --  15  CREATININE 0.56 0.49 0.56 0.41*  CALCIUM 10.0 9.2  --  9.2   GFR: Estimated Creatinine Clearance: 62.4 mL/min (A) (by C-G formula based on SCr of 0.41 mg/dL (L)). Liver Function Tests: Recent Labs  Lab 11/23/23 1549  AST 25  ALT 23  ALKPHOS 105  BILITOT 0.7  PROT 8.6*  ALBUMIN 4.0   Recent Labs  Lab 11/23/23 1549  LIPASE 21   No results for input(s): "AMMONIA" in the last 168 hours. Coagulation Profile: Recent Labs  Lab 11/23/23 2155  INR 1.1   Cardiac Enzymes: No results for input(s): "CKTOTAL", "CKMB", "CKMBINDEX", "TROPONINI" in the last 168 hours. BNP (last 3 results) No results for input(s): "PROBNP" in the last 8760 hours. HbA1C: No results for input(s): "HGBA1C" in the last 72 hours. CBG: Recent Labs  Lab 11/25/23 1112  GLUCAP 126*   Lipid Profile: No results for input(s): "CHOL", "HDL", "LDLCALC", "TRIG", "CHOLHDL", "LDLDIRECT" in the last 72 hours. Thyroid Function Tests: No results for input(s): "TSH", "T4TOTAL", "FREET4", "T3FREE", "THYROIDAB" in the last 72 hours. Anemia Panel: No results for input(s): "VITAMINB12", "FOLATE", "FERRITIN", "TIBC", "IRON", "RETICCTPCT" in the last 72 hours. Sepsis Labs: Recent Labs  Lab 11/23/23 1549 11/23/23 1725 11/23/23 2136  PROCALCITON <0.10  --   --   LATICACIDVEN  --  3.4* 2.1*    Recent Results (from the past 240 hours)  Culture, blood (routine x 2)     Status: None (Preliminary result)   Collection Time: 11/23/23  5:29 PM   Specimen: BLOOD  Result Value Ref Range Status   Specimen Description BLOOD BLOOD LEFT FOREARM  Final   Special Requests    Final    BOTTLES DRAWN AEROBIC AND ANAEROBIC Blood Culture results may not be optimal due to an excessive volume of blood received in culture bottles   Culture   Final    NO GROWTH 3 DAYS Performed at [facility], [address] [place], [place] [address]    Report Status PENDING  Incomplete  Culture, blood (routine x 2)     Status: None (Preliminary result)   Collection Time: 11/23/23  5:29 PM   Specimen: BLOOD  Result Value Ref Range Status   Specimen Description BLOOD BLOOD RIGHT ARM  Final   Special Requests   Final    BOTTLES DRAWN AEROBIC AND ANAEROBIC Blood Culture results may not be optimal due to an inadequate volume of blood received in culture bottles   Culture   Final    NO GROWTH 3 DAYS Performed at [facility], [address] [place],  [place] [address]    Report Status PENDING  Incomplete  Aerobic/Anaerobic Culture w Gram Stain (surgical/deep wound)     Status: None (Preliminary result)   Collection Time: 11/25/23  2:44 PM   Specimen: PATH Soft tissue  Result Value Ref Range Status   Specimen Description WOUND  Final   Special Requests EPIDURAL SPACE  Final   Gram Stain   Final    NO WBC SEEN NO ORGANISMS SEEN Performed at [facility], [address], [place], [place] [address]    Culture PENDING  Incomplete   Report Status PENDING  Incomplete  Aerobic/Anaerobic Culture w Gram Stain (surgical/deep wound)     Status: None (Preliminary result)   Collection Time: 11/25/23  2:51 PM   Specimen: PATH Soft tissue  Result Value Ref Range Status   Specimen Description WOUND  Final   Special Requests DISC SPACE  Final   Gram Stain   Final    RARE WBC PRESENT, PREDOMINANTLY PMN NO ORGANISMS SEEN    Culture   Final    NO GROWTH < 12 HOURS Performed at [facility], [address], [place], [place] [address]    Report Status PENDING  Incomplete         Radiology Studies: DG Thoracic Spine 2 View Result Date:  11/25/2023 CLINICAL DATA:  Elective surgery. EXAM: THORACIC SPINE 2 VIEWS COMPARISON:  Preoperative MRI. FINDINGS: Seven fluoroscopic spot views of the thoracic spine submitted from the operating room. Surgical instruments localize over the midthoracic spine with multilevel pedicle screw fixation. Fluoroscopy time 1 minutes. Dose 54.52 mGy. IMPRESSION: Procedural fluoroscopy during thoracic spine surgery. Electronically Signed   By: Narda Rutherford M.D.   On: 11/25/2023 16:08   DG C-Arm 1-60 Min-No Report Result Date: 11/25/2023 Fluoroscopy was utilized by the requesting physician.  No radiographic interpretation.   DG C-Arm 1-60 Min-No Report Result Date: 11/25/2023 Fluoroscopy was utilized by the requesting physician.  No radiographic interpretation.   DG C-Arm 1-60 Min-No Report Result Date: 11/25/2023 Fluoroscopy was utilized by the requesting physician.  No radiographic interpretation.   DG C-Arm 1-60 Min-No Report Result Date: 11/25/2023 Fluoroscopy was utilized by the requesting physician.  No radiographic interpretation.        Scheduled Meds:  baclofen  10 mg Oral Daily   Chlorhexidine Gluconate Cloth  6 each Topical Daily   docusate sodium  100 mg Oral BID   enoxaparin (LOVENOX) injection  40 mg Subcutaneous Q24H   gabapentin  100 mg Oral TID   methocarbamol  500 mg Oral Q6H   Or   methocarbamol (ROBAXIN) injection  500 mg Intravenous Q6H   metoprolol tartrate  100 mg Oral BID   multivitamin with minerals  1 tablet Oral Daily   senna  1 tablet Oral BID   sodium chloride flush  10-40 mL Intracatheter Q12H   sodium zirconium cyclosilicate  10 g Oral Once   Continuous Infusions:  sodium chloride     vancomycin 750 mg (11/26/23 0531)     LOS: 3 days      Silvano Bilis, MD [facility]   If 7PM-7AM, please contact night-coverage  11/26/2023, 11:49 AM

## 2023-11-26 NOTE — Progress Notes (Signed)
Date of Admission:  11/23/2023    ID: Danielle Harrison is a 48 y.o. female  Principal Problem:   Osteomyelitis of thoracic spine (HCC) Active Problems:   Asthma   Septic arthritis of left foot (HCC)   HTN (hypertension)   Hyperkalemia   Epidural abscess   Acute osteomyelitis of thoracic spine (HCC)   Pathologic fracture of thoracic vertebrae, initial encounter    Subjective: Doing better Says from of chest pain has resolved Back pain is bearable  Medications:   baclofen  10 mg Oral Daily   Chlorhexidine Gluconate Cloth  6 each Topical Daily   docusate sodium  100 mg Oral BID   enoxaparin (LOVENOX) injection  40 mg Subcutaneous Q24H   gabapentin  100 mg Oral TID   methocarbamol  500 mg Oral Q6H   Or   methocarbamol (ROBAXIN) injection  500 mg Intravenous Q6H   metoprolol tartrate  100 mg Oral BID   multivitamin with minerals  1 tablet Oral Daily   senna  1 tablet Oral BID   sodium chloride flush  10-40 mL Intracatheter Q12H   sodium zirconium cyclosilicate  10 g Oral Once    Objective: Vital signs in last 24 hours: Patient Vitals for the past 24 hrs:  BP Temp Temp src Pulse Resp SpO2  11/26/23 0759 (!) 150/97 98.2 F (36.8 C) -- 89 18 100 %  11/26/23 0533 (!) 165/84 -- -- -- -- --  11/26/23 0400 (!) 179/105 97.8 F (36.6 C) Oral 88 -- 100 %  11/26/23 0148 (!) 160/97 -- -- 94 18 --  11/26/23 0105 (!) 177/108 -- -- -- -- --  11/26/23 0051 (!) 177/108 97.6 F (36.4 C) -- 88 20 100 %  11/25/23 2139 (!) 166/103 98.9 F (37.2 C) Oral (!) 103 18 100 %  11/25/23 1745 (!) 156/94 98.3 F (36.8 C) Oral 95 16 100 %  11/25/23 1726 134/88 -- -- 94 18 100 %  11/25/23 1700 (!) 162/103 (!) 97.2 F (36.2 C) -- 84 19 100 %  11/25/23 1646 (!) 197/113 -- -- 78 19 100 %  11/25/23 1645 (!) 197/113 -- -- 80 19 100 %  11/25/23 1630 (!) 191/110 -- -- 78 (!) 21 100 %  11/25/23 1615 (!) 174/110 97.6 F (36.4 C) -- 78 20 100 %     LDA Rt PICc  PHYSICAL EXAM:  General: Alert,  cooperative, no distress, appears stated age.  Back: surgical dressing over thoracic spine- drain present Lungs: b/l air entry Heart: s1s2 Abdomen: Soft, non-tender,not distended. Bowel sounds normal. No masses Extremities: atraumatic, no cyanosis. No edema. No clubbing Skin: No rashes or lesions. Or bruising Lymph: Cervical, supraclavicular normal. Neurologic: Grossly non-focal  Lab Results    Latest Ref Rng & Units 11/25/2023    8:06 AM 11/24/2023    5:59 AM 11/23/2023    3:49 PM  CBC  WBC 4.0 - 10.5 K/uL 10.4  10.9  13.1   Hemoglobin 12.0 - 15.0 g/dL 65.7  84.6  96.2   Hematocrit 36.0 - 46.0 % 31.2  31.1  34.7   Platelets 150 - 400 K/uL 548  680  730        Latest Ref Rng & Units 11/25/2023    8:06 AM 11/25/2023    5:39 AM 11/24/2023    5:59 AM  CMP  Glucose 70 - 99 mg/dL 952   841   BUN 6 - 20 mg/dL 15   15   Creatinine  0.44 - 1.00 mg/dL 4.09  8.11  9.14   Sodium 135 - 145 mmol/L 130   135   Potassium 3.5 - 5.1 mmol/L 4.0   4.0   Chloride 98 - 111 mmol/L 101   107   CO2 22 - 32 mmol/L 20   20   Calcium 8.9 - 10.3 mg/dL 9.2   9.2       Microbiology:  Studies/Results: DG Thoracic Spine 2 View Result Date: 11/25/2023 CLINICAL DATA:  Elective surgery. EXAM: THORACIC SPINE 2 VIEWS COMPARISON:  Preoperative MRI. FINDINGS: Seven fluoroscopic spot views of the thoracic spine submitted from the operating room. Surgical instruments localize over the midthoracic spine with multilevel pedicle screw fixation. Fluoroscopy time 1 minutes. Dose 54.52 mGy. IMPRESSION: Procedural fluoroscopy during thoracic spine surgery. Electronically Signed   By: Narda Rutherford M.D.   On: 11/25/2023 16:08   DG C-Arm 1-60 Min-No Report Result Date: 11/25/2023 Fluoroscopy was utilized by the requesting physician.  No radiographic interpretation.   DG C-Arm 1-60 Min-No Report Result Date: 11/25/2023 Fluoroscopy was utilized by the requesting physician.  No radiographic interpretation.   DG C-Arm 1-60  Min-No Report Result Date: 11/25/2023 Fluoroscopy was utilized by the requesting physician.  No radiographic interpretation.   DG C-Arm 1-60 Min-No Report Result Date: 11/25/2023 Fluoroscopy was utilized by the requesting physician.  No radiographic interpretation.     Assessment/Plan: MRSA bacteremia in Jan 2025 with T6-T7 discitis  and left great toe septic arthritis and osteo She has been on appropriate antibiotic since last admission and it is now 23 days of Iv daptomycin and there is progression of the vertebral osteomyelitis and now with epidural abscess and nerve compression causing severe pain. She underwent surgery on 11/25/23 1. Posterior Segmental Instrumentation T4 to T9 2. Posterolateral arthrodesis from T4 to T9 3. Thoracic lamina to me at T6 and T7 4.  Right sided transpedicular disc debridement and washout of ventral epidural abscess 5.  Autograft harvest spinous processes 6.  Allograft placement  Culture sent She is on vancomycin Pt will need 6- 12 weeks of antibiotic post surgery depending on culture results  She is going to need rifampin  along with vanco  Sed rate and crp have improved from last month Anemia   Thrombocytosis due to infection   Left great toe infeciton with septic arthritis and osteo of the Met- s/p resection- site has healed well Sutures will have to be removed   H/o pancreatitis- had small pseudocysts in CT from 1/14 She is now totally asymptomatic May repeat CT to look at the size of the pseudocyst?/  Discussed the management with patient

## 2023-11-26 NOTE — Progress Notes (Signed)
   Neurosurgery Progress Note  History: Danielle Harrison is s/p T4-9 PSF. T6-7 thoracic laminectomy.   POD1: expected back pain overnight. She denies any leg pain   Physical Exam: Vitals:   11/26/23 0400 11/26/23 0533  BP: (!) 179/105 (!) 165/84  Pulse: 88   Resp:    Temp: 97.8 F (36.6 C)   SpO2: 100%     AA Ox3 CNI  Strength:5/5 throughout BLE  HV 370 since surgery Incision covered with incisional wound vac  Data:  Other tests/results:  Surgical path pending   Assessment/Plan:  Danielle Harrison is a 48 y.o presenting with epidural abscess s/p thoracic decompression and fusion.   - mobilize - pain control - ok for DVT prophylaxis from neurosurgical standpoint  - PTOT as tolerated. No need for brace - continue HV and wound vac - abx per ID  - remainder of care per IM  Manning Charity PA-C Department of Neurosurgery

## 2023-11-26 NOTE — Telephone Encounter (Signed)
In error - noted out of range labs from 2/2 but updated labs in Epic from 2/11

## 2023-11-26 NOTE — Evaluation (Signed)
Physical Therapy Evaluation Patient Details Name: Danielle Harrison MRN: [id number] DOB: 10-05-1976 Today's Date: 11/26/2023  History of Present Illness  Danielle Harrison is a 48 y.o. 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 Patient underwent surgery on 12/11 d/t thoracic osteomyelitis, discitis, epidural abscess. Patient is s/p T4-9 Post Spinal Fusion, T6-7 thoracic laminectomy, right sided transpedicular decompression and disc debridement.   Clinical Impression  Patient supine in bed upon arrival, agreeable to PT evaluation. Prior to admission patient reports was IND with mobility and ADLs. Recent use of RW after previous admission but had stopped utilizing in the home, only used for community mobility. Patient educated on spinal precautions with patient demo ability to maintain precautions throughout session.   Patient require supv/CGA for bed mobility, increased time for completion due to pain. Able to complete STS transfer with supv/CGA from various height surfaces including bed/toilet during session. Pt able to ambulate with RW approx 25 ft with supv/CGA. Limited distance due to pain this date. Patient left in recliner with all needs in reach and chair alarm set. RN notified of request for pain medication and mobility status. Patient will benefit from skilled acute PT services to address functional impairments (see below for additional) and maximize functional mobility. Anticipate the need for follow up PT services upon acute hospital discharge. Will continue to follow acutely.        If plan is discharge home, recommend the following: A little help with walking and/or transfers;Assist for transportation;Help with stairs or ramp for entrance    Can travel by private vehicle        Equipment Recommendations None recommended by PT  Recommendations for Other Services       Functional Status Assessment Patient has had a recent decline in their functional status and demonstrates the ability to make significant improvements in function in a reasonable and predictable amount of time.     Precautions / Restrictions Precautions Precautions: Back Precaution Booklet Issued: Yes (comment) Recall of Precautions/Restrictions: Intact Precaution/Restrictions Comments: Patient able to maintain precautions well throughout session without cues. Required Braces or Orthoses:  (no brace required per orders) Restrictions Weight Bearing Restrictions Per Provider Order: No      Mobility  Bed Mobility Overal bed mobility: Needs Assistance Bed Mobility: Rolling, Sidelying to Sit Rolling: Contact guard assist, Used rails Sidelying to sit: Used rails, Contact guard assist       General bed mobility comments: able to complete with steadying assist; cues for log roll technique provided    Transfers Overall transfer level: Needs assistance Equipment used: Rolling walker (2 wheels) Transfers: Sit to/from Stand, Bed to chair/wheelchair/BSC Sit to Stand: Supervision, Contact guard assist           General transfer comment: able to complete STS with RW, with steadying assist. Able to maintain precautions. Guarded/slow movements due to pain levels. Multiple STS from various heights    Ambulation/Gait Ambulation/Gait assistance: Supervision, Contact guard assist Gait Distance (Feet): 25 Feet Assistive device: Rolling walker (2 wheels)   Gait velocity: Decreased     General Gait Details: Pt able to ambulate approx 25 ft with slow but steady gait overall; close supv with intermittent CGA. Good posture noted throughout. Distance limited due to pain.  Stairs            Wheelchair  Mobility     Tilt Bed    Modified Rankin  (Stroke Patients Only)       Balance Overall balance assessment: Needs assistance Sitting-balance support: Feet supported, No upper extremity supported Sitting balance-Leahy Scale: Normal     Standing balance support: Bilateral upper extremity supported, During functional activity, Reliant on assistive device for balance Standing balance-Leahy Scale: Good Standing balance comment: steady with AD                             Pertinent Vitals/Pain Pain Assessment Pain Assessment: 0-10 Pain Score: 10-Worst pain ever Pain Location: Mid to Upper Back Pain Descriptors / Indicators: Sore, Tender Pain Intervention(s): Limited activity within patient's tolerance, Monitored during session, Repositioned, Patient requesting pain meds-RN notified    Home Living Family/patient expects to be discharged to:: Private residence Living Arrangements: Children Available Help at Discharge: Family Type of Home: Other(Comment) Rosanne Sack) Home Access: Level entry Entrance Stairs-Rails: Right;Left Entrance Stairs-Number of Steps: 3 Alternate Level Stairs-Number of Steps: 18 Home Layout: Two level;Other (Comment) (able to live on main level if needed) Home Equipment: Pharmacist, hospital (2 wheels)      Prior Function Prior Level of Function : Independent/Modified Independent             Mobility Comments: IND; was using a RW after last admission, but stopped after a few days ambulating without AD in home. Reports she was furniture walking. Does endorse using the RW for community mobility. Denies falls. ADLs Comments: IND with ADLs     Extremity/Trunk Assessment   Upper Extremity Assessment Upper Extremity Assessment: Overall WFL for tasks assessed    Lower Extremity Assessment Lower Extremity Assessment: Overall WFL for tasks assessed    Cervical / Trunk Assessment Cervical / Trunk Assessment: Back Surgery  Communication   Communication Communication: No apparent  difficulties    Cognition Arousal: Alert Behavior During Therapy: WFL for tasks assessed/performed                             Following commands: Intact       Cueing Cueing Techniques: Verbal cues     General Comments General comments (skin integrity, edema, etc.): lines intact; wound vac dressing intact    Exercises     Assessment/Plan    PT Assessment Patient needs continued PT services  PT Problem List Decreased strength;Decreased activity tolerance;Decreased balance;Decreased mobility;Pain       PT Treatment Interventions DME instruction;Gait training;Stair training;Functional mobility training;Therapeutic activities;Therapeutic exercise;Balance training;Neuromuscular re-education    PT Goals (Current goals can be found in the Care Plan section)  Acute Rehab PT Goals Patient Stated Goal: Get Back Home PT Goal Formulation: With patient Time For Goal Achievement: 12/10/23 Potential to Achieve Goals: Good    Frequency Min 1X/week     Co-evaluation PT/OT/SLP Co-Evaluation/Treatment: Yes Reason for Co-Treatment: For patient/therapist safety;To address functional/ADL transfers PT goals addressed during session: Mobility/safety with mobility OT goals addressed during session: ADL's and self-care       AM-PAC PT "6 Clicks" Mobility  Outcome Measure Help needed turning from your back to your side while in a flat bed without using bedrails?: A Little Help needed moving from lying on your back to sitting on the side of a flat bed without using bedrails?: A Little Help needed moving to and from a bed to a chair (including a wheelchair)?: A Little Help needed standing  up from a chair using your arms (e.g., wheelchair or bedside chair)?: A Little Help needed to walk in hospital room?: A Little Help needed climbing 3-5 steps with a railing? : A Little 6 Click Score: 18    End of Session Equipment Utilized During Treatment: Gait belt Activity Tolerance:  Patient tolerated treatment well Patient left: in chair;with chair alarm set;with call bell/phone within reach Nurse Communication: Mobility status PT Visit Diagnosis: Unsteadiness on feet (R26.81);Pain;Other abnormalities of gait and mobility (R26.89) Pain - part of body:  (Low Back)    Time: 0981-1914 PT Time Calculation (min) (ACUTE ONLY): 24 min   Charges:   PT Evaluation $PT Eval Low Complexity: 1 Low   PT General Charges $$ ACUTE PT VISIT: 1 Visit         Howie Ill, PT, DPT 11/26/23 12:47 PM

## 2023-11-27 ENCOUNTER — Ambulatory Visit: Payer: Medicaid Other | Admitting: Podiatry

## 2023-11-27 ENCOUNTER — Encounter: Payer: Self-pay | Admitting: Neurosurgery

## 2023-11-27 DIAGNOSIS — G062 Extradural and subdural abscess, unspecified: Secondary | ICD-10-CM | POA: Diagnosis not present

## 2023-11-27 DIAGNOSIS — A4902 Methicillin resistant Staphylococcus aureus infection, unspecified site: Secondary | ICD-10-CM

## 2023-11-27 DIAGNOSIS — M4624 Osteomyelitis of vertebra, thoracic region: Secondary | ICD-10-CM | POA: Diagnosis not present

## 2023-11-27 LAB — CBC
HCT: 27.5 % — ABNORMAL LOW (ref 36.0–46.0)
Hemoglobin: 9.1 g/dL — ABNORMAL LOW (ref 12.0–15.0)
MCH: 30.4 pg (ref 26.0–34.0)
MCHC: 33.1 g/dL (ref 30.0–36.0)
MCV: 92 fL (ref 80.0–100.0)
Platelets: 471 10*3/uL — ABNORMAL HIGH (ref 150–400)
RBC: 2.99 MIL/uL — ABNORMAL LOW (ref 3.87–5.11)
RDW: 14.2 % (ref 11.5–15.5)
WBC: 13.9 10*3/uL — ABNORMAL HIGH (ref 4.0–10.5)
nRBC: 0 % (ref 0.0–0.2)

## 2023-11-27 LAB — VANCOMYCIN, TROUGH: Vancomycin Tr: 8 ug/mL — ABNORMAL LOW (ref 15–20)

## 2023-11-27 LAB — CREATININE, SERUM
Creatinine, Ser: 0.42 mg/dL — ABNORMAL LOW (ref 0.44–1.00)
GFR, Estimated: >60 mL/min (ref >60)

## 2023-11-27 MED ORDER — RIFAMPIN 300 MG PO CAPS
300.0000 mg | ORAL_CAPSULE | Freq: Two times a day (BID) | ORAL | Status: DC
  Administered 2023-11-27 – 2023-11-28 (×2): 300 mg via ORAL
  Filled 2023-11-27 (×3): qty 1

## 2023-11-27 MED ORDER — VANCOMYCIN HCL 500 MG/100ML IV SOLN
500.0000 mg | Freq: Once | INTRAVENOUS | Status: AC
  Administered 2023-11-27: 500 mg via INTRAVENOUS
  Filled 2023-11-27 (×2): qty 100

## 2023-11-27 MED ORDER — VANCOMYCIN HCL 1250 MG/250ML IV SOLN
1250.0000 mg | Freq: Two times a day (BID) | INTRAVENOUS | Status: DC
  Administered 2023-11-27 – 2023-11-28 (×3): 1250 mg via INTRAVENOUS
  Filled 2023-11-27 (×4): qty 250

## 2023-11-27 MED ORDER — LACTULOSE 10 GM/15ML PO SOLN
30.0000 g | Freq: Once | ORAL | Status: AC
  Administered 2023-11-27: 30 g via ORAL
  Filled 2023-11-27: qty 60

## 2023-11-27 NOTE — Progress Notes (Signed)
Occupational Therapy Treatment Patient Details Name: Danielle Harrison MRN: [id number] DOB: [birth year] Today's Date: 11/27/2023   History of present illness Danielle Harrison is a 48 y.o. 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 Patient underwent surgery on 12/11 d/t thoracic osteomyelitis, discitis, epidural abscess. Patient is s/p T4-9 Post Spinal Fusion, T6-7 thoracic laminectomy, right sided transpedicular decompression and disc debridement.   OT comments  Pt seen for OT tx session, completes transfers/mobility/toileting tasks with supervision. No LOB noted, good recall and carryover of spinal precautions. Notes that pain has been better controlled today. Upon return home, pt plans on staying on lower level of townhouse as she recovers. Discharge recommendations remain appropriate, OT will continue to follow for functional gains.       If plan is discharge home, recommend the following:  A little help with walking and/or transfers;A little help with bathing/dressing/bathroom;Assist for transportation;Help with stairs or ramp for entrance;Assistance with cooking/housework   Equipment Recommendations  BSC/3in1    Recommendations for Other Services Other (comment)    Precautions / Restrictions Precautions Precautions: Back Precaution Booklet Issued: Yes (comment) Recall of Precautions/Restrictions: Intact Precaution/Restrictions Comments: Patient able to maintain precautions well throughout session without cues. Restrictions Weight Bearing Restrictions Per Provider Order: No LLE Weight Bearing Per Provider Order: Weight bearing as tolerated       Mobility Bed Mobility               General bed mobility comments: up in recliner  start and end of session    Transfers Overall transfer level: Needs assistance Equipment used: Rolling walker (2 wheels) Transfers: Sit to/from Stand Sit to Stand: Supervision     Step pivot transfers: Supervision           Balance Overall balance assessment: Needs assistance Sitting-balance support: Feet supported, No upper extremity supported Sitting balance-Leahy Scale: Normal     Standing balance support: Bilateral upper extremity supported, During functional activity, Reliant on assistive device for balance Standing balance-Leahy Scale: Good Standing balance comment: steady with RW                           ADL either performed or assessed with clinical judgement   ADL Overall ADL's : Needs assistance/impaired     Grooming: Wash/dry hands;Standing Grooming Details (indicate cue type and reason): standing at sink                 Toilet Transfer: Supervision/safety;BSC/3in1;Rolling walker (2 wheels);Cueing for safety;Ambulation Toilet Transfer Details (indicate cue type and reason): BSC over reg toilet, pt with good use of RW, able to navigate envionrment safely. Assist only for IV pole and drain/line management Toileting- Clothing Manipulation and Hygiene: Sitting/lateral lean;Adhering to back precautions;Supervision/safety Toileting - Clothing Manipulation Details (indicate cue type and reason): good adherence to precautions     Functional mobility during ADLs: Supervision/safety;Rolling walker (2 wheels) General ADL Comments: Pt making good progress towards goals, no LOB throughout     Communication Communication Communication: No apparent difficulties   Cognition Arousal: Alert Behavior During Therapy: WFL for tasks assessed/performed Cognition: No apparent impairments                               Following commands:  Intact        Cueing   Cueing Techniques: Verbal cues  Exercises   Shoulder Instructions   General  Comments Notified RN of slight drain leakage (pt said RN was aware but unsure of which on, OT sent secure chat message), and pt reporting intermittent tingling/numbness that resolved when walking (possibly due to legs "falling asleep"    Pertinent Vitals/ Pain       Pain Assessment Pain Assessment: 0-10 Pain Score: 8  Pain Location: Mid to Upper Back Pain Descriptors / Indicators: Sore, Tender Pain Intervention(s): Limited activity within patient's tolerance, Monitored during session   Frequency  Min 1X/week        Progress Toward Goals  OT Goals(current goals can now be found in the care plan section)  Progress towards OT goals: Progressing toward goals  Acute Rehab OT Goals OT Goal Formulation: With patient Time For Goal Achievement: 12/10/23 Potential to Achieve Goals: Good ADL Goals Pt Will Perform Grooming: with modified independence;standing Pt Will Perform Lower Body Bathing: with modified independence;sitting/lateral leans;with adaptive equipment Pt Will Perform Lower Body Dressing: with modified independence;sit to/from stand;sitting/lateral leans;with adaptive equipment Pt Will Transfer to Toilet: with modified independence;ambulating;bedside commode Pt Will Perform Toileting - Clothing Manipulation and hygiene: with modified independence;sitting/lateral leans;sit to/from stand;with adaptive equipment  Plan      Co-evaluation        PT goals addressed during session: Mobility/safety with mobility;Balance;Proper use of DME;Strengthening/ROM        AM-PAC OT "6 Clicks" Daily Activity     Outcome Measure   Help from another person eating meals?: None Help from another person taking care of personal grooming?: None Help from another person toileting, which includes using toliet, bedpan, or urinal?: A Little Help from another person bathing (including washing, rinsing, drying)?: A Little Help from another person to put on and taking off regular upper body  clothing?: None Help from another person to put on and taking off regular lower body clothing?: A Little 6 Click Score: 21    End of Session Equipment Utilized During Treatment: Rolling walker (2 wheels)  OT Visit Diagnosis: Pain;Other abnormalities of gait and mobility (R26.89)   Activity Tolerance Patient tolerated treatment well   Patient Left in chair;with call bell/phone within reach;with chair alarm set   Nurse Communication Mobility status        Time: 1146-1200 OT Time Calculation (min): 14 min  Charges: OT General Charges $OT Visit: 1 Visit OT Treatments $Self Care/Home Management : 8-22 mins  Marek Nghiem L. Sergio Zawislak, OTR/L  11/27/23, 1:00 PM

## 2023-11-27 NOTE — Plan of Care (Signed)
Problem: Nutrition: Goal: Adequate nutrition will be maintained Outcome: Progressing

## 2023-11-27 NOTE — Progress Notes (Signed)
Date of Admission:  11/23/2023    ID: Danielle Harrison is a 48 y.o. female  Principal Problem:   Osteomyelitis of thoracic spine (HCC) Active Problems:   Asthma   Septic arthritis of left foot (HCC)   HTN (hypertension)   Hyperkalemia   Epidural abscess   Acute osteomyelitis of thoracic spine (HCC)   Pathologic fracture of thoracic vertebrae, initial encounter   Mom at bed side Subjective: Doing better No specific complaints  Medications:   amLODipine  10 mg Oral Daily   baclofen  10 mg Oral Daily   Chlorhexidine Gluconate Cloth  6 each Topical Daily   docusate sodium  100 mg Oral BID   enoxaparin (LOVENOX) injection  40 mg Subcutaneous Q24H   gabapentin  100 mg Oral TID   methocarbamol  500 mg Oral Q6H   Or   methocarbamol (ROBAXIN) injection  500 mg Intravenous Q6H   metoprolol tartrate  100 mg Oral BID   multivitamin with minerals  1 tablet Oral Daily   rifampin  300 mg Oral Q12H   senna  1 tablet Oral BID   sodium chloride flush  10-40 mL Intracatheter Q12H   sodium chloride flush  3 mL Intravenous Q12H   sodium zirconium cyclosilicate  10 g Oral Once    Objective: Vital signs in last 24 hours: Patient Vitals for the past 24 hrs:  BP Temp Pulse Resp SpO2  11/27/23 1522 124/88 98.5 F (36.9 C) 91 17 100 %  11/27/23 0723 124/81 98.8 F (37.1 C) 99 16 100 %  11/26/23 2207 (!) 147/97 -- (!) 110 -- --  11/26/23 2206 (!) 147/97 98.8 F (37.1 C) (!) 110 18 100 %     LDA Rt PICC  PHYSICAL EXAM:  General: Alert, cooperative, no distress, appears stated age.  Back: surgical dressing over thoracic spine-  Lungs: b/l air entry Heart: s1s2 Abdomen: Soft, non-tender,not distended. Bowel sounds normal. No masses Extremities: atraumatic, no cyanosis. No edema. No clubbing Skin: No rashes or lesions. Or bruising Lymph: Cervical, supraclavicular normal. Neurologic: Grossly non-focal  Lab Results    Latest Ref Rng & Units 11/27/2023    4:52 AM 11/25/2023    8:06  AM 11/24/2023    5:59 AM  CBC  WBC 4.0 - 10.5 K/uL 13.9  10.4  10.9   Hemoglobin 12.0 - 15.0 g/dL 9.1  96.0  45.4   Hematocrit 36.0 - 46.0 % 27.5  31.2  31.1   Platelets 150 - 400 K/uL 471  548  680        Latest Ref Rng & Units 11/27/2023    4:52 AM 11/25/2023    8:06 AM 11/25/2023    5:39 AM  CMP  Glucose 70 - 99 mg/dL  098    BUN 6 - 20 mg/dL  15    Creatinine 1.19 - 1.00 mg/dL 1.47  8.29  5.62   Sodium 135 - 145 mmol/L  130    Potassium 3.5 - 5.1 mmol/L  4.0    Chloride 98 - 111 mmol/L  101    CO2 22 - 32 mmol/L  20    Calcium 8.9 - 10.3 mg/dL  9.2        Microbiology: 2/9 BC ng Disc space - rare staph Epidural space nogrowth so far      Assessment/Plan: MRSA bacteremia in Jan 2025 with T6-T7 discitis  and left great toe septic arthritis and osteo She has been on appropriate antibiotic since last  admission and it is now 23 days of Iv daptomycin and there is progression of the vertebral osteomyelitis and now with epidural abscess and nerve compression causing severe pain. She underwent surgery on 11/25/23 1. Posterior Segmental Instrumentation T4 to T9 2. Posterolateral arthrodesis from T4 to T9 3. Thoracic lamina to me at T6 and T7 4.  Right sided transpedicular disc debridement and washout of ventral epidural abscess 5.  Autograft harvest spinous processes 6.  Allograft placement  Culture sent 1 has come positive for staph aureus- sensi pending She is on vancomycin Dose increased as levels not in the range Pt will need 6- 12 weeks of antibiotic post surgery depending on culture results  She is going to need rifampin  along with vanco  Sed rate and crp have improved from last month Anemia   Thrombocytosis due to infection   Left great toe infeciton with septic arthritis and osteo of the Met- s/p resection- site has healed well Sutures will have to be removed   H/o pancreatitis- had small pseudocysts in CT from 1/14 She is now totally asymptomatic May repeat  CT to look at the size of the pseudocyst?/  Discussed the management with patient and hospitalist OPAT will be written once susceptibility is available

## 2023-11-27 NOTE — Progress Notes (Signed)
Physical Therapy Treatment Patient Details Name: Danielle Harrison MRN: [id number] DOB: 06-07-1976 Today's Date: 11/27/2023   History of Present Illness Danielle Harrison is a 48 y.o. 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 Patient underwent surgery on 12/11 d/t thoracic osteomyelitis, discitis, epidural abscess. Patient is s/p T4-9 Post Spinal Fusion, T6-7 thoracic laminectomy, right sided transpedicular decompression and disc debridement.    PT Comments  Pt was supine with HOB elevated ~ 20 degrees upon arrival. She is A and O x 4. Agreeable to session and was premedicated for pain ~ 30 minutes prior.Pt was easily and safely able to roll L to short sit. No difficulty or assistance required. Pt endorses severe 10/10 pain however pain did not limit session progression. Pt was able to stand and ambulate > 300 ft with RW without LOB or safety concern. Pt endorses feeling eager to go home. DC recs remain appropriate to maximize independence and safety with all ADLs.      If plan is discharge home, recommend the following: A little help with walking and/or transfers;Assist for transportation;Help with stairs or ramp for entrance     Equipment Recommendations  None recommended by PT       Precautions / Restrictions Precautions Precautions: Back Precaution Booklet Issued: Yes (comment) Recall of Precautions/Restrictions: Intact Precaution/Restrictions Comments: Patient able to maintain precautions well throughout session without cues. Required Braces or Orthoses:  (no brace) Restrictions Weight Bearing Restrictions Per Provider Order: No LLE Weight Bearing Per Provider Order: Weight bearing as tolerated     Mobility  Bed Mobility Overal bed  mobility: Modified Independent Bed Mobility: Rolling, Sidelying to Sit, Supine to Sit Rolling: Modified independent (Device/Increase time)  General bed mobility comments: no physical assistance required to exit L side of bed. reviewed spinal precautions and log roll for technique/sequencing    Transfers Overall transfer level: Needs assistance Equipment used: Rolling walker (2 wheels) Transfers: Sit to/from Stand Sit to Stand: Supervision  General transfer comment: pt physical assistance to stand from lowest bed height    Ambulation/Gait Ambulation/Gait assistance: Supervision Gait Distance (Feet): 400 Feet Assistive device: Rolling walker (2 wheels) Gait Pattern/deviations: WFL(Within Functional Limits) Gait velocity: WNL  General Gait Details: no LOB or safety concern during ambulation with use of RW.   Stairs Stairs: Yes  General stair comments: pt states she has stairs to get to 2nd story of home but is planning to stay on first level until she is able to safely ascend/descend stairs.    Balance Overall balance assessment: Needs assistance Sitting-balance support: Feet supported, No upper extremity supported Sitting balance-Leahy Scale: Normal  Standing balance support: Bilateral upper extremity supported, During functional activity, Reliant on assistive device for balance Standing balance-Leahy Scale: Good Standing balance comment: steady with RW       Communication Communication Communication: No apparent difficulties  Cognition Arousal: Alert Behavior During Therapy: WFL for tasks assessed/performed   PT - Cognitive impairments: No family/caregiver present to determine baseline    PT - Cognition Comments: Pt is alert and oriented Following commands: Intact                Pertinent Vitals/Pain Pain Assessment Pain Assessment: 0-10 Pain Score: 10-Worst pain ever Pain Location: Mid to Upper Back Pain Descriptors / Indicators: Sore, Tender Pain  Intervention(s): Limited activity within  patient's tolerance, Monitored during session, Premedicated before session, Repositioned     PT Goals (current goals can now be found in the care plan section) Acute Rehab PT Goals Patient Stated Goal: go home Progress towards PT goals: Progressing toward goals    Frequency    Min 1X/week       Co-evaluation     PT goals addressed during session: Mobility/safety with mobility;Balance;Proper use of DME;Strengthening/ROM        AM-PAC PT "6 Clicks" Mobility   Outcome Measure  Help needed turning from your back to your side while in a flat bed without using bedrails?: A Little Help needed moving from lying on your back to sitting on the side of a flat bed without using bedrails?: A Little Help needed moving to and from a bed to a chair (including a wheelchair)?: A Little Help needed standing up from a chair using your arms (e.g., wheelchair or bedside chair)?: A Little Help needed to walk in hospital room?: A Little Help needed climbing 3-5 steps with a railing? : A Little 6 Click Score: 18    End of Session   Activity Tolerance: Patient tolerated treatment well Patient left: in chair;with chair alarm set;with call bell/phone within reach Nurse Communication: Mobility status PT Visit Diagnosis: Unsteadiness on feet (R26.81);Pain;Other abnormalities of gait and mobility (R26.89)     Time: 1610-9604 PT Time Calculation (min) (ACUTE ONLY): 10 min  Charges:    $Gait Training: 8-22 mins PT General Charges $$ ACUTE PT VISIT: 1 Visit                     Jetta Lout PTA 11/27/23, 12:03 PM

## 2023-11-27 NOTE — Progress Notes (Signed)
Pharmacy Antibiotic Note  Danielle Harrison is a 48 y.o. female admitted on 11/23/2023 with  recent history of MRSA bacteremia, foot infection, and osteomyelitis of T-spine on daptomycin 500mg  IV q24h (end date 12/10/23), but worsening now .  Pharmacy has been consulted for Vancomycin and Cefepime dosing.  Today, 11/27/2023 Day #4 vancomycin and cefepime. Daptomycin prior to admission  Renal: SCr WNL WBC 13.9 Afebrile 2/9 MRI: progressive discitis T6-7 and epidural abscess 2/9 blood cultures: NGTD Daptomycin MIC from previous admit = 0.5 mcg/ml 2/11 OR - I&D of significant amount of phlegmon and small pockets of epidural abscesses 2/11 OR cultures: NGTD  Vancomycin levels: Vancomycin dose 750mg  IV q12h (previous dose given 2/12 at 18:01 (with 5th dose)  Vancomycin peak 2/11 at 21:22 = 18 mcg/ml Vancomycin trough 2/12 at 04:52 = 8 mcg/mL AUC = 302, Cmax 20 mcg/ml and Cmin 7.2 mcg/mL  Plan: Adjust vancomycin to 1250mg  IV q12h.  As patient received 6am dose of vancomycin 750mg  IV, will give additional vancomycin 500mg  IV x 1 now AUC = 504, Cmin 12.5 mcg/mL Goal AUC 400-600, prefer to have trough ~15 mcg/mL Monitor renal function Check steady state levels with new dosing regimen to evaluate further need to adjust dose Await cultures   Height: 5' (152.4 cm) Weight: 49.9 kg (110 lb) IBW/kg (Calculated) : 45.5  Temp (24hrs), Avg:98.6 F (37 C), Min:98.2 F (36.8 C), Max:98.8 F (37.1 C)  Recent Labs  Lab 11/23/23 1549 11/23/23 1725 11/23/23 2136 11/24/23 0559 11/25/23 0539 11/25/23 0806 11/26/23 2122 11/27/23 0452  WBC 13.1*  --   --  10.9*  --  10.4  --  13.9*  CREATININE 0.56  --   --  0.49 0.56 0.41*  --  0.42*  LATICACIDVEN  --  3.4* 2.1*  --   --   --   --   --   VANCOTROUGH  --   --   --   --   --   --   --  8*  VANCOPEAK  --   --   --   --   --   --  16*  --     Estimated Creatinine Clearance: 62.4 mL/min (A) (by C-G formula based on SCr of 0.42 mg/dL (L)).     Allergies  Allergen Reactions   Diclofenac Hives    Antimicrobials this admission: Cefepime 2/9 >> 2/10 Vancomycin 2/9 >>  On daptomycin prior to admit  Thank you for allowing pharmacy to be a part of this patient's care.  Juliette Alcide, PharmD, BCPS, BCIDP Work Cell: [phone] 11/27/2023 7:43 AM

## 2023-11-27 NOTE — Progress Notes (Addendum)
PROGRESS NOTE    Danielle Harrison  EXH:[id number] DOB: 03/24/1976 DOA: 11/23/2023 PCP: [name], NP    Brief Narrative:   48 y.o. 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 Pt was discharged on daptomycin given through PICC line in right arm. Pt states that her left great toe pain has significantly improved.  No more abdominal pain, nausea vomiting or diarrhea.     She continues to have muscle spasm and increasing pain across her whole front chest. Also has middle back pain.  Her chest pain is sharp, severe, pleuritic, aggravated by deep breath.  Associated with shortness of breath.  No cough, fever or chills.  No loss control of bladder or bowel movement.  Denies symptoms of UTI.  Patient states that she did not drink alcohol since last admission.  Patient denies drug use.   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 Drain removed today, has wound vac as well - pt/ot  consulted, advising home health - neurosurg f/u next week - ID following, continuing vanc and rifampin  Asthma: Stable -As needed Mucinex, bronchodilators    HTN (hypertension) Bp wnl -IV hydralazine as needed - home Metoprolol - have added amlodipine   Hyperkalemia:  Resolved - monitor   Recent septic arthritis of left foot [facility]): s/p of washout.  Surgical site is healing well -On IV vancomycin as above - podiatry ok with d/c of sutures which they have performed - [facility]ord advises 2 wk f/u   DVT prophylaxis: lovenox Code Status: Full Family Communication:None Disposition Plan: Status is: Inpatient Remains inpatient appropriate because: ID still determining outpt opat plan   Level of care: Med-Surg  Consultants:  Neurosurgery IR ID  Procedures:  Spine surgery 2/11  Antimicrobials: Vancomycin   Subjective: Seen and examined.  Sitting up in bed.  Pain stable. Tolerating diet. BM today after lactulose  Objective: Vitals:   11/26/23 2206 11/26/23 2207 11/27/23 0723 11/27/23 1522  BP: (!) 147/97 (!) 147/97 124/81 124/88  Pulse: (!) 110 (!) 110 99 91  Resp: 18  16 17   Temp: 98.8 F (37.1 C)  98.8 F (37.1 C) 98.5 F (36.9 C)  TempSrc:      SpO2: 100%  100% 100%  Weight:      Height:        Intake/Output Summary (Last 24 hours) at 11/27/2023 1635 Last data filed at 11/27/2023 0915 Gross per 24 hour  Intake --  Output 115 ml  Net -115 ml   Filed Weights   11/23/23 1544 11/25/23 1104  Weight: 49.9 kg 49.9 kg    Examination:  General exam:  No acute distress Respiratory system: Lungs clear.  Normal breathing.  Room air Cardiovascular system: S1-S2, RRR, no murmurs, no pedal edema Gastrointestinal system: Soft, NT/ND, normal bowel sounds Central nervous system: Alert and oriented. No focal neurological deficits. Extremities: warm, moving both   Skin: wound vac over midline incision mid back Psychiatry: Judgement and insight appear normal. Mood &  affect appropriate.     Data Reviewed: I have personally reviewed following labs and imaging studies  CBC: Recent Labs  Lab 11/23/23 1549 11/24/23 0559 11/25/23 0806 11/27/23 0452  WBC 13.1* 10.9* 10.4 13.9*  NEUTROABS  --   --  6.3  --   HGB 11.1* 10.0* 10.1* 9.1*  HCT 34.7* 31.1* 31.2* 27.5*  MCV 95.6 95.4 95.7 92.0  PLT 730* 680* 548* 471*   Basic Metabolic Panel: Recent Labs  Lab 11/23/23 1549 11/24/23 0559 11/25/23 0539 11/25/23 0806 11/27/23 0452  NA 133* 135  --  130*  --   K 5.6* 4.0  --  4.0  --   CL 103 107  --  101  --   CO2 19* 20*  --  20*  --   GLUCOSE 176* 104*  --  161*  --   BUN 20 15  --  15  --   CREATININE 0.56 0.49 0.56 0.41* 0.42*  CALCIUM 10.0 9.2  --  9.2  --    GFR: Estimated Creatinine Clearance: 62.4 mL/min (A) (by C-G formula based on SCr of 0.42 mg/dL (L)). Liver Function Tests: Recent Labs  Lab 11/23/23 1549  AST 25  ALT 23  ALKPHOS 105  BILITOT 0.7  PROT 8.6*  ALBUMIN 4.0   Recent Labs  Lab 11/23/23 1549  LIPASE 21   No results for input(s): "AMMONIA" in the last 168 hours. Coagulation Profile: Recent Labs  Lab 11/23/23 2155  INR 1.1   Cardiac Enzymes: No results for input(s): "CKTOTAL", "CKMB", "CKMBINDEX", "TROPONINI" in the last 168 hours. BNP (last 3 results) No results for input(s): "PROBNP" in the last 8760 hours. HbA1C: No results for input(s): "HGBA1C" in the last 72 hours. CBG: Recent Labs  Lab 11/25/23 1112  GLUCAP 126*   Lipid Profile: No results for input(s): "CHOL", "HDL", "LDLCALC", "TRIG", "CHOLHDL", "LDLDIRECT" in the last 72 hours. Thyroid Function Tests: No results for input(s): "TSH", "T4TOTAL", "FREET4", "T3FREE", "THYROIDAB" in the last 72 hours. Anemia Panel: No results for input(s): "VITAMINB12", "FOLATE", "FERRITIN", "TIBC", "IRON", "RETICCTPCT" in the last 72 hours. Sepsis Labs: Recent Labs  Lab 11/23/23 1549 11/23/23 1725 11/23/23 2136  PROCALCITON <0.10  --   --    LATICACIDVEN  --  3.4* 2.1*    Recent Results (from the past 240 hours)  Culture, blood (routine x 2)     Status: None (Preliminary result)   Collection Time: 11/23/23  5:29 PM   Specimen: BLOOD  Result Value Ref Range Status   Specimen Description BLOOD BLOOD LEFT FOREARM  Final   Special Requests   Final    BOTTLES DRAWN AEROBIC AND ANAEROBIC Blood Culture results may not be optimal due to an excessive volume of blood received in culture bottles   Culture   Final    NO GROWTH 4 DAYS Performed at [facility], [address] [place], [place] [address]    Report Status PENDING  Incomplete  Culture, blood (routine x 2)     Status: None (Preliminary result)   Collection Time: 11/23/23  5:29 PM   Specimen: BLOOD  Result Value Ref  Range Status   Specimen Description BLOOD BLOOD RIGHT ARM  Final   Special Requests   Final    BOTTLES DRAWN AEROBIC AND ANAEROBIC Blood Culture results may not be optimal due to an inadequate volume of blood received in culture bottles   Culture   Final    NO GROWTH 4 DAYS Performed at [facility], [address], [place], [place] [address]    Report Status PENDING  Incomplete  Aerobic/Anaerobic Culture w Gram Stain (surgical/deep wound)     Status: None (Preliminary result)   Collection Time: 11/25/23  2:44 PM   Specimen: PATH Soft tissue  Result Value Ref Range Status   Specimen Description WOUND  Final   Special Requests EPIDURAL SPACE  Final   Gram Stain NO WBC SEEN NO ORGANISMS SEEN   Final   Culture   Final    NO GROWTH 2 DAYS NO ANAEROBES ISOLATED; CULTURE IN PROGRESS FOR 5 DAYS Performed at [facility], [address] [place], [place] [address]    Report Status PENDING  Incomplete  Aerobic/Anaerobic Culture w Gram Stain (surgical/deep wound)     Status: None (Preliminary result)   Collection Time: 11/25/23  2:51 PM   Specimen: PATH Soft tissue  Result Value Ref Range Status   Specimen Description WOUND   Final   Special Requests DISC SPACE  Final   Gram Stain   Final    RARE WBC PRESENT, PREDOMINANTLY PMN NO ORGANISMS SEEN    Culture   Final    RARE STAPHYLOCOCCUS AUREUS SUSCEPTIBILITIES TO FOLLOW Performed at [facility], [address] [place], [place] [address]    Report Status PENDING  Incomplete         Radiology Studies: No results found.       Scheduled Meds:  amLODipine  10 mg Oral Daily   baclofen  10 mg Oral Daily   Chlorhexidine Gluconate Cloth  6 each Topical Daily   docusate sodium  100 mg Oral BID   enoxaparin (LOVENOX) injection  40 mg Subcutaneous Q24H   gabapentin  100 mg Oral TID   methocarbamol  500 mg Oral Q6H   Or   methocarbamol (ROBAXIN) injection  500 mg Intravenous Q6H   metoprolol tartrate  100 mg Oral BID   multivitamin with minerals  1 tablet Oral Daily   rifampin  300 mg Oral Q12H   senna  1 tablet Oral BID   sodium chloride flush  10-40 mL Intracatheter Q12H   sodium chloride flush  3 mL Intravenous Q12H   sodium zirconium cyclosilicate  10 g Oral Once   Continuous Infusions:  vancomycin       LOS: 4 days      Silvano Bilis, MD [facility]   If 7PM-7AM, please contact night-coverage  11/27/2023, 4:35 PM

## 2023-11-27 NOTE — Anesthesia Postprocedure Evaluation (Signed)
Anesthesia Post Note  Patient: Danielle Harrison  Procedure(s) Performed: T4-T9 Posterior Spinal Fusion. (Bilateral: Spine Thoracic) T6-T7 thoracic laminectomy, right sided transpedicular decompression and disc debridement (Spine Thoracic) APPLICATION OF INTRAOPERATIVE CT SCAN (Spine Thoracic)  Patient location during evaluation: PACU Anesthesia Type: General Level of consciousness: awake and alert Pain management: pain level controlled Vital Signs Assessment: post-procedure vital signs reviewed and stable Respiratory status: spontaneous breathing, nonlabored ventilation and respiratory function stable Cardiovascular status: blood pressure returned to baseline and stable Postop Assessment: no apparent nausea or vomiting Anesthetic complications: no   No notable events documented.   Last Vitals:  Vitals:   11/26/23 2206 11/26/23 2207  BP: (!) 147/97 (!) 147/97  Pulse: (!) 110 (!) 110  Resp: 18   Temp: 37.1 C   SpO2: 100%     Last Pain:  Vitals:   11/27/23 0627  TempSrc:   PainSc: 8                  Foye Deer

## 2023-11-27 NOTE — Progress Notes (Signed)
   Neurosurgery Progress Note  History: Danielle Harrison is s/p T4-9 PSF. T6-7 thoracic laminectomy.   POD2: POD2: Robaxin working well. Overall pain is well controlled. POD1: expected back pain overnight. She denies any leg pain   Physical Exam: Vitals:   11/26/23 2207 11/27/23 0723  BP: (!) 147/97 124/81  Pulse: (!) 110 99  Resp:  16  Temp:  98.8 F (37.1 C)  SpO2:  100%    AA Ox3 CNI  Strength:5/5 throughout BLE  HV 15 Incision covered with incisional wound vac  Data:  Other tests/results:  Surgical path pending   Assessment/Plan:  Danielle Harrison is a 48 y.o presenting with epidural abscess s/p thoracic decompression and fusion.   - mobilize - pain control - ok for DVT prophylaxis from neurosurgical standpoint  - PTOT as tolerated. No need for brace - continue wound vac -Hemovac drain removed - abx per ID  - remainder of care per IM  Joan Flores PA-C Department of Neurosurgery

## 2023-11-28 ENCOUNTER — Other Ambulatory Visit: Payer: Self-pay

## 2023-11-28 ENCOUNTER — Ambulatory Visit: Payer: Medicaid Other | Admitting: Cardiology

## 2023-11-28 DIAGNOSIS — M00072 Staphylococcal arthritis, left ankle and foot: Secondary | ICD-10-CM | POA: Diagnosis not present

## 2023-11-28 DIAGNOSIS — M4624 Osteomyelitis of vertebra, thoracic region: Secondary | ICD-10-CM | POA: Diagnosis not present

## 2023-11-28 DIAGNOSIS — B9562 Methicillin resistant Staphylococcus aureus infection as the cause of diseases classified elsewhere: Secondary | ICD-10-CM | POA: Diagnosis not present

## 2023-11-28 DIAGNOSIS — M86172 Other acute osteomyelitis, left ankle and foot: Secondary | ICD-10-CM | POA: Diagnosis not present

## 2023-11-28 DIAGNOSIS — R7881 Bacteremia: Secondary | ICD-10-CM | POA: Diagnosis not present

## 2023-11-28 LAB — CULTURE, BLOOD (ROUTINE X 2)
Culture: NO GROWTH
Culture: NO GROWTH

## 2023-11-28 MED ORDER — RIFAMPIN 300 MG PO CAPS
300.0000 mg | ORAL_CAPSULE | Freq: Two times a day (BID) | ORAL | 0 refills | Status: DC
Start: 2023-11-28 — End: 2024-01-21
  Filled 2023-11-28: qty 60, 30d supply, fill #0
  Filled 2023-12-09: qty 44, 22d supply, fill #1

## 2023-11-28 MED ORDER — VANCOMYCIN IV (FOR PTA / DISCHARGE USE ONLY)
1250.0000 mg | Freq: Two times a day (BID) | INTRAVENOUS | 0 refills | Status: DC
Start: 2023-11-28 — End: 2024-01-21

## 2023-11-28 MED ORDER — OXYCODONE HCL 5 MG PO TABS
5.0000 mg | ORAL_TABLET | Freq: Four times a day (QID) | ORAL | 0 refills | Status: DC | PRN
  Filled 2023-11-28: qty 20, 5d supply, fill #0
  Filled 2023-12-01: qty 10, 3d supply, fill #1
  Filled 2023-12-03: qty 20, 5d supply, fill #1

## 2023-11-28 NOTE — Discharge Summary (Signed)
Danielle Harrison:[id number] DOB: 03-25-1976 DOA: 11/23/2023  PCP: [name], NP  Admit date: 11/23/2023 Discharge date: 11/28/2023  Time spent: 35 minutes  Recommendations for Outpatient Follow-up:  Neurosurgery f/u 2/17 ID f/u as scheduled PCP f/u  Podiatry f/u 2 weeks    Discharge Diagnoses:  Principal Problem:   Osteomyelitis of thoracic spine (HCC) Active Problems:   Epidural abscess   Asthma   HTN (hypertension)   Hyperkalemia   Septic arthritis of left foot (HCC)   Acute osteomyelitis of thoracic spine (HCC)   Pathologic fracture of thoracic vertebrae, initial encounter   MRSA infection   Discharge Condition: stable  Diet recommendation: heart healthy  Filed Weights   11/23/23 1544 11/25/23 1104  Weight: 49.9 kg 49.9 kg    History of present illness:  Danielle Harrison is a 48 y.o. 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 Pt was discharged on daptomycin given through PICC line in right arm. Pt states that her left great toe pain has significantly improved.  No more abdominal pain, nausea vomiting or diarrhea.     She continues to have muscle spasm and increasing pain across her whole front chest. Also has middle back pain.  Her chest pain is sharp, severe, pleuritic, aggravated by deep breath.  Associated with shortness of breath.  No cough, fever or  chills.  No loss control of bladder or bowel movement.  Denies symptoms of UTI.  Patient states that she did not drink alcohol since last admission.  Patient denies drug use.  Hospital Course:   Recent admission for mrsa bacteremia with thoracic osteo and left MPJ osteo (s/p resection) presenting with worsening back pain, found to have thoracic epidural abscess with thoracic discitis/osteomyelitis. Neurosurgery performed arthrodesis, debridement, and allograft placement on 2/11. Drain has been pulled, has wound vac in place and will f/u with them on 2/17. ID followed, plan for extended course of antibiotics (rifampin and vancomycin). Has picc. Podiatry evaluated and removed sutures in foot, plan will be f/u with them in 2 weeks (healing well).   Procedures: See above   Consultations: Neurosurgery, ID, podiatry  Discharge Exam: Vitals:   11/27/23 2113 11/28/23 0812  BP: 124/85 (!) 133/95  Pulse: (!) 106 99  Resp:  18  Temp:  98.5 F (36.9 C)  SpO2:  100%    General exam: No acute distress Respiratory system: Lungs clear.  Normal breathing.  Room air Cardiovascular system: S1-S2, RRR, no murmurs, no pedal edema Gastrointestinal system: Soft, NT/ND, normal bowel sounds Central nervous system: Alert and oriented. No focal neurological deficits. Extremities: warm, moving both   Skin: wound vac over midline incision mid back Psychiatry: Judgement and insight appear normal. Mood & affect appropriate.   Discharge Instructions   Discharge Instructions     [facility] pharmacist to adjust dose for Vancomycin, Aminoglycosides and other anti-infective therapies as requested by physician.   Complete by: As directed    [facility] to provide Cath Flo 2mg   Complete by: As directed    Administer for PICC line occlusion and as ordered by physician for other access device issues.   Anaphylaxis Kit: Provided to treat any anaphylactic reaction to the medication being  provided to the patient if First Dose or when requested by physician   Complete by: As directed    Epinephrine 1mg /ml vial / amp: Administer 0.3mg  (0.78ml) subcutaneously once for moderate to severe anaphylaxis, nurse to call physician and pharmacy when reaction occurs and call 911 if needed for immediate care   Diphenhydramine 50mg /ml IV vial: Administer 25-50mg  IV/IM PRN for first dose reaction, rash, itching, mild reaction, nurse to call physician and pharmacy when reaction occurs   Sodium Chloride 0.9% NS IV: Administer if needed for hypovolemic blood pressure drop or as ordered by physician after call to physician with anaphylactic reaction   Change dressing on IV access line weekly and PRN   Complete by: As directed    Diet - low sodium heart healthy   Complete by: As directed    Flush IV access with Sodium Chloride 0.9% and Heparin 10 units/ml or 100 units/ml   Complete by: As directed    Home infusion instructions - [facility]   Complete by: As directed    Instructions: Flush IV access with Sodium Chloride 0.9% and Heparin 10units/ml or 100units/ml   Change dressing on IV access line: Weekly and PRN   Instructions Cath Flo 2mg : Administer for PICC Line occlusion and as ordered by physician for other access device   [facility] pharmacist to adjust dose for: Vancomycin, Aminoglycosides and other anti-infective therapies as requested by physician   Incentive spirometry RT   Complete by: As directed    Increase activity slowly   Complete by: As directed    Leave dressing on - Keep it clean, dry, and intact until clinic visit   Complete by: As directed    Method of administration may be changed at the discretion of home infusion pharmacist based upon assessment of the patient and/or caregiver's ability to self-administer the medication ordered   Complete by: As directed       Allergies as of 11/28/2023       Reactions   Diclofenac Hives         Medication List     STOP taking these medications    acetaminophen 325 MG tablet Commonly known as: TYLENOL   daptomycin IVPB Commonly known as: CUBICIN   predniSONE 10 MG (21) Tbpk tablet Commonly known as: STERAPRED UNI-PAK 21 TAB       TAKE these medications    ascorbic acid 500 MG tablet Commonly known as: VITAMIN C Take 1 tablet (500 mg total) by mouth daily.   baclofen 10 MG tablet Commonly known as: LIORESAL Take 1 tablet (10 mg total) by mouth daily.   bisacodyl 5 MG EC tablet Generic drug: bisacodyl Take 2 tablets (10 mg total) by mouth at bedtime as needed for moderate constipation.   CertaVite/Antioxidants Tabs Take 1 tablet by mouth daily.   chlorhexidine 4 % external liquid Commonly known as: HIBICLENS Apply 15 mLs (1 Application total) topically as directed for 30 doses. Use as directed daily for 5 days every other week for 6 weeks.   gabapentin 100 MG capsule Commonly known as: NEURONTIN Take 1 capsule (100 mg total) by mouth 3 (three) times daily.   metFORMIN 500 MG tablet Commonly known as: GLUCOPHAGE Take 1 tablet (500 mg total) by mouth 2 (two)  times daily with a meal.   metoprolol tartrate 100 MG tablet Commonly known as: LOPRESSOR Take 1 tablet (100 mg total) by mouth 2 (two) times daily.   mupirocin ointment 2 % Commonly known as: BACTROBAN Place 1 Application into the nose 2 (two) times daily for 60 doses. Use as directed 2 times daily for 5 days every other week for 6 weeks.   oxyCODONE 5 MG immediate release tablet Commonly known as: Oxy IR/ROXICODONE Take 1 tablet (5 mg total) by mouth every 6 (six) hours as needed for severe pain (pain score 7-10).   polyethylene glycol powder 17 GM/SCOOP powder Commonly known as: GLYCOLAX/MIRALAX Take 17 g by mouth daily. Mix as directed.   rifampin 300 MG capsule Commonly known as: RIFADIN Take 1 capsule (300 mg total) by mouth every 12 (twelve) hours.   vancomycin IVPB Inject 1,250 mg  into the vein every 12 (twelve) hours. Indication:  Worsening MRSA lumbar discitis and epidural abscess (previously on daptomycin) First Dose: Yes Last Day of Therapy:  01/19/2024 Labs - "Sunday/Monday:  CBC/D, CMP, ESR, CRP and vancomycin trough. Labs - Thursday:  BMP and vancomycin trough Goal Vancmycin trough 18-20 mcg/mL Fax weekly lab results  promptly to [phone] Method of administration:Elastomeric Method of administration may be changed at the discretion of the patient and/or caregiver's ability to self-administer the medication ordered. Please pull PIC at completion of IV antibiotics Call [phone] with critical value or questions               Discharge Care Instructions  (From admission, onward)           Start     Ordered   11/28/23 0000  Change dressing on IV access line weekly and PRN  (Home infusion instructions - [facility] )        11/28/23 1315   11/28/23 0000  Leave dressing on - Keep it clean, dry, and intact until clinic visit        02" /14/25 1315           Allergies  Allergen Reactions   Diclofenac Hives    Follow-up Information     Joan Flores, PA-C Follow up on 12/01/2023.   Specialty: Physician Assistant Why: 2:30pm. Follow up for wound vac removal Contact information: [address], [address] [place] [address] [phone]         [name], NP Follow up.   Specialty: Cardiology Contact information: [address] [place] [address] [phone]                  The results of significant diagnostics from this hospitalization (including imaging, microbiology, ancillary and laboratory) are listed below for reference.    Significant Diagnostic Studies: DG Thoracic Spine 2 View Result Date: 11/25/2023 CLINICAL DATA:  Elective surgery. EXAM: THORACIC SPINE 2 VIEWS COMPARISON:  Preoperative MRI. FINDINGS: Seven fluoroscopic spot views of the thoracic spine submitted from the  operating room. Surgical instruments localize over the midthoracic spine with multilevel pedicle screw fixation. Fluoroscopy time 1 minutes. Dose 54.52 mGy. IMPRESSION: Procedural fluoroscopy during thoracic spine surgery. Electronically Signed   By: Narda Rutherford M.D.   On: 11/25/2023 16:08   DG C-Arm 1-60 Min-No Report Result Date: 11/25/2023 Fluoroscopy was utilized by the requesting physician.  No radiographic interpretation.   DG C-Arm 1-60 Min-No Report Result Date: 11/25/2023 Fluoroscopy was utilized by the requesting physician.  No radiographic interpretation.   DG C-Arm 1-60 Min-No Report Result Date:  11/25/2023 Fluoroscopy was utilized by the requesting physician.  No radiographic interpretation.   DG C-Arm 1-60 Min-No Report Result Date: 11/25/2023 Fluoroscopy was utilized by the requesting physician.  No radiographic interpretation.   DG Thoracic Spine 2 View Result Date: 11/23/2023 CLINICAL DATA:  Back pain. EXAM: THORACIC SPINE 2 VIEWS COMPARISON:  Thoracic spine MRI 10/2023 FINDINGS: Progressive disc space narrowing and wedging of T6 and T7 worrisome for discitis and osteomyelitis and pathologic compression fracture. Recommend MRI thoracic spine without and with contrast for further evaluation. On the frontal film there appears to be abnormal paraspinal soft tissue density worrisome for hematoma or abscess. IMPRESSION: 1. Progressive disc space narrowing and wedging of T6 and T7 worrisome for discitis and osteomyelitis and pathologic compression fracture. Recommend MRI thoracic spine without and with contrast for further evaluation. 2. Abnormal paraspinal soft tissue density worrisome for hematoma or abscess. Electronically Signed   By: Rudie Meyer M.D.   On: 11/23/2023 21:32   DG Lumbar Spine Complete Result Date: 11/23/2023 CLINICAL DATA:  Back pain. EXAM: LUMBAR SPINE - COMPLETE 4+ VIEW COMPARISON:  Lumbar spine MRI 10/23/2023 FINDINGS: Stable degenerative anterolisthesis of L3  and L4. No acute bony findings or destructive bony changes. IMPRESSION: 1. Stable degenerative anterolisthesis of L3 and L4. 2. No acute bony findings. Electronically Signed   By: Rudie Meyer M.D.   On: 11/23/2023 21:31   MR THORACIC SPINE W WO CONTRAST Result Date: 11/23/2023 CLINICAL DATA:  Follow-up discitis/osteomyelitis at T6-7. EXAM: MRI THORACIC WITHOUT AND WITH CONTRAST TECHNIQUE: Multiplanar and multiecho pulse sequences of the thoracic spine were obtained without and with intravenous contrast. CONTRAST:  5mL GADAVIST GADOBUTROL 1 MMOL/ML IV SOLN COMPARISON:  MRI 10/23/2023 CT scan 11/23/2023 FINDINGS: Alignment: Normal overall alignment of the thoracic vertebral bodies. Vertebrae: Diffuse T1 and T2 signal intensity in the T6 and T7 vertebral bodies and further pathologic compression deformities of T6 and T7. The disc space is severely narrowed. There is also fluid in the disc space and diffuse abnormal contrast enhancement. There is also abnormal signal intensity in the T8 vertebral body and marked narrowing of the T7-8 disc space. Findings suspicious for discitis and osteomyelitis at this level also. Associated epidural abscess and extensive epidural enhancing inflammatory phlegmon surrounding and compressing the thoracic spinal cord. This extends from T5-6-2 T8-9. Is also abnormal signal intensity in the T8 vertebral body and marked narrowing of the T7-8 disc space. Findings suspicious for discitis and osteomyelitis at this level also. Cord: No definite cord lesions or syrinx. No abnormal cord enhancement. Paraspinal and other soft tissues: Extensive paraspinal abscess and inflammatory phlegmon extending from T5-6 down to T8-9. Disc levels: No significant findings above the T5-6. As above there is extensive discitis and osteomyelitis at T6-7 and to a lesser extent at T7-8 with paraspinal and epidural abscesses. Degenerative disc disease with bulging discs and osteophytic ridging in the lower thoracic  intervertebral disc spaces. No other sites of discitis or osteomyelitis are identified. Incidental note is made of Abner T1 and T2 signal intensity in the L2 vertebral body along with abnormal contrast enhancement. Could not exclude osteomyelitis in the L2 vertebral body. IMPRESSION: 1. Extensive progressive discitis and osteomyelitis at T6-7 and to a lesser extent at T7-8 with paraspinal and epidural abscesses. 2. Extensive epidural abscess and extensive epidural enhancing inflammatory phlegmon surrounding and compressing the thoracic spinal cord at the above levels. 3. No definite cord lesions or syrinx. No abnormal cord enhancement. 4. Incidental note is made of abnormal T1  and T2 signal intensity and abnormal enhancement in the L2 vertebral body suspicious for osteomyelitis. These results will be called to the ordering clinician or representative by the Radiologist Assistant, and communication documented in the PACS or [employer]. Electronically Signed   By: Rudie Meyer M.D.   On: 11/23/2023 21:30   CT T-SPINE NO CHARGE Result Date: 11/23/2023 CLINICAL DATA:  Back pain.  No known injury. EXAM: CT THORACIC SPINE WITHOUT CONTRAST TECHNIQUE: Multidetector CT images of thoracic was performed according to the standard protocol following intravenous contrast administration. RADIATION DOSE REDUCTION: This exam was performed according to the departmental dose-optimization program which includes automated exposure control, adjustment of the mA and/or kV according to patient size and/or use of iterative reconstruction technique. CONTRAST:  75mL OMNIPAQUE IOHEXOL 350 MG/ML SOLN COMPARISON:  Chest CT dated 11/23/2023 and thoracic spine radiograph dated 11/21/2023. Thoracic spine MRI dated 10/23/2023. FINDINGS: Alignment: No acute subluxation. Vertebrae: No acute fracture. Paraspinal and other soft tissues: There is prevertebral soft tissue thickening centered at T6-T7 with inflammatory changes extending into the  posterior mediastinum and surrounding the descending thoracic aorta. No drainable fluid collection or abscess. Disc levels: There is disc space narrowing and endplate irregularity at T6-T9. Progression of endplate irregularity and fragmentation with disc space narrowing at T6-T7. There is slight anterior wedging of T7. Findings consistent with osteomyelitis/discitis centered at T6-T7. Further evaluation with MRI without and with contrast recommended. IMPRESSION: Findings consistent with osteomyelitis/discitis centered at T6-T7. Further evaluation with MRI without and with contrast recommended. Electronically Signed   By: Elgie Collard M.D.   On: 11/23/2023 19:05   CT Angio Chest PE W/Cm &/Or Wo Cm Result Date: 11/23/2023 CLINICAL DATA:  Concern for pulmonary disease. EXAM: CT ANGIOGRAPHY CHEST WITH CONTRAST TECHNIQUE: Multidetector CT imaging of the chest was performed using the standard protocol during bolus administration of intravenous contrast. Multiplanar CT image reconstructions and MIPs were obtained to evaluate the vascular anatomy. RADIATION DOSE REDUCTION: This exam was performed according to the departmental dose-optimization program which includes automated exposure control, adjustment of the mA and/or kV according to patient size and/or use of iterative reconstruction technique. CONTRAST:  75mL OMNIPAQUE IOHEXOL 350 MG/ML SOLN COMPARISON:  Chest radiograph dated 11/23/2023. FINDINGS: Cardiovascular: Top-normal cardiac size. No pericardial effusion. The thoracic aorta is unremarkable. The origins of the great vessels of the aortic arch appear patent as visualized. No pulmonary artery embolus identified. Mediastinum/Nodes: Mildly enlarged right hilar lymph nodes measure 11 mm short axis. The esophagus is grossly unremarkable. No mediastinal fluid collection. Lungs/Pleura: There are bibasilar linear atelectasis. No focal consolidation, pleural effusion, or pneumothorax. The central airways are patent.  Upper Abdomen: No acute abnormality. Musculoskeletal: There is disc space narrowing and endplate irregularity and fragmentation predominantly at T6-T7. There is demineralization of the vertebra along the T6-T7 disc with mild anterior wedging of T7. There is progression of disc space narrowing and fragmentation at T6-T7 since the MRI of 10/23/2023. Findings most consistent with spondylo discitis. Further evaluation with MRI without and with contrast recommended. There is thickening of the prevertebral tissues at T6-T7 with inflammatory changes extending into the posterior mediastinum and surrounding the descending thoracic aorta. No drainable fluid collection or abscess noted. Review of the MIP images confirms the above findings. IMPRESSION: 1. No CT evidence of pulmonary artery embolus. 2. 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 Further evaluation with MRI without and with contrast recommended. Electronically Signed   By: Ceasar Mons.D.  On: 11/23/2023 19:01   DG Chest 2 View Result Date: 11/23/2023 CLINICAL DATA:  Chest pain, muscle spasms EXAM: CHEST - 2 VIEW COMPARISON:  10/28/2023, 10/23/2023 FINDINGS: Frontal and lateral views of the chest demonstrate a stable cardiac silhouette. Right-sided PICC tip within the superior vena cava. Linear areas of consolidation within the left mid and right lower lung zones consistent with subsegmental atelectasis. No airspace disease, effusion, or pneumothorax. There is progressive disc space narrowing within the midthoracic spine, with anterior wedging at the approximally T6 level which appears new since prior MRI. Given previous MRI findings, progressive discitis/osteomyelitis is suspected. IMPRESSION: 1. New anterior wedging within the midthoracic spine at approximately the T6 level, with progressive loss of disc space height, concerning for progressive discitis/osteomyelitis given previous MRI  findings. Repeat MRI imaging may be useful. 2. Scattered areas of subsegmental atelectasis at the lung bases. Electronically Signed   By: Sharlet Salina M.D.   On: 11/23/2023 16:32   DG Foot Complete Left Result Date: 11/13/2023 Please see detailed radiograph report in office note.  US Venous Img Lower Bilateral (DVT) Result Date: 11/02/2023 CLINICAL DATA:  Lower extremity edema EXAM: BILATERAL LOWER EXTREMITY VENOUS DOPPLER ULTRASOUND TECHNIQUE: Gray-scale sonography with compression, as well as color and duplex ultrasound, were performed to evaluate the deep venous system(s) from the level of the common femoral vein through the popliteal and proximal calf veins. COMPARISON:  None Available. FINDINGS: VENOUS Normal compressibility of the common femoral, superficial femoral, and popliteal veins, as well as the visualized calf veins. Visualized portions of profunda femoral vein and great saphenous vein unremarkable. No filling defects to suggest DVT on grayscale or color Doppler imaging. Doppler waveforms show normal direction of venous flow, normal respiratory plasticity and response to augmentation. OTHER None. Limitations: none IMPRESSION: Negative. Electronically Signed   By: Charlett Nose M.D.   On: 11/02/2023 18:28   [place] EKG SITE RITE Result Date: 11/02/2023 If Site Rite image not attached, placement could not be confirmed due to current cardiac rhythm.  DG Foot 2 Views Left Result Date: 10/30/2023 CLINICAL DATA:  Postoperative. EXAM: LEFT FOOT - 2 VIEW COMPARISON:  Left foot radiograph dated 10/24/2023. FINDINGS: Status post resection and cement of the first MTP joint. No acute fracture or dislocation. There is soft tissue swelling of the forefoot. Overlying dressing noted. IMPRESSION: Postsurgical changes of the first MTP joint. Electronically Signed   By: Elgie Collard M.D.   On: 10/30/2023 13:00    Microbiology: Recent Results (from the past 240 hours)  Culture, blood (routine x 2)      Status: None   Collection Time: 11/23/23  5:29 PM   Specimen: BLOOD  Result Value Ref Range Status   Specimen Description BLOOD BLOOD LEFT FOREARM  Final   Special Requests   Final    BOTTLES DRAWN AEROBIC AND ANAEROBIC Blood Culture results may not be optimal due to an excessive volume of blood received in culture bottles   Culture   Final    NO GROWTH 5 DAYS Performed at [facility], [address], [place], [place] [address]    Report Status 11/28/2023 FINAL  Final  Culture, blood (routine x 2)     Status: None   Collection Time: 11/23/23  5:29 PM   Specimen: BLOOD  Result Value Ref Range Status   Specimen Description BLOOD BLOOD RIGHT ARM  Final   Special Requests   Final    BOTTLES DRAWN AEROBIC AND ANAEROBIC Blood Culture results may not  be optimal due to an inadequate volume of blood received in culture bottles   Culture   Final    NO GROWTH 5 DAYS Performed at [facility], [address] [place], [place] [address]    Report Status 11/28/2023 FINAL  Final  Aerobic/Anaerobic Culture w Gram Stain (surgical/deep wound)     Status: None (Preliminary result)   Collection Time: 11/25/23  2:44 PM   Specimen: PATH Soft tissue  Result Value Ref Range Status   Specimen Description WOUND  Final   Special Requests EPIDURAL SPACE  Final   Gram Stain NO WBC SEEN NO ORGANISMS SEEN   Final   Culture   Final    NO GROWTH 3 DAYS NO ANAEROBES ISOLATED; CULTURE IN PROGRESS FOR 5 DAYS Performed at [facility], [address], [place], [place] [address]    Report Status PENDING  Incomplete  Aerobic/Anaerobic Culture w Gram Stain (surgical/deep wound)     Status: None (Preliminary result)   Collection Time: 11/25/23  2:51 PM   Specimen: PATH Soft tissue  Result Value Ref Range Status   Specimen Description WOUND  Final   Special Requests DISC SPACE  Final   Gram Stain   Final    RARE WBC PRESENT, PREDOMINANTLY PMN NO ORGANISMS SEEN    Culture   Final     RARE METHICILLIN RESISTANT STAPHYLOCOCCUS AUREUS Sent to [facility] for further susceptibility testing. Performed at [facility], [address], [place], [place] [address]    Report Status PENDING  Incomplete   Organism ID, Bacteria METHICILLIN RESISTANT STAPHYLOCOCCUS AUREUS  Final      Susceptibility   Methicillin resistant staphylococcus aureus - MIC*    CIPROFLOXACIN <=0.5 SENSITIVE Sensitive     ERYTHROMYCIN >=8 RESISTANT Resistant     GENTAMICIN <=0.5 SENSITIVE Sensitive     OXACILLIN >=4 RESISTANT Resistant     TETRACYCLINE <=1 SENSITIVE Sensitive     VANCOMYCIN 1 SENSITIVE Sensitive     TRIMETH/SULFA <=10 SENSITIVE Sensitive     CLINDAMYCIN <=0.25 SENSITIVE Sensitive     RIFAMPIN <=0.5 SENSITIVE Sensitive     Inducible Clindamycin NEGATIVE Sensitive     LINEZOLID 2 SENSITIVE Sensitive     * RARE METHICILLIN RESISTANT STAPHYLOCOCCUS AUREUS     Labs: Basic Metabolic Panel: Recent Labs  Lab 11/23/23 1549 11/24/23 0559 11/25/23 0539 11/25/23 0806 11/27/23 0452  NA 133* 135  --  130*  --   K 5.6* 4.0  --  4.0  --   CL 103 107  --  101  --   CO2 19* 20*  --  20*  --   GLUCOSE 176* 104*  --  161*  --   BUN 20 15  --  15  --   CREATININE 0.56 0.49 0.56 0.41* 0.42*  CALCIUM 10.0 9.2  --  9.2  --    Liver Function Tests: Recent Labs  Lab 11/23/23 1549  AST 25  ALT 23  ALKPHOS 105  BILITOT 0.7  PROT 8.6*  ALBUMIN 4.0   Recent Labs  Lab 11/23/23 1549  LIPASE 21   No results for input(s): "AMMONIA" in the last 168 hours. CBC: Recent Labs  Lab 11/23/23 1549 11/24/23 0559 11/25/23 0806 11/27/23 0452  WBC 13.1* 10.9* 10.4 13.9*  NEUTROABS  --   --  6.3  --   HGB 11.1* 10.0* 10.1* 9.1*  HCT 34.7* 31.1* 31.2* 27.5*  MCV 95.6 95.4 95.7 92.0  PLT 730* 680*  548* 471*   Cardiac Enzymes: No results for input(s): "CKTOTAL", "CKMB", "CKMBINDEX", "TROPONINI" in the last 168 hours. BNP: BNP (last 3 results) No results for input(s): "BNP" in the last 8760  hours.  ProBNP (last 3 results) No results for input(s): "PROBNP" in the last 8760 hours.  CBG: Recent Labs  Lab 11/25/23 1112  GLUCAP 126*       Signed:  Silvano Bilis MD.  [facility] 11/28/2023, 1:16 PM

## 2023-11-28 NOTE — Plan of Care (Signed)
  Problem: Education: Goal: Knowledge of General Education information will improve Description: Including pain rating scale, medication(s)/side effects and non-pharmacologic comfort measures Outcome: Progressing   Problem: Health Behavior/Discharge Planning: Goal: Ability to manage health-related needs will improve Outcome: Progressing   Problem: Clinical Measurements: Goal: Diagnostic test results will improve Outcome: Progressing   Problem: Activity: Goal: Risk for activity intolerance will decrease Outcome: Progressing   Problem: Coping: Goal: Level of anxiety will decrease Outcome: Progressing   Problem: Pain Managment: Goal: General experience of comfort will improve and/or be controlled Outcome: Progressing   Problem: Safety: Goal: Ability to remain free from injury will improve Outcome: Progressing   Problem: Pain Management: Goal: Pain level will decrease Outcome: Progressing

## 2023-11-28 NOTE — Treatment Plan (Addendum)
Diagnosis: MRSA infection was progressing on Dapto- Thoracic spine discitis, osteomyelitis,,epidural abscess, vertebral compression fractures- s/p debridement  T3-T9 fusion surgery  Baseline Creatinine 0.4    Allergies  Allergen Reactions   Diclofenac Hives    OPAT Orders Discharge antibiotics: Per pharmacy protocol  Vancomycin 1250 mg every 12 hours for 8 weeks( may need more) Aim for Vancomycin trough 18-20  And  PO rifampin 300mg  BID for 8 weeks  8 weeks End Date:01/19/24 This will be followed by PO antibiotic for a longer time While on IV vanco monitor Kidney and counts  While on Rifampin watch for any hepatic dysfunction, rash   [facility] Care Per Protocol:  Labs weekly on Monday while on IV antibiotics: _X_ CBC with differential _X_ CMP _X_ CRP _X_ ESR _X_ Vancomycin trough  Labs weekly on Thursday _X_ BMP _X_ Vancomycin trough  _X_ Please pull PIC at completion of IV antibiotics   Fax weekly lab results  promptly to [phone]  Clinic Follow Up Appt: 12/30/23 at 10.15 am with Cypress Hinkson in person 12/04/23- telephone visit   Call [phone] with critical value or questions

## 2023-11-28 NOTE — Progress Notes (Signed)
Physical Therapy Treatment Patient Details Name: Danielle Harrison MRN: [id number] DOB: Apr 22, 1976 Today's Date: 11/28/2023   History of Present Illness Danielle Harrison is a 48 y.o. 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 Patient underwent surgery on 12/11 d/t thoracic osteomyelitis, discitis, epidural abscess. Patient is s/p T4-9 Post Spinal Fusion, T6-7 thoracic laminectomy, right sided transpedicular decompression and disc debridement.    PT Comments  Pt was long sitting in bed upon arrival. She is A and O x 4. Easily and safely able to exit bed, stand, and ambulate without physical assistance or use of AD. Session focused on simulation of home environment. She has 18 stairs at home.Pt was able to safely perform 2 flights of 12 stairs this session. Pt is cleared from an acute PT standpoint for safe DC home once deems medically stable.     If plan is discharge home, recommend the following: Assist for transportation     Equipment Recommendations  None recommended by PT       Precautions / Restrictions Precautions Precautions: Back Precaution Booklet Issued: Yes (comment) Recall of Precautions/Restrictions: Intact Precaution/Restrictions Comments: Patient able to maintain precautions well throughout session without cues. Restrictions Weight Bearing Restrictions Per Provider Order: Yes LLE Weight Bearing Per Provider Order: Weight bearing as tolerated     Mobility  Bed Mobility Overal bed mobility: Modified Independent   Transfers Overall transfer level: Modified independent Equipment used: None   Ambulation/Gait Ambulation/Gait assistance: Modified independent (Device/Increase time) Gait Distance (Feet): 200 Feet Assistive  device: None  General Gait Details: pt was able to ambulate without AD today. session mostly foused on hperforming 18 stair to simulate home environment.   Stairs Stairs: Yes Stairs assistance: Modified independent (Device/Increase time) Stair Management: Alternating pattern, One rail Left Number of Stairs: 24 General stair comments: Pt has 18 stairs at home. she demonstrated abilities to perform 24 today without difficulty or LOB    Balance Overall balance assessment: Needs assistance Sitting-balance support: Feet supported, No upper extremity supported Sitting balance-Leahy Scale: Normal     Standing balance support: No upper extremity supported, During functional activity Standing balance-Leahy Scale: Good Standing balance comment: no LOB today even without UE support       Communication Communication Communication: No apparent difficulties  Cognition Arousal: Alert Behavior During Therapy: WFL for tasks assessed/performed   PT - Cognitive impairments: No family/caregiver present to determine baseline    PT - Cognition Comments: Pt is alert and oriented x 4 Following commands: Intact      Cueing Cueing Techniques: Verbal cues         Pertinent Vitals/Pain Pain Assessment Pain Assessment: 0-10 Pain Score: 3  Pain Location: L foot pain Pain Descriptors / Indicators: Sore, Tender Pain Intervention(s): Limited activity within patient's tolerance, Monitored during session, Repositioned     PT Goals (current goals can now be found in the care plan section) Acute Rehab PT Goals Patient Stated Goal: go home Progress towards PT goals: Progressing toward goals    Frequency    Min 1X/week           Co-evaluation     PT goals addressed during session: Mobility/safety with mobility;Balance;Proper use of DME;Strengthening/ROM        AM-PAC PT "6 Clicks" Mobility   Outcome Measure  Help needed turning from your  back to your side while in a flat bed without  using bedrails?: None Help needed moving from lying on your back to sitting on the side of a flat bed without using bedrails?: None Help needed moving to and from a bed to a chair (including a wheelchair)?: A Little Help needed standing up from a chair using your arms (e.g., wheelchair or bedside chair)?: A Little Help needed to walk in hospital room?: A Little Help needed climbing 3-5 steps with a railing? : A Little 6 Click Score: 20    End of Session   Activity Tolerance: Patient tolerated treatment well Patient left: in chair;with call bell/phone within reach (pt did not have alarm on pre/post session) Nurse Communication: Mobility status PT Visit Diagnosis: Unsteadiness on feet (R26.81);Pain;Other abnormalities of gait and mobility (R26.89)     Time: 0454-0981 PT Time Calculation (min) (ACUTE ONLY): 8 min  Charges:    $Gait Training: 8-22 mins PT General Charges $$ ACUTE PT VISIT: 1 Visit                     Jetta Lout PTA 11/28/23, 10:49 AM

## 2023-11-28 NOTE — Progress Notes (Signed)
PHARMACY CONSULT NOTE FOR:  OUTPATIENT  PARENTERAL ANTIBIOTIC THERAPY (OPAT)  Indication: Worsening MRSA lumbar discitis and epidural abscess (previously on daptomycin) Regimen: Vancomycin 1250mg  IV q12h Plus oral rifampin 300mg  po BID End date: 01/19/2024  -Labs - Sunday/Monday:  CBC/D, CMP, ESR, CRP and vancomycin trough. -Labs - Thursday:  BMP and vancomycin trough -Goal Vancmycin trough 18-20 mcg/mL -Fax weekly lab results  promptly to [phone] -Please pull PIC at completion of IV antibiotics -Call [phone] with critical value or questions   IV antibiotic discharge orders are pended. To discharging provider:  please sign these orders via discharge navigator,  Select New Orders & click on the button choice - Manage This Unsigned Work.     Thank you for allowing pharmacy to be a part of this patient's care.  Juliette Alcide, PharmD, BCPS, BCIDP Work Cell: [phone] 11/28/2023 11:28 AM

## 2023-11-28 NOTE — Progress Notes (Signed)
Date of Admission:  11/23/2023    ID: Danielle Harrison is a 48 y.o. female  Principal Problem:   Osteomyelitis of thoracic spine (HCC) Active Problems:   Asthma   Septic arthritis of left foot (HCC)   HTN (hypertension)   Hyperkalemia   Epidural abscess   Acute osteomyelitis of thoracic spine (HCC)   Pathologic fracture of thoracic vertebrae, initial encounter   MRSA infection    Subjective:  No specific complaints  Medications:   amLODipine  10 mg Oral Daily   baclofen  10 mg Oral Daily   Chlorhexidine Gluconate Cloth  6 each Topical Daily   docusate sodium  100 mg Oral BID   enoxaparin (LOVENOX) injection  40 mg Subcutaneous Q24H   gabapentin  100 mg Oral TID   methocarbamol  500 mg Oral Q6H   Or   methocarbamol (ROBAXIN) injection  500 mg Intravenous Q6H   metoprolol tartrate  100 mg Oral BID   multivitamin with minerals  1 tablet Oral Daily   rifampin  300 mg Oral Q12H   senna  1 tablet Oral BID   sodium chloride flush  10-40 mL Intracatheter Q12H   sodium chloride flush  3 mL Intravenous Q12H   sodium zirconium cyclosilicate  10 g Oral Once    Objective: Vital signs in last 24 hours: Patient Vitals for the past 24 hrs:  BP Temp Pulse Resp SpO2  11/28/23 1512 124/86 98.4 F (36.9 C) 100 20 100 %  11/28/23 0812 (!) 133/95 98.5 F (36.9 C) 99 18 100 %  11/27/23 2113 124/85 -- (!) 106 -- --  11/27/23 2111 124/85 99.5 F (37.5 C) (!) 106 18 100 %     LDA Rt PICC  PHYSICAL EXAM:  General: Alert, cooperative, no distress, appears stated age.  Back: surgical dressing over thoracic spine-  Lungs: b/l air entry Heart: s1s2 Abdomen: Soft, non-tender,not distended. Bowel sounds normal. No masses Extremities: atraumatic, no cyanosis. No edema. No clubbing Skin: No rashes or lesions. Or bruising Lymph: Cervical, supraclavicular normal. Neurologic: Grossly non-focal  Lab Results    Latest Ref Rng & Units 11/27/2023    4:52 AM 11/25/2023    8:06 AM 11/24/2023     5:59 AM  CBC  WBC 4.0 - 10.5 K/uL 13.9  10.4  10.9   Hemoglobin 12.0 - 15.0 g/dL 9.1  16.1  09.6   Hematocrit 36.0 - 46.0 % 27.5  31.2  31.1   Platelets 150 - 400 K/uL 471  548  680        Latest Ref Rng & Units 11/27/2023    4:52 AM 11/25/2023    8:06 AM 11/25/2023    5:39 AM  CMP  Glucose 70 - 99 mg/dL  045    BUN 6 - 20 mg/dL  15    Creatinine 4.09 - 1.00 mg/dL 8.11  9.14  7.82   Sodium 135 - 145 mmol/L  130    Potassium 3.5 - 5.1 mmol/L  4.0    Chloride 98 - 111 mmol/L  101    CO2 22 - 32 mmol/L  20    Calcium 8.9 - 10.3 mg/dL  9.2        Microbiology: 2/9 BC ng Disc space -MRSA - susceptible to vanco MIC 1 Epidural space nogrowth so far      Assessment/Plan: MRSA bacteremia in Jan 2025 with T6-T7 discitis  and left great toe septic arthritis and osteo She has been on appropriate  antibiotic since last admission and it is now 23 days of Iv daptomycin and there is progression of the vertebral osteomyelitis and now with epidural abscess and nerve compression causing severe pain. She underwent surgery on 11/25/23 1. Posterior Segmental Instrumentation T4 to T9 2. Posterolateral arthrodesis from T4 to T9 3. Thoracic lamina to me at T6 and T7 4.  Right sided transpedicular disc debridement and washout of ventral epidural abscess 5.  Autograft harvest spinous processes 6.  Allograft placement  Culture sent 1 has come positive for MRSA- vanco mic 1 She is on vancomycin Dose increased as levels not in the range Pt will need 8- 12 weeks of IV antibiotic post surgery depending and may need Po for a very long time-  She is going to need rifampin  along with vanco  Sed rate and crp have improved from last month Anemia   Thrombocytosis due to infection   Left great toe infection with septic arthritis and osteo of the Met- s/p resection- site has healed well    H/o pancreatitis- had small pseudocysts in CT from 1/14 She is now totally asymptomatic  OPAT written Will  check vanco levels next Monday as OP Will follow her as OP Discussed the management with patient and hospitalist

## 2023-11-29 DIAGNOSIS — M86172 Other acute osteomyelitis, left ankle and foot: Secondary | ICD-10-CM | POA: Diagnosis not present

## 2023-11-30 LAB — AEROBIC/ANAEROBIC CULTURE W GRAM STAIN (SURGICAL/DEEP WOUND)
Culture: NO GROWTH
Gram Stain: NONE SEEN

## 2023-12-01 ENCOUNTER — Other Ambulatory Visit: Payer: Self-pay

## 2023-12-01 ENCOUNTER — Ambulatory Visit (INDEPENDENT_AMBULATORY_CARE_PROVIDER_SITE_OTHER): Payer: Medicaid Other | Admitting: Physician Assistant

## 2023-12-01 VITALS — BP 110/78 | Ht 60.0 in | Wt 110.0 lb

## 2023-12-01 DIAGNOSIS — Z9889 Other specified postprocedural states: Secondary | ICD-10-CM

## 2023-12-01 DIAGNOSIS — M4624 Osteomyelitis of vertebra, thoracic region: Secondary | ICD-10-CM

## 2023-12-01 DIAGNOSIS — Z09 Encounter for follow-up examination after completed treatment for conditions other than malignant neoplasm: Secondary | ICD-10-CM

## 2023-12-01 LAB — MINIMUM INHIBITORY CONC. (1 DRUG)

## 2023-12-01 LAB — MIC RESULT

## 2023-12-01 MED ORDER — ACETAMINOPHEN 500 MG PO TABS: 1000.0000 mg | ORAL_TABLET | Freq: Three times a day (TID) | ORAL | 2 refills | Status: DC | PRN

## 2023-12-01 NOTE — Progress Notes (Signed)
   REFERRING PHYSICIAN:  Scoggins, Amber, [address] [place],  [place] [address]  DOS: 11/25/23, T4-9 posterior spinal fusion, T6-7 thoracic laminectomy, R sided transpedicular decompression and disc debriedment  HISTORY OF PRESENT ILLNESS: Danielle Harrison is approximately one week s/p T4-9 posterior spinal fusion, T6-7 thoracic laminectomy, R sided transpedicular decompression and disc debriedment after findings of MRSA bacteremia and thoracic osteomyelitis.  She comes today for wound VAC removal.  Overall she is doing well however continues to have quite a bit of back pain.  No new weakness, numbness or tingling.  No changes to her bowel or bladder.    PHYSICAL EXAMINATION:  General: Patient is well developed, well nourished, calm, collected, and in no apparent distress.   NEUROLOGICAL:  General: In no acute distress.   Awake, alert, oriented to person, place, and time.  Pupils equal round and reactive to light.  Facial tone is symmetric.    Strength:            Side Iliopsoas Quads Hamstring PF DF EHL  R 5 5 5 5 5 5   L 5 5 5 5 5 5    Incision c/d/I.  Well-appearing.  No erythema.   ROS (Neurologic):  Negative except as noted above  IMAGING: No new imaging since discharge.  ASSESSMENT/PLAN:  Danielle Harrison is approximately one week s/p T4-9 posterior spinal fusion, T6-7 thoracic laminectomy, R sided transpedicular decompression and disc debriedment after findings of MRSA bacteremia and thoracic osteomyelitis.  She comes today for wound VAC removal.  Overall she is doing well however continues to have quite a bit of back pain.  No new weakness, numbness or tingling.  No changes to her bowel or bladder.  Incision is well-appearing.  No erythema or drainage.  Wound VAC removed without difficulty.I have advised the patient to lift up to 10 pounds until 6 weeks after surgery, then increase up to 25 pounds until 12 weeks after surgery.  After 12 weeks post-op, the patient advised to  increase activity as tolerated.  Patient states she may need more oxycodone in the future.  Also advised her to take Tylenol for her pain.  Plan to see back next week for 2-week postop.  She continues to be followed by infectious disease.  Advised to contact the office if any questions or concerns arise.  Joan Flores PA-C Department of neurosurgery

## 2023-12-02 DIAGNOSIS — M86172 Other acute osteomyelitis, left ankle and foot: Secondary | ICD-10-CM | POA: Diagnosis not present

## 2023-12-03 ENCOUNTER — Other Ambulatory Visit: Payer: Self-pay

## 2023-12-03 ENCOUNTER — Telehealth: Payer: Self-pay

## 2023-12-03 ENCOUNTER — Other Ambulatory Visit: Payer: Self-pay | Admitting: Obstetrics and Gynecology

## 2023-12-03 NOTE — Telephone Encounter (Signed)
 Left another message on pt's personal #. Left a message on mother's home #. Mother's mobile # goes straight to voicemail. Will wait for pt to contact us.

## 2023-12-03 NOTE — Telephone Encounter (Signed)
 Called both #'s again, went to voicemail.

## 2023-12-03 NOTE — Telephone Encounter (Signed)
 please see below-   Media Information   Document Information

## 2023-12-03 NOTE — Telephone Encounter (Signed)
 I received a call from the answering service that the patient contacted them with increased pain and concerns with a lump at her incision site. She informed them she is out of oxycodone. Pharmacy is [facility]. Ph# [phone]. Attempted to call this number x 3, voicemail is not set up. Attempted to reach pt's main number, left a message that I would like to talk to her about the message she left the answering service and that we would like her to send a picture of her incision through mychart.  Per her medication list: Tylenol 1000mg  every 8 hours as needed Baclofen 10 daily Gabapentin 100 TID

## 2023-12-03 NOTE — Telephone Encounter (Signed)
 I attempted to reach her again in the # that was left with the answering service [phone]). Call goes straight to voicemail and voicemail box is not set up.

## 2023-12-03 NOTE — Telephone Encounter (Signed)
 Attempted to contact patient again (on both #'s), call went to voicemail. Also sent mychart message.

## 2023-12-03 NOTE — Telephone Encounter (Addendum)
 I called and left a message on every # in her chart multiple times today, hers, and her mom's.

## 2023-12-04 ENCOUNTER — Other Ambulatory Visit: Payer: Self-pay | Admitting: Obstetrics and Gynecology

## 2023-12-04 ENCOUNTER — Other Ambulatory Visit: Payer: Self-pay

## 2023-12-04 ENCOUNTER — Telehealth: Payer: Medicaid Other | Admitting: Infectious Diseases

## 2023-12-04 ENCOUNTER — Other Ambulatory Visit: Payer: Self-pay | Admitting: Neurosurgery

## 2023-12-04 DIAGNOSIS — Z9889 Other specified postprocedural states: Secondary | ICD-10-CM

## 2023-12-04 DIAGNOSIS — M86172 Other acute osteomyelitis, left ankle and foot: Secondary | ICD-10-CM | POA: Diagnosis not present

## 2023-12-04 MED ORDER — OXYCODONE HCL 5 MG PO TABS: 5.0000 mg | ORAL_TABLET | ORAL | 0 refills | Status: DC | PRN

## 2023-12-04 NOTE — Telephone Encounter (Signed)
 Attempted to call patient via primary number in chart [phone]) Left voicemail to return our calls.

## 2023-12-04 NOTE — Telephone Encounter (Signed)
 I called patient by number listed on the answering service. Patient is requesting refill for Oxycodone script. Patient requesting a call when refill is placed.

## 2023-12-04 NOTE — Telephone Encounter (Addendum)
 I called patient to ask her to send [place] a mychart message with her incision. She says that she has a lump at the top staple. She is going to send a picture for [place] to review. She will call if she has any issues doing so.   Patient says she is taking tylenol as prescribed since she is out of the oxycodone. Endorses taking baclofen and gabapentin as prescribed.

## 2023-12-04 NOTE — Telephone Encounter (Signed)
Yes I did

## 2023-12-05 ENCOUNTER — Telehealth: Payer: Self-pay | Admitting: Infectious Diseases

## 2023-12-05 ENCOUNTER — Other Ambulatory Visit: Payer: Self-pay

## 2023-12-05 NOTE — Telephone Encounter (Signed)
 Called patient to find out how she is doing with Iv vancomycin Her trough was 6.4 and cr was 0.48 from 12/02/23  and Amy pharmacist from [facility] increased the frequency to 1250mg  Q8 ( from Q12). Levels will be checked again on Monday and dose adjusted She is doing fine and knows to contact my office if there is any side effects or issues when I am gone . Will also inform our Op pharmacist about her dosing and level. Danielle Harrison is Danielle Harrison with her insurance and that is the name under which labs are sent

## 2023-12-06 DIAGNOSIS — M86172 Other acute osteomyelitis, left ankle and foot: Secondary | ICD-10-CM | POA: Diagnosis not present

## 2023-12-07 LAB — AEROBIC/ANAEROBIC CULTURE W GRAM STAIN (SURGICAL/DEEP WOUND)

## 2023-12-08 DIAGNOSIS — M86172 Other acute osteomyelitis, left ankle and foot: Secondary | ICD-10-CM | POA: Diagnosis not present

## 2023-12-09 ENCOUNTER — Other Ambulatory Visit: Payer: Self-pay

## 2023-12-09 ENCOUNTER — Ambulatory Visit (INDEPENDENT_AMBULATORY_CARE_PROVIDER_SITE_OTHER): Payer: Medicaid Other | Admitting: Neurosurgery

## 2023-12-09 VITALS — BP 114/74 | Temp 98.1°F | Ht 60.0 in | Wt 110.0 lb

## 2023-12-09 DIAGNOSIS — Z9889 Other specified postprocedural states: Secondary | ICD-10-CM

## 2023-12-09 DIAGNOSIS — Z981 Arthrodesis status: Secondary | ICD-10-CM

## 2023-12-09 MED ORDER — OXYCODONE HCL 5 MG PO TABS
5.0000 mg | ORAL_TABLET | Freq: Four times a day (QID) | ORAL | 0 refills | Status: DC | PRN
Start: 2023-12-09 — End: 2023-12-16

## 2023-12-09 MED ORDER — METHOCARBAMOL 500 MG PO TABS
500.0000 mg | ORAL_TABLET | Freq: Four times a day (QID) | ORAL | 1 refills | Status: DC | PRN
Start: 2023-12-09 — End: 2024-02-23

## 2023-12-09 NOTE — Progress Notes (Signed)
   REFERRING PHYSICIAN:  [name], [address] [place],  [place] [address]  DOS: 11/25/23 T6-7 laminectomy, right transpedicular decompression and disc debridement, T4-T10 posterior spinal fusion.  HISTORY OF PRESENT ILLNESS: Danielle Harrison is approximately 2 weeks status post thoracic decompression and fusion. she is doing well.  While she does have some ongoing back pain she states this is much better.  She would like to change her muscle relaxer from baclofen to something different.  She is otherwise doing relatively well.  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  PHYSICAL EXAMINATION:  General: Patient is well developed, well nourished, calm, collected, and in no apparent distress.   NEUROLOGICAL:  General: In no acute distress.   Awake, alert, oriented to person, place, and time.  Pupils equal round and reactive to light.  Facial tone is symmetric.  Tongue protrusion is midline.  There is no pronator drift.   Strength:            Side Iliopsoas Quads Hamstring PF DF EHL  R 5 5 5 5 5 5   L 5 5 5 5 5 5    Incision c/d/I and healing well    ROS (Neurologic):  Negative except as noted above  IMAGING: No interval imaging to review  ASSESSMENT/PLAN:  Danielle Harrison is doing well approximately 2 weeks after thoracic decompression and fusion for MRSA osteodiscitis.  I have instructed her to stop taking baclofen and we will switch her to Robaxin to take as needed.  I have given her a refill of her oxycodone to take every 6 hours as needed.  Discussed activity escalation and I have advised the patient to lift up to 10 pounds until 6 weeks after surgery, then increase up to 25 pounds until 12 weeks after surgery.  After 12 weeks post-op, the patient advised to increase activity as tolerated. she will follow up with Dr. Katrinka Blazing in 4 weeks with thoracic x-rays prior.  She was encouraged to call the office in the interim with any questions or concerns.  She  expressed understanding and was in agreement with this plan.  Manning Charity PA-C Department of neurosurgery

## 2023-12-10 ENCOUNTER — Telehealth: Payer: Self-pay | Admitting: Neurosurgery

## 2023-12-10 NOTE — Telephone Encounter (Signed)
 [facility] adjusted the pill count to 5 day supply. Insurance would not cover the requested amount. They will notified patient.

## 2023-12-10 NOTE — Telephone Encounter (Signed)
 Patient is calling to let our office know that when she called to check on the status of the Oxycodone that was prescribed the message from the pharmacy states for her to contact her doctor's office. She is wondering if there is an issue with the prescription.   [facility]

## 2023-12-11 DIAGNOSIS — M86172 Other acute osteomyelitis, left ankle and foot: Secondary | ICD-10-CM | POA: Diagnosis not present

## 2023-12-13 DIAGNOSIS — M86172 Other acute osteomyelitis, left ankle and foot: Secondary | ICD-10-CM | POA: Diagnosis not present

## 2023-12-14 ENCOUNTER — Other Ambulatory Visit: Payer: Self-pay | Admitting: Cardiology

## 2023-12-15 DIAGNOSIS — M86172 Other acute osteomyelitis, left ankle and foot: Secondary | ICD-10-CM | POA: Diagnosis not present

## 2023-12-16 ENCOUNTER — Telehealth: Payer: Self-pay | Admitting: Neurosurgery

## 2023-12-16 ENCOUNTER — Other Ambulatory Visit: Payer: Self-pay | Admitting: Physician Assistant

## 2023-12-16 DIAGNOSIS — Z9889 Other specified postprocedural states: Secondary | ICD-10-CM

## 2023-12-16 MED ORDER — OXYCODONE HCL 5 MG PO TABS: 5.0000 mg | ORAL_TABLET | Freq: Four times a day (QID) | ORAL | 0 refills | Status: DC | PRN

## 2023-12-16 NOTE — Telephone Encounter (Signed)
 Notified pt via [employer]

## 2023-12-16 NOTE — Telephone Encounter (Signed)
 Please let her know that [facility] sent this in. Thank you

## 2023-12-16 NOTE — Telephone Encounter (Signed)
 T6-T7 thoracic laminectomy, right sided transpedicular decompression and disc debridement, T4-T9 Posterior Spinal Fusion on 11/25/23  Oxycodone 5mg  every 4 hours [facility]  Next appt 01/05/2024

## 2023-12-17 ENCOUNTER — Ambulatory Visit (INDEPENDENT_AMBULATORY_CARE_PROVIDER_SITE_OTHER): Admitting: Cardiology

## 2023-12-17 ENCOUNTER — Encounter: Payer: Self-pay | Admitting: Cardiology

## 2023-12-17 VITALS — BP 110/80 | HR 128 | Ht 60.0 in | Wt 111.0 lb

## 2023-12-17 DIAGNOSIS — E119 Type 2 diabetes mellitus without complications: Secondary | ICD-10-CM | POA: Insufficient documentation

## 2023-12-17 DIAGNOSIS — Z13220 Encounter for screening for lipoid disorders: Secondary | ICD-10-CM | POA: Diagnosis not present

## 2023-12-17 DIAGNOSIS — Z1231 Encounter for screening mammogram for malignant neoplasm of breast: Secondary | ICD-10-CM | POA: Diagnosis not present

## 2023-12-17 DIAGNOSIS — I10 Essential (primary) hypertension: Secondary | ICD-10-CM | POA: Diagnosis not present

## 2023-12-17 MED ORDER — METFORMIN HCL 500 MG PO TABS: 500.0000 mg | ORAL_TABLET | Freq: Two times a day (BID) | ORAL | 1 refills | Status: DC

## 2023-12-17 MED ORDER — BISACODYL 5 MG PO TBEC
10.0000 mg | DELAYED_RELEASE_TABLET | Freq: Every evening | ORAL | 0 refills | Status: DC | PRN
Start: 2023-12-17 — End: 2024-01-21

## 2023-12-17 MED ORDER — METOPROLOL TARTRATE 100 MG PO TABS: 100.0000 mg | ORAL_TABLET | Freq: Two times a day (BID) | ORAL | 1 refills | Status: DC

## 2023-12-17 MED ORDER — BUDESONIDE-FORMOTEROL FUMARATE 160-4.5 MCG/ACT IN AERO: 2.0000 | INHALATION_SPRAY | Freq: Two times a day (BID) | RESPIRATORY_TRACT | 3 refills | Status: DC

## 2023-12-17 MED ORDER — ALBUTEROL SULFATE HFA 108 (90 BASE) MCG/ACT IN AERS
1.0000 | INHALATION_SPRAY | Freq: Four times a day (QID) | RESPIRATORY_TRACT | 3 refills | Status: AC | PRN
Start: 2023-12-17 — End: ?

## 2023-12-17 NOTE — Progress Notes (Signed)
 Established Patient Office Visit  Subjective:  Patient ID: Danielle Harrison, female    DOB: 19-Mar-1976  Age: 48 y.o. MRN: [id number]  Chief Complaint  Patient presents with   Follow-up    FOLLOW UP    Patient in office for hospital follow up. Patient feeling much better post T6-7 laminectomy, right transpedicular decompression and disc debridement, T4-T10 posterior spinal fusion on 11/25/23. Due for mammogram, will send order.  Due for fasting lab work. Patient will return when fasting.     No other concerns at this time.   Past Medical History:  Diagnosis Date   Anxiety    Arthritis    back   Asthma    HTN (hypertension)    Neuromuscular disorder (HCC)    weakness and numbness    Restless leg syndrome     Past Surgical History:  Procedure Laterality Date   APPLICATION OF INTRAOPERATIVE CT SCAN N/A 11/25/2023   Procedure: APPLICATION OF INTRAOPERATIVE CT SCAN;  Surgeon: Lovenia Kim, MD;  Location: [facility];  Service: Neurosurgery;  Laterality: N/A;   BIOPSY  06/28/2019   Procedure: BIOPSY;  Surgeon: Pasty Spillers, MD;  Location: [facility];  Service: Endoscopy;;   CESAREAN SECTION     x2   COLONOSCOPY WITH PROPOFOL N/A 06/28/2019   Procedure: COLONOSCOPY WITH PROPOFOL;  Surgeon: Pasty Spillers, MD;  Location: [facility];  Service: Endoscopy;  Laterality: N/A;   ESOPHAGOGASTRODUODENOSCOPY (EGD) WITH PROPOFOL N/A 06/28/2019   Procedure: ESOPHAGOGASTRODUODENOSCOPY (EGD) WITH PROPOFOL;  Surgeon: Pasty Spillers, MD;  Location: [facility];  Service: Endoscopy;  Laterality: N/A;   INCISION AND DRAINAGE Left 10/27/2023   Procedure: INCISION AND DRAINAGE LEFT FOOT;  Surgeon: Pilar Plate, DPM;  Location: [facility];  Service: Orthopedics/Podiatry;  Laterality: Left;   INCISION AND DRAINAGE Left 10/30/2023   Procedure: LEFT FOOT FIRST METATARSAL PHALANGEAL JOINT RESECTION, ANTIBIOTIC SPACER, WASHOUT, WOUND CLOSURE;   Surgeon: Pilar Plate, DPM;  Location: [facility];  Service: Orthopedics/Podiatry;  Laterality: Left;   TEE WITHOUT CARDIOVERSION N/A 10/29/2023   Procedure: TRANSESOPHAGEAL ECHOCARDIOGRAM (TEE);  Surgeon: Antonieta Iba, MD;  Location: [facility];  Service: Cardiovascular;  Laterality: N/A;   THORACIC LAMINECTOMY FOR EPIDURAL ABSCESS N/A 11/25/2023   Procedure: T6-T7 thoracic laminectomy, right sided transpedicular decompression and disc debridement;  Surgeon: Lovenia Kim, MD;  Location: [facility];  Service: Neurosurgery;  Laterality: N/A;   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   Procedure: LEFT FOOT 1ST MPJ RESECTION;  Surgeon: Pilar Plate, DPM;  Location: [facility];  Service: Orthopedics/Podiatry;  Laterality: Left;   TRANSMETATARSAL AMPUTATION Left 10/30/2023   Procedure: LEFT FOOT 1ST MPJ RESECTION;  Surgeon: Pilar Plate, DPM;  Location: [facility];  Service: Orthopedics/Podiatry;  Laterality: Left;    Social History   Socioeconomic History   Marital status: Married    Spouse name: Not on file   Number of children: Not on file   Years of education: Not on file   Highest education level: Not on file  Occupational History   Not on file  Tobacco Use   Smoking status: Former    Current packs/day: 0.50    Average packs/day: 0.5 packs/day for 6.0 years (3.0 ttl pk-yrs)    Types: Cigarettes   Smokeless tobacco: Never   Tobacco comments:    Quit 8/22  Vaping Use   Vaping status: Every Day   Substances: Nicotine  Substance and Sexual Activity   Alcohol use: Not Currently  Comment: 3 times per week   Drug use: Never   Sexual activity: Not on file  Other Topics Concern   Not on file  Social History Narrative   Not on file   Social Drivers of Health   Financial Resource Strain: Not on file  Food Insecurity: No Food Insecurity (11/24/2023)   Hunger Vital Sign    Worried About Running Out of Food in the Last Year: Never true    Ran Out of  Food in the Last Year: Never true  Transportation Needs: No Transportation Needs (11/24/2023)   PRAPARE - Administrator, Civil Service (Medical): No    Lack of Transportation (Non-Medical): No  Physical Activity: Not on file  Stress: Not on file  Social Connections: Not on file  Intimate Partner Violence: Not At Risk (11/24/2023)   Humiliation, Afraid, Rape, and Kick questionnaire    Fear of Current or Ex-Partner: No    Emotionally Abused: No    Physically Abused: No    Sexually Abused: No    Family History  Problem Relation Age of Onset   Colon cancer Neg Hx     Allergies  Allergen Reactions   Diclofenac Hives    Outpatient Medications Prior to Visit  Medication Sig   [DISCONTINUED] albuterol (VENTOLIN HFA) 108 (90 Base) MCG/ACT inhaler Inhale 1 puff into the lungs every 6 (six) hours as needed for wheezing or shortness of breath.   [DISCONTINUED] budesonide-formoterol (SYMBICORT) 160-4.5 MCG/ACT inhaler Inhale 2 puffs into the lungs 2 (two) times daily.   acetaminophen (TYLENOL) 500 MG tablet Take 2 tablets (1,000 mg total) by mouth every 8 (eight) hours as needed.   baclofen (LIORESAL) 10 MG tablet TAKE 1 TABLET BY MOUTH EVERY DAY   gabapentin (NEURONTIN) 100 MG capsule Take 1 capsule (100 mg total) by mouth 3 (three) times daily.   methocarbamol (ROBAXIN) 500 MG tablet Take 1 tablet (500 mg total) by mouth every 6 (six) hours as needed for muscle spasms.   Multiple Vitamin (MULTIVITAMIN WITH MINERALS) TABS tablet Take 1 tablet by mouth daily.   oxyCODONE (ROXICODONE) 5 MG immediate release tablet Take 1 tablet (5 mg total) by mouth every 6 (six) hours as needed for up to 7 days for severe pain (pain score 7-10).   polyethylene glycol powder (GLYCOLAX/MIRALAX) 17 GM/SCOOP powder Take 17 g by mouth daily. Mix as directed.   rifampin (RIFADIN) 300 MG capsule Take 1 capsule (300 mg total) by mouth every 12 (twelve) hours.   vancomycin IVPB Inject 1,250 mg into the vein  every 12 (twelve) hours. 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 Labs - Thursday:  BMP and vancomycin trough Goal Vancmycin trough 18-20 mcg/mL Fax weekly lab results  promptly to [phone] Method of administration:Elastomeric Method of administration may be changed at the discretion of the patient and/or caregiver's ability to self-administer the medication ordered. Please pull PIC at completion of IV antibiotics Call [phone] with critical value or questions   [DISCONTINUED] bisacodyl (DULCOLAX) 5 MG EC tablet Take 2 tablets (10 mg total) by mouth at bedtime as needed for moderate constipation.   [DISCONTINUED] metFORMIN (GLUCOPHAGE) 500 MG tablet Take 1 tablet (500 mg total) by mouth 2 (two) times daily with a meal.   [DISCONTINUED] metoprolol tartrate (LOPRESSOR) 100 MG tablet Take 1 tablet (100 mg total) by mouth  2 (two) times daily.   No facility-administered medications prior to visit.    Review of Systems  Constitutional: Negative.   HENT: Negative.    Eyes: Negative.   Respiratory: Negative.  Negative for shortness of breath.   Cardiovascular: Negative.  Negative for chest pain.  Gastrointestinal: Negative.  Negative for abdominal pain, constipation and diarrhea.  Genitourinary: Negative.   Musculoskeletal:  Negative for joint pain and myalgias.  Skin: Negative.   Neurological: Negative.  Negative for dizziness and headaches.  Endo/Heme/Allergies: Negative.   All other systems reviewed and are negative.      Objective:   BP 110/80   Pulse (!) 128   Ht 5' (1.524 m)   Wt 111 lb (50.3 kg)   LMP 06/01/2022 (Exact Date)   SpO2 99%   BMI 21.68 kg/m   Vitals:   12/17/23 1133  BP: 110/80  Pulse: (!) 128  Height: 5' (1.524 m)  Weight: 111 lb (50.3 kg)  SpO2: 99%  BMI (Calculated): 21.68     Physical Exam Vitals and nursing note reviewed.  Constitutional:      Appearance: Normal appearance. She is normal weight.  HENT:     Head: Normocephalic and atraumatic.     Nose: Nose normal.     Mouth/Throat:     Mouth: Mucous membranes are moist.  Eyes:     Extraocular Movements: Extraocular movements intact.     Conjunctiva/sclera: Conjunctivae normal.     Pupils: Pupils are equal, round, and reactive to light.  Cardiovascular:     Rate and Rhythm: Normal rate and regular rhythm.     Pulses: Normal pulses.     Heart sounds: Normal heart sounds.  Pulmonary:     Effort: Pulmonary effort is normal.     Breath sounds: Normal breath sounds.  Abdominal:     General: Abdomen is flat. Bowel sounds are normal.     Palpations: Abdomen is soft.  Musculoskeletal:        General: Normal range of motion.     Cervical back: Normal range of motion.  Skin:    General: Skin is warm and dry.  Neurological:     General: No focal deficit present.     Mental Status: She is alert and oriented to person, place, and time.  Psychiatric:        Mood and Affect: Mood normal.        Behavior: Behavior normal.        Thought Content: Thought content normal.        Judgment: Judgment normal.      No results found for any visits on 12/17/23.  Recent Results (from the past 2160 hours)  Lipase, blood     Status: Abnormal   Collection Time: 10/18/23  1:04 PM  Result Value Ref Range   Lipase 554 (H) 11 - 51 U/L    Comment: RESULTS CONFIRMED BY MANUAL DILUTION Performed at [facility], [address] [place], [place] [address]   Comprehensive metabolic panel     Status: Abnormal   Collection Time: 10/18/23  1:04 PM  Result Value Ref Range   Sodium 123 (L) 135 - 145 mmol/L   Potassium 3.4 (L) 3.5 - 5.1 mmol/L   Chloride 87 (L) 98 - 111 mmol/L   CO2 17 (L) 22 - 32 mmol/L   Glucose, Bld 299 (H) 70 - 99 mg/dL    Comment: Glucose reference range applies only to samples taken  after fasting for at  least 8 hours.   BUN 33 (H) 6 - 20 mg/dL   Creatinine, Ser 1.61 0.44 - 1.00 mg/dL   Calcium 09.6 8.9 - 04.5 mg/dL   Total Protein 8.9 (H) 6.5 - 8.1 g/dL   Albumin 4.9 3.5 - 5.0 g/dL   AST 37 15 - 41 U/L   ALT 16 0 - 44 U/L   Alkaline Phosphatase 93 38 - 126 U/L   Total Bilirubin 1.9 (H) 0.0 - 1.2 mg/dL   GFR, Estimated >40 >98 mL/min    Comment: (NOTE) Calculated using the CKD-EPI Creatinine Equation (2021)    Anion gap 19 (H) 5 - 15    Comment: Performed at [facility], [address] [place], [place] [address]  CBC     Status: Abnormal   Collection Time: 10/18/23  1:04 PM  Result Value Ref Range   WBC 19.5 (H) 4.0 - 10.5 K/uL   RBC 4.63 3.87 - 5.11 MIL/uL   Hemoglobin 16.1 (H) 12.0 - 15.0 g/dL   HCT 78.2 95.6 - 21.3 %   MCV 95.7 80.0 - 100.0 fL   MCH 34.8 (H) 26.0 - 34.0 pg   MCHC 36.3 (H) 30.0 - 36.0 g/dL   RDW 08.6 57.8 - 46.9 %   Platelets 401 (H) 150 - 400 K/uL   nRBC 0.0 0.0 - 0.2 %    Comment: Performed at [facility], [address] [place], [place] [address]  Urinalysis, Routine w reflex microscopic -Urine, Clean Catch     Status: Abnormal   Collection Time: 10/18/23  4:17 PM  Result Value Ref Range   Color, Urine Rileigh Kawashima (A) YELLOW    Comment: BIOCHEMICALS MAY BE AFFECTED BY COLOR   APPearance TURBID (A) CLEAR   Specific Gravity, Urine 1.032 (H) 1.005 - 1.030   pH 5.0 5.0 - 8.0   Glucose, UA 150 (A) NEGATIVE mg/dL   Hgb urine dipstick MODERATE (A) NEGATIVE   Bilirubin Urine SMALL (A) NEGATIVE   Ketones, ur 5 (A) NEGATIVE mg/dL   Protein, ur >=841 (A) NEGATIVE mg/dL   Nitrite NEGATIVE NEGATIVE   Leukocytes,Ua NEGATIVE NEGATIVE   RBC / HPF 11-20 0 - 5 RBC/hpf   WBC, UA 11-20 0 - 5 WBC/hpf   Bacteria, UA FEW (A) NONE SEEN   Squamous Epithelial / HPF >50 0 - 5 /HPF   Mucus PRESENT    Hyaline Casts, UA PRESENT     Comment: Performed at [facility], [address] [place], [place] [address]  POC urine  preg, ED     Status: None   Collection Time: 10/18/23  4:21 PM  Result Value Ref Range   Preg Test, Ur NEGATIVE NEGATIVE    Comment:        THE SENSITIVITY OF THIS METHODOLOGY IS >24 mIU/mL   Lactic acid, plasma     Status: None   Collection Time: 10/18/23  6:27 PM  Result Value Ref Range   Lactic Acid, Venous 1.2 0.5 - 1.9 mmol/L    Comment: Performed at [facility], [address] [place], [place] [address]  Blood gas, venous     Status: Abnormal   Collection Time: 10/18/23  6:27 PM  Result Value Ref Range   pH, Ven 7.39 7.25 - 7.43   pCO2, Ven 35 (L) 44 - 60 mmHg   pO2, Ven 69 (H) 32 - 45 mmHg   Bicarbonate 21.2 20.0 - 28.0 mmol/L   Acid-base deficit 3.1 (H) 0.0 -  2.0 mmol/L   O2 Saturation 96 %   Patient temperature 37.0    Collection site VENOUS     Comment: Performed at [facility], [address] [place], [place] [address]  Phosphorus     Status: None   Collection Time: 10/18/23  6:27 PM  Result Value Ref Range   Phosphorus 3.6 2.5 - 4.6 mg/dL    Comment: Performed at [facility], [address] [place], [place] [address]  Osmolality     Status: None   Collection Time: 10/18/23  6:27 PM  Result Value Ref Range   Osmolality 287 275 - 295 mOsm/kg    Comment: REPEATED TO VERIFY Performed at [facility], [address] [place], [place] [address]   TSH     Status: None   Collection Time: 10/18/23  6:27 PM  Result Value Ref Range   TSH 1.979 0.350 - 4.500 uIU/mL    Comment: Performed by a 3rd Generation assay with a functional sensitivity of <=0.01 uIU/mL. Performed at [facility], [address] [place], [place] [address]   HIV Antibody (routine testing w rflx)     Status: None   Collection Time: 10/18/23  6:27 PM  Result Value Ref Range   HIV Screen 4th Generation wRfx Non Reactive Non Reactive    Comment: Performed at [facility], [address], [place], [place] [address]  Hemoglobin A1c      Status: Abnormal   Collection Time: 10/18/23  6:27 PM  Result Value Ref Range   Hgb A1c MFr Bld 6.3 (H) 4.8 - 5.6 %    Comment: (NOTE)         Prediabetes: 5.7 - 6.4         Diabetes: >6.4         Glycemic control for adults with diabetes: <7.0    Mean Plasma Glucose 134 mg/dL    Comment: (NOTE) Performed At: [facility] [address] [place], [place] [id number] Jolene Schimke MD LK:[id number]   CBC     Status: Abnormal   Collection Time: 10/18/23  6:27 PM  Result Value Ref Range   WBC 15.9 (H) 4.0 - 10.5 K/uL   RBC 4.38 3.87 - 5.11 MIL/uL   Hemoglobin 15.1 (H) 12.0 - 15.0 g/dL   HCT 66.4 40.3 - 47.4 %   MCV 94.7 80.0 - 100.0 fL   MCH 34.5 (H) 26.0 - 34.0 pg   MCHC 36.4 (H) 30.0 - 36.0 g/dL   RDW 25.9 56.3 - 87.5 %   Platelets 287 150 - 400 K/uL   nRBC 0.0 0.0 - 0.2 %    Comment: Performed at [facility], [address] [place], [place] [address]  Creatinine, serum     Status: None   Collection Time: 10/18/23  6:27 PM  Result Value Ref Range   Creatinine, Ser 0.72 0.44 - 1.00 mg/dL   GFR, Estimated >95 >18 mL/min    Comment: (NOTE) Calculated using the CKD-EPI Creatinine Equation (2021) Performed at [facility], [address] [place], [place] [address]   Lactic acid, plasma     Status: Abnormal   Collection Time: 10/18/23 10:16 PM  Result Value Ref Range   Lactic Acid, Venous 2.4 (HH) 0.5 - 1.9 mmol/L    Comment: CRITICAL RESULT CALLED TO, READ BACK BY AND VERIFIED WITH Sharyn Dross RN @ [phone] 10/18/23 [facility] Performed at [facility], [address] [place] [address] [place], [place] [address]  Urinalysis, Complete w Microscopic -Urine, Clean Catch     Status: Abnormal   Collection Time: 10/18/23 10:16 PM  Result Value Ref Range   Color, Urine YELLOW (A) YELLOW   APPearance CLEAR (A) CLEAR   Specific Gravity, Urine 1.028 1.005 - 1.030   pH 5.0 5.0 - 8.0   Glucose, UA NEGATIVE NEGATIVE mg/dL   Hgb urine dipstick MODERATE (A) NEGATIVE    Bilirubin Urine NEGATIVE NEGATIVE   Ketones, ur NEGATIVE NEGATIVE mg/dL   Protein, ur 30 (A) NEGATIVE mg/dL   Nitrite NEGATIVE NEGATIVE   Leukocytes,Ua NEGATIVE NEGATIVE   RBC / HPF 0-5 0 - 5 RBC/hpf   WBC, UA 0-5 0 - 5 WBC/hpf   Bacteria, UA NONE SEEN NONE SEEN   Squamous Epithelial / HPF 0-5 0 - 5 /HPF    Comment: Performed at [facility], [address] [place] [address] [place], [place] [address]  Sodium, urine, random     Status: None   Collection Time: 10/18/23 10:16 PM  Result Value Ref Range   Sodium, Ur 18 mmol/L    Comment: Performed at [facility], [address] [place], [place] [address]  Basic metabolic panel     Status: Abnormal   Collection Time: 10/18/23 11:40 PM  Result Value Ref Range   Sodium 129 (L) 135 - 145 mmol/L   Potassium 3.7 3.5 - 5.1 mmol/L   Chloride 95 (L) 98 - 111 mmol/L   CO2 18 (L) 22 - 32 mmol/L   Glucose, Bld 220 (H) 70 - 99 mg/dL    Comment: Glucose reference range applies only to samples taken after fasting for at least 8 hours.   BUN 23 (H) 6 - 20 mg/dL   Creatinine, Ser 0.98 0.44 - 1.00 mg/dL   Calcium 9.4 8.9 - 11.9 mg/dL   GFR, Estimated >14 >78 mL/min    Comment: (NOTE) Calculated using the CKD-EPI Creatinine Equation (2021)    Anion gap 16 (H) 5 - 15    Comment: Performed at [facility], [address] [place], [place] [address]  Magnesium     Status: Abnormal   Collection Time: 10/18/23 11:40 PM  Result Value Ref Range   Magnesium 2.7 (H) 1.7 - 2.4 mg/dL    Comment: Performed at [facility], [address] [place], [place] [address]  CBC     Status: Abnormal   Collection Time: 10/19/23  6:15 AM  Result Value Ref Range   WBC 17.4 (H) 4.0 - 10.5 K/uL   RBC 4.06 3.87 - 5.11 MIL/uL   Hemoglobin 14.0 12.0 - 15.0 g/dL   HCT 57.8 46.9 - 62.9 %   MCV 96.8 80.0 - 100.0 fL   MCH 34.5 (H) 26.0 - 34.0 pg   MCHC 35.6 30.0 - 36.0 g/dL   RDW 52.8 41.3 - 24.4 %   Platelets 255 150 - 400 K/uL   nRBC 0.0 0.0 -  0.2 %    Comment: Performed at [facility], [address], [place], [place] [address]  Comprehensive metabolic panel     Status: Abnormal   Collection Time: 10/19/23  6:15 AM  Result Value Ref Range   Sodium 129 (L) 135 - 145 mmol/L   Potassium 3.8 3.5 - 5.1 mmol/L   Chloride 92 (L) 98 - 111 mmol/L   CO2 25 22 - 32 mmol/L   Glucose, Bld 192 (H) 70 - 99 mg/dL    Comment: Glucose reference range applies only to samples taken  after fasting for at least 8 hours.   BUN 15 6 - 20 mg/dL   Creatinine, Ser 1.61 0.44 - 1.00 mg/dL   Calcium 9.2 8.9 - 09.6 mg/dL   Total Protein 7.2 6.5 - 8.1 g/dL   Albumin 3.7 3.5 - 5.0 g/dL   AST 27 15 - 41 U/L   ALT 13 0 - 44 U/L   Alkaline Phosphatase 79 38 - 126 U/L   Total Bilirubin 1.4 (H) 0.0 - 1.2 mg/dL   GFR, Estimated >04 >54 mL/min    Comment: (NOTE) Calculated using the CKD-EPI Creatinine Equation (2021)    Anion gap 12 5 - 15    Comment: Performed at [facility], [address] [place], [place] [address]  Protime-INR     Status: None   Collection Time: 10/19/23  6:15 AM  Result Value Ref Range   Prothrombin Time 14.0 11.4 - 15.2 seconds   INR 1.1 0.8 - 1.2    Comment: (NOTE) INR goal varies based on device and disease states. Performed at [facility], [address] [place], [place] [address]   Lactic acid, plasma     Status: None   Collection Time: 10/19/23  6:15 AM  Result Value Ref Range   Lactic Acid, Venous 1.7 0.5 - 1.9 mmol/L    Comment: Performed at [facility], [address] [place], [place] [address]  Sodium     Status: Abnormal   Collection Time: 10/19/23  4:40 PM  Result Value Ref Range   Sodium 131 (L) 135 - 145 mmol/L    Comment: Performed at [facility], [address] [place], [place] [address]  CBC     Status: Abnormal   Collection Time: 10/20/23  5:07 AM  Result Value Ref Range   WBC 12.7 (H) 4.0 - 10.5 K/uL   RBC 3.72 (L) 3.87 - 5.11 MIL/uL   Hemoglobin  12.8 12.0 - 15.0 g/dL   HCT 57.8 46.9 - 62.9 %   MCV 100.3 (H) 80.0 - 100.0 fL   MCH 34.4 (H) 26.0 - 34.0 pg   MCHC 34.3 30.0 - 36.0 g/dL   RDW 52.8 41.3 - 24.4 %   Platelets 238 150 - 400 K/uL   nRBC 0.0 0.0 - 0.2 %    Comment: Performed at [facility], [address] [place] [address] [place], [place] [address]  Lipase, blood     Status: Abnormal   Collection Time: 10/20/23  5:07 AM  Result Value Ref Range   Lipase 55 (H) 11 - 51 U/L    Comment: Performed at [facility], [address] [place], [place] [address]  Basic metabolic panel     Status: Abnormal   Collection Time: 10/20/23  5:07 AM  Result Value Ref Range   Sodium 132 (L) 135 - 145 mmol/L   Potassium 3.6 3.5 - 5.1 mmol/L    Comment: HEMOLYSIS AT THIS LEVEL MAY AFFECT RESULT   Chloride 95 (L) 98 - 111 mmol/L   CO2 23 22 - 32 mmol/L   Glucose, Bld 164 (H) 70 - 99 mg/dL    Comment: Glucose reference range applies only to samples taken after fasting for at least 8 hours.   BUN 13 6 - 20 mg/dL   Creatinine, Ser 4.40 0.44 - 1.00 mg/dL   Calcium 9.3 8.9 - 34.7 mg/dL   GFR, Estimated >42 >59 mL/min    Comment: (NOTE) Calculated using the CKD-EPI Creatinine Equation (2021)  Anion gap 14 5 - 15    Comment: Performed at [facility], [address] [place], [place] [address]  Basic metabolic panel     Status: Abnormal   Collection Time: 10/21/23  6:55 AM  Result Value Ref Range   Sodium 133 (L) 135 - 145 mmol/L   Potassium 4.0 3.5 - 5.1 mmol/L   Chloride 100 98 - 111 mmol/L   CO2 20 (L) 22 - 32 mmol/L   Glucose, Bld 158 (H) 70 - 99 mg/dL    Comment: Glucose reference range applies only to samples taken after fasting for at least 8 hours.   BUN 9 6 - 20 mg/dL   Creatinine, Ser 6.04 0.44 - 1.00 mg/dL   Calcium 9.1 8.9 - 54.0 mg/dL   GFR, Estimated >98 >11 mL/min    Comment: (NOTE) Calculated using the CKD-EPI Creatinine Equation (2021)    Anion gap 13 5 - 15    Comment: Performed at [facility], [address] [place], [place] [address]  Magnesium     Status: None   Collection Time: 10/21/23  6:55 AM  Result Value Ref Range   Magnesium 2.3 1.7 - 2.4 mg/dL    Comment: Performed at [facility], [address] [place], [place] [address]  CBC     Status: Abnormal   Collection Time: 10/21/23  7:48 AM  Result Value Ref Range   WBC 11.1 (H) 4.0 - 10.5 K/uL   RBC 3.56 (L) 3.87 - 5.11 MIL/uL   Hemoglobin 12.1 12.0 - 15.0 g/dL   HCT 13.0 (L) 86.5 - 78.4 %   MCV 99.7 80.0 - 100.0 fL   MCH 34.0 26.0 - 34.0 pg   MCHC 34.1 30.0 - 36.0 g/dL   RDW 69.6 29.5 - 28.4 %   Platelets 334 150 - 400 K/uL   nRBC 0.0 0.0 - 0.2 %    Comment: Performed at [facility], [address] [place], [place] [address]  Basic metabolic panel     Status: Abnormal   Collection Time: 10/22/23 11:42 AM  Result Value Ref Range   Sodium 129 (L) 135 - 145 mmol/L   Potassium 4.4 3.5 - 5.1 mmol/L   Chloride 91 (L) 98 - 111 mmol/L   CO2 25 22 - 32 mmol/L   Glucose, Bld 211 (H) 70 - 99 mg/dL    Comment: Glucose reference range applies only to samples taken after fasting for at least 8 hours.   BUN 9 6 - 20 mg/dL   Creatinine, Ser 0.10 0.44 - 1.00 mg/dL   Calcium 9.6 8.9 - 27.2 mg/dL   GFR, Estimated >53 >66 mL/min    Comment: (NOTE) Calculated using the CKD-EPI Creatinine Equation (2021)    Anion gap 13 5 - 15    Comment: Performed at [facility], [address] [place], [place] [address]  CBC     Status: Abnormal   Collection Time: 10/22/23 11:42 AM  Result Value Ref Range   WBC 5.5 4.0 - 10.5 K/uL   RBC 3.42 (L) 3.87 - 5.11 MIL/uL   Hemoglobin 11.7 (L) 12.0 - 15.0 g/dL   HCT 74.2 (L) 59.5 - 63.8 %   MCV 100.3 (H) 80.0 - 100.0 fL   MCH 34.2 (H) 26.0 - 34.0 pg   MCHC 34.1 30.0 - 36.0 g/dL   RDW 75.6 43.3 - 29.5 %   Platelets 260 150 - 400 K/uL   nRBC 0.0 0.0 -  0.2 %    Comment: Performed at [facility], [address] [place], [place]  [address]  Magnesium     Status: None   Collection Time: 10/22/23 11:42 AM  Result Value Ref Range   Magnesium 2.2 1.7 - 2.4 mg/dL    Comment: Performed at [facility], [address] [place], [place] [address]  Lactic acid, plasma     Status: None   Collection Time: 10/22/23  7:28 PM  Result Value Ref Range   Lactic Acid, Venous 1.1 0.5 - 1.9 mmol/L    Comment: Performed at [facility], [address] [place], [place] [address]  Basic metabolic panel     Status: Abnormal   Collection Time: 10/23/23  6:36 AM  Result Value Ref Range   Sodium 126 (L) 135 - 145 mmol/L   Potassium 4.1 3.5 - 5.1 mmol/L   Chloride 91 (L) 98 - 111 mmol/L   CO2 23 22 - 32 mmol/L   Glucose, Bld 259 (H) 70 - 99 mg/dL    Comment: Glucose reference range applies only to samples taken after fasting for at least 8 hours.   BUN 6 6 - 20 mg/dL   Creatinine, Ser 9.14 0.44 - 1.00 mg/dL   Calcium 8.8 (L) 8.9 - 10.3 mg/dL   GFR, Estimated >78 >29 mL/min    Comment: (NOTE) Calculated using the CKD-EPI Creatinine Equation (2021)    Anion gap 12 5 - 15    Comment: Performed at [facility], [address] [place], [place] [address]  CBC     Status: Abnormal   Collection Time: 10/23/23  6:36 AM  Result Value Ref Range   WBC 9.3 4.0 - 10.5 K/uL   RBC 3.10 (L) 3.87 - 5.11 MIL/uL   Hemoglobin 10.6 (L) 12.0 - 15.0 g/dL   HCT 08.6 (L) 57.8 - 46.9 %   MCV 101.0 (H) 80.0 - 100.0 fL   MCH 34.2 (H) 26.0 - 34.0 pg   MCHC 33.9 30.0 - 36.0 g/dL   RDW 62.9 52.8 - 41.3 %   Platelets 227 150 - 400 K/uL   nRBC 0.0 0.0 - 0.2 %    Comment: Performed at [facility], [address], [place], [place] [address]  Magnesium     Status: None   Collection Time: 10/23/23  6:36 AM  Result Value Ref Range   Magnesium 1.7 1.7 - 2.4 mg/dL    Comment: Performed at [facility], [address] [place], [place] [address]  Culture, blood (Routine X 2) w Reflex to ID Panel     Status:  Abnormal   Collection Time: 10/23/23  9:11 AM   Specimen: BLOOD  Result Value Ref Range   Specimen Description      BLOOD LH Performed at [facility], [address], [place], [place] [address]    Special Requests      BOTTLES DRAWN AEROBIC AND ANAEROBIC Blood Culture adequate volume Performed at [facility], [address] [place], [place] [address]    Culture  Setup Time      GRAM POSITIVE COCCI IN BOTH AEROBIC AND ANAEROBIC BOTTLES CRITICAL RESULT CALLED TO, READ BACK BY AND VERIFIED WITH: WILL ANDERSON @2010  ON 10/23/23 SKL    Culture (A)     METHICILLIN RESISTANT STAPHYLOCOCCUS AUREUS SEE SEPARATE REPORT FOR DAPTOMYCIN RESULT Performed at [facility], [address], [place], [place] [address]    Report Status 11/03/2023 FINAL  Organism ID, Bacteria METHICILLIN RESISTANT STAPHYLOCOCCUS AUREUS       Susceptibility   Methicillin resistant staphylococcus aureus - MIC*    CIPROFLOXACIN <=0.5 SENSITIVE Sensitive     ERYTHROMYCIN >=8 RESISTANT Resistant     GENTAMICIN <=0.5 SENSITIVE Sensitive     OXACILLIN >=4 RESISTANT Resistant     TETRACYCLINE <=1 SENSITIVE Sensitive     VANCOMYCIN 1 SENSITIVE Sensitive     TRIMETH/SULFA <=10 SENSITIVE Sensitive     CLINDAMYCIN <=0.25 SENSITIVE Sensitive     RIFAMPIN <=0.5 SENSITIVE Sensitive     Inducible Clindamycin NEGATIVE Sensitive     LINEZOLID 2 SENSITIVE Sensitive     * METHICILLIN RESISTANT STAPHYLOCOCCUS AUREUS  Culture, blood (Routine X 2) w Reflex to ID Panel     Status: Abnormal   Collection Time: 10/23/23  9:11 AM   Specimen: BLOOD  Result Value Ref Range   Specimen Description      BLOOD RH Performed at [facility], [address] [place], [place] [address]    Special Requests      BOTTLES DRAWN AEROBIC AND ANAEROBIC Blood Culture adequate volume Performed at [facility], [address] [place], [place] [address]    Culture  Setup Time      GRAM POSITIVE  COCCI IN BOTH AEROBIC AND ANAEROBIC BOTTLES CRITICAL VALUE NOTED.  VALUE IS CONSISTENT WITH PREVIOUSLY REPORTED AND CALLED VALUE. SKL GRAM STAIN REVIEWED-AGREE WITH RESULT DRT    Culture (A)     STAPHYLOCOCCUS AUREUS SUSCEPTIBILITIES PERFORMED ON PREVIOUS CULTURE WITHIN THE LAST 5 DAYS. Performed at [facility], [address] [place], [place] [address]    Report Status 10/26/2023 FINAL   Procalcitonin     Status: None   Collection Time: 10/23/23  9:11 AM  Result Value Ref Range   Procalcitonin 0.88 ng/mL    Comment:        Interpretation: PCT > 0.5 ng/mL and <= 2 ng/mL: Systemic infection (sepsis) is possible, but other conditions are known to elevate PCT as well. (NOTE)       Sepsis PCT Algorithm           Lower Respiratory Tract                                      Infection PCT Algorithm    ----------------------------     ----------------------------         PCT < 0.25 ng/mL                PCT < 0.10 ng/mL          Strongly encourage             Strongly discourage   discontinuation of antibiotics    initiation of antibiotics    ----------------------------     -----------------------------       PCT 0.25 - 0.50 ng/mL            PCT 0.10 - 0.25 ng/mL               OR       >80% decrease in PCT            Discourage initiation of  antibiotics      Encourage discontinuation           of antibiotics    ----------------------------     -----------------------------         PCT >= 0.50 ng/mL              PCT 0.26 - 0.50 ng/mL                AND       <80% decrease in PCT             Encourage initiation of                                             antibiotics       Encourage continuation           of antibiotics    ----------------------------     -----------------------------        PCT >= 0.50 ng/mL                  PCT > 0.50 ng/mL               AND         increase in PCT                  Strongly encourage                                       initiation of antibiotics    Strongly encourage escalation           of antibiotics                                     -----------------------------                                           PCT <= 0.25 ng/mL                                                 OR                                        > 80% decrease in PCT                                      Discontinue / Do not initiate                                             antibiotics  Performed at [facility], [address] [place], [place] [address]   Blood Culture ID Panel (Reflexed)     Status: Abnormal  Collection Time: 10/23/23  9:11 AM  Result Value Ref Range   Enterococcus faecalis NOT DETECTED NOT DETECTED   Enterococcus Faecium NOT DETECTED NOT DETECTED   Listeria monocytogenes NOT DETECTED NOT DETECTED   Staphylococcus species DETECTED (A) NOT DETECTED    Comment: CRITICAL RESULT CALLED TO, READ BACK BY AND VERIFIED WITH: WILL ANDERSON @2010  ON 10/23/23 SKL    Staphylococcus aureus (BCID) DETECTED (A) NOT DETECTED    Comment: Methicillin (oxacillin)-resistant Staphylococcus aureus (MRSA). MRSA is predictably resistant to beta-lactam antibiotics (except ceftaroline). Preferred therapy is vancomycin unless clinically contraindicated. Patient requires contact precautions if  hospitalized. CRITICAL RESULT CALLED TO, READ BACK BY AND VERIFIED WITH: WILL ANDERSON @2010  ON 10/23/23 SKL    Staphylococcus epidermidis NOT DETECTED NOT DETECTED   Staphylococcus lugdunensis NOT DETECTED NOT DETECTED   Streptococcus species NOT DETECTED NOT DETECTED   Streptococcus agalactiae NOT DETECTED NOT DETECTED   Streptococcus pneumoniae NOT DETECTED NOT DETECTED   Streptococcus pyogenes NOT DETECTED NOT DETECTED   A.calcoaceticus-baumannii NOT DETECTED NOT DETECTED   Bacteroides fragilis NOT DETECTED NOT DETECTED   Enterobacterales NOT DETECTED NOT DETECTED   Enterobacter cloacae complex NOT  DETECTED NOT DETECTED   Escherichia coli NOT DETECTED NOT DETECTED   Klebsiella aerogenes NOT DETECTED NOT DETECTED   Klebsiella oxytoca NOT DETECTED NOT DETECTED   Klebsiella pneumoniae NOT DETECTED NOT DETECTED   Proteus species NOT DETECTED NOT DETECTED   Salmonella species NOT DETECTED NOT DETECTED   Serratia marcescens NOT DETECTED NOT DETECTED   Haemophilus influenzae NOT DETECTED NOT DETECTED   Neisseria meningitidis NOT DETECTED NOT DETECTED   Pseudomonas aeruginosa NOT DETECTED NOT DETECTED   Stenotrophomonas maltophilia NOT DETECTED NOT DETECTED   Candida albicans NOT DETECTED NOT DETECTED   Candida auris NOT DETECTED NOT DETECTED   Candida glabrata NOT DETECTED NOT DETECTED   Candida krusei NOT DETECTED NOT DETECTED   Candida parapsilosis NOT DETECTED NOT DETECTED   Candida tropicalis NOT DETECTED NOT DETECTED   Cryptococcus neoformans/gattii NOT DETECTED NOT DETECTED   Meth resistant mecA/C and MREJ DETECTED (A) NOT DETECTED    Comment: CRITICAL RESULT CALLED TO, READ BACK BY AND VERIFIED WITH: WILL ANDERSON @2010  ON 10/23/23 SKL Performed at [facility], [address] [place], [place] [address]   MIC (1 Drug)-blood culture; 10/23/2023; BLOOD RIGHT HAND; MRSA; Daptomycin     Status: Abnormal   Collection Time: 10/23/23  9:11 AM   Specimen: BLOOD RIGHT HAND  Result Value Ref Range   Min Inhibitory Conc (1 Drug) Final report (A)     Comment: (NOTE) Performed At: [facility] [address] [place], [place] [id number] Jolene Schimke MD [id number]    Source BLOOD     Comment: Performed at [facility], [address] [place], [place] [address]  MIC Result     Status: Abnormal   Collection Time: 10/23/23  9:11 AM  Result Value Ref Range   Result 1 (MIC) Comment (A)     Comment: (NOTE) Methicillin - resistant Staphylococcus aureus Identification performed by account, not confirmed by this laboratory. Testing performed by broth  microdilution. DAPTOMYCIN   0.5 ug/mL SUSCEPTIBLE Performed At: [facility] [address] [place], [place] [id number] Jolene Schimke MD [id number]   Urinalysis, Complete w Microscopic -Urine, Clean Catch     Status: Abnormal   Collection Time: 10/23/23 11:34 AM  Result Value Ref Range   Color, Urine YELLOW (A) YELLOW   APPearance HAZY (A) CLEAR   Specific Gravity,  Urine 1.015 1.005 - 1.030   pH 6.0 5.0 - 8.0   Glucose, UA >=500 (A) NEGATIVE mg/dL   Hgb urine dipstick SMALL (A) NEGATIVE   Bilirubin Urine NEGATIVE NEGATIVE   Ketones, ur NEGATIVE NEGATIVE mg/dL   Protein, ur 30 (A) NEGATIVE mg/dL   Nitrite NEGATIVE NEGATIVE   Leukocytes,Ua NEGATIVE NEGATIVE   RBC / HPF 0 0 - 5 RBC/hpf   WBC, UA 0-5 0 - 5 WBC/hpf   Bacteria, UA RARE (A) NONE SEEN   Squamous Epithelial / HPF 0-5 0 - 5 /HPF    Comment: Performed at [facility], [address] [place], [place] [address]  Basic metabolic panel     Status: Abnormal   Collection Time: 10/24/23  5:30 AM  Result Value Ref Range   Sodium 125 (L) 135 - 145 mmol/L   Potassium 3.5 3.5 - 5.1 mmol/L   Chloride 93 (L) 98 - 111 mmol/L   CO2 21 (L) 22 - 32 mmol/L   Glucose, Bld 244 (H) 70 - 99 mg/dL    Comment: Glucose reference range applies only to samples taken after fasting for at least 8 hours.   BUN 6 6 - 20 mg/dL   Creatinine, Ser 7.82 0.44 - 1.00 mg/dL   Calcium 8.8 (L) 8.9 - 10.3 mg/dL   GFR, Estimated >95 >62 mL/min    Comment: (NOTE) Calculated using the CKD-EPI Creatinine Equation (2021)    Anion gap 11 5 - 15    Comment: Performed at [facility], [address] [place], [place] [address]  CBC     Status: Abnormal   Collection Time: 10/24/23  5:30 AM  Result Value Ref Range   WBC 9.4 4.0 - 10.5 K/uL   RBC 2.93 (L) 3.87 - 5.11 MIL/uL   Hemoglobin 10.0 (L) 12.0 - 15.0 g/dL   HCT 57.8 (L) 46.9 - 62.9 %   MCV 100.3 (H) 80.0 - 100.0 fL   MCH 34.1 (H) 26.0 - 34.0 pg   MCHC 34.0 30.0 - 36.0  g/dL   RDW 52.8 41.3 - 24.4 %   Platelets 201 150 - 400 K/uL   nRBC 0.0 0.0 - 0.2 %    Comment: Performed at [facility], [address], [place], [place] [address]  Magnesium     Status: Abnormal   Collection Time: 10/24/23  5:30 AM  Result Value Ref Range   Magnesium 1.6 (L) 1.7 - 2.4 mg/dL    Comment: Performed at [facility], [address] [place], [place] [address]  ECHOCARDIOGRAM COMPLETE     Status: None   Collection Time: 10/24/23  3:15 PM  Result Value Ref Range   Weight 1,920 oz   Height 60 in   BP 121/87 mmHg   Ao pk vel 1.44 m/s   AV Area VTI 2.64 cm2   AR max vel 2.75 cm2   AV Mean grad 5.0 mmHg   AV Peak grad 8.3 mmHg   S' Lateral 2.80 cm   AV Area mean vel 2.48 cm2   Area-P 1/2 6.12 cm2   MV VTI 2.97 cm2   Est EF 60 - 65%   Basic metabolic panel     Status: Abnormal   Collection Time: 10/25/23  4:44 AM  Result Value Ref Range   Sodium 126 (L) 135 - 145 mmol/L   Potassium 3.3 (L) 3.5 - 5.1 mmol/L   Chloride 95 (L) 98 - 111 mmol/L   CO2 23 22 -  32 mmol/L   Glucose, Bld 297 (H) 70 - 99 mg/dL    Comment: Glucose reference range applies only to samples taken after fasting for at least 8 hours.   BUN <5 (L) 6 - 20 mg/dL   Creatinine, Ser 0.98 0.44 - 1.00 mg/dL   Calcium 9.0 8.9 - 11.9 mg/dL   GFR, Estimated >14 >78 mL/min    Comment: (NOTE) Calculated using the CKD-EPI Creatinine Equation (2021)    Anion gap 8 5 - 15    Comment: Performed at [facility], [address] [place], [place] [address]  CBC     Status: Abnormal   Collection Time: 10/25/23  4:44 AM  Result Value Ref Range   WBC 12.0 (H) 4.0 - 10.5 K/uL   RBC 2.80 (L) 3.87 - 5.11 MIL/uL   Hemoglobin 9.5 (L) 12.0 - 15.0 g/dL   HCT 13.0 (L) 86.5 - 78.4 %   MCV 97.1 80.0 - 100.0 fL   MCH 33.9 26.0 - 34.0 pg   MCHC 34.9 30.0 - 36.0 g/dL   RDW 69.6 29.5 - 28.4 %   Platelets 183 150 - 400 K/uL   nRBC 0.0 0.0 - 0.2 %    Comment: Performed at [facility],  [address], [place], [place] [address]  Magnesium     Status: None   Collection Time: 10/25/23  4:44 AM  Result Value Ref Range   Magnesium 1.7 1.7 - 2.4 mg/dL    Comment: Performed at [facility], [address], [place], [place] [address]  Phosphorus     Status: Abnormal   Collection Time: 10/25/23  4:44 AM  Result Value Ref Range   Phosphorus 2.2 (L) 2.5 - 4.6 mg/dL    Comment: Performed at [facility], [address], [place], [place] [address]  Uric acid     Status: None   Collection Time: 10/25/23  4:44 AM  Result Value Ref Range   Uric Acid, Serum 2.9 2.5 - 7.1 mg/dL    Comment: HEMOLYSIS AT THIS LEVEL MAY AFFECT RESULT Performed at [facility], [address] [place], [place] [address]   Culture, blood (Routine X 2) w Reflex to ID Panel     Status: Abnormal   Collection Time: 10/25/23  4:53 PM   Specimen: BLOOD  Result Value Ref Range   Specimen Description      BLOOD BLOOD RIGHT HAND Performed at [facility], [address] [place], [place] [address]    Special Requests      BOTTLES DRAWN AEROBIC AND ANAEROBIC Blood Culture results may not be optimal due to an inadequate volume of blood received in culture bottles Performed at [facility], [address] [place], [place] [address]    Culture  Setup Time      GRAM POSITIVE COCCI AEROBIC BOTTLE ONLY CRITICAL RESULT CALLED TO, READ BACK BY AND VERIFIED WITH: P/C WILL ANDERSON @ 2010 10/23/23 SKL    Culture (A)     STAPHYLOCOCCUS AUREUS SUSCEPTIBILITIES PERFORMED ON PREVIOUS CULTURE WITHIN THE LAST 5 DAYS. Performed at [facility], [address] [place], [place] [address]    Report Status 10/27/2023 FINAL   Culture, blood (Routine X 2) w Reflex to ID Panel     Status: Abnormal   Collection Time: 10/25/23  4:53 PM   Specimen: BLOOD  Result Value Ref Range   Specimen Description      BLOOD BLOOD RIGHT ARM Performed at  [facility], [address], [place], [place] [address]    Special Requests      BOTTLES DRAWN AEROBIC ONLY Blood Culture results may not be optimal due to an inadequate volume of blood received in culture bottles Performed at [facility], [address] [place], [place] [address]    Culture  Setup Time      GRAM POSITIVE COCCI AEROBIC BOTTLE ONLY CRITICAL VALUE NOTED.  VALUE IS CONSISTENT WITH PREVIOUSLY REPORTED AND CALLED VALUE.    Culture (A)     STAPHYLOCOCCUS AUREUS SUSCEPTIBILITIES PERFORMED ON PREVIOUS CULTURE WITHIN THE LAST 5 DAYS. Performed at [facility], [address] [place], [place] [address]    Report Status 10/27/2023 FINAL   Basic metabolic panel     Status: Abnormal   Collection Time: 10/26/23  8:19 AM  Result Value Ref Range   Sodium 127 (L) 135 - 145 mmol/L   Potassium 2.7 (LL) 3.5 - 5.1 mmol/L    Comment: CRITICAL RESULT CALLED TO, READ BACK BY AND VERIFIED WITH TENISHA RAGLANDCOLVIN @0923  10/26/23 MJU    Chloride 93 (L) 98 - 111 mmol/L   CO2 23 22 - 32 mmol/L   Glucose, Bld 412 (H) 70 - 99 mg/dL    Comment: Glucose reference range applies only to samples taken after fasting for at least 8 hours.   BUN <5 (L) 6 - 20 mg/dL   Creatinine, Ser 9.14 0.44 - 1.00 mg/dL   Calcium 8.6 (L) 8.9 - 10.3 mg/dL   GFR, Estimated >78 >29 mL/min    Comment: (NOTE) Calculated using the CKD-EPI Creatinine Equation (2021)    Anion gap 11 5 - 15    Comment: Performed at [facility], [address] [place], [place] [address]  CBC     Status: Abnormal   Collection Time: 10/26/23  8:19 AM  Result Value Ref Range   WBC 11.0 (H) 4.0 - 10.5 K/uL   RBC 2.81 (L) 3.87 - 5.11 MIL/uL   Hemoglobin 9.4 (L) 12.0 - 15.0 g/dL   HCT 08.6 (L) 57.8 - 46.9 %   MCV 98.6 80.0 - 100.0 fL   MCH 33.5 26.0 - 34.0 pg   MCHC 33.9 30.0 - 36.0 g/dL   RDW 62.9 52.8 - 41.3 %   Platelets 211 150 - 400 K/uL   nRBC 0.0 0.0 - 0.2 %    Comment: Performed at [facility], [address], [place], [place] [address]  Magnesium     Status: None   Collection Time: 10/26/23  8:19 AM  Result Value Ref Range   Magnesium 1.8 1.7 - 2.4 mg/dL    Comment: Performed at [facility], [address], [place], [place] [address]  Phosphorus     Status: None   Collection Time: 10/26/23  8:19 AM  Result Value Ref Range   Phosphorus 3.5 2.5 - 4.6 mg/dL    Comment: Performed at [facility], [address] [place], [place] [address]  Lipase, blood     Status: None   Collection Time: 10/26/23  8:19 AM  Result Value Ref Range   Lipase 23 11 - 51 U/L    Comment: Performed at [facility], [address] [place], [place] [address]  Culture, blood (Routine X 2) w Reflex to ID Panel     Status: Abnormal   Collection Time: 10/26/23  1:54 PM   Specimen: BLOOD RIGHT ARM  Result Value Ref Range  Specimen Description      BLOOD RIGHT ARM Performed at [facility], [address] [place], [place] [address]    Special Requests      BOTTLES DRAWN AEROBIC AND ANAEROBIC Blood Culture adequate volume Performed at [facility], [address] [place], [place] [address]    Culture  Setup Time      GRAM POSITIVE COCCI ANAEROBIC BOTTLE ONLY CRITICAL VALUE NOTED.  VALUE IS CONSISTENT WITH PREVIOUSLY REPORTED AND CALLED VALUE. GRAM STAIN REVIEWED-AGREE WITH RESULT DRT    Culture (A)     STAPHYLOCOCCUS AUREUS SUSCEPTIBILITIES PERFORMED ON PREVIOUS CULTURE WITHIN THE LAST 5 DAYS. Performed at [facility], [address] [place], [place] [address]    Report Status 10/29/2023 FINAL   Culture, blood (Routine X 2) w Reflex to ID Panel     Status: Abnormal   Collection Time: 10/26/23  2:00 PM   Specimen: BLOOD RIGHT FOREARM  Result Value Ref Range   Specimen Description      BLOOD RIGHT FOREARM Performed at [facility], [address], [place], [place] [address]    Special Requests      BOTTLES DRAWN AEROBIC AND ANAEROBIC  Blood Culture results may not be optimal due to an inadequate volume of blood received in culture bottles Performed at [facility], [address] [place], [place] [address]    Culture  Setup Time      GRAM POSITIVE COCCI IN BOTH AEROBIC AND ANAEROBIC BOTTLES GRAM STAIN REVIEWED-AGREE WITH RESULT DRT    Culture (A)     STAPHYLOCOCCUS AUREUS SUSCEPTIBILITIES PERFORMED ON PREVIOUS CULTURE WITHIN THE LAST 5 DAYS. Performed at [facility], [address], [place], [place] [address]    Report Status 10/29/2023 FINAL   Glucose, capillary     Status: Abnormal   Collection Time: 10/26/23  2:56 PM  Result Value Ref Range   Glucose-Capillary 412 (H) 70 - 99 mg/dL    Comment: Glucose reference range applies only to samples taken after fasting for at least 8 hours.  Basic metabolic panel     Status: Abnormal   Collection Time: 10/26/23  3:44 PM  Result Value Ref Range   Sodium 125 (L) 135 - 145 mmol/L   Potassium 3.3 (L) 3.5 - 5.1 mmol/L   Chloride 92 (L) 98 - 111 mmol/L   CO2 24 22 - 32 mmol/L   Glucose, Bld 400 (H) 70 - 99 mg/dL    Comment: Glucose reference range applies only to samples taken after fasting for at least 8 hours.   BUN <5 (L) 6 - 20 mg/dL   Creatinine, Ser <4.16 (L) 0.44 - 1.00 mg/dL   Calcium 8.6 (L) 8.9 - 10.3 mg/dL   GFR, Estimated NOT CALCULATED >60 mL/min    Comment: (NOTE) Calculated using the CKD-EPI Creatinine Equation (2021)    Anion gap 9 5 - 15    Comment: Performed at [facility], [address] [place], [place] [address]  Glucose, capillary     Status: Abnormal   Collection Time: 10/26/23 10:15 PM  Result Value Ref Range   Glucose-Capillary 183 (H) 70 - 99 mg/dL    Comment: Glucose reference range applies only to samples taken after fasting for at least 8 hours.   Comment 1 Notify RN   Surgical pathology     Status: None   Collection Time: 10/27/23 12:00 AM  Result Value Ref Range   SURGICAL PATHOLOGY  SURGICAL  PATHOLOGY [facility] [address] [place], [place] [address] Telephone [phone] or [phone] Fax [phone]  REPORT OF SURGICAL PATHOLOGY   Accession #: [id number] Patient Name: CONSTANCE, WHITTLE Visit # : [id number]  MRN: [id number] Physician: [name] DOB/Age [birth year] (Age: 79) Gender: F Collected Date: 10/27/2023 Received Date: 10/27/2023  FINAL DIAGNOSIS       1. Bone, biopsy, Left first metatarsal head :       - ACUTE OSTEOMYELITIS.       DATE SIGNED OUT: 10/28/2023 ELECTRONIC SIGNATURE : Oneita Kras Md, Delice Bison , Pathologist, Electronic Signature  MICROSCOPIC DESCRIPTION  CASE COMMENTS STAINS USED IN DIAGNOSIS: H&E    CLINICAL HISTORY  SPECIMEN(S) OBTAINED 1. Bone, biopsy, Left First Metatarsal Head  SPECIMEN COMMENTS: SPECIMEN CLINICAL INFORMATION: 1. left foot septic arthritis    Gross Description 1. "Bone 1st metatarsal head", received fresh and placed in formalin is a 1. 1 cm long pink-tan bone core with a 0.2 cm in diameter. The specimen is submitted in toto in 1 block (1A) and palced in decalcification solution.      [facility] 10/27/2023        Report signed out from the following location(s) [facility] [address] [place], [place] [address] CLIA #: [id number]  [facility] [address] [place], [place] [address] CLIA #: [id number]   Basic metabolic panel     Status: Abnormal   Collection Time: 10/27/23  4:35 AM  Result Value Ref Range   Sodium 131 (L) 135 - 145 mmol/L   Potassium 3.4 (L) 3.5 - 5.1 mmol/L   Chloride 93 (L) 98 - 111 mmol/L   CO2 23 22 - 32 mmol/L   Glucose, Bld 200 (H) 70 - 99 mg/dL    Comment: Glucose reference range applies only to samples taken after fasting for at least 8 hours.   BUN 5 (L) 6 - 20 mg/dL   Creatinine, Ser 2.35 (L) 0.44 - 1.00 mg/dL   Calcium 8.9 8.9 - 57.3 mg/dL   GFR, Estimated >22 >02 mL/min    Comment:  (NOTE) Calculated using the CKD-EPI Creatinine Equation (2021)    Anion gap 15 5 - 15    Comment: Performed at [facility], [address] [place], [place] [address]  CBC     Status: Abnormal   Collection Time: 10/27/23  4:35 AM  Result Value Ref Range   WBC 16.4 (H) 4.0 - 10.5 K/uL   RBC 3.28 (L) 3.87 - 5.11 MIL/uL   Hemoglobin 10.9 (L) 12.0 - 15.0 g/dL   HCT 62.3 (L) 76.2 - 83.1 %   MCV 95.1 80.0 - 100.0 fL   MCH 33.2 26.0 - 34.0 pg   MCHC 34.9 30.0 - 36.0 g/dL   RDW 51.7 61.6 - 07.3 %   Platelets 278 150 - 400 K/uL   nRBC 0.0 0.0 - 0.2 %    Comment: Performed at [facility], [address], [place], [place] [address]  Magnesium     Status: None   Collection Time: 10/27/23  4:35 AM  Result Value Ref Range   Magnesium 1.8 1.7 - 2.4 mg/dL    Comment: Performed at [facility], [address], [place], [place] [address]  Phosphorus     Status: None   Collection Time: 10/27/23  4:35 AM  Result Value Ref Range   Phosphorus 3.0 2.5 - 4.6 mg/dL    Comment: Performed  at [facility], [address] [place], [place] [address]  Vancomycin, peak     Status: Abnormal   Collection Time: 10/27/23  4:35 AM  Result Value Ref Range   Vancomycin Pk 4 (L) 30 - 40 ug/mL    Comment: Performed at [facility], [address] [place], [place] [address]  CK     Status: Abnormal   Collection Time: 10/27/23  4:35 AM  Result Value Ref Range   Total CK 35 (L) 38 - 234 U/L    Comment: Performed at [facility], [address] [place], [place] [address]  Glucose, capillary     Status: Abnormal   Collection Time: 10/27/23  7:33 AM  Result Value Ref Range   Glucose-Capillary 190 (H) 70 - 99 mg/dL    Comment: Glucose reference range applies only to samples taken after fasting for at least 8 hours.  Vancomycin, peak     Status: Abnormal   Collection Time: 10/27/23  8:11 AM  Result Value Ref Range   Vancomycin Pk <4 (L) 30 - 40 ug/mL     Comment: RESULT CONFIRMED BY MANUAL DILUTION MW Performed at [facility], [address] [place], [place] [address]   Glucose, capillary     Status: Abnormal   Collection Time: 10/27/23 11:52 AM  Result Value Ref Range   Glucose-Capillary 173 (H) 70 - 99 mg/dL    Comment: Glucose reference range applies only to samples taken after fasting for at least 8 hours.  Pregnancy, urine POC     Status: None   Collection Time: 10/27/23 12:44 PM  Result Value Ref Range   Preg Test, Ur NEGATIVE NEGATIVE    Comment:        THE SENSITIVITY OF THIS METHODOLOGY IS >24 mIU/mL   Aerobic/Anaerobic Culture w Gram Stain (surgical/deep wound)     Status: None   Collection Time: 10/27/23  1:18 PM   Specimen: Joint, Other; Body Fluid  Result Value Ref Range   Specimen Description      FLUID Performed at [facility], [address] [place], [place] [address]    Special Requests       JOINT OTHER LEFT FOOT SEPTIC ARTHRITIS Performed at [facility], [address] [place], [place] [address]    Gram Stain NO WBC SEEN NO ORGANISMS SEEN     Culture      No growth aerobically or anaerobically. Performed at [facility], [address], [place], [place] [address]    Report Status 11/01/2023 FINAL   Aerobic/Anaerobic Culture w Gram Stain (surgical/deep wound)     Status: None   Collection Time: 10/27/23  1:45 PM   Specimen: Bone; Tissue  Result Value Ref Range   Specimen Description      TISSUE Performed at [facility], [address], [place], [place] [address]    Special Requests       BONE LEFT FOOT SEPTIC ARTHRITIS Performed at [facility], [address] [place], [place] [address]    Gram Stain NO WBC SEEN NO ORGANISMS SEEN     Culture      No growth aerobically or anaerobically. Performed at [facility], [address], [place], [place] [address]    Report Status 11/01/2023 FINAL   Aerobic/Anaerobic Culture w Gram  Stain (surgical/deep wound)     Status: None   Collection Time: 10/27/23  2:00 PM   Specimen: Synovium; Body Fluid  Result Value Ref Range   Specimen Description      SYNOVIAL FLUID Performed at [facility], [address] [place], [place] [address]    Special Requests      NONE Performed at [facility], [address] [place], [place] [address]    Gram Stain      ABUNDANT WBC PRESENT, PREDOMINANTLY PMN NO ORGANISMS SEEN    Culture      No growth aerobically or anaerobically. Performed at [facility], [address], [place], [place] [address]    Report Status 11/01/2023 FINAL   Synovial cell count + diff, w/ crystals     Status: Abnormal   Collection Time: 10/27/23  2:00 PM  Result Value Ref Range   Color, Synovial RED (A) YELLOW   Appearance-Synovial TURBID (A) CLEAR   Crystals, Fluid NO CRYSTALS SEEN    WBC, Synovial UNABLE TO PERFORM COUNT DUE TO CLOT IN SPECIMEN 0 - 200 /cu mm    Comment: Performed at [facility], [address] [place], [place] [address]  Glucose, capillary     Status: Abnormal   Collection Time: 10/27/23  4:24 PM  Result Value Ref Range   Glucose-Capillary 206 (H) 70 - 99 mg/dL    Comment: Glucose reference range applies only to samples taken after fasting for at least 8 hours.   Comment 1 Notify RN   Glucose, capillary     Status: Abnormal   Collection Time: 10/27/23  9:44 PM  Result Value Ref Range   Glucose-Capillary 497 (H) 70 - 99 mg/dL    Comment: Glucose reference range applies only to samples taken after fasting for at least 8 hours.   Comment 1 Notify RN   Glucose, capillary     Status: Abnormal   Collection Time: 10/27/23 11:19 PM  Result Value Ref Range   Glucose-Capillary 568 (HH) 70 - 99 mg/dL    Comment: Glucose reference range applies only to samples taken after fasting for at least 8 hours.   Comment 1 Notify RN   Comprehensive metabolic panel     Status: Abnormal   Collection Time: 10/28/23  12:08 AM  Result Value Ref Range   Sodium 129 (L) 135 - 145 mmol/L   Potassium 4.5 3.5 - 5.1 mmol/L   Chloride 93 (L) 98 - 111 mmol/L   CO2 22 22 - 32 mmol/L   Glucose, Bld 502 (HH) 70 - 99 mg/dL    Comment: CRITICAL RESULT CALLED TO, READ BACK BY AND VERIFIED WITH Clavender Monpleh @0056  on 10/28/23 skl Glucose reference range applies only to samples taken after fasting for at least 8 hours.    BUN 12 6 - 20 mg/dL   Creatinine, Ser 6.29 0.44 - 1.00 mg/dL   Calcium 8.4 (L) 8.9 - 10.3 mg/dL   Total Protein 6.2 (L) 6.5 - 8.1 g/dL   Albumin 2.4 (L) 3.5 - 5.0 g/dL   AST 32 15 - 41 U/L   ALT 19 0 - 44 U/L   Alkaline Phosphatase 137 (H) 38 - 126 U/L   Total Bilirubin 0.8 0.0 - 1.2 mg/dL   GFR, Estimated >52 >84 mL/min    Comment: (NOTE) Calculated using the CKD-EPI Creatinine Equation (2021)    Anion gap 14 5 - 15    Comment: Performed at [facility], [address] [place], [place] [address]  Glucose, capillary     Status: Abnormal   Collection Time: 10/28/23 12:23 AM  Result Value Ref Range   Glucose-Capillary 492 (H) 70 - 99 mg/dL    Comment: Glucose reference range applies only to samples taken after fasting for at least 8 hours.  Glucose, capillary     Status: Abnormal   Collection Time: 10/28/23  1:36 AM  Result Value Ref Range   Glucose-Capillary 248 (H) 70 - 99 mg/dL    Comment: Glucose reference range applies only to samples taken after fasting for at least 8 hours.  Glucose, capillary     Status: Abnormal   Collection Time: 10/28/23  3:01 AM  Result Value Ref Range   Glucose-Capillary 135 (H) 70 - 99 mg/dL    Comment: Glucose reference range applies only to samples taken after fasting for at least 8 hours.   Comment 1 Notify RN   Sedimentation rate     Status: Abnormal   Collection Time: 10/28/23  4:19 AM  Result Value Ref Range   Sed Rate 66 (H) 0 - 20 mm/hr    Comment: Performed at [facility], [address] [place], [place] [address]   C-reactive protein     Status: Abnormal   Collection Time: 10/28/23  4:19 AM  Result Value Ref Range   CRP 18.5 (H) <1.0 mg/dL    Comment: Performed at [facility], [address] [place], [place] [address]  Basic metabolic panel     Status: Abnormal   Collection Time: 10/28/23  4:19 AM  Result Value Ref Range   Sodium 133 (L) 135 - 145 mmol/L   Potassium 3.8 3.5 - 5.1 mmol/L   Chloride 97 (L) 98 - 111 mmol/L   CO2 26 22 - 32 mmol/L   Glucose, Bld 116 (H) 70 - 99 mg/dL    Comment: Glucose reference range applies only to samples taken after fasting for at least 8 hours.   BUN 13 6 - 20 mg/dL   Creatinine, Ser 0.98 0.44 - 1.00 mg/dL   Calcium 8.7 (L) 8.9 - 10.3 mg/dL   GFR, Estimated >11 >91 mL/min    Comment: (NOTE) Calculated using the CKD-EPI Creatinine Equation (2021)    Anion gap 10 5 - 15    Comment: Performed at [facility], [address] [place], [place] [address]  CBC     Status: Abnormal   Collection Time: 10/28/23  4:19 AM  Result Value Ref Range   WBC 16.3 (H) 4.0 - 10.5 K/uL   RBC 3.29 (L) 3.87 - 5.11 MIL/uL   Hemoglobin 11.0 (L) 12.0 - 15.0 g/dL   HCT 56.2 (L) 13.0 - 86.5 %   MCV 97.6 80.0 - 100.0 fL   MCH 33.4 26.0 - 34.0 pg   MCHC 34.3 30.0 - 36.0 g/dL   RDW 78.4 69.6 - 29.5 %   Platelets 511 (H) 150 - 400 K/uL   nRBC 0.0 0.0 - 0.2 %    Comment: Performed at [facility], [address] [place], [place] [address]  Magnesium     Status: None   Collection Time: 10/28/23  4:19 AM  Result Value Ref Range   Magnesium 2.0 1.7 - 2.4 mg/dL    Comment: Performed at [facility], [address], [place], [place] [address]  Phosphorus     Status: None   Collection Time: 10/28/23  4:19 AM  Result Value Ref Range   Phosphorus 2.7 2.5 - 4.6 mg/dL    Comment: Performed at [facility], [address] [place],  [place] [address]  Glucose, capillary     Status: Abnormal   Collection Time: 10/28/23  5:14 AM  Result  Value Ref Range   Glucose-Capillary 135 (H) 70 - 99 mg/dL    Comment: Glucose reference range applies only to samples taken after fasting for at least 8 hours.  Glucose, capillary     Status: Abnormal   Collection Time: 10/28/23  7:32 AM  Result Value Ref Range   Glucose-Capillary 183 (H) 70 - 99 mg/dL    Comment: Glucose reference range applies only to samples taken after fasting for at least 8 hours.  Glucose, capillary     Status: Abnormal   Collection Time: 10/28/23 11:51 AM  Result Value Ref Range   Glucose-Capillary 323 (H) 70 - 99 mg/dL    Comment: Glucose reference range applies only to samples taken after fasting for at least 8 hours.  Glucose, capillary     Status: Abnormal   Collection Time: 10/28/23  3:56 PM  Result Value Ref Range   Glucose-Capillary 104 (H) 70 - 99 mg/dL    Comment: Glucose reference range applies only to samples taken after fasting for at least 8 hours.  Glucose, capillary     Status: Abnormal   Collection Time: 10/28/23  9:18 PM  Result Value Ref Range   Glucose-Capillary 261 (H) 70 - 99 mg/dL    Comment: Glucose reference range applies only to samples taken after fasting for at least 8 hours.  Basic metabolic panel     Status: Abnormal   Collection Time: 10/29/23  4:16 AM  Result Value Ref Range   Sodium 132 (L) 135 - 145 mmol/L   Potassium 4.0 3.5 - 5.1 mmol/L   Chloride 95 (L) 98 - 111 mmol/L   CO2 25 22 - 32 mmol/L   Glucose, Bld 221 (H) 70 - 99 mg/dL    Comment: Glucose reference range applies only to samples taken after fasting for at least 8 hours.   BUN 7 6 - 20 mg/dL   Creatinine, Ser 2.13 0.44 - 1.00 mg/dL   Calcium 8.5 (L) 8.9 - 10.3 mg/dL   GFR, Estimated >08 >65 mL/min    Comment: (NOTE) Calculated using the CKD-EPI Creatinine Equation (2021)    Anion gap 12 5 - 15    Comment: Performed at [facility], [address] [place], [place] [address]  CBC     Status: Abnormal   Collection Time: 10/29/23  4:16 AM  Result  Value Ref Range   WBC 14.9 (H) 4.0 - 10.5 K/uL   RBC 3.18 (L) 3.87 - 5.11 MIL/uL   Hemoglobin 10.6 (L) 12.0 - 15.0 g/dL   HCT 62.9 (L) 52.8 - 41.3 %   MCV 96.5 80.0 - 100.0 fL   MCH 33.3 26.0 - 34.0 pg   MCHC 34.5 30.0 - 36.0 g/dL   RDW 24.4 01.0 - 27.2 %   Platelets 659 (H) 150 - 400 K/uL   nRBC 0.0 0.0 - 0.2 %    Comment: Performed at [facility], [address] [place], [place] [address]  Magnesium     Status: None   Collection Time: 10/29/23  4:16 AM  Result Value Ref Range   Magnesium 1.9 1.7 - 2.4 mg/dL    Comment: Performed at [facility], [address], [place], [place] [address]  Phosphorus     Status: None   Collection Time: 10/29/23  4:16 AM  Result Value Ref Range   Phosphorus  3.1 2.5 - 4.6 mg/dL    Comment: Performed at [facility], [address] [place], [place] [address]  Glucose, capillary     Status: Abnormal   Collection Time: 10/29/23  7:14 AM  Result Value Ref Range   Glucose-Capillary 253 (H) 70 - 99 mg/dL    Comment: Glucose reference range applies only to samples taken after fasting for at least 8 hours.  ECHO TEE     Status: None   Collection Time: 10/29/23  8:37 AM  Result Value Ref Range   Est EF 55 - 60%   Glucose, capillary     Status: Abnormal   Collection Time: 10/29/23 11:18 AM  Result Value Ref Range   Glucose-Capillary 214 (H) 70 - 99 mg/dL    Comment: Glucose reference range applies only to samples taken after fasting for at least 8 hours.  Glucose, capillary     Status: Abnormal   Collection Time: 10/29/23  3:29 PM  Result Value Ref Range   Glucose-Capillary 259 (H) 70 - 99 mg/dL    Comment: Glucose reference range applies only to samples taken after fasting for at least 8 hours.  Surgical pcr screen     Status: Abnormal   Collection Time: 10/29/23  6:10 PM   Specimen: Nasal Mucosa; Nasal Swab  Result Value Ref Range   MRSA, PCR POSITIVE (A) NEGATIVE    Comment: RESULT CALLED TO, READ BACK BY AND  VERIFIED WITH: DRISS ZINE 10/29/23 2100 MU    Staphylococcus aureus POSITIVE (A) NEGATIVE    Comment: (NOTE) The Xpert SA Assay (FDA approved for NASAL specimens in patients 83 years of age and older), is one component of a comprehensive surveillance program. It is not intended to diagnose infection nor to guide or monitor treatment. Performed at [facility], [address] [place], [place] [address]   Glucose, capillary     Status: Abnormal   Collection Time: 10/29/23  9:11 PM  Result Value Ref Range   Glucose-Capillary 178 (H) 70 - 99 mg/dL    Comment: Glucose reference range applies only to samples taken after fasting for at least 8 hours.  Surgical pathology     Status: None   Collection Time: 10/30/23 12:00 AM  Result Value Ref Range   SURGICAL PATHOLOGY      SURGICAL PATHOLOGY [facility] [address] [place], [place] [address] Telephone [phone] or [phone] Fax [phone]  REPORT OF SURGICAL PATHOLOGY   Accession #: [id number] Patient Name: Danielle, Harrison Visit # : [id number]  MRN: [id number] Physician: [name] DOB/Age 07-11-1976 (Age: 6) Gender: F Collected Date: 10/30/2023 Received Date: 10/30/2023  FINAL DIAGNOSIS       1. Toe(s), amputation, Left first proximal phalanx :       - ACUTE OSTEOMYELITIS INVOLVING THE INKED DISTAL MARGIN.       2. Toe(s), amputation, Left first metatarsal head :       - ACUTE OSTEOMYELITIS.      - PROXIMAL MARGIN WITH INKED CUT SURFACE OF BONE; CHRONIC INACTIVE OSTEOMYELITIS      IS PRESENT.       ELECTRONIC SIGNATURE : Rubinas Md, Delice Bison , Sports administrator, Electronic Signature  MICROSCOPIC DESCRIPTION  CASE COMMENTS STAINS USED IN DIAGNOSIS: H&E H&E H&E    CLINICAL HISTORY  SPECIMEN(S) OBTAINED 1. Toe(s), am putation, Left First Proximal Phalanx 2. Toe(s), amputation, Left First Metatarsal Head  SPECIMEN COMMENTS: 1. Distal Left foot margin  ink 2.  Proximal margin ink SPECIMEN CLINICAL INFORMATION: 1. Left foot septic arthritis    Gross Description 1. Specimen: "Left first proximal phalanx"; received fresh and placed in formalin      Orientation: Distal end inked (per requisition), over inked black      Dimensions: 2.8 x 2.1 x 1.2 cm      Distal margin: Grossly viable; smooth, flat, firm.      Proximal end: Smooth, tan-white, disarticulated surface with a incomplete      pink-red rim.      Cut surface: Tan-white and firm without softening or hemorrhage.      Block summary: Representative, perpendicular sections are submitted in 1 block      (1A) and placed in decalcification solution 2. Specimen: "Left first metatarsal head"; received fresh and placed in formalin      Orientation: Proximal end inked (per requisition), over inked black      Dimensions: 2.6 x 1.7 cm, 2 .8 cm long      Proximal margin: Grossly viable; smooth, flat, firm; 0.2-0.4 cm cortical rim      Distal end: Smooth, tan-white, disarticulated surface with a mild amount of      pink-tan, soft tissue..      Cut surface: Tan-yellow, trabeculated, and firm without softening or hemorrhage.      Block summary: Representative, perpendicular sections are submitted in 2 block      (2A-B) and placed in decalcification solution      AMG 01.16.2025        Report signed out from the following location(s) [facility] [address] [place], [place] [address] CLIA #: [id number]  [facility] [address] [place], [place] [address] CLIA #: [id number]   Basic metabolic panel     Status: Abnormal   Collection Time: 10/30/23  5:43 AM  Result Value Ref Range   Sodium 132 (L) 135 - 145 mmol/L   Potassium 3.8 3.5 - 5.1 mmol/L   Chloride 97 (L) 98 - 111 mmol/L   CO2 25 22 - 32 mmol/L   Glucose, Bld 215 (H) 70 - 99 mg/dL    Comment: Glucose reference range applies only to samples taken after fasting for at least 8 hours.    BUN 8 6 - 20 mg/dL   Creatinine, Ser 6.29 0.44 - 1.00 mg/dL   Calcium 8.8 (L) 8.9 - 10.3 mg/dL   GFR, Estimated >52 >84 mL/min    Comment: (NOTE) Calculated using the CKD-EPI Creatinine Equation (2021)    Anion gap 10 5 - 15    Comment: Performed at [facility], [address] [place], [place] [address]  CBC     Status: Abnormal   Collection Time: 10/30/23  5:43 AM  Result Value Ref Range   WBC 14.2 (H) 4.0 - 10.5 K/uL   RBC 3.26 (L) 3.87 - 5.11 MIL/uL   Hemoglobin 10.8 (L) 12.0 - 15.0 g/dL   HCT 01.0 (L) 27.2 - 53.6 %   MCV 98.8 80.0 - 100.0 fL   MCH 33.1 26.0 - 34.0 pg   MCHC 33.5 30.0 - 36.0 g/dL   RDW 64.4 03.4 - 74.2 %   Platelets 761 (H) 150 - 400 K/uL   nRBC 0.0 0.0 - 0.2 %    Comment: Performed at [facility], [address], [place], [place] [address]  Magnesium     Status: None   Collection Time: 10/30/23  5:43 AM  Result Value Ref Range  Magnesium 2.1 1.7 - 2.4 mg/dL    Comment: Performed at [facility], [address] [place], [place] [address]  Phosphorus     Status: None   Collection Time: 10/30/23  5:43 AM  Result Value Ref Range   Phosphorus 3.2 2.5 - 4.6 mg/dL    Comment: Performed at [facility], [address] [place], [place] [address]  Culture, blood (Routine X 2) w Reflex to ID Panel     Status: None   Collection Time: 10/30/23  5:43 AM   Specimen: BLOOD RIGHT ARM  Result Value Ref Range   Specimen Description BLOOD RIGHT ARM    Special Requests      BOTTLES DRAWN AEROBIC AND ANAEROBIC Blood Culture results may not be optimal due to an inadequate volume of blood received in culture bottles   Culture      NO GROWTH 5 DAYS Performed at [facility], [address], [place], [place] [address]    Report Status 11/04/2023 FINAL   Osmolality     Status: None   Collection Time: 10/30/23  5:43 AM  Result Value Ref Range   Osmolality 293 275 - 295 mOsm/kg    Comment: REPEATED TO VERIFY Performed  at [facility], [address] [place], [place] [address]   Culture, blood (Routine X 2) w Reflex to ID Panel     Status: None   Collection Time: 10/30/23  5:48 AM   Specimen: BLOOD RIGHT HAND  Result Value Ref Range   Specimen Description BLOOD RIGHT HAND    Special Requests      BOTTLES DRAWN AEROBIC ONLY Blood Culture results may not be optimal due to an inadequate volume of blood received in culture bottles   Culture      NO GROWTH 5 DAYS Performed at [facility], [address] [place], [place] [address]    Report Status 11/04/2023 FINAL   Glucose, capillary     Status: Abnormal   Collection Time: 10/30/23  6:53 AM  Result Value Ref Range   Glucose-Capillary 190 (H) 70 - 99 mg/dL    Comment: Glucose reference range applies only to samples taken after fasting for at least 8 hours.  Aerobic/Anaerobic Culture w Gram Stain (surgical/deep wound)     Status: None   Collection Time: 10/30/23  7:46 AM   Specimen: Bone; Tissue  Result Value Ref Range   Specimen Description      BONE Performed at [facility], [address] [place], [place] [address]    Special Requests      LEFT FIRST METATARSAL HEAD Performed at [facility], [address] [place], [place] [address]    Gram Stain NO WBC SEEN NO ORGANISMS SEEN     Culture      NO ANAEROBES ISOLATED Performed at [facility], [address], [place], [place] [address]    Report Status 11/04/2023 FINAL   Glucose, capillary     Status: Abnormal   Collection Time: 10/30/23 11:54 AM  Result Value Ref Range   Glucose-Capillary 341 (H) 70 - 99 mg/dL    Comment: Glucose reference range applies only to samples taken after fasting for at least 8 hours.   Comment 1 Notify RN    Comment 2 Document in Chart   Glucose, capillary     Status: Abnormal   Collection Time: 10/30/23  4:36 PM  Result Value Ref Range   Glucose-Capillary 195 (H) 70 -  99 mg/dL    Comment: Glucose reference  range applies only to samples taken after fasting for at least 8 hours.  Glucose, capillary     Status: Abnormal   Collection Time: 10/30/23  8:19 PM  Result Value Ref Range   Glucose-Capillary 209 (H) 70 - 99 mg/dL    Comment: Glucose reference range applies only to samples taken after fasting for at least 8 hours.  Basic metabolic panel     Status: Abnormal   Collection Time: 10/31/23  5:47 AM  Result Value Ref Range   Sodium 130 (L) 135 - 145 mmol/L   Potassium 4.4 3.5 - 5.1 mmol/L   Chloride 99 98 - 111 mmol/L   CO2 24 22 - 32 mmol/L   Glucose, Bld 287 (H) 70 - 99 mg/dL    Comment: Glucose reference range applies only to samples taken after fasting for at least 8 hours.   BUN 10 6 - 20 mg/dL   Creatinine, Ser 1.61 0.44 - 1.00 mg/dL   Calcium 8.7 (L) 8.9 - 10.3 mg/dL   GFR, Estimated >09 >60 mL/min    Comment: (NOTE) Calculated using the CKD-EPI Creatinine Equation (2021)    Anion gap 7 5 - 15    Comment: Performed at [facility], [address] [place], [place] [address]  CBC     Status: Abnormal   Collection Time: 10/31/23  5:47 AM  Result Value Ref Range   WBC 10.4 4.0 - 10.5 K/uL   RBC 2.92 (L) 3.87 - 5.11 MIL/uL   Hemoglobin 9.6 (L) 12.0 - 15.0 g/dL   HCT 81.1 (L) 91.4 - 78.2 %   MCV 100.0 80.0 - 100.0 fL   MCH 32.9 26.0 - 34.0 pg   MCHC 32.9 30.0 - 36.0 g/dL   RDW 95.6 21.3 - 08.6 %   Platelets 668 (H) 150 - 400 K/uL   nRBC 0.0 0.0 - 0.2 %    Comment: Performed at [facility], [address], [place], [place] [address]  Magnesium     Status: None   Collection Time: 10/31/23  5:47 AM  Result Value Ref Range   Magnesium 2.0 1.7 - 2.4 mg/dL    Comment: Performed at [facility], [address] [place], [place] [address]  Phosphorus     Status: None   Collection Time: 10/31/23  5:47 AM  Result Value Ref Range   Phosphorus 2.9 2.5 - 4.6 mg/dL    Comment: Performed at [facility], [address] [place], [place] [address]   Glucose, capillary     Status: Abnormal   Collection Time: 10/31/23  8:42 AM  Result Value Ref Range   Glucose-Capillary 218 (H) 70 - 99 mg/dL    Comment: Glucose reference range applies only to samples taken after fasting for at least 8 hours.  Glucose, capillary     Status: Abnormal   Collection Time: 10/31/23 11:33 AM  Result Value Ref Range   Glucose-Capillary 305 (H) 70 - 99 mg/dL    Comment: Glucose reference range applies only to samples taken after fasting for at least 8 hours.   Comment 1 Notify RN    Comment 2 Document in Chart   Glucose, capillary     Status: Abnormal   Collection Time: 10/31/23  4:28 PM  Result Value Ref Range   Glucose-Capillary 205 (H) 70 - 99 mg/dL    Comment: Glucose reference range applies only to samples taken after fasting for at  least 8 hours.   Comment 1 Notify RN    Comment 2 Document in Chart   Glucose, capillary     Status: Abnormal   Collection Time: 10/31/23  9:06 PM  Result Value Ref Range   Glucose-Capillary 299 (H) 70 - 99 mg/dL    Comment: Glucose reference range applies only to samples taken after fasting for at least 8 hours.  Basic metabolic panel     Status: Abnormal   Collection Time: 11/01/23  4:07 AM  Result Value Ref Range   Sodium 130 (L) 135 - 145 mmol/L   Potassium 4.7 3.5 - 5.1 mmol/L   Chloride 100 98 - 111 mmol/L   CO2 24 22 - 32 mmol/L   Glucose, Bld 260 (H) 70 - 99 mg/dL    Comment: Glucose reference range applies only to samples taken after fasting for at least 8 hours.   BUN 8 6 - 20 mg/dL   Creatinine, Ser 5.78 (L) 0.44 - 1.00 mg/dL   Calcium 8.7 (L) 8.9 - 10.3 mg/dL   GFR, Estimated >46 >96 mL/min    Comment: (NOTE) Calculated using the CKD-EPI Creatinine Equation (2021)    Anion gap 6 5 - 15    Comment: Performed at [facility], [address] [place], [place] [address]  CBC     Status: Abnormal   Collection Time: 11/01/23  4:07 AM  Result Value Ref Range   WBC 10.6 (H) 4.0 - 10.5 K/uL    RBC 2.67 (L) 3.87 - 5.11 MIL/uL   Hemoglobin 8.7 (L) 12.0 - 15.0 g/dL   HCT 41.3 (L) 24.4 - 01.0 %   MCV 97.8 80.0 - 100.0 fL   MCH 32.6 26.0 - 34.0 pg   MCHC 33.3 30.0 - 36.0 g/dL   RDW 27.2 53.6 - 64.4 %   Platelets 697 (H) 150 - 400 K/uL   nRBC 0.0 0.0 - 0.2 %    Comment: Performed at [facility], [address] [place], [place] [address]  Magnesium     Status: None   Collection Time: 11/01/23  4:07 AM  Result Value Ref Range   Magnesium 2.1 1.7 - 2.4 mg/dL    Comment: Performed at [facility], [address] [place], [place] [address]  Phosphorus     Status: None   Collection Time: 11/01/23  4:07 AM  Result Value Ref Range   Phosphorus 3.4 2.5 - 4.6 mg/dL    Comment: Performed at [facility], [address] [place], [place] [address]  Glucose, capillary     Status: Abnormal   Collection Time: 11/01/23  7:48 AM  Result Value Ref Range   Glucose-Capillary 222 (H) 70 - 99 mg/dL    Comment: Glucose reference range applies only to samples taken after fasting for at least 8 hours.   Comment 1 Notify RN    Comment 2 Document in Chart   Glucose, capillary     Status: Abnormal   Collection Time: 11/01/23 12:26 PM  Result Value Ref Range   Glucose-Capillary 284 (H) 70 - 99 mg/dL    Comment: Glucose reference range applies only to samples taken after fasting for at least 8 hours.   Comment 1 Notify RN    Comment 2 Document in Chart   Glucose, capillary     Status: Abnormal   Collection Time: 11/01/23  4:58 PM  Result Value Ref Range   Glucose-Capillary 169 (H) 70 - 99 mg/dL  Comment: Glucose reference range applies only to samples taken after fasting for at least 8 hours.  Basic metabolic panel     Status: Abnormal   Collection Time: 11/02/23  3:53 AM  Result Value Ref Range   Sodium 129 (L) 135 - 145 mmol/L   Potassium 4.6 3.5 - 5.1 mmol/L   Chloride 99 98 - 111 mmol/L   CO2 21 (L) 22 - 32 mmol/L   Glucose, Bld 181 (H) 70 - 99 mg/dL     Comment: Glucose reference range applies only to samples taken after fasting for at least 8 hours.   BUN 12 6 - 20 mg/dL   Creatinine, Ser 9.62 0.44 - 1.00 mg/dL   Calcium 8.9 8.9 - 95.2 mg/dL   GFR, Estimated >84 >13 mL/min    Comment: (NOTE) Calculated using the CKD-EPI Creatinine Equation (2021)    Anion gap 9 5 - 15    Comment: Performed at [facility], [address] [place], [place] [address]  CBC     Status: Abnormal   Collection Time: 11/02/23  3:53 AM  Result Value Ref Range   WBC 10.0 4.0 - 10.5 K/uL   RBC 2.78 (L) 3.87 - 5.11 MIL/uL   Hemoglobin 9.2 (L) 12.0 - 15.0 g/dL   HCT 02.7 (L) 25.3 - 66.4 %   MCV 98.2 80.0 - 100.0 fL   MCH 33.1 26.0 - 34.0 pg   MCHC 33.7 30.0 - 36.0 g/dL   RDW 40.3 47.4 - 25.9 %   Platelets 739 (H) 150 - 400 K/uL   nRBC 0.0 0.0 - 0.2 %    Comment: Performed at [facility], [address] [place], [place] [address]  Magnesium     Status: None   Collection Time: 11/02/23  3:53 AM  Result Value Ref Range   Magnesium 2.1 1.7 - 2.4 mg/dL    Comment: Performed at [facility], [address] [place], [place] [address]  Phosphorus     Status: None   Collection Time: 11/02/23  3:53 AM  Result Value Ref Range   Phosphorus 4.3 2.5 - 4.6 mg/dL    Comment: Performed at [facility], [address] [place], [place] [address]  Glucose, capillary     Status: Abnormal   Collection Time: 11/02/23  7:55 AM  Result Value Ref Range   Glucose-Capillary 213 (H) 70 - 99 mg/dL    Comment: Glucose reference range applies only to samples taken after fasting for at least 8 hours.   Comment 1 Notify RN    Comment 2 Document in Chart   Glucose, capillary     Status: Abnormal   Collection Time: 11/02/23 12:05 PM  Result Value Ref Range   Glucose-Capillary 349 (H) 70 - 99 mg/dL    Comment: Glucose reference range applies only to samples taken after fasting for at least 8 hours.  Glucose, capillary     Status: Abnormal    Collection Time: 11/02/23  4:49 PM  Result Value Ref Range   Glucose-Capillary 125 (H) 70 - 99 mg/dL    Comment: Glucose reference range applies only to samples taken after fasting for at least 8 hours.  Glucose, capillary     Status: Abnormal   Collection Time: 11/02/23  9:40 PM  Result Value Ref Range   Glucose-Capillary 101 (H) 70 - 99 mg/dL    Comment: Glucose reference range applies only to samples taken after fasting for at least 8 hours.  Glucose,  capillary     Status: Abnormal   Collection Time: 11/03/23  4:04 AM  Result Value Ref Range   Glucose-Capillary 138 (H) 70 - 99 mg/dL    Comment: Glucose reference range applies only to samples taken after fasting for at least 8 hours.  CK     Status: Abnormal   Collection Time: 11/03/23  6:40 AM  Result Value Ref Range   Total CK 14 (L) 38 - 234 U/L    Comment: Performed at [facility], [address] [place], [place] [address]  Basic metabolic panel     Status: Abnormal   Collection Time: 11/03/23  6:40 AM  Result Value Ref Range   Sodium 134 (L) 135 - 145 mmol/L   Potassium 4.8 3.5 - 5.1 mmol/L   Chloride 103 98 - 111 mmol/L   CO2 23 22 - 32 mmol/L   Glucose, Bld 151 (H) 70 - 99 mg/dL    Comment: Glucose reference range applies only to samples taken after fasting for at least 8 hours.   BUN 12 6 - 20 mg/dL   Creatinine, Ser 6.57 0.44 - 1.00 mg/dL   Calcium 8.9 8.9 - 84.6 mg/dL   GFR, Estimated >96 >29 mL/min    Comment: (NOTE) Calculated using the CKD-EPI Creatinine Equation (2021)    Anion gap 8 5 - 15    Comment: Performed at [facility], [address] [place], [place] [address]  CBC     Status: Abnormal   Collection Time: 11/03/23  6:40 AM  Result Value Ref Range   WBC 10.1 4.0 - 10.5 K/uL   RBC 2.79 (L) 3.87 - 5.11 MIL/uL   Hemoglobin 9.1 (L) 12.0 - 15.0 g/dL   HCT 32.4 (L) 40.1 - 02.7 %   MCV 97.8 80.0 - 100.0 fL   MCH 32.6 26.0 - 34.0 pg   MCHC 33.3 30.0 - 36.0 g/dL   RDW 25.3 66.4 -  40.3 %   Platelets 758 (H) 150 - 400 K/uL   nRBC 0.0 0.0 - 0.2 %    Comment: Performed at [facility], [address], [place], [place] [address]  Magnesium     Status: None   Collection Time: 11/03/23  6:40 AM  Result Value Ref Range   Magnesium 2.1 1.7 - 2.4 mg/dL    Comment: Performed at [facility], [address], [place], [place] [address]  Phosphorus     Status: Abnormal   Collection Time: 11/03/23  6:40 AM  Result Value Ref Range   Phosphorus 5.0 (H) 2.5 - 4.6 mg/dL    Comment: Performed at [facility], [address] [place], [place] [address]  Glucose, capillary     Status: Abnormal   Collection Time: 11/03/23  8:07 AM  Result Value Ref Range   Glucose-Capillary 175 (H) 70 - 99 mg/dL    Comment: Glucose reference range applies only to samples taken after fasting for at least 8 hours.   Comment 1 Notify RN    Comment 2 Document in Chart   Glucose, capillary     Status: Abnormal   Collection Time: 11/03/23 11:12 AM  Result Value Ref Range   Glucose-Capillary 125 (H) 70 - 99 mg/dL    Comment: Glucose reference range applies only to samples taken after fasting for at least 8 hours.   Comment 1 Notify RN    Comment 2 Document in Chart   Glucose, capillary     Status: Abnormal  Collection Time: 11/03/23  4:36 PM  Result Value Ref Range   Glucose-Capillary 198 (H) 70 - 99 mg/dL    Comment: Glucose reference range applies only to samples taken after fasting for at least 8 hours.  Glucose, capillary     Status: Abnormal   Collection Time: 11/03/23  7:46 PM  Result Value Ref Range   Glucose-Capillary 177 (H) 70 - 99 mg/dL    Comment: Glucose reference range applies only to samples taken after fasting for at least 8 hours.  Basic metabolic panel     Status: Abnormal   Collection Time: 11/04/23  4:32 AM  Result Value Ref Range   Sodium 133 (L) 135 - 145 mmol/L   Potassium 5.1 3.5 - 5.1 mmol/L   Chloride 100 98 - 111 mmol/L   CO2 24 22 - 32  mmol/L   Glucose, Bld 122 (H) 70 - 99 mg/dL    Comment: Glucose reference range applies only to samples taken after fasting for at least 8 hours.   BUN 16 6 - 20 mg/dL   Creatinine, Ser 1.30 0.44 - 1.00 mg/dL   Calcium 9.4 8.9 - 86.5 mg/dL   GFR, Estimated >78 >46 mL/min    Comment: (NOTE) Calculated using the CKD-EPI Creatinine Equation (2021)    Anion gap 9 5 - 15    Comment: Performed at [facility], [address] [place], [place] [address]  CBC     Status: Abnormal   Collection Time: 11/04/23  4:32 AM  Result Value Ref Range   WBC 8.9 4.0 - 10.5 K/uL   RBC 3.03 (L) 3.87 - 5.11 MIL/uL   Hemoglobin 9.9 (L) 12.0 - 15.0 g/dL   HCT 28.4 (L) 13.2 - 44.0 %   MCV 100.0 80.0 - 100.0 fL   MCH 32.7 26.0 - 34.0 pg   MCHC 32.7 30.0 - 36.0 g/dL   RDW 10.2 72.5 - 36.6 %   Platelets 837 (H) 150 - 400 K/uL   nRBC 0.0 0.0 - 0.2 %    Comment: Performed at [facility], [address], [place], [place] [address]  Magnesium     Status: None   Collection Time: 11/04/23  4:32 AM  Result Value Ref Range   Magnesium 2.1 1.7 - 2.4 mg/dL    Comment: Performed at [facility], [address], [place], [place] [address]  Phosphorus     Status: Abnormal   Collection Time: 11/04/23  4:32 AM  Result Value Ref Range   Phosphorus 5.6 (H) 2.5 - 4.6 mg/dL    Comment: Performed at [facility], [address] [place], [place] [address]  Sedimentation rate     Status: Abnormal   Collection Time: 11/04/23  4:32 AM  Result Value Ref Range   Sed Rate 107 (H) 0 - 20 mm/hr    Comment: Performed at [facility], [address] [place], [place] [address]  C-reactive protein     Status: Abnormal   Collection Time: 11/04/23  4:32 AM  Result Value Ref Range   CRP 4.0 (H) <1.0 mg/dL    Comment: Performed at [facility], [address] [place], [place] [address]  Glucose, capillary     Status: Abnormal   Collection Time: 11/04/23  7:44 AM  Result Value  Ref Range   Glucose-Capillary 113 (H) 70 - 99 mg/dL    Comment: Glucose reference range applies only to samples taken after fasting for at least 8  hours.  Glucose, capillary     Status: Abnormal   Collection Time: 11/04/23 12:09 PM  Result Value Ref Range   Glucose-Capillary 192 (H) 70 - 99 mg/dL    Comment: Glucose reference range applies only to samples taken after fasting for at least 8 hours.  Glucose, capillary     Status: Abnormal   Collection Time: 11/04/23  4:21 PM  Result Value Ref Range   Glucose-Capillary 134 (H) 70 - 99 mg/dL    Comment: Glucose reference range applies only to samples taken after fasting for at least 8 hours.  Basic metabolic panel     Status: Abnormal   Collection Time: 11/23/23  3:49 PM  Result Value Ref Range   Sodium 133 (L) 135 - 145 mmol/L   Potassium 5.6 (H) 3.5 - 5.1 mmol/L   Chloride 103 98 - 111 mmol/L   CO2 19 (L) 22 - 32 mmol/L   Glucose, Bld 176 (H) 70 - 99 mg/dL    Comment: Glucose reference range applies only to samples taken after fasting for at least 8 hours.   BUN 20 6 - 20 mg/dL   Creatinine, Ser 1.61 0.44 - 1.00 mg/dL   Calcium 09.6 8.9 - 04.5 mg/dL   GFR, Estimated >40 >98 mL/min    Comment: (NOTE) Calculated using the CKD-EPI Creatinine Equation (2021)    Anion gap 11 5 - 15    Comment: Performed at [facility], [address] [place], [place] [address]  CBC     Status: Abnormal   Collection Time: 11/23/23  3:49 PM  Result Value Ref Range   WBC 13.1 (H) 4.0 - 10.5 K/uL   RBC 3.63 (L) 3.87 - 5.11 MIL/uL   Hemoglobin 11.1 (L) 12.0 - 15.0 g/dL   HCT 78.2 (L) 95.6 - 21.3 %   MCV 95.6 80.0 - 100.0 fL   MCH 30.6 26.0 - 34.0 pg   MCHC 32.0 30.0 - 36.0 g/dL   RDW 08.6 57.8 - 46.9 %   Platelets 730 (H) 150 - 400 K/uL   nRBC 0.2 0.0 - 0.2 %    Comment: Performed at [facility], [address], [place], [place] [address]  Troponin I (High Sensitivity)     Status: None   Collection Time: 11/23/23  3:49 PM   Result Value Ref Range   Troponin I (High Sensitivity) 5 <18 ng/L    Comment: (NOTE) Elevated high sensitivity troponin I (hsTnI) values and significant  changes across serial measurements may suggest ACS but many other  chronic and acute conditions are known to elevate hsTnI results.  Refer to the "Links" section for chest pain algorithms and additional  guidance. Performed at [facility], [address] [place], [place] [address]   Hepatic function panel     Status: Abnormal   Collection Time: 11/23/23  3:49 PM  Result Value Ref Range   Total Protein 8.6 (H) 6.5 - 8.1 g/dL   Albumin 4.0 3.5 - 5.0 g/dL   AST 25 15 - 41 U/L   ALT 23 0 - 44 U/L   Alkaline Phosphatase 105 38 - 126 U/L   Total Bilirubin 0.7 0.0 - 1.2 mg/dL   Bilirubin, Direct 0.2 0.0 - 0.2 mg/dL   Indirect Bilirubin 0.5 0.3 - 0.9 mg/dL    Comment: Performed at [facility], [address] [place], [place] [address]  Lipase, blood     Status: None   Collection Time: 11/23/23  3:49  PM  Result Value Ref Range   Lipase 21 11 - 51 U/L    Comment: Performed at [facility], [address] [place], [place] [address]  Sedimentation rate     Status: Abnormal   Collection Time: 11/23/23  3:49 PM  Result Value Ref Range   Sed Rate 79 (H) 0 - 20 mm/hr    Comment: Performed at [facility], [address] [place], [place] [address]  Procalcitonin     Status: None   Collection Time: 11/23/23  3:49 PM  Result Value Ref Range   Procalcitonin <0.10 ng/mL    Comment:        Interpretation: PCT (Procalcitonin) <= 0.5 ng/mL: Systemic infection (sepsis) is not likely. Local bacterial infection is possible. (NOTE)       Sepsis PCT Algorithm           Lower Respiratory Tract                                      Infection PCT Algorithm    ----------------------------     ----------------------------         PCT < 0.25 ng/mL                PCT < 0.10 ng/mL          Strongly encourage              Strongly discourage   discontinuation of antibiotics    initiation of antibiotics    ----------------------------     -----------------------------       PCT 0.25 - 0.50 ng/mL            PCT 0.10 - 0.25 ng/mL               OR       >80% decrease in PCT            Discourage initiation of                                            antibiotics      Encourage discontinuation           of antibiotics    ----------------------------     -----------------------------         PCT >= 0.50 ng/mL              PCT 0.26 - 0.50 ng/mL               AND        <80% decrease in PCT             Encourage initiation of                                             antibiotics       Encourage continuation           of antibiotics    ----------------------------     -----------------------------        PCT >= 0.50 ng/mL                  PCT > 0.50 ng/mL  AND         increase in PCT                  Strongly encourage                                      initiation of antibiotics    Strongly encourage escalation           of antibiotics                                     -----------------------------                                           PCT <= 0.25 ng/mL                                                 OR                                        > 80% decrease in PCT                                      Discontinue / Do not initiate                                             antibiotics  Performed at [facility], [address] [place], [place] [address]   Lactic acid, plasma     Status: Abnormal   Collection Time: 11/23/23  5:25 PM  Result Value Ref Range   Lactic Acid, Venous 3.4 (HH) 0.5 - 1.9 mmol/L    Comment: CRITICAL RESULT CALLED TO, READ BACK BY AND VERIFIED WITH HEATHER GREEN 11/23/2023 AT 1821 SRR Performed at [facility], [address] [place], [place] [address]   Culture, blood (routine x 2)     Status: None   Collection Time:  11/23/23  5:29 PM   Specimen: BLOOD  Result Value Ref Range   Specimen Description BLOOD BLOOD LEFT FOREARM    Special Requests      BOTTLES DRAWN AEROBIC AND ANAEROBIC Blood Culture results may not be optimal due to an excessive volume of blood received in culture bottles   Culture      NO GROWTH 5 DAYS Performed at [facility], [address] [place], [place] [address]    Report Status 11/28/2023 FINAL   Culture, blood (routine x 2)     Status: None   Collection Time: 11/23/23  5:29 PM   Specimen: BLOOD  Result Value Ref Range   Specimen Description BLOOD BLOOD RIGHT ARM    Special Requests      BOTTLES DRAWN AEROBIC AND ANAEROBIC Blood Culture results may not be optimal due to an inadequate volume  of blood received in culture bottles   Culture      NO GROWTH 5 DAYS Performed at [facility], [address] [place], [place] [address]    Report Status 11/28/2023 FINAL   Troponin I (High Sensitivity)     Status: None   Collection Time: 11/23/23  5:49 PM  Result Value Ref Range   Troponin I (High Sensitivity) 4 <18 ng/L    Comment: (NOTE) Elevated high sensitivity troponin I (hsTnI) values and significant  changes across serial measurements may suggest ACS but many other  chronic and acute conditions are known to elevate hsTnI results.  Refer to the "Links" section for chest pain algorithms and additional  guidance. Performed at [facility], [address] [place], [place] [address]   Lactic acid, plasma     Status: Abnormal   Collection Time: 11/23/23  9:36 PM  Result Value Ref Range   Lactic Acid, Venous 2.1 (HH) 0.5 - 1.9 mmol/L    Comment: CRITICAL VALUE NOTED. VALUE IS CONSISTENT WITH PREVIOUSLY REPORTED/CALLED VALUE DLB Performed at [facility], [address] [place], [place] [address]   C-reactive protein     Status: Abnormal   Collection Time: 11/23/23  9:55 PM  Result Value Ref Range   CRP 1.4 (H) <1.0 mg/dL     Comment: Performed at [facility], [address] [place], [place] [address]  Protime-INR     Status: None   Collection Time: 11/23/23  9:55 PM  Result Value Ref Range   Prothrombin Time 14.2 11.4 - 15.2 seconds   INR 1.1 0.8 - 1.2    Comment: (NOTE) INR goal varies based on device and disease states. Performed at [facility], [address] [place], [place] [address]   APTT     Status: None   Collection Time: 11/23/23  9:55 PM  Result Value Ref Range   aPTT 31 24 - 36 seconds    Comment: Performed at [facility], [address] [place], [place] [address]  CBC     Status: Abnormal   Collection Time: 11/24/23  5:59 AM  Result Value Ref Range   WBC 10.9 (H) 4.0 - 10.5 K/uL   RBC 3.26 (L) 3.87 - 5.11 MIL/uL   Hemoglobin 10.0 (L) 12.0 - 15.0 g/dL   HCT 52.8 (L) 41.3 - 24.4 %   MCV 95.4 80.0 - 100.0 fL   MCH 30.7 26.0 - 34.0 pg   MCHC 32.2 30.0 - 36.0 g/dL   RDW 01.0 27.2 - 53.6 %   Platelets 680 (H) 150 - 400 K/uL   nRBC 0.0 0.0 - 0.2 %    Comment: Performed at [facility], [address] [place], [place] [address]  Basic metabolic panel     Status: Abnormal   Collection Time: 11/24/23  5:59 AM  Result Value Ref Range   Sodium 135 135 - 145 mmol/L   Potassium 4.0 3.5 - 5.1 mmol/L   Chloride 107 98 - 111 mmol/L   CO2 20 (L) 22 - 32 mmol/L   Glucose, Bld 104 (H) 70 - 99 mg/dL    Comment: Glucose reference range applies only to samples taken after fasting for at least 8 hours.   BUN 15 6 - 20 mg/dL   Creatinine, Ser 4.74 0.44 - 1.00 mg/dL   Calcium 9.2 8.9 - 25.9 mg/dL   GFR, Estimated >56 >38 mL/min    Comment: (NOTE) Calculated using the CKD-EPI  Creatinine Equation (2021)    Anion gap 8 5 - 15    Comment: Performed at [facility], [address] [place], [place] [address]  Type and screen [facility]     Status: None   Collection Time: 11/24/23  5:54 PM  Result Value Ref Range   ABO/RH(D) O POS     Antibody Screen NEG    Sample Expiration 11/27/2023,2359    Unit Number [id number]    Blood Component Type RED CELLS,LR    Unit division 00    Status of Unit ISSUED,FINAL    Transfusion Status OK TO TRANSFUSE    Crossmatch Result COMPATIBLE    Unit Number [id number]    Blood Component Type RED CELLS,LR    Unit division 00    Status of Unit REL FROM [facility]    Transfusion Status OK TO TRANSFUSE    Crossmatch Result COMPATIBLE   BPAM RBC     Status: None   Collection Time: 11/24/23  5:54 PM  Result Value Ref Range   ISSUE DATE / TIME [id number]    Blood Product Unit Number [id number]    PRODUCT CODE E0382V00    Unit Type and Rh 5100    Blood Product Expiration Date 664403474259    ISSUE DATE / TIME 563875643329    Blood Product Unit Number [id number]    PRODUCT CODE [id number]    Unit Type and Rh 5100    Blood Product Expiration Date 557322025427   Creatinine, serum     Status: None   Collection Time: 11/25/23  5:39 AM  Result Value Ref Range   Creatinine, Ser 0.56 0.44 - 1.00 mg/dL   GFR, Estimated >06 >23 mL/min    Comment: (NOTE) Calculated using the CKD-EPI Creatinine Equation (2021) Performed at [facility], [address] [place], [place] [address]   CBC with Differential/Platelet     Status: Abnormal   Collection Time: 11/25/23  8:06 AM  Result Value Ref Range   WBC 10.4 4.0 - 10.5 K/uL   RBC 3.26 (L) 3.87 - 5.11 MIL/uL   Hemoglobin 10.1 (L) 12.0 - 15.0 g/dL   HCT 15.1 (L) 76.1 - 60.7 %   MCV 95.7 80.0 - 100.0 fL   MCH 31.0 26.0 - 34.0 pg   MCHC 32.4 30.0 - 36.0 g/dL   RDW 37.1 06.2 - 69.4 %   Platelets 548 (H) 150 - 400 K/uL   nRBC 0.0 0.0 - 0.2 %   Neutrophils Relative % 59 %   Neutro Abs 6.3 1.7 - 7.7 K/uL   Lymphocytes Relative 25 %   Lymphs Abs 2.6 0.7 - 4.0 K/uL   Monocytes Relative 9 %   Monocytes Absolute 0.9 0.1 - 1.0 K/uL   Eosinophils Relative 5 %   Eosinophils Absolute 0.6 (H) 0.0 - 0.5 K/uL   Basophils Relative 1 %    Basophils Absolute 0.1 0.0 - 0.1 K/uL   Immature Granulocytes 1 %   Abs Immature Granulocytes 0.05 0.00 - 0.07 K/uL    Comment: Performed at [facility], [address] [place], [place] [address]  Basic metabolic panel     Status: Abnormal   Collection Time: 11/25/23  8:06 AM  Result Value Ref Range   Sodium 130 (L) 135 - 145 mmol/L   Potassium 4.0 3.5 - 5.1 mmol/L   Chloride 101 98 - 111 mmol/L   CO2 20 (L) 22 - 32 mmol/L   Glucose, Bld 161 (H) 70 -  99 mg/dL    Comment: Glucose reference range applies only to samples taken after fasting for at least 8 hours.   BUN 15 6 - 20 mg/dL   Creatinine, Ser 1.61 (L) 0.44 - 1.00 mg/dL   Calcium 9.2 8.9 - 09.6 mg/dL   GFR, Estimated >04 >54 mL/min    Comment: (NOTE) Calculated using the CKD-EPI Creatinine Equation (2021)    Anion gap 9 5 - 15    Comment: Performed at [facility], [address] [place], [place] [address]  ABO/Rh     Status: None   Collection Time: 11/25/23  8:06 AM  Result Value Ref Range   ABO/RH(D)      O POS Performed at [facility], [address] [place], [place] [address]   Glucose, capillary     Status: Abnormal   Collection Time: 11/25/23 11:12 AM  Result Value Ref Range   Glucose-Capillary 126 (H) 70 - 99 mg/dL    Comment: Glucose reference range applies only to samples taken after fasting for at least 8 hours.  Aerobic/Anaerobic Culture w Gram Stain (surgical/deep wound)     Status: None   Collection Time: 11/25/23  2:44 PM   Specimen: PATH Soft tissue  Result Value Ref Range   Specimen Description WOUND    Special Requests EPIDURAL SPACE    Gram Stain NO WBC SEEN NO ORGANISMS SEEN     Culture      No growth aerobically or anaerobically. Performed at [facility], [address], [place], [place] [address]    Report Status 11/30/2023 FINAL   Aerobic/Anaerobic Culture w Gram Stain (surgical/deep wound)     Status: None   Collection Time: 11/25/23  2:51 PM    Specimen: PATH Soft tissue  Result Value Ref Range   Specimen Description WOUND    Special Requests DISC SPACE    Gram Stain      RARE WBC PRESENT, PREDOMINANTLY PMN NO ORGANISMS SEEN    Culture      RARE METHICILLIN RESISTANT STAPHYLOCOCCUS AUREUS Sent to [facility] for further susceptibility testing. NO ANAEROBES ISOLATED SEE SEPARATE REPORT Performed at [facility], [address], [place], [place] [address]    Report Status 12/07/2023 FINAL    Organism ID, Bacteria METHICILLIN RESISTANT STAPHYLOCOCCUS AUREUS       Susceptibility   Methicillin resistant staphylococcus aureus - MIC*    CIPROFLOXACIN <=0.5 SENSITIVE Sensitive     ERYTHROMYCIN >=8 RESISTANT Resistant     GENTAMICIN <=0.5 SENSITIVE Sensitive     OXACILLIN >=4 RESISTANT Resistant     TETRACYCLINE <=1 SENSITIVE Sensitive     VANCOMYCIN 1 SENSITIVE Sensitive     TRIMETH/SULFA <=10 SENSITIVE Sensitive     CLINDAMYCIN <=0.25 SENSITIVE Sensitive     RIFAMPIN <=0.5 SENSITIVE Sensitive     Inducible Clindamycin NEGATIVE Sensitive     LINEZOLID 2 SENSITIVE Sensitive     * RARE METHICILLIN RESISTANT STAPHYLOCOCCUS AUREUS  MIC (1 Drug)-aerobic culture disc space; 11/25/2023; Intervertebral Disc; MRSA; Daptomycin     Status: Abnormal   Collection Time: 11/25/23  2:51 PM   Specimen: Intervertebral Disc  Result Value Ref Range   Min Inhibitory Conc (1 Drug) Final report (A)     Comment: (NOTE) Performed At: [facility] [address] [place], [place] [id number] Jolene Schimke MD BM:[id number] CORRECTED ON 02/17 AT 2725: PREVIOUSLY REPORTED AS Preliminary report    Source WOUND DISC SPACE     Comment: Performed at  [facility], [address], [place], [place] [address]  MIC Result     Status: Abnormal   Collection Time: 11/25/23  2:51 PM  Result Value Ref Range   Result 1 (MIC) Comment (A)     Comment: (NOTE) Methicillin - resistant Staphylococcus aureus Identification performed by account, not  confirmed by this laboratory. DAPTOMYCIN    <=1 UG/ML = SUSCEPTIBLE Performed At: [facility] [address] [place], [place] [id number] Jolene Schimke MD [id number]   Prepare RBC (crossmatch)     Status: None   Collection Time: 11/25/23  3:03 PM  Result Value Ref Range   Order Confirmation      ORDER PROCESSED BY BLOOD BANK Performed at [facility], [address] [place], [place] [address]   Vancomycin, peak     Status: Abnormal   Collection Time: 11/26/23  9:22 PM  Result Value Ref Range   Vancomycin Pk 16 (L) 30 - 40 ug/mL    Comment: Performed at [facility], [address] [place], [place] [address]  Vancomycin, trough     Status: Abnormal   Collection Time: 11/27/23  4:52 AM  Result Value Ref Range   Vancomycin Tr 8 (L) 15 - 20 ug/mL    Comment: Performed at [facility], [address] [place], [place] [address]  Creatinine, serum     Status: Abnormal   Collection Time: 11/27/23  4:52 AM  Result Value Ref Range   Creatinine, Ser 0.42 (L) 0.44 - 1.00 mg/dL   GFR, Estimated >02 >72 mL/min    Comment: (NOTE) Calculated using the CKD-EPI Creatinine Equation (2021) Performed at [facility], [address] [place], [place] [address]   CBC     Status: Abnormal   Collection Time: 11/27/23  4:52 AM  Result Value Ref Range   WBC 13.9 (H) 4.0 - 10.5 K/uL   RBC 2.99 (L) 3.87 - 5.11 MIL/uL   Hemoglobin 9.1 (L) 12.0 - 15.0 g/dL   HCT 40.3 (L) 47.4 - 25.9 %   MCV 92.0 80.0 - 100.0 fL   MCH 30.4 26.0 - 34.0 pg   MCHC 33.1 30.0 - 36.0 g/dL   RDW 56.3 87.5 - 64.3 %   Platelets 471 (H) 150 - 400 K/uL   nRBC 0.0 0.0 - 0.2 %    Comment: Performed at [facility], [address], [place], [place] [address]      Assessment & Plan:  Schedule mammogram. Return for fasting lab work. Will call with results.   Problem List Items Addressed This Visit       Cardiovascular and Mediastinum   HTN (hypertension) -  Primary   Relevant Medications   metoprolol tartrate (LOPRESSOR) 100 MG tablet   Other Relevant Orders   CMP14+EGFR     Endocrine   Type 2 diabetes mellitus without complication, without long-term current use of insulin (HCC)   Relevant Medications   metFORMIN (GLUCOPHAGE) 500 MG tablet   Other Relevant Orders   CMP14+EGFR   Hemoglobin A1c   Other Visit Diagnoses       Lipid screening       Relevant Orders   Lipid Profile     Breast cancer screening by mammogram       Relevant Orders   MM 3D SCREENING MAMMOGRAM BILATERAL BREAST       Return in about 4 months (around 04/17/2024).   Total time spent: 25 minutes  [employer], NP  12/17/2023  This document may have been prepared by [employer] Voice Recognition software and as such may include unintentional dictation errors.

## 2023-12-18 DIAGNOSIS — M86172 Other acute osteomyelitis, left ankle and foot: Secondary | ICD-10-CM | POA: Diagnosis not present

## 2023-12-20 DIAGNOSIS — M86172 Other acute osteomyelitis, left ankle and foot: Secondary | ICD-10-CM | POA: Diagnosis not present

## 2023-12-22 ENCOUNTER — Other Ambulatory Visit: Payer: Self-pay | Admitting: Cardiology

## 2023-12-22 ENCOUNTER — Other Ambulatory Visit: Payer: Self-pay | Admitting: Physician Assistant

## 2023-12-22 DIAGNOSIS — Z9889 Other specified postprocedural states: Secondary | ICD-10-CM

## 2023-12-22 MED ORDER — OXYCODONE HCL 5 MG PO TABS: 5.0000 mg | ORAL_TABLET | Freq: Four times a day (QID) | ORAL | 0 refills | Status: DC | PRN

## 2023-12-23 DIAGNOSIS — M86172 Other acute osteomyelitis, left ankle and foot: Secondary | ICD-10-CM | POA: Diagnosis not present

## 2023-12-25 DIAGNOSIS — M86172 Other acute osteomyelitis, left ankle and foot: Secondary | ICD-10-CM | POA: Diagnosis not present

## 2023-12-27 DIAGNOSIS — M86172 Other acute osteomyelitis, left ankle and foot: Secondary | ICD-10-CM | POA: Diagnosis not present

## 2023-12-29 ENCOUNTER — Other Ambulatory Visit: Payer: Self-pay | Admitting: Physician Assistant

## 2023-12-29 DIAGNOSIS — Z981 Arthrodesis status: Secondary | ICD-10-CM

## 2023-12-29 DIAGNOSIS — Z9889 Other specified postprocedural states: Secondary | ICD-10-CM

## 2023-12-29 MED ORDER — OXYCODONE HCL 5 MG PO TABS: 5.0000 mg | ORAL_TABLET | Freq: Four times a day (QID) | ORAL | 0 refills | Status: AC | PRN

## 2023-12-29 NOTE — Progress Notes (Signed)
 Placed referral for pain clinic.

## 2023-12-30 ENCOUNTER — Encounter: Payer: Self-pay | Admitting: Infectious Diseases

## 2023-12-30 ENCOUNTER — Ambulatory Visit: Payer: Medicaid Other | Attending: Infectious Diseases | Admitting: Infectious Diseases

## 2023-12-30 VITALS — BP 125/80 | HR 93 | Temp 97.7°F | Ht 60.0 in | Wt 113.0 lb

## 2023-12-30 DIAGNOSIS — M00072 Staphylococcal arthritis, left ankle and foot: Secondary | ICD-10-CM | POA: Diagnosis not present

## 2023-12-30 DIAGNOSIS — B9562 Methicillin resistant Staphylococcus aureus infection as the cause of diseases classified elsewhere: Secondary | ICD-10-CM | POA: Insufficient documentation

## 2023-12-30 DIAGNOSIS — M4624 Osteomyelitis of vertebra, thoracic region: Secondary | ICD-10-CM | POA: Insufficient documentation

## 2023-12-30 DIAGNOSIS — M4644 Discitis, unspecified, thoracic region: Secondary | ICD-10-CM | POA: Diagnosis not present

## 2023-12-30 DIAGNOSIS — R7881 Bacteremia: Secondary | ICD-10-CM | POA: Insufficient documentation

## 2023-12-30 DIAGNOSIS — G062 Extradural and subdural abscess, unspecified: Secondary | ICD-10-CM

## 2023-12-30 DIAGNOSIS — M86172 Other acute osteomyelitis, left ankle and foot: Secondary | ICD-10-CM | POA: Diagnosis not present

## 2023-12-30 MED ORDER — SULFAMETHOXAZOLE-TRIMETHOPRIM 800-160 MG PO TABS: 1.0000 | ORAL_TABLET | Freq: Two times a day (BID) | ORAL | 1 refills | Status: DC

## 2023-12-30 MED ORDER — RIFAMPIN 300 MG PO CAPS: 300.0000 mg | ORAL_CAPSULE | Freq: Two times a day (BID) | ORAL | 1 refills | Status: DC

## 2023-12-30 NOTE — Patient Instructions (Signed)
 Danielle Harrison, a 48 year old female, visited for a follow-up on her antibiotic treatment for a spinal infection post-surgery. She is currently on vancomycin and rifampin and managing her treatment well. She also discussed a foot issue related to a previous infection. No new symptoms like chest pain or shortness of breath were reported.  YOUR PLAN:  -SPINAL MRSA INFECTION POST-SURGERY: You are being treated for a MRSA infection in your spine following a T4 to T9 fusion surgery. MRSA is a type of bacteria that is resistant to many antibiotics. Continue taking vancomycin 1200 mg three times daily until January 19, 2024, and rifampin as prescribed. We will monitor your vancomycin levels and adjust the dosage if needed. After April 7, your PICC line will be removed, and you will start oral antibiotics (Bactrim and rifampin) for an additional six to ten weeks. We will also check your inflammatory markers with ESR and CRP tests. Please stay well-hydrated to support your kidney function.  -FOOT INFECTION WITH ANTIBIOTIC SPACER: You have a foot infection that required surgery and the placement of an antibiotic spacer. The foot is swollen but not showing signs of active infection. The heaviness and inability to move your toes are expected after surgery. Please follow up with Dr. Allena Katz for ongoing management of your foot condition and monitor for any signs of infection or complications.  INSTRUCTIONS:  Please schedule a follow-up appointment for the end of April or the first week of May 2025 to monitor your conditions and adjust treatment as necessary.

## 2023-12-30 NOTE — Progress Notes (Signed)
 NAME: Danielle Harrison  DOB: [birth year]  MRN: [id number]  Date/Time: 12/30/2023 10:39 AM   Subjective:   ?follow up for MRSA infection  Danielle Harrison is a 48 y.o. Was in the hospital between 10/18/2023 until 11/04/2023 for acute pancreatitis but also developed MRSA bacteremia 5 days into hospital stay ( BC positive on 1/9, 1/11 and 1/12) and T8-T9 thoracic vertebral osteomyelitis/discitis and left great toe septic arthritis for which she underwent Left foot first MTP joint resection including head of the first metatarsal and base of proximal phalanx.  This was done on 10/30/2023.  Culture was positive for MRSA.  TEE was negative on 10/29/23.  Repeat blood culture done on 10/30/2023 was negative for MRSA.  Patient was discharged on daptomycin IV for minimum of 6 weeks.  The daptomycin MIC was susceptible at 0.5 mcg.  Patient did not have insurance and she was a self-pay.   She presented to urgent care on 11/16/2023 with muscle spasm in her chest.  No workup was done that day.  She was prescribed prednisone and heating pad and wrapping of the chest wall.   She saw her PCP on 11/21/2023 and was still complaining of pain and she ordered a thoracic spine x-ray which showed progressive disc space narrowing and wedging of T6 and T7 worrisome for discitis and osteomyelitis and pathological compression fracture.   She came back to [facility] ED on 11/23/2023 complaining of worsening pain and it was hurting for her to talk turn twist or move.She ahs no fever or chills She has been 100% adherent to Daptomycin . MRI showed epidural abscess and nerve compression after 23 days of IV dapto She underwent surgery on 11/25/23 1. Posterior Segmental Instrumentation T4 to T9 2. Posterolateral arthrodesis from T4 to T9 3. Thoracic lamina to me at T6 and T7 4.  Right sided transpedicular disc debridement and washout of ventral epidural abscess 5.  Autograft harvest spinous processes 6.  Allograft placement Culture was MRSA- sensitive to  Dapt MIC<1 She was this time treated with vancomycin and discharged home on it . There has been some fluctuation in vanco levels- intially low and vanco was increased to Q8. Cr has been steady She had been adherent to vanco Q8 but has not been sticking to the correct time, so the level of vanco has been high She is currently 1250mg  Q8 Vanco trough is pending She is doing much better Pain much improved- some present on the mid back  Past Medical History:  Diagnosis Date   Anxiety    Arthritis    back   Asthma    HTN (hypertension)    Neuromuscular disorder (HCC)    weakness and numbness    Restless leg syndrome     Past Surgical History:  Procedure Laterality Date   APPLICATION OF INTRAOPERATIVE CT SCAN N/A 11/25/2023   Procedure: APPLICATION OF INTRAOPERATIVE CT SCAN;  Surgeon: Lovenia Kim, MD;  Location: [facility];  Service: Neurosurgery;  Laterality: N/A;   BIOPSY  06/28/2019   Procedure: BIOPSY;  Surgeon: Pasty Spillers, MD;  Location: [facility];  Service: Endoscopy;;   CESAREAN SECTION     x2   COLONOSCOPY WITH PROPOFOL N/A 06/28/2019   Procedure: COLONOSCOPY WITH PROPOFOL;  Surgeon: Pasty Spillers, MD;  Location: [facility];  Service: Endoscopy;  Laterality: N/A;   ESOPHAGOGASTRODUODENOSCOPY (EGD) WITH PROPOFOL N/A 06/28/2019   Procedure: ESOPHAGOGASTRODUODENOSCOPY (EGD) WITH PROPOFOL;  Surgeon: Pasty Spillers, MD;  Location: [facility];  Service: Endoscopy;  Laterality: N/A;   INCISION AND DRAINAGE Left 10/27/2023   Procedure: INCISION AND DRAINAGE LEFT FOOT;  Surgeon: Pilar Plate, DPM;  Location: [facility];  Service: Orthopedics/Podiatry;  Laterality: Left;   INCISION AND DRAINAGE Left 10/30/2023   Procedure: LEFT FOOT FIRST METATARSAL PHALANGEAL JOINT RESECTION, ANTIBIOTIC SPACER, WASHOUT, WOUND CLOSURE;  Surgeon: Pilar Plate, DPM;  Location: [facility];  Service: Orthopedics/Podiatry;  Laterality: Left;    TEE WITHOUT CARDIOVERSION N/A 10/29/2023   Procedure: TRANSESOPHAGEAL ECHOCARDIOGRAM (TEE);  Surgeon: Antonieta Iba, MD;  Location: [facility];  Service: Cardiovascular;  Laterality: N/A;   THORACIC LAMINECTOMY FOR EPIDURAL ABSCESS N/A 11/25/2023   Procedure: T6-T7 thoracic laminectomy, right sided transpedicular decompression and disc debridement;  Surgeon: Lovenia Kim, MD;  Location: [facility];  Service: Neurosurgery;  Laterality: N/A;   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   Procedure: LEFT FOOT 1ST MPJ RESECTION;  Surgeon: Pilar Plate, DPM;  Location: [facility];  Service: Orthopedics/Podiatry;  Laterality: Left;   TRANSMETATARSAL AMPUTATION Left 10/30/2023   Procedure: LEFT FOOT 1ST MPJ RESECTION;  Surgeon: Pilar Plate, DPM;  Location: [facility];  Service: Orthopedics/Podiatry;  Laterality: Left;    Social History   Socioeconomic History   Marital status: Married    Spouse name: Not on file   Number of children: Not on file   Years of education: Not on file   Highest education level: Not on file  Occupational History   Not on file  Tobacco Use   Smoking status: Former    Current packs/day: 0.50    Average packs/day: 0.5 packs/day for 6.0 years (3.0 ttl pk-yrs)    Types: Cigarettes   Smokeless tobacco: Never   Tobacco comments:    Quit 8/22  Vaping Use   Vaping status: Every Day   Substances: Nicotine  Substance and Sexual Activity   Alcohol use: Not Currently    Comment: 3 times per week   Drug use: Never   Sexual activity: Not on file  Other Topics Concern   Not on file  Social History Narrative   Not on file   Social Drivers of Health   Financial Resource Strain: Not on file  Food Insecurity: No Food Insecurity (11/24/2023)   Hunger Vital Sign    Worried About Running Out of Food in the Last Year: Never true    Ran Out of Food in the Last Year: Never true  Transportation Needs: No Transportation Needs (11/24/2023)   PRAPARE -  Administrator, Civil Service (Medical): No    Lack of Transportation (Non-Medical): No  Physical Activity: Not on file  Stress: Not on file  Social Connections: Not on file  Intimate Partner Violence: Not At Risk (11/24/2023)   Humiliation, Afraid, Rape, and Kick questionnaire    Fear of Current or Ex-Partner: No    Emotionally Abused: No    Physically Abused: No    Sexually Abused: No    Family History  Problem Relation Age of Onset   Colon cancer Neg Hx    Allergies  Allergen Reactions   Diclofenac Hives   I? Current Outpatient Medications  Medication Sig Dispense Refill   acetaminophen (TYLENOL) 500 MG tablet Take 2 tablets (1,000 mg total) by mouth every 8 (eight) hours as needed. 100 tablet 2   albuterol (VENTOLIN HFA) 108 (90 Base) MCG/ACT inhaler Inhale 1 puff into the lungs every 6 (six) hours as needed for wheezing or shortness of breath. 8 g  3   baclofen (LIORESAL) 10 MG tablet TAKE 1 TABLET BY MOUTH EVERY DAY 90 tablet 1   bisacodyl (DULCOLAX) 5 MG EC tablet Take 2 tablets (10 mg total) by mouth at bedtime as needed for moderate constipation. 30 tablet 0   budesonide-formoterol (SYMBICORT) 160-4.5 MCG/ACT inhaler Inhale 2 puffs into the lungs 2 (two) times daily. 1 each 3   gabapentin (NEURONTIN) 100 MG capsule Take 1 capsule (100 mg total) by mouth 3 (three) times daily. 90 capsule 3   metFORMIN (GLUCOPHAGE) 500 MG tablet Take 1 tablet (500 mg total) by mouth 2 (two) times daily with a meal. 180 tablet 1   methocarbamol (ROBAXIN) 500 MG tablet Take 1 tablet (500 mg total) by mouth every 6 (six) hours as needed for muscle spasms. 120 tablet 1   metoprolol tartrate (LOPRESSOR) 100 MG tablet Take 1 tablet (100 mg total) by mouth 2 (two) times daily. 180 tablet 1   Multiple Vitamin (MULTIVITAMIN WITH MINERALS) TABS tablet Take 1 tablet by mouth daily. 30 tablet 0   oxyCODONE (ROXICODONE) 5 MG immediate release tablet Take 1 tablet (5 mg total) by mouth every 6  (six) hours as needed for up to 7 days for severe pain (pain score 7-10). 25 tablet 0   polyethylene glycol powder (GLYCOLAX/MIRALAX) 17 GM/SCOOP powder Take 17 g by mouth daily. Mix as directed. 238 g 0   rifampin (RIFADIN) 300 MG capsule Take 1 capsule (300 mg total) by mouth every 12 (twelve) hours. 104 capsule 0   vancomycin IVPB Inject 1,250 mg into the vein every 12 (twelve) hours. 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 Labs - Thursday:  BMP and vancomycin trough Goal Vancmycin trough 18-20 mcg/mL Fax weekly lab results  promptly to [phone] Method of administration:Elastomeric Method of administration may be changed at the discretion of the patient and/or caregiver's ability to self-administer the medication ordered. Please pull PIC at completion of IV antibiotics Call [phone] with critical value or questions 104 Units 0   No current facility-administered medications for this visit.     Abtx:  Anti-infectives (From admission, onward)    None       REVIEW OF SYSTEMS:  Const: negative fever, negative chills, negative weight loss Eyes: negative diplopia or visual changes, negative eye pain ENT: negative coryza, negative sore throat Resp: negative cough, hemoptysis, dyspnea Cards: negative for chest pain, palpitations, lower extremity edema GU: negative for frequency, dysuria and hematuria GI: Negative for abdominal pain, diarrhea, bleeding, constipation Skin: negative for rash and pruritus Heme: negative for easy bruising and gum/nose bleeding MS: back pain  Neurolo:negative for headaches, dizziness, vertigo, memory problems  Psych: negative for feelings of anxiety, depression  Endocrine: negative for thyroid, diabetes Allergy/Immunology- diclofenac Objective:  VITALS:  BP 125/80   Pulse 93    Temp 97.7 F (36.5 C) (Temporal)   Ht 5' (1.524 m)   Wt 113 lb (51.3 kg)   LMP 06/01/2022 (Exact Date)   SpO2 98%   BMI 22.07 kg/m   LDA PICC PHYSICAL EXAM:  General: Alert, cooperative, no distress, appears stated age.  Head: Normocephalic, without obvious abnormality, atraumatic. Eyes: Conjunctivae clear, anicteric sclerae. Pupils are equal ENT Nares normal. No drainage or sinus tenderness. Lips, mucosa, and tongue normal. No Thrush Neck: Supple, symmetrical, no adenopathy, thyroid: non tender no carotid bruit and no  JVD. Back: thoracic scar healed well Lungs: Clear to auscultation bilaterally. No Wheezing or Rhonchi. No rales. Heart: Regular rate and rhythm, no murmur, rub or gallop. Abdomen: Soft, non-tender,not distended. Bowel sounds normal. No masses Extremities: atraumatic, no cyanosis. No edema. No clubbing Skin: No rashes or lesions. Or bruising Lymph: Cervical, supraclavicular normal. Neurologic: Grossly non-focal Pertinent Labs  3/11 hb 12 Wbc 5.2 Plt 368 Vanco 20.8 Cr 0.51    ? Impression/Recommendation ? ?MRSA bacteremia in Jan 2025 with T6-T7 discitis  and left great toe septic arthritis and osteo She has been on appropriate antibiotic since last admission and it is now 23 days of Iv daptomycin and there is progression of the vertebral osteomyelitis and now with epidural abscess and nerve compression causing severe pain. She underwent surgery on 11/25/23 1. Posterior Segmental Instrumentation T4 to T9 2. Posterolateral arthrodesis from T4 to T9 3. Thoracic lamina to me at T6 and T7 4.  Right sided transpedicular disc debridement and washout of ventral epidural abscess 5.  Autograft harvest spinous processes 6.  Allograft placement  culture was MRSA ( susceptible to vanco and dapto)  She is on vancomycin Dose increased based on level She is also taking rifampin She will complete 8 weeks of this combo on 4/7 and after that will go on bactrim+ rifampin  for 6 more weeks  Sed rate /crp normalized  Anemia improved   Thrombocytosis resolved   Left great toe infection with septic arthritis and osteo of the Met- s/p resection- site has healed well     H/o pancreatitis- On 3/6 Blood glucose was high- 428 Need to see her latest blood glucose - will refer to her PCP ? _  ________________________________________________ Discussed with patient,and friend Follow up 8 weeks Note:  This document was prepared using Dragon voice recognition software and may include unintentional dictation errors.

## 2023-12-31 ENCOUNTER — Other Ambulatory Visit: Payer: Self-pay | Admitting: Family Medicine

## 2023-12-31 DIAGNOSIS — M4624 Osteomyelitis of vertebra, thoracic region: Secondary | ICD-10-CM

## 2024-01-01 DIAGNOSIS — M86172 Other acute osteomyelitis, left ankle and foot: Secondary | ICD-10-CM | POA: Diagnosis not present

## 2024-01-03 DIAGNOSIS — M86172 Other acute osteomyelitis, left ankle and foot: Secondary | ICD-10-CM | POA: Diagnosis not present

## 2024-01-05 ENCOUNTER — Encounter: Payer: Medicaid Other | Admitting: Neurosurgery

## 2024-01-05 IMAGING — CT CT ABD-PELV W/ CM
2 of 5 series · 15 of 46 positions shown, 17 images · IV contrast (APPLIED)
Comparison: 02/27/2021.

CLINICAL DATA: Chronic epigastric pain, hypertension. Severe
unrelenting epigastric pain for 2 days with nausea.

EXAM:
CT ABDOMEN AND PELVIS WITH CONTRAST
TECHNIQUE: Multidetector CT imaging of the abdomen and pelvis was performed
using the standard protocol following bolus administration of
intravenous contrast.

[Series 2: routine abd/pel with · axial · 0.71mm/px · z∈[-587,-222]mm · 12 of 83 slices shown, 14 images]
[im 5/83  soft-tissue]
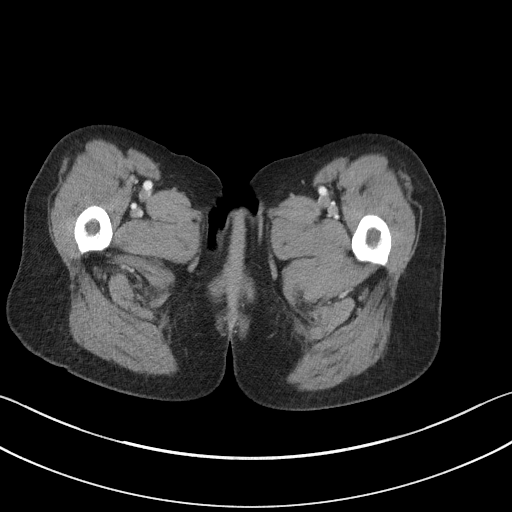
[im 5/83  bone]
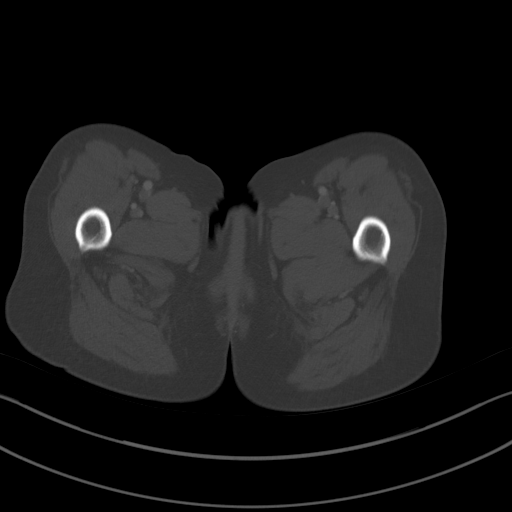
[im 14/83  soft-tissue]
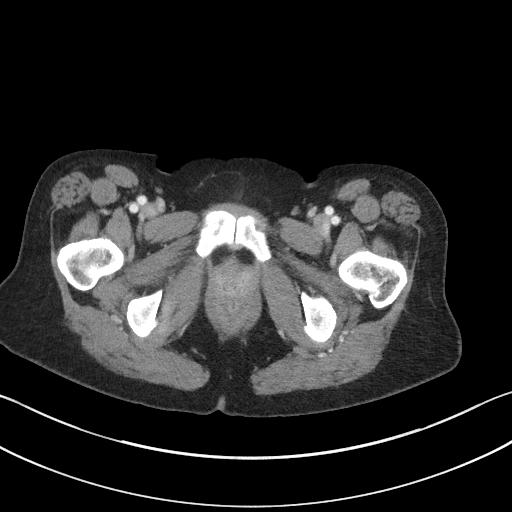
[im 19/83  soft-tissue]
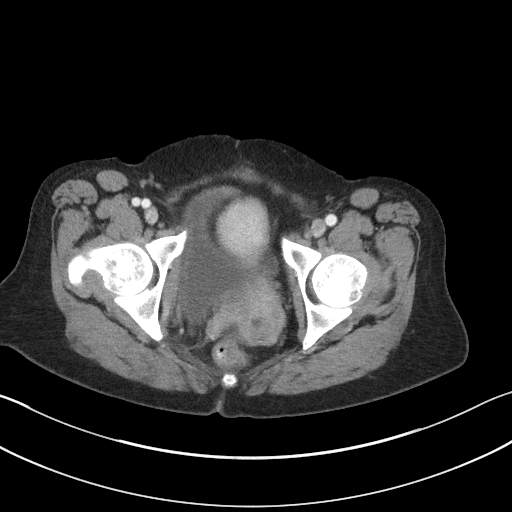
[im 23/83  soft-tissue]
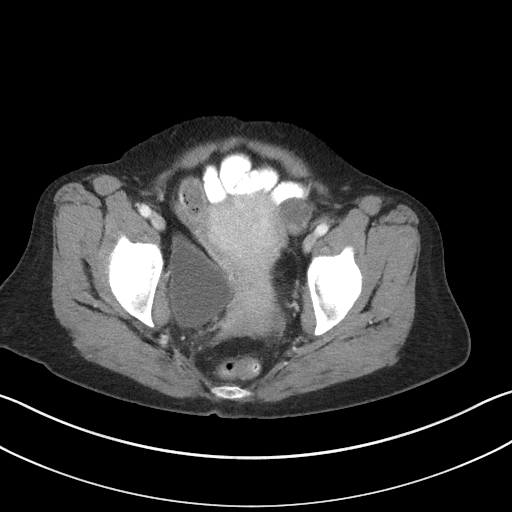
[im 32/83  soft-tissue]
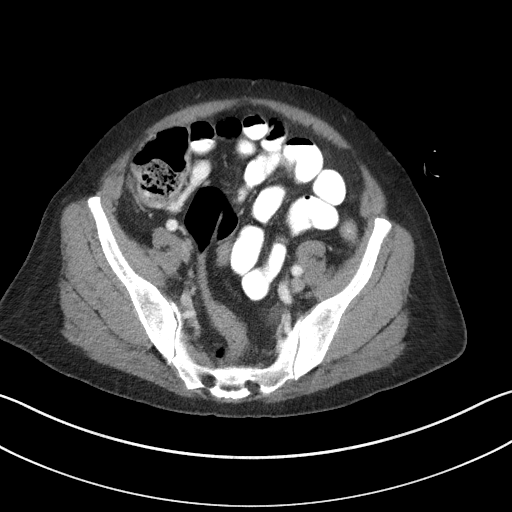
[im 37/83  soft-tissue]
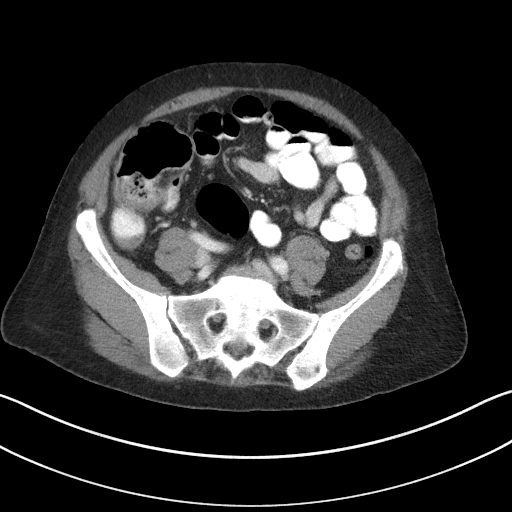
[im 46/83  soft-tissue]
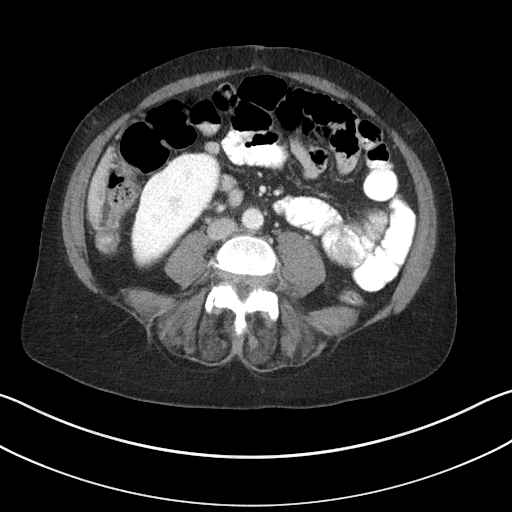
[im 51/83  soft-tissue]
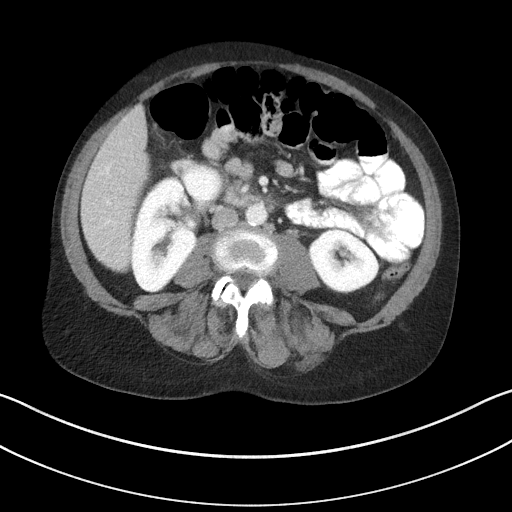
[im 60/83  soft-tissue]
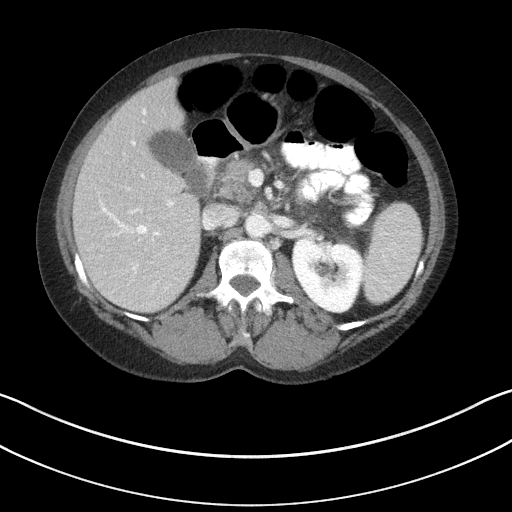
[im 60/83  bone]
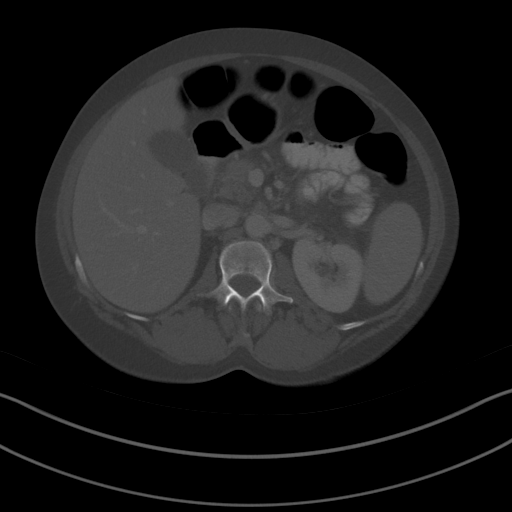
[im 64/83  soft-tissue]
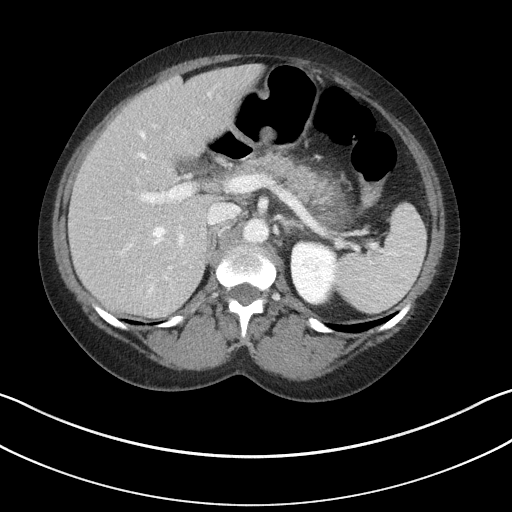
[im 69/83  soft-tissue]
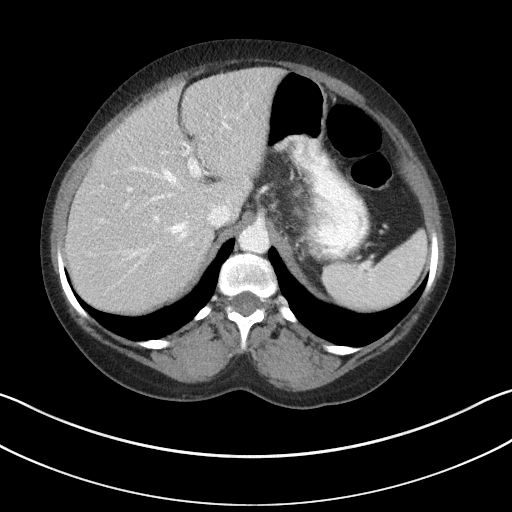
[im 78/83  soft-tissue]
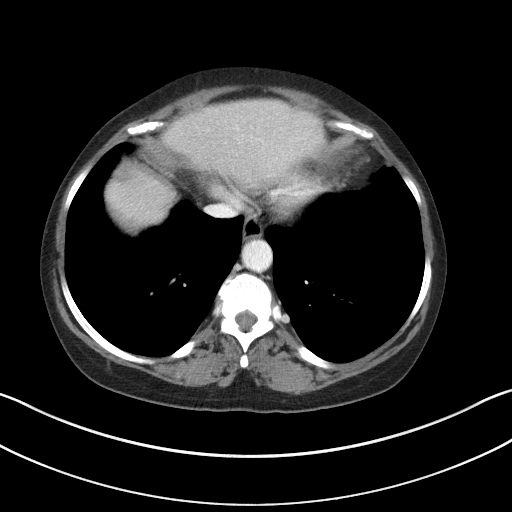

[Series 5: coronal st · coronal · 0.60mm/px · 3 of 94 slices shown]
[im 32/94  soft-tissue]
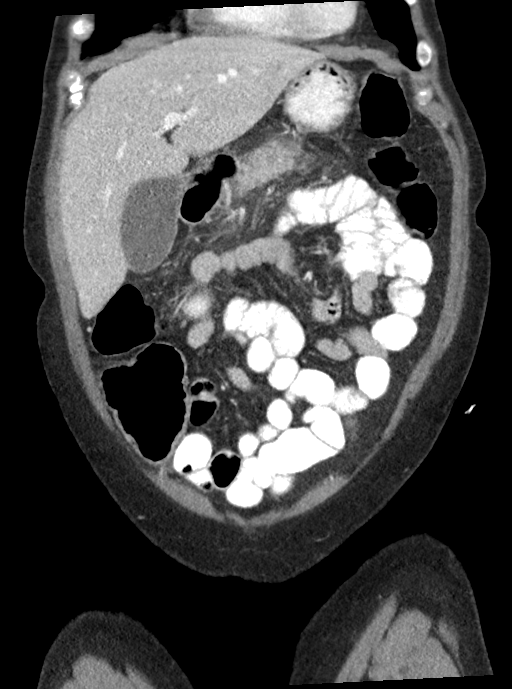
[im 42/94  soft-tissue]
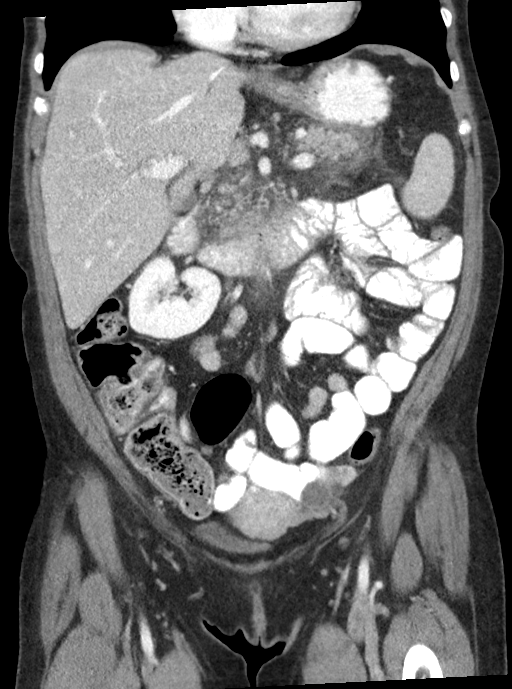
[im 52/94  soft-tissue]
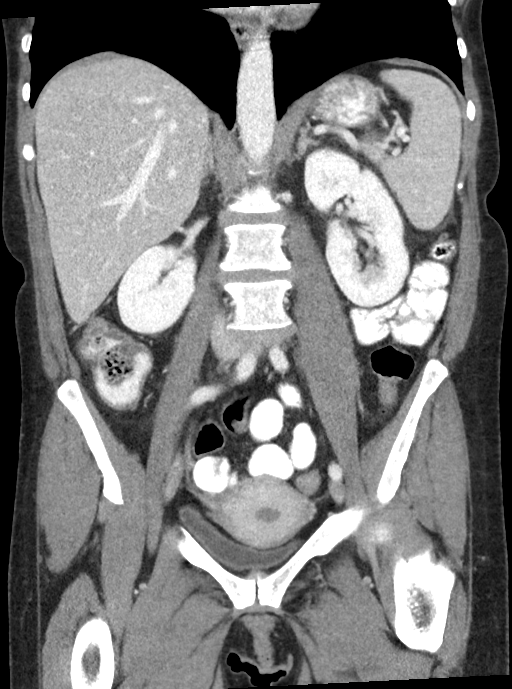

[15 of 46 positions shown; findings below may reference images not displayed]

RADIATION DOSE REDUCTION: This exam was performed according to the
departmental dose-optimization program which includes automated
exposure control, adjustment of the mA and/or kV according to
patient size and/or use of iterative reconstruction technique.

CONTRAST:  100mL OMNIPAQUE IOHEXOL 300 MG/ML  SOLN
FINDINGS: Lower chest: Lung bases are clear. Heart size normal. No pericardial
or pleural effusion. Distal esophagus is unremarkable.

Hepatobiliary: Liver and gallbladder are unremarkable. No biliary
ductal dilatation.

Pancreas: Pancreas is edematous with surrounding inflammatory
haziness and stranding. No discrete fluid collection. No ductal
dilatation. Uniform attenuation within the gland.

Spleen: Negative.

Adrenals/Urinary Tract: Adrenal glands and kidneys are unremarkable.
Ureters are decompressed. Bladder is grossly unremarkable.

Stomach/Bowel: Stomach, small bowel, appendix and colon are
unremarkable.

Vascular/Lymphatic: Atherosclerotic calcification of the aorta.
Vascular structures are patent. No pathologically enlarged lymph
nodes.

Reproductive: Uterus is visualized. Fluid is seen in the endometrial
and endocervical canal. Possible nabothian cysts. No adnexal mass.

Other: Trace fluid or thickening in the cul-de-sac. Mesenteries and
peritoneum are otherwise unremarkable.

Musculoskeletal: Degenerative changes in the spine. Mild changes of
avascular necrosis in the right femoral head. Grade 1
anterolisthesis of L4 on L5.
IMPRESSION: 1. Acute pancreatitis without complicating feature.
2. Fluid in the endometrial and endocervical canal. Please correlate
clinically.
3. Mild changes of AVN in the right femoral head.
4.  Aortic atherosclerosis ([id number]).

## 2024-01-06 DIAGNOSIS — M86172 Other acute osteomyelitis, left ankle and foot: Secondary | ICD-10-CM | POA: Diagnosis not present

## 2024-01-08 ENCOUNTER — Ambulatory Visit: Admitting: Podiatry

## 2024-01-08 DIAGNOSIS — M86172 Other acute osteomyelitis, left ankle and foot: Secondary | ICD-10-CM | POA: Diagnosis not present

## 2024-01-08 DIAGNOSIS — Z9889 Other specified postprocedural states: Secondary | ICD-10-CM

## 2024-01-08 NOTE — Progress Notes (Signed)
 Subjective:  Patient ID: Danielle Harrison, female    DOB: Aug 16, 1976,  MRN: [id number]  Chief Complaint  Patient presents with   Foot Pain    DOS: 10/30/2023 Procedure: Left first metatarsophalangeal joint resection with antibiotic spacer and wound closure  48 y.o. female returns for post-op check.  She states she is doing well minimal pain.  She denies any other acute complaints healing well ambulating in regular shoes  Review of Systems: Negative except as noted in the HPI. Denies N/V/F/Ch.  Past Medical History:  Diagnosis Date   Anxiety    Arthritis    back   Asthma    HTN (hypertension)    Neuromuscular disorder (HCC)    weakness and numbness    Restless leg syndrome     Current Outpatient Medications:    acetaminophen (TYLENOL) 500 MG tablet, Take 2 tablets (1,000 mg total) by mouth every 8 (eight) hours as needed., Disp: 100 tablet, Rfl: 2   albuterol (VENTOLIN HFA) 108 (90 Base) MCG/ACT inhaler, Inhale 1 puff into the lungs every 6 (six) hours as needed for wheezing or shortness of breath., Disp: 8 g, Rfl: 3   baclofen (LIORESAL) 10 MG tablet, TAKE 1 TABLET BY MOUTH EVERY DAY, Disp: 90 tablet, Rfl: 1   bisacodyl (DULCOLAX) 5 MG EC tablet, Take 2 tablets (10 mg total) by mouth at bedtime as needed for moderate constipation., Disp: 30 tablet, Rfl: 0   budesonide-formoterol (SYMBICORT) 160-4.5 MCG/ACT inhaler, Inhale 2 puffs into the lungs 2 (two) times daily., Disp: 1 each, Rfl: 3   gabapentin (NEURONTIN) 100 MG capsule, Take 1 capsule (100 mg total) by mouth 3 (three) times daily., Disp: 90 capsule, Rfl: 3   metFORMIN (GLUCOPHAGE) 500 MG tablet, Take 1 tablet (500 mg total) by mouth 2 (two) times daily with a meal., Disp: 180 tablet, Rfl: 1   methocarbamol (ROBAXIN) 500 MG tablet, Take 1 tablet (500 mg total) by mouth every 6 (six) hours as needed for muscle spasms., Disp: 120 tablet, Rfl: 1   metoprolol tartrate (LOPRESSOR) 100 MG tablet, Take 1 tablet (100 mg total) by  mouth 2 (two) times daily., Disp: 180 tablet, Rfl: 1   Multiple Vitamin (MULTIVITAMIN WITH MINERALS) TABS tablet, Take 1 tablet by mouth daily., Disp: 30 tablet, Rfl: 0   polyethylene glycol powder (GLYCOLAX/MIRALAX) 17 GM/SCOOP powder, Take 17 g by mouth daily. Mix as directed., Disp: 238 g, Rfl: 0   rifampin (RIFADIN) 300 MG capsule, Take 1 capsule (300 mg total) by mouth every 12 (twelve) hours., Disp: 104 capsule, Rfl: 0   [START ON 01/19/2024] rifampin (RIFADIN) 300 MG capsule, Take 1 capsule (300 mg total) by mouth 2 (two) times daily., Disp: 60 capsule, Rfl: 1   [START ON 01/19/2024] sulfamethoxazole-trimethoprim (BACTRIM DS) 800-160 MG tablet, Take 1 tablet by mouth 2 (two) times daily., Disp: 60 tablet, Rfl: 1   vancomycin IVPB, Inject 1,250 mg into the vein every 12 (twelve) hours. 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 Labs - Thursday:  BMP and vancomycin trough Goal Vancmycin trough 18-20 mcg/mL Fax weekly lab results  promptly to [phone] Method of administration:Elastomeric Method of administration may be changed at the discretion of the patient and/or caregiver's ability to self-administer the medication ordered. Please pull PIC at completion of IV antibiotics Call [phone] with critical value or questions, Disp: 104  Units, Rfl: 0  Social History   Tobacco Use  Smoking Status Former   Current packs/day: 0.50   Average packs/day: 0.5 packs/day for 6.0 years (3.0 ttl pk-yrs)   Types: Cigarettes  Smokeless Tobacco Never  Tobacco Comments   Quit 8/22    Allergies  Allergen Reactions   Diclofenac Hives   Objective:  There were no vitals filed for this visit. There is no height or weight on file to calculate BMI. Constitutional Well developed. Well nourished.  Vascular Foot warm and well perfused. Capillary  refill normal to all digits.   Neurologic Normal speech. Oriented to person, place, and time. Epicritic sensation to light touch grossly present bilaterally.  Dermatologic Skin completely epithelialized no signs of Deis is noted no complication noted.  Orthopedic: No further tenderness to palpation noted about the surgical site.   Radiographs: 3 views of skeletally mature adult left foot: Antibiotic spacer noted.  No abnormalities identified.  Good position of the spacer noted. Assessment:   No diagnosis found.  Plan:  Patient was evaluated and treated and all questions answered.  S/p foot surgery left -Progressing as expected post-operatively. -XR: See above -WB Status: Partial weightbearing to the heel in left -Sutures: Removed complication noted. -Medications: None -No acute complaints incision is healed at this time patient can begin return to regular activities no restrictions if any foot and ankle issues are future she Danielle come back and see me  No follow-ups on file.

## 2024-01-11 DIAGNOSIS — M86172 Other acute osteomyelitis, left ankle and foot: Secondary | ICD-10-CM | POA: Diagnosis not present

## 2024-01-13 DIAGNOSIS — M86172 Other acute osteomyelitis, left ankle and foot: Secondary | ICD-10-CM | POA: Diagnosis not present

## 2024-01-15 ENCOUNTER — Emergency Department

## 2024-01-15 ENCOUNTER — Other Ambulatory Visit: Payer: Self-pay

## 2024-01-15 ENCOUNTER — Inpatient Hospital Stay
Admission: EM | Admit: 2024-01-15 | Discharge: 2024-01-21 | DRG: 539 | Disposition: A | Discharge status: Home / Self Care | Attending: Hospitalist | Admitting: Hospitalist

## 2024-01-15 DIAGNOSIS — Z7984 Long term (current) use of oral hypoglycemic drugs: Secondary | ICD-10-CM

## 2024-01-15 DIAGNOSIS — G2581 Restless legs syndrome: Secondary | ICD-10-CM | POA: Diagnosis present

## 2024-01-15 DIAGNOSIS — M4646 Discitis, unspecified, lumbar region: Secondary | ICD-10-CM | POA: Diagnosis not present

## 2024-01-15 DIAGNOSIS — M545 Low back pain, unspecified: Principal | ICD-10-CM

## 2024-01-15 DIAGNOSIS — Z7951 Long term (current) use of inhaled steroids: Secondary | ICD-10-CM | POA: Diagnosis not present

## 2024-01-15 DIAGNOSIS — M8600 Acute hematogenous osteomyelitis, unspecified site: Secondary | ICD-10-CM | POA: Diagnosis not present

## 2024-01-15 DIAGNOSIS — Z79899 Other long term (current) drug therapy: Secondary | ICD-10-CM

## 2024-01-15 DIAGNOSIS — M4626 Osteomyelitis of vertebra, lumbar region: Secondary | ICD-10-CM | POA: Diagnosis not present

## 2024-01-15 DIAGNOSIS — G062 Extradural and subdural abscess, unspecified: Secondary | ICD-10-CM | POA: Diagnosis present

## 2024-01-15 DIAGNOSIS — M4624 Osteomyelitis of vertebra, thoracic region: Secondary | ICD-10-CM | POA: Diagnosis present

## 2024-01-15 DIAGNOSIS — M8618 Other acute osteomyelitis, other site: Secondary | ICD-10-CM

## 2024-01-15 DIAGNOSIS — M4644 Discitis, unspecified, thoracic region: Secondary | ICD-10-CM | POA: Diagnosis not present

## 2024-01-15 DIAGNOSIS — Z87891 Personal history of nicotine dependence: Secondary | ICD-10-CM | POA: Diagnosis not present

## 2024-01-15 DIAGNOSIS — I10 Essential (primary) hypertension: Secondary | ICD-10-CM | POA: Diagnosis not present

## 2024-01-15 DIAGNOSIS — M4620 Osteomyelitis of vertebra, site unspecified: Secondary | ICD-10-CM

## 2024-01-15 DIAGNOSIS — M48061 Spinal stenosis, lumbar region without neurogenic claudication: Secondary | ICD-10-CM | POA: Diagnosis not present

## 2024-01-15 DIAGNOSIS — J45909 Unspecified asthma, uncomplicated: Secondary | ICD-10-CM | POA: Diagnosis present

## 2024-01-15 DIAGNOSIS — B9562 Methicillin resistant Staphylococcus aureus infection as the cause of diseases classified elsewhere: Secondary | ICD-10-CM | POA: Diagnosis present

## 2024-01-15 DIAGNOSIS — E1165 Type 2 diabetes mellitus with hyperglycemia: Secondary | ICD-10-CM | POA: Diagnosis not present

## 2024-01-15 DIAGNOSIS — M869 Osteomyelitis, unspecified: Secondary | ICD-10-CM | POA: Diagnosis present

## 2024-01-15 DIAGNOSIS — M4316 Spondylolisthesis, lumbar region: Secondary | ICD-10-CM | POA: Diagnosis not present

## 2024-01-15 DIAGNOSIS — M549 Dorsalgia, unspecified: Secondary | ICD-10-CM | POA: Diagnosis not present

## 2024-01-15 LAB — COMPREHENSIVE METABOLIC PANEL WITH GFR
ALT: 9 U/L (ref 0–44)
AST: 16 U/L (ref 15–41)
Albumin: 4.1 g/dL (ref 3.5–5.0)
Alkaline Phosphatase: 121 U/L (ref 38–126)
Anion gap: 10 (ref 5–15)
BUN: 10 mg/dL (ref 6–20)
CO2: 24 mmol/L (ref 22–32)
Calcium: 9.9 mg/dL (ref 8.9–10.3)
Chloride: 101 mmol/L (ref 98–111)
Creatinine, Ser: 0.87 mg/dL (ref 0.44–1.00)
GFR, Estimated: >60 mL/min (ref >60)
Glucose, Bld: 220 mg/dL — ABNORMAL HIGH (ref 70–99)
Potassium: 4.1 mmol/L (ref 3.5–5.1)
Sodium: 135 mmol/L (ref 135–145)
Total Bilirubin: 0.5 mg/dL (ref 0.0–1.2)
Total Protein: 7.6 g/dL (ref 6.5–8.1)

## 2024-01-15 LAB — VANCOMYCIN, RANDOM
Vancomycin Rm: 32 ug/mL
Vancomycin Rm: 20 ug/mL

## 2024-01-15 LAB — CBG MONITORING, ED
Glucose-Capillary: 169 mg/dL — ABNORMAL HIGH (ref 70–99)
Glucose-Capillary: 141 mg/dL — ABNORMAL HIGH (ref 70–99)

## 2024-01-15 LAB — GLUCOSE, CAPILLARY
Glucose-Capillary: 217 mg/dL — ABNORMAL HIGH (ref 70–99)
Glucose-Capillary: 122 mg/dL — ABNORMAL HIGH (ref 70–99)

## 2024-01-15 LAB — CBC
HCT: 39.2 % (ref 36.0–46.0)
Hemoglobin: 12.8 g/dL (ref 12.0–15.0)
MCH: 30 pg (ref 26.0–34.0)
MCHC: 32.7 g/dL (ref 30.0–36.0)
MCV: 92 fL (ref 80.0–100.0)
Platelets: 410 10*3/uL — ABNORMAL HIGH (ref 150–400)
RBC: 4.26 MIL/uL (ref 3.87–5.11)
RDW: 12.4 % (ref 11.5–15.5)
WBC: 9 10*3/uL (ref 4.0–10.5)
nRBC: 0 % (ref 0.0–0.2)

## 2024-01-15 LAB — C-REACTIVE PROTEIN: CRP: 3.7 mg/dL — ABNORMAL HIGH (ref <1.0)

## 2024-01-15 LAB — SEDIMENTATION RATE: Sed Rate: 40 mm/h — ABNORMAL HIGH (ref 0–20)

## 2024-01-15 MED ORDER — ONDANSETRON HCL 4 MG PO TABS
4.0000 mg | ORAL_TABLET | Freq: Four times a day (QID) | ORAL | Status: DC | PRN
Start: 2024-01-15 — End: 2024-01-21

## 2024-01-15 MED ORDER — GABAPENTIN 100 MG PO CAPS: 100.0000 mg | ORAL_CAPSULE | Freq: Three times a day (TID) | ORAL | Status: DC

## 2024-01-15 MED ORDER — VANCOMYCIN HCL 1250 MG/250ML IV SOLN
1250.0000 mg | Freq: Two times a day (BID) | INTRAVENOUS | Status: DC
  Administered 2024-01-15: 1250 mg via INTRAVENOUS
  Filled 2024-01-15 (×3): qty 250

## 2024-01-15 MED ORDER — RIFAMPIN 300 MG PO CAPS
300.0000 mg | ORAL_CAPSULE | Freq: Two times a day (BID) | ORAL | Status: DC
  Administered 2024-01-15 – 2024-01-21 (×12): 300 mg via ORAL
  Filled 2024-01-15 (×13): qty 1

## 2024-01-15 MED ORDER — OXYCODONE HCL 5 MG PO TABS
10.0000 mg | ORAL_TABLET | ORAL | Status: DC | PRN
  Administered 2024-01-15 – 2024-01-21 (×26): 10 mg via ORAL
  Filled 2024-01-15 (×26): qty 2

## 2024-01-15 MED ORDER — ACETAMINOPHEN 500 MG PO TABS
1000.0000 mg | ORAL_TABLET | Freq: Three times a day (TID) | ORAL | Status: DC | PRN
  Administered 2024-01-15 – 2024-01-20 (×7): 1000 mg via ORAL
  Filled 2024-01-15 (×7): qty 2

## 2024-01-15 MED ORDER — BISACODYL 5 MG PO TBEC: 10.0000 mg | DELAYED_RELEASE_TABLET | Freq: Every evening | ORAL | Status: DC | PRN

## 2024-01-15 MED ORDER — VANCOMYCIN VARIABLE DOSE PER UNSTABLE RENAL FUNCTION (PHARMACIST DOSING): Status: DC

## 2024-01-15 MED ORDER — ALBUTEROL SULFATE (2.5 MG/3ML) 0.083% IN NEBU: 2.5000 mg | INHALATION_SOLUTION | Freq: Four times a day (QID) | RESPIRATORY_TRACT | Status: DC | PRN

## 2024-01-15 MED ORDER — SODIUM CHLORIDE 0.9% FLUSH: 10.0000 mL | INTRAVENOUS | Status: DC | PRN

## 2024-01-15 MED ORDER — CHLORHEXIDINE GLUCONATE CLOTH 2 % EX PADS
6.0000 | MEDICATED_PAD | Freq: Every day | CUTANEOUS | Status: DC
  Administered 2024-01-15 – 2024-01-21 (×7): 6 via TOPICAL

## 2024-01-15 MED ORDER — VANCOMYCIN HCL 1250 MG/250ML IV SOLN: 1250.0000 mg | Freq: Once | INTRAVENOUS | Status: DC

## 2024-01-15 MED ORDER — METHOCARBAMOL 500 MG PO TABS
500.0000 mg | ORAL_TABLET | Freq: Four times a day (QID) | ORAL | Status: DC | PRN
  Administered 2024-01-15 – 2024-01-20 (×5): 500 mg via ORAL
  Filled 2024-01-15 (×6): qty 1

## 2024-01-15 MED ORDER — SODIUM CHLORIDE 0.9% FLUSH
10.0000 mL | Freq: Two times a day (BID) | INTRAVENOUS | Status: DC
  Administered 2024-01-15 – 2024-01-21 (×12): 10 mL

## 2024-01-15 MED ORDER — POLYETHYLENE GLYCOL 3350 17 G PO PACK
17.0000 g | PACK | Freq: Every day | ORAL | Status: DC
  Administered 2024-01-16 – 2024-01-20 (×5): 17 g via ORAL
  Filled 2024-01-15 (×6): qty 1

## 2024-01-15 MED ORDER — SODIUM CHLORIDE 0.9 % IV SOLN
2.0000 g | INTRAVENOUS | Status: AC
  Administered 2024-01-15: 2 g via INTRAVENOUS
  Filled 2024-01-15: qty 12.5

## 2024-01-15 MED ORDER — MORPHINE SULFATE (PF) 4 MG/ML IV SOLN
4.0000 mg | Freq: Once | INTRAVENOUS | Status: AC
  Administered 2024-01-15: 4 mg via INTRAVENOUS
  Filled 2024-01-15: qty 1

## 2024-01-15 MED ORDER — MOMETASONE FURO-FORMOTEROL FUM 200-5 MCG/ACT IN AERO
2.0000 | INHALATION_SPRAY | Freq: Two times a day (BID) | RESPIRATORY_TRACT | Status: DC
  Administered 2024-01-16 – 2024-01-21 (×10): 2 via RESPIRATORY_TRACT
  Filled 2024-01-15: qty 8.8

## 2024-01-15 MED ORDER — SODIUM CHLORIDE 0.9 % IV SOLN: 2.0000 g | Freq: Once | INTRAVENOUS | Status: DC

## 2024-01-15 MED ORDER — HYDROMORPHONE HCL 1 MG/ML IJ SOLN
1.0000 mg | Freq: Once | INTRAMUSCULAR | Status: AC
  Administered 2024-01-15: 1 mg via INTRAVENOUS
  Filled 2024-01-15: qty 1

## 2024-01-15 MED ORDER — INSULIN ASPART 100 UNIT/ML IJ SOLN
0.0000 [IU] | Freq: Three times a day (TID) | INTRAMUSCULAR | Status: DC
  Administered 2024-01-15: 3 [IU] via SUBCUTANEOUS
  Administered 2024-01-15: 2 [IU] via SUBCUTANEOUS
  Administered 2024-01-16: 3 [IU] via SUBCUTANEOUS
  Administered 2024-01-16 – 2024-01-17 (×3): 2 [IU] via SUBCUTANEOUS
  Administered 2024-01-17: 3 [IU] via SUBCUTANEOUS
  Administered 2024-01-18 (×2): 2 [IU] via SUBCUTANEOUS
  Filled 2024-01-15 (×10): qty 1

## 2024-01-15 MED ORDER — OXYCODONE HCL 5 MG PO TABS
5.0000 mg | ORAL_TABLET | ORAL | Status: DC | PRN
  Administered 2024-01-15 (×2): 5 mg via ORAL
  Filled 2024-01-15 (×2): qty 1

## 2024-01-15 MED ORDER — METOPROLOL TARTRATE 50 MG PO TABS
100.0000 mg | ORAL_TABLET | Freq: Two times a day (BID) | ORAL | Status: DC
  Administered 2024-01-15 – 2024-01-21 (×12): 100 mg via ORAL
  Filled 2024-01-15 (×13): qty 2

## 2024-01-15 MED ORDER — BACLOFEN 10 MG PO TABS: 10.0000 mg | ORAL_TABLET | Freq: Every day | ORAL | Status: DC

## 2024-01-15 MED ORDER — ONDANSETRON HCL 4 MG/2ML IJ SOLN
4.0000 mg | Freq: Once | INTRAMUSCULAR | Status: AC
  Administered 2024-01-15: 4 mg via INTRAVENOUS
  Filled 2024-01-15: qty 2

## 2024-01-15 MED ORDER — GADOBUTROL 1 MMOL/ML IV SOLN
5.0000 mL | Freq: Once | INTRAVENOUS | Status: AC | PRN
  Administered 2024-01-15: 5 mL via INTRAVENOUS

## 2024-01-15 MED ORDER — LORAZEPAM 0.5 MG PO TABS
0.5000 mg | ORAL_TABLET | Freq: Once | ORAL | Status: DC
Start: 2024-01-15 — End: 2024-01-15

## 2024-01-15 MED ORDER — ONDANSETRON HCL 4 MG/2ML IJ SOLN: 4.0000 mg | Freq: Four times a day (QID) | INTRAMUSCULAR | Status: DC | PRN

## 2024-01-15 NOTE — ED Provider Notes (Signed)
 Signed out to me by Dr. Lenard Lance.  See ED course below.  Mated to medicine service for worsening osteomyelitis/discitis that has spread to lumbar spine.  Fortunately no evidence of epidural abscess. Physical Exam  BP (!) 129/91   Pulse 80   Temp 98.7 F (37.1 C)   Resp 16   Ht 5' (1.524 m)   Wt 49.9 kg   LMP 06/01/2022 (Exact Date)   SpO2 94%   BMI 21.48 kg/m   Physical Exam  Procedures  Procedures  ED Course / MDM   Clinical Course as of 01/15/24 0857  Thu Jan 15, 2024  0705 S/o: 75F complicated pmh Admit in feb for osteo of thoracic spine w/ epidural abscess - p/w 3 days worsening low back pain - on outpt abx  Labs reassuring  MRI spine w/ contrast, pending  If positive, d/w nsgy If negative, DC [MM]  0838 MRI: IMPRESSION: 1. Positive for L1-L2 Discitis Osteomyelitis, and L2-L3 spinous process Osteomyelitis, which are new since the Lumbar MRI on 10/23/2023. 2. Negative for epidural or paraspinal abscess. 3. 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  MRI concerning for [MM]  0843 Cefepime and vancomycin ordered, previously had been on vancomycin and rifampin per chart review though had been missing some vancomycin doses or mistiming them.  Medicine consult order placed [MM]  [phone] D/w Dr. Chipper Herb of hospital medicine, will admit [MM]    Clinical Course User Index [MM] Marinell Blight, MD   Medical Decision Making Amount and/or Complexity of Data Reviewed Labs: ordered. Radiology: ordered.  Risk Prescription drug management.          Marinell Blight, MD 01/15/24 [phone]

## 2024-01-15 NOTE — Consult Note (Addendum)
 Pharmacy Antibiotic Note  Danielle Harrison is a 48 y.o. female admitted on 01/15/2024 with bacteremia.  Pharmacy has been consulted for vancomycin dosing.  Per ID note: Was in the hospital between 10/18/2023 until 11/04/2023 for acute pancreatitis but also developed MRSA bacteremia 5 days into hospital stay ( BC positive on 1/9, 1/11 and 1/12) and T8-T9 thoracic vertebral osteomyelitis/discitis and left great toe septic arthritis for which she underwent Left foot first MTP joint resection including head of the first metatarsal and base of proximal phalanx. This was done on 10/30/2023. Culture was positive for MRSA. She came back to [facility] ED on 11/23/2023 complaining of worsening pain and it was hurting for her to talk turn twist or move.She ahs no fever or chills. She has been 100% adherent to Daptomycin . MRI showed epidural abscess and nerve compression after 23 days of IV dapto. She underwent surgery on 11/25/23. She was this time treated with vancomycin and discharged home on it . There has been some fluctuation in vanco levels- intially low and vanco was increased to Q8. Cr has been steady.   Plan: Last dose of vancomycin per RN was given 4/3 @ 0500 Vancomycin random drawn 4/3 @ 1010 = 32 mcg/mL Vancomycin random drawn 4/3 @ 1718 = 20 mcg/mL  Will order vancomycin 1250 mg IV Q12H and a trough level for tomorrow morning  Goal Vancmycin trough 18-20 mcg/mL  Height: 5' (152.4 cm) Weight: 49.9 kg (110 lb) IBW/kg (Calculated) : 45.5  Temp (24hrs), Avg:98.1 F (36.7 C), Min:97.6 F (36.4 C), Max:98.7 F (37.1 C)  Recent Labs  Lab 01/15/24 0637 01/15/24 1010 01/15/24 1718  WBC 9.0  --   --   CREATININE 0.87  --   --   VANCORANDOM  --  32 20    Estimated Creatinine Clearance: 57.4 mL/min (by C-G formula based on SCr of 0.87 mg/dL).    Allergies  Allergen Reactions   Diclofenac Hives   Thank you for allowing pharmacy to be a part of this patient's care.  Littie Deeds, PharmD Pharmacy Resident   01/15/2024 7:18 PM

## 2024-01-15 NOTE — Progress Notes (Signed)
 Request received for L1-L2 disc aspiration, I reviewed this case and imaging with my attending, Dr. Lowella Dandy and unfortunately there is little to no fluid to aspirate, continue current course with antibiotics. The ordering provider was made aware.  Berneta Levins, PA-C 01/15/2024, 12:36 PM

## 2024-01-15 NOTE — ED Notes (Signed)
 Grey top sent down on Ice with "save tube label"

## 2024-01-15 NOTE — ED Provider Notes (Signed)
 [facility] Provider Note    Event Date/Time   First MD Initiated Contact with Patient 01/15/24 [phone]     (approximate)  History   Chief Complaint: Back Pain  HPI  Danielle Harrison is a 48 y.o. 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  According to the patient over the last 3 days she has been experiencing significant pain in her lower spine/lower back.  Patient states it feels very similar to how her upper back felt when she was diagnosed with osteomyelitis.  Patient denies any fevers.  Patient has been receiving IV antibiotics without any issue through her PICC line at home.  Patient denies any weakness or numbness in the legs but does state the pain seems to radiate into her buttocks bilaterally at times.  Physical Exam   Triage Vital Signs: ED Triage Vitals  Encounter Vitals Group     BP 01/15/24 0543 (!) 129/91     Systolic BP Percentile --      Diastolic BP Percentile --      Pulse Rate 01/15/24 0543 80     Resp 01/15/24 0543 16     Temp 01/15/24 0543 98.7 F (37.1 C)     Temp src --      SpO2 01/15/24 0543 94 %     Weight 01/15/24 0542 110 lb (49.9 kg)     Height 01/15/24 0542 5' (1.524 m)     Head Circumference --      Peak Flow --      Pain Score 01/15/24 0542 10     Pain Loc --      Pain Education --      Exclude from Growth Chart --     Most recent vital signs: Vitals:   01/15/24 0543  BP: (!) 129/91  Pulse: 80  Resp: 16  Temp: 98.7 F (37.1 C)  SpO2: 94%    General: Awake, no distress.  CV:  Good peripheral perfusion.  Regular rate and rhythm  Resp:  Normal effort.  Equal breath sounds bilaterally.  Abd:  No distention.  Soft, nontender.  No rebound or guarding. Other:  Mild midline lumbar tenderness to  palpation.  No tenderness across the C or T-spine.  Mild lumbar paraspinal tenderness as well.   ED Results / Procedures / Treatments   RADIOLOGY  MRI pending   MEDICATIONS ORDERED IN ED: Medications  morphine (PF) 4 MG/ML injection 4 mg (has no administration in time range)  ondansetron (ZOFRAN) injection 4 mg (has no administration in time range)     IMPRESSION / MDM / ASSESSMENT AND PLAN / ED COURSE  I reviewed the triage vital signs and the nursing notes.  Patient's presentation is most consistent with acute presentation with potential threat to life or bodily function.  Patient presents to the emergency department for lower back pain.  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  We will check labs including a CBC chemistry sedimentation rate.  We will obtain an MRI with and without contrast of the lumbar spine treat pain while awaiting MRI results. Patient care signed out to oncoming provider MRI and labs pending.  FINAL CLINICAL IMPRESSION(S) / ED DIAGNOSES   Low back pain  Note:  This document was prepared using Dragon voice recognition software and may include unintentional dictation errors.   Minna Antis, MD 01/15/24 [phone]

## 2024-01-15 NOTE — ED Triage Notes (Signed)
 Pt reports she had an infection in her spine and had back surgery in February, pt states she has had lower back pain x3-4 days. Pt denies accompanying symptoms

## 2024-01-15 NOTE — Consult Note (Signed)
 NAME: Danielle Harrison  DOB: June 23, 1976  MRN: [id number]  Date/Time: 01/15/2024 2:59 PM  REQUESTING PROVIDER: Dr.Zhang Subjective:  REASON FOR CONSULT: Lumbar osteomyeltis ? Danielle Harrison is a 48 y.o. female wiith h/o pancreatitis, MRSA bacteremia, asthma, anxietybeing treated for MRSA spine infection and on IV vanco and Po rifampin presents with low back pain of 3 days duration. She is currently on IV vanco and PO rifampin being treated for MRSA infection of the thoracic spine  Pt has a complicated medical history   Was in the hospital between 10/18/2023 until 11/04/2023 for acute pancreatitis but also developed MRSA bacteremia 5 days into hospital stay ( BC positive on 1/9, 1/11 and 1/12) and T8-T9 thoracic vertebral osteomyelitis/discitis and left great toe septic arthritis for which she underwent Left foot first MTP joint resection including head of the first metatarsal and base of proximal phalanx.  This was done on 10/30/2023.  Culture was positive for MRSA.  TEE was negative on 10/29/23.  Repeat blood culture done on 10/30/2023 was negative for MRSA.  Patient was discharged on daptomycin IV for minimum of 6 weeks.  The daptomycin MIC was susceptible at 0.5 mcg.     She presented to urgent care on 11/16/2023 with muscle spasm in her chest.  No workup was done that day.  She was prescribed prednisone and heating pad and wrapping of the chest wall.   She saw her PCP on 11/21/2023 and was still complaining of pain and she ordered a thoracic spine x-ray which showed progressive disc space narrowing and wedging of T6 and T7 worrisome for discitis and osteomyelitis and pathological compression fracture.   She came back to [facility] ED on 11/23/2023 complaining of worsening pain and it was hurting for her to talk turn twist or move. MRI showed epidural abscess and nerve compression after 23 days of IV dapto She underwent surgery on 11/25/23 1. Posterior Segmental Instrumentation T4 to T9 2. Posterolateral arthrodesis  from T4 to T9 3. Thoracic laminectomy at T6 and T7 4.  Right sided transpedicular disc debridement and washout of ventral epidural abscess 5.  Autograft harvest spinous processes 6.  Allograft placement Culture was MRSA +ve  sensitive to Dapt MIC<1 She was this time treated with vancomycin and discharged home on it . There has been some fluctuation in vanco levels- intially low and vanco was increased to Q8. Cr has been steady She had been adherent to vanco Q8  Vanco trough has been as follows   12/25/23 Vanco trough 34.1 ( vanco 1500mg  Q8)  12/27/23 Vanco trough 18.6 ( vanco reduced to 1250mg  Q8  3/20 Vanco trough 20.8 Cr 0.71  01/08/24 Vanco trough 35.9 Cr 1.07  4/1 Vanco trough 31.1 Creatinine 0.77  sed rate  01/13/24 is 14 ( 0-32) CRP 5 ( 0-10)   Pt says she has no bladder or bowel issues No radiating pain The pain is below the previoaus surgical site and goes to both sides of the spine No fever Some chills She has been taking Ibuprofen  Vitals in the ED  01/15/24  BP 122/87  Temp 97.6 F (36.4 C)  Pulse Rate 90  Resp 18  SpO2 97 %    01/15/24  BP 122/87  Temp 97.6 F (36.4 C)  Pulse Rate 90  Resp 18  SpO2 97 %   Blood culture sent  MRI Positive for L1-L2 Discitis Osteomyelitis, and L2-L3 spinous process Osteomyelitis, which are new since the Lumbar MRI on 10/23/2023.   Past  Medical History:  Diagnosis Date   Anxiety    Arthritis    back   Asthma    HTN (hypertension)    Neuromuscular disorder (HCC)    weakness and numbness    Restless leg syndrome     Past Surgical History:  Procedure Laterality Date   APPLICATION OF INTRAOPERATIVE CT SCAN N/A 11/25/2023   Procedure: APPLICATION OF INTRAOPERATIVE CT SCAN;  Surgeon: Lovenia Kim, MD;  Location: [facility];  Service: Neurosurgery;  Laterality: N/A;   BIOPSY  06/28/2019   Procedure: BIOPSY;  Surgeon: Pasty Spillers, MD;  Location: [facility];  Service: Endoscopy;;   CESAREAN  SECTION     x2   COLONOSCOPY WITH PROPOFOL N/A 06/28/2019   Procedure: COLONOSCOPY WITH PROPOFOL;  Surgeon: Pasty Spillers, MD;  Location: [facility];  Service: Endoscopy;  Laterality: N/A;   ESOPHAGOGASTRODUODENOSCOPY (EGD) WITH PROPOFOL N/A 06/28/2019   Procedure: ESOPHAGOGASTRODUODENOSCOPY (EGD) WITH PROPOFOL;  Surgeon: Pasty Spillers, MD;  Location: [facility];  Service: Endoscopy;  Laterality: N/A;   INCISION AND DRAINAGE Left 10/27/2023   Procedure: INCISION AND DRAINAGE LEFT FOOT;  Surgeon: Pilar Plate, DPM;  Location: [facility];  Service: Orthopedics/Podiatry;  Laterality: Left;   INCISION AND DRAINAGE Left 10/30/2023   Procedure: LEFT FOOT FIRST METATARSAL PHALANGEAL JOINT RESECTION, ANTIBIOTIC SPACER, WASHOUT, WOUND CLOSURE;  Surgeon: Pilar Plate, DPM;  Location: [facility];  Service: Orthopedics/Podiatry;  Laterality: Left;   TEE WITHOUT CARDIOVERSION N/A 10/29/2023   Procedure: TRANSESOPHAGEAL ECHOCARDIOGRAM (TEE);  Surgeon: Antonieta Iba, MD;  Location: [facility];  Service: Cardiovascular;  Laterality: N/A;   THORACIC LAMINECTOMY FOR EPIDURAL ABSCESS N/A 11/25/2023   Procedure: T6-T7 thoracic laminectomy, right sided transpedicular decompression and disc debridement;  Surgeon: Lovenia Kim, MD;  Location: [facility];  Service: Neurosurgery;  Laterality: N/A;   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   Procedure: LEFT FOOT 1ST MPJ RESECTION;  Surgeon: Pilar Plate, DPM;  Location: [facility];  Service: Orthopedics/Podiatry;  Laterality: Left;   TRANSMETATARSAL AMPUTATION Left 10/30/2023   Procedure: LEFT FOOT 1ST MPJ RESECTION;  Surgeon: Pilar Plate, DPM;  Location: [facility];  Service: Orthopedics/Podiatry;  Laterality: Left;    Social History   Socioeconomic History   Marital status: Married    Spouse name: Not on file   Number of children: Not on file   Years of education: Not on file   Highest  education level: Not on file  Occupational History   Not on file  Tobacco Use   Smoking status: Former    Current packs/day: 0.50    Average packs/day: 0.5 packs/day for 6.0 years (3.0 ttl pk-yrs)    Types: Cigarettes   Smokeless tobacco: Never   Tobacco comments:    Quit 8/22  Vaping Use   Vaping status: Every Day   Substances: Nicotine  Substance and Sexual Activity   Alcohol use: Not Currently    Comment: 3 times per week   Drug use: Never   Sexual activity: Not on file  Other Topics Concern   Not on file  Social History Narrative   Not on file   Social Drivers of Health   Financial Resource Strain: Not on file  Food Insecurity: No Food Insecurity (01/15/2024)   Hunger Vital Sign    Worried About Running Out of Food in the Last Year: Never true    Ran Out of Food in the Last Year: Never true  Transportation Needs: No Transportation Needs (  01/15/2024)   PRAPARE - Administrator, Civil Service (Medical): No    Lack of Transportation (Non-Medical): No  Physical Activity: Not on file  Stress: Not on file  Social Connections: Not on file  Intimate Partner Violence: Not At Risk (01/15/2024)   Humiliation, Afraid, Rape, and Kick questionnaire    Fear of Current or Ex-Partner: No    Emotionally Abused: No    Physically Abused: No    Sexually Abused: No    Family History  Problem Relation Age of Onset   Colon cancer Neg Hx    Allergies  Allergen Reactions   Diclofenac Hives   I? Current Facility-Administered Medications  Medication Dose Route Frequency Provider Last Rate Last Admin   acetaminophen (TYLENOL) tablet 1,000 mg  1,000 mg Oral Q8H PRN Mikey College T, MD       albuterol (PROVENTIL) (2.5 MG/3ML) 0.083% nebulizer solution 2.5 mg  2.5 mg Inhalation Q6H PRN Mikey College T, MD       baclofen (LIORESAL) tablet 10 mg  10 mg Oral Daily Mikey College T, MD       bisacodyl (DULCOLAX) EC tablet 10 mg  10 mg Oral QHS PRN Mikey College T, MD       gabapentin  (NEURONTIN) capsule 100 mg  100 mg Oral TID Mikey College T, MD       HYDROmorphone (DILAUDID) injection 1 mg  1 mg Intravenous ONCE-1600 Marinell Blight, MD       insulin aspart (novoLOG) injection 0-9 Units  0-9 Units Subcutaneous TID WC Mikey College T, MD   2 Units at 01/15/24 1302   methocarbamol (ROBAXIN) tablet 500 mg  500 mg Oral Q6H PRN Mikey College T, MD       metoprolol tartrate (LOPRESSOR) tablet 100 mg  100 mg Oral BID Mikey College T, MD       mometasone-formoterol [facility]) 200-5 MCG/ACT inhaler 2 puff  2 puff Inhalation BID Mikey College T, MD       ondansetron [facility]) tablet 4 mg  4 mg Oral Q6H PRN Mikey College T, MD       Or   ondansetron [facility]) injection 4 mg  4 mg Intravenous Q6H PRN Mikey College T, MD       oxyCODONE (Oxy IR/ROXICODONE) immediate release tablet 5 mg  5 mg Oral Q4H PRN Mikey College T, MD   5 mg at 01/15/24 1203   polyethylene glycol (MIRALAX / GLYCOLAX) packet 17 g  17 g Oral Daily Mikey College T, MD       rifampin (RIFADIN) capsule 300 mg  300 mg Oral Q12H Mikey College T, MD       vancomycin variable dose per unstable renal function (pharmacist dosing)   Does not apply See admin instructions Emeline General, MD       Current Outpatient Medications  Medication Sig Dispense Refill   acetaminophen (TYLENOL) 500 MG tablet Take 2 tablets (1,000 mg total) by mouth every 8 (eight) hours as needed. 100 tablet 2   albuterol (VENTOLIN HFA) 108 (90 Base) MCG/ACT inhaler Inhale 1 puff into the lungs every 6 (six) hours as needed for wheezing or shortness of breath. 8 g 3   baclofen (LIORESAL) 10 MG tablet TAKE 1 TABLET BY MOUTH EVERY DAY 90 tablet 1   bisacodyl (DULCOLAX) 5 MG EC tablet Take 2 tablets (10 mg total) by mouth at bedtime as needed for moderate constipation. 30 tablet 0   budesonide-formoterol (SYMBICORT) 160-4.5 MCG/ACT  inhaler Inhale 2 puffs into the lungs 2 (two) times daily. 1 each 3   gabapentin (NEURONTIN) 100 MG capsule Take 1 capsule (100 mg total) by mouth 3  (three) times daily. 90 capsule 3   metFORMIN (GLUCOPHAGE) 500 MG tablet Take 1 tablet (500 mg total) by mouth 2 (two) times daily with a meal. 180 tablet 1   methocarbamol (ROBAXIN) 500 MG tablet Take 1 tablet (500 mg total) by mouth every 6 (six) hours as needed for muscle spasms. 120 tablet 1   metoprolol tartrate (LOPRESSOR) 100 MG tablet Take 1 tablet (100 mg total) by mouth 2 (two) times daily. 180 tablet 1   Multiple Vitamin (MULTIVITAMIN WITH MINERALS) TABS tablet Take 1 tablet by mouth daily. 30 tablet 0   polyethylene glycol powder (GLYCOLAX/MIRALAX) 17 GM/SCOOP powder Take 17 g by mouth daily. Mix as directed. 238 g 0   rifampin (RIFADIN) 300 MG capsule Take 1 capsule (300 mg total) by mouth every 12 (twelve) hours. 104 capsule 0   [START ON 01/19/2024] rifampin (RIFADIN) 300 MG capsule Take 1 capsule (300 mg total) by mouth 2 (two) times daily. 60 capsule 1   [START ON 01/19/2024] sulfamethoxazole-trimethoprim (BACTRIM DS) 800-160 MG tablet Take 1 tablet by mouth 2 (two) times daily. 60 tablet 1   vancomycin IVPB Inject 1,250 mg into the vein every 12 (twelve) hours. Indication:  Worsening MRSA lumbar discitis and epidural abscess (previously on daptomycin) First Dose: Yes Last Day of Therapy:  01/19/2024 Labs - "Sunday/Monday:  CBC/D, CMP, ESR, CRP and vancomycin trough. Labs - Thursday:  BMP and vancomycin trough Goal Vancmycin trough 18-20 mcg/mL Fax weekly lab results  promptly to [phone] Method of administration:Elastomeric Method of administration may be changed at the discretion of the patient and/or caregiver's ability to self-administer the medication ordered. Please pull PIC at completion of IV antibiotics Call [phone] with critical value or questions 104 Units 0     Abtx:  Anti-infectives (From admission, onward)    Start     Dose/Rate Route Frequency Ordered Stop   01/15/24 1600  ceFEPIme (MAXIPIME) 2 g in sodium chloride 0.9 % 100 mL IVPB  Status:  Discontinued         2 g 200 mL/hr over 30 Minutes Intravenous One time only - 1600 01/15/24 0843 01/15/24 0926   01/15/24 1600  vancomycin (VANCOREADY) IVPB 1250 mg/250 mL  Status:  Discontinued        1,250 mg 166.7 mL/hr over 90 Minutes Intravenous One time only - 1600 01/15/24 0843 01/15/24 1155   01/15/24 1155  vancomycin variable dose per unstable renal function (pharmacist dosing)         Does not apply See admin instructions 01/15/24 1155     01/15/24 1000  rifampin (RIFADIN) capsule 300 mg        300 mg Oral Every 12 hours 01/15/24 0926     04" /03/25 0930  ceFEPIme (MAXIPIME) 2 g in sodium chloride 0.9 % 100 mL IVPB        2 g 200 mL/hr over 30 Minutes Intravenous NOW 01/15/24 0926 01/15/24 1104       REVIEW OF SYSTEMS:  Const: negative fever, negative chills, negative weight loss Eyes: negative diplopia or visual changes, negative eye pain ENT: negative coryza, negative sore throat Resp: negative cough, hemoptysis, dyspnea Cards: negative for chest pain, palpitations, lower extremity edema GU: negative for frequency, dysuria and hematuria GI: Negative for abdominal pain, diarrhea, bleeding, constipation Skin: negative for rash  and pruritus Heme: negative for easy bruising and gum/nose bleeding MS: negative for myalgias, arthralgias, back pain and muscle weakness Neurolo:negative for headaches, dizziness, vertigo, memory problems  Psych: negative for feelings of anxiety, depression  Endocrine: negative for thyroid, diabetes Allergy/Immunology- negative for any medication or food allergies ? Pertinent Positives include : Objective:  VITALS:  BP 124/88 (BP Location: Left Arm)   Pulse 74   Temp 98.1 F (36.7 C) (Oral)   Resp 18   Ht 5' (1.524 m)   Wt 49.9 kg   LMP 06/01/2022 (Exact Date)   SpO2 98%   BMI 21.48 kg/m  LDA Foley Central line Other drainage tubes PHYSICAL EXAM:  General: Alert, cooperative, no distress, appears stated age.  Head: Normocephalic, without obvious  abnormality, atraumatic. Eyes: Conjunctivae clear, anicteric sclerae. Pupils are equal ENT Nares normal. No drainage or sinus tenderness. Lips, mucosa, and tongue normal. No Thrush Neck: Supple, symmetrical, no adenopathy, thyroid: non tender no carotid bruit and no JVD. Back: No CVA tenderness. Lungs: Clear to auscultation bilaterally. No Wheezing or Rhonchi. No rales. Heart: Regular rate and rhythm, no murmur, rub or gallop. Abdomen: Soft, non-tender,not distended. Bowel sounds normal. No masses Extremities: atraumatic, no cyanosis. No edema. No clubbing Skin: No rashes or lesions. Or bruising Lymph: Cervical, supraclavicular normal. Neurologic: Grossly non-focal Pertinent Labs Lab Results CBC    Component Value Date/Time   WBC 9.0 01/15/2024 0637   RBC 4.26 01/15/2024 0637   HGB 12.8 01/15/2024 0637   HGB 14.2 03/01/2014 1059   HCT 39.2 01/15/2024 0637   HCT 42.1 03/01/2014 1059   PLT 410 (H) 01/15/2024 0637   PLT 324 03/01/2014 1059   MCV 92.0 01/15/2024 0637   MCV 96 03/01/2014 1059   MCH 30.0 01/15/2024 0637   MCHC 32.7 01/15/2024 0637   RDW 12.4 01/15/2024 0637   RDW 12.5 03/01/2014 1059   LYMPHSABS 2.6 11/25/2023 0806   LYMPHSABS 2.0 03/01/2014 1059   MONOABS 0.9 11/25/2023 0806   MONOABS 0.5 03/01/2014 1059   EOSABS 0.6 (H) 11/25/2023 0806   EOSABS 0.1 03/01/2014 1059   BASOSABS 0.1 11/25/2023 0806   BASOSABS 0.1 03/01/2014 1059       Latest Ref Rng & Units 01/15/2024    6:37 AM 11/27/2023    4:52 AM 11/25/2023    8:06 AM  CMP  Glucose 70 - 99 mg/dL 161   096   BUN 6 - 20 mg/dL 10   15   Creatinine 0.45 - 1.00 mg/dL 4.09  8.11  9.14   Sodium 135 - 145 mmol/L 135   130   Potassium 3.5 - 5.1 mmol/L 4.1   4.0   Chloride 98 - 111 mmol/L 101   101   CO2 22 - 32 mmol/L 24   20   Calcium 8.9 - 10.3 mg/dL 9.9   9.2   Total Protein 6.5 - 8.1 g/dL 7.6     Total Bilirubin 0.0 - 1.2 mg/dL 0.5     Alkaline Phos 38 - 126 U/L 121     AST 15 - 41 U/L 16     ALT 0 -  44 U/L 9         Microbiology: Bc sent  IMAGING RESULTS:  I have personally reviewed the films L1-L2?confluent new marrow edema When compared to Jan 2025 imaging  Impression/Recommendation ?New Lumbar back pain with L1-L2 marrow edema questioning osteomyelitis While on Vanco and rifampin and having therapeutic values and also normal ESR and CRP  It is strange that the progression is happening on already 6 weeks of IV Thought the thoracic MRI from Feb did show L2 vertebral body edema Will discuss with radiologist  T6-T7 discitis with fusion surgery Pt is on vanoc + rifampin and it will be 8 weeks on 01/19/24, may have to extend it  DM  H/o pancreatitis   I have personally spent  -60--minutes involved in face-to-face and non-face-to-face activities for this patient on the day of the visit. Professional time spent includes the following activities: Preparing to see the patient (review of tests), Obtaining and/or reviewing separately obtained history (admission/discharge record), Performing a medically appropriate examination and/or evaluation , Ordering medications/tests/procedures, referring and communicating with other health care professionals, Documenting clinical information in the EMR, Independently interpreting results (not separately reported), Communicating results to the patient/family/caregiver, Counseling and educating the patient/family/caregiver and Care coordination (not separately reported).  It involved complex antimicrobial management  ________________________________________________ Discussed with patient, requesting provider Note:  This document was prepared using Dragon voice recognition software and may include unintentional dictation errors.

## 2024-01-15 NOTE — H&P (Signed)
 History and Physical    Danielle Harrison:[id number] DOB: 11/19/1975 DOA: 01/15/2024  PCP: [name], NP (Confirm with patient/family/NH records and if not entered, this has to be entered at [facility] point of entry) Patient coming from: Home  I have personally briefly reviewed patient's old medical records in [facility]  Chief Complaint: Back pain  HPI: Danielle Harrison is a 48 y.o. 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  Patient reported that she has been compliant with IV antibiotics 3 times a day and she also reported that about 9 to 10 days ago vancomycin dosage was increased from 1500 to 2500 mg every 8 hours.  3 days ago, she started develop new onset of lower back pain, she reported the location is lower than the thoracic spine pain she had 2 months ago, gradually getting worse but she denied any weakness of her legs and no trouble urinating or making bowel movements.  Denied any fever or chills. ED Course: Afebrile, blood pressure 120/90, WBC 9.0, hemoglobin 12.8, K4.1, creatinine 0.8 glucose 220.  MRI of the lumbar spine showed positive L1-L2 discitis osteomyelitis and L2-L3 spinous process osteomyelitis which is new compared to MRI on January 9 of this year.  Patient was given vancomycin and cefepime in the  ED.  Review of Systems: As per HPI otherwise 14 point review of systems negative.    Past Medical History:  Diagnosis Date   Anxiety    Arthritis    back   Asthma    HTN (hypertension)    Neuromuscular disorder (HCC)    weakness and numbness    Restless leg syndrome     Past Surgical History:  Procedure Laterality Date   APPLICATION OF INTRAOPERATIVE CT SCAN N/A 11/25/2023   Procedure: APPLICATION OF INTRAOPERATIVE CT SCAN;  Surgeon: Lovenia Kim, MD;  Location: [facility] OR[facility];  Service: Neurosurgery;  Laterality: N/A;   BIOPSY  06/28/2019   Procedure: BIOPSY;  Surgeon: Pasty Spillers, MD;  Location: [facility];  Service: Endoscopy;;   CESAREAN SECTION     x2   COLONOSCOPY WITH PROPOFOL N/A 06/28/2019   Procedure: COLONOSCOPY WITH PROPOFOL;  Surgeon: Pasty Spillers, MD;  Location: [facility];  Service: Endoscopy;  Laterality: N/A;   ESOPHAGOGASTRODUODENOSCOPY (EGD) WITH PROPOFOL N/A 06/28/2019   Procedure: ESOPHAGOGASTRODUODENOSCOPY (EGD) WITH PROPOFOL;  Surgeon: Pasty Spillers, MD;  Location: [facility];  Service: Endoscopy;  Laterality: N/A;   INCISION AND DRAINAGE Left 10/27/2023   Procedure: INCISION AND DRAINAGE LEFT FOOT;  Surgeon: Pilar Plate, DPM;  Location: [facility] OR[facility];  Service: Orthopedics/Podiatry;  Laterality: Left;   INCISION AND DRAINAGE Left 10/30/2023   Procedure: LEFT FOOT FIRST METATARSAL PHALANGEAL JOINT RESECTION, ANTIBIOTIC SPACER, WASHOUT, WOUND CLOSURE;  Surgeon: Pilar Plate, DPM;  Location: [facility];  Service: Orthopedics/Podiatry;  Laterality: Left;   TEE WITHOUT CARDIOVERSION N/A 10/29/2023   Procedure: TRANSESOPHAGEAL ECHOCARDIOGRAM (TEE);  Surgeon: Antonieta Iba, MD;  Location: [facility];  Service: Cardiovascular;  Laterality:  N/A;   THORACIC LAMINECTOMY FOR EPIDURAL ABSCESS N/A 11/25/2023   Procedure: T6-T7 thoracic laminectomy, right sided transpedicular decompression and disc  debridement;  Surgeon: Lovenia Kim, MD;  Location: [facility];  Service: Neurosurgery;  Laterality: N/A;   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   Procedure: LEFT FOOT 1ST MPJ RESECTION;  Surgeon: Pilar Plate, DPM;  Location: [facility];  Service: Orthopedics/Podiatry;  Laterality: Left;   TRANSMETATARSAL AMPUTATION Left 10/30/2023   Procedure: LEFT FOOT 1ST MPJ RESECTION;  Surgeon: Pilar Plate, DPM;  Location: [facility];  Service: Orthopedics/Podiatry;  Laterality: Left;     reports that she has quit smoking. Her smoking use included cigarettes. She has a 3 pack-year smoking history. She has never used smokeless tobacco. She reports that she does not currently use alcohol. She reports that she does not use drugs.  Allergies  Allergen Reactions   Diclofenac Hives    Family History  Problem Relation Age of Onset   Colon cancer Neg Hx      Prior to Admission medications   Medication Sig Start Date End Date Taking? Authorizing Provider  acetaminophen (TYLENOL) 500 MG tablet Take 2 tablets (1,000 mg total) by mouth every 8 (eight) hours as needed. 12/01/23 11/30/24  Joan Flores, PA-C  albuterol (VENTOLIN HFA) 108 (90 Base) MCG/ACT inhaler Inhale 1 puff into the lungs every 6 (six) hours as needed for wheezing or shortness of breath. 12/17/23   Scoggins, Amber, NP  baclofen (LIORESAL) 10 MG tablet TAKE 1 TABLET BY MOUTH EVERY DAY 12/15/23   Scoggins, Hospital doctor, NP  bisacodyl (DULCOLAX) 5 MG EC tablet Take 2 tablets (10 mg total) by mouth at bedtime as needed for moderate constipation. 12/17/23   Scoggins, Amber, NP  budesonide-formoterol (SYMBICORT) 160-4.5 MCG/ACT inhaler Inhale 2 puffs into the lungs 2 (two) times daily. 12/17/23   Scoggins, Amber, NP  gabapentin (NEURONTIN) 100 MG capsule Take 1 capsule (100 mg total) by mouth 3 (three) times daily. 11/13/23   Candelaria Stagers, DPM  metFORMIN (GLUCOPHAGE) 500 MG tablet Take 1 tablet (500 mg total) by mouth 2 (two) times daily  with a meal. 12/17/23   Scoggins, Amber, NP  methocarbamol (ROBAXIN) 500 MG tablet Take 1 tablet (500 mg total) by mouth every 6 (six) hours as needed for muscle spasms. 12/09/23   Susanne Borders, PA  metoprolol tartrate (LOPRESSOR) 100 MG tablet Take 1 tablet (100 mg total) by mouth 2 (two) times daily. 12/17/23   Scoggins, Amber, NP  Multiple Vitamin (MULTIVITAMIN WITH MINERALS) TABS tablet Take 1 tablet by mouth daily. 11/05/23   Gillis Santa, MD  polyethylene glycol powder (GLYCOLAX/MIRALAX) 17 GM/SCOOP powder Take 17 g by mouth daily. Mix as directed. 11/04/23   Gillis Santa, MD  rifampin (RIFADIN) 300 MG capsule Take 1 capsule (300 mg total) by mouth every 12 (twelve) hours. 11/28/23 01/19/24  Wouk, Wilfred Curtis, MD  rifampin (RIFADIN) 300 MG capsule Take 1 capsule (300 mg total) by mouth 2 (two) times daily. 01/19/24   Lynn Ito, MD  sulfamethoxazole-trimethoprim (BACTRIM DS) 800-160 MG tablet Take 1 tablet by mouth 2 (two) times daily. 01/19/24   Lynn Ito, MD  vancomycin IVPB Inject 1,250 mg into the vein every 12 (twelve) hours. 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 Labs - Thursday:  BMP and vancomycin trough Goal Vancmycin trough 18-20 mcg/mL Fax  weekly lab results  promptly to [phone] Method of administration:Elastomeric Method of administration may be changed at the discretion of the patient and/or caregiver's ability to self-administer the medication ordered. Please pull PIC at completion of IV antibiotics Call [phone] with critical value or questions 11/28/23 01/19/24  Kathrynn Running, MD    Physical Exam: Vitals:   01/15/24 0542 01/15/24 0543  BP:  (!) 129/91  Pulse:  80  Resp:  16  Temp:  98.7 F (37.1 C)  SpO2:  94%  Weight: 49.9 kg   Height: 5' (1.524 m)      Constitutional: NAD, calm, comfortable Vitals:   01/15/24 0542 01/15/24 0543  BP:  (!) 129/91  Pulse:  80  Resp:  16  Temp:  98.7 F (37.1 C)  SpO2:  94%  Weight: 49.9 kg   Height: 5' (1.524 m)    Eyes: PERRL, lids and conjunctivae normal ENMT: Mucous membranes are moist. Posterior pharynx clear of any exudate or lesions.Normal dentition.  Neck: normal, supple, no masses, no thyromegaly Respiratory: clear to auscultation bilaterally, no wheezing, no crackles. Normal respiratory effort. No accessory muscle use.  Cardiovascular: Regular rate and rhythm, no murmurs / rubs / gallops. No extremity edema. 2+ pedal pulses. No carotid bruits.  Abdomen: no tenderness, no masses palpated. No hepatosplenomegaly. Bowel sounds positive.  Musculoskeletal: Tenderness L1-L3 level Skin: no rashes, lesions, ulcers. No induration Neurologic: CN 2-12 grossly intact. Sensation intact, DTR normal. Strength 5/5 in all 4.  Psychiatric: Normal judgment and insight. Alert and oriented x 3. Normal mood.     Labs on Admission: I have personally reviewed following labs and imaging studies  CBC: Recent Labs  Lab 01/15/24 0637  WBC 9.0  HGB 12.8  HCT 39.2  MCV 92.0  PLT 410*   Basic Metabolic Panel: Recent Labs  Lab 01/15/24 0637  NA 135  K 4.1  CL 101  CO2 24  GLUCOSE 220*  BUN 10  CREATININE 0.87  CALCIUM 9.9   GFR: Estimated Creatinine Clearance: 57.4 mL/min (by C-G formula based on SCr of 0.87 mg/dL). Liver Function Tests: Recent Labs  Lab 01/15/24 0637  AST 16  ALT 9  ALKPHOS 121  BILITOT 0.5  PROT 7.6  ALBUMIN 4.1   No results for input(s): "LIPASE", "AMYLASE" in the last 168 hours. No results for input(s): "AMMONIA" in the last 168 hours. Coagulation Profile: No results for input(s): "INR", "PROTIME" in the last 168 hours. Cardiac Enzymes: No results for input(s): "CKTOTAL", "CKMB", "CKMBINDEX", "TROPONINI" in the last 168 hours. BNP (last 3 results) No results for  input(s): "PROBNP" in the last 8760 hours. HbA1C: No results for input(s): "HGBA1C" in the last 72 hours. CBG: No results for input(s): "GLUCAP" in the last 168 hours. Lipid Profile: No results for input(s): "CHOL", "HDL", "LDLCALC", "TRIG", "CHOLHDL", "LDLDIRECT" in the last 72 hours. Thyroid Function Tests: No results for input(s): "TSH", "T4TOTAL", "FREET4", "T3FREE", "THYROIDAB" in the last 72 hours. Anemia Panel: No results for input(s): "VITAMINB12", "FOLATE", "FERRITIN", "TIBC", "IRON", "RETICCTPCT" in the last 72 hours. Urine analysis:    Component Value Date/Time   COLORURINE YELLOW (A) 10/23/2023 1134   APPEARANCEUR HAZY (A) 10/23/2023 1134   APPEARANCEUR Clear 03/01/2014 1301   LABSPEC 1.015 10/23/2023 1134   LABSPEC 1.012 03/01/2014 1301   PHURINE 6.0 10/23/2023 1134   GLUCOSEU >=500 (A) 10/23/2023 1134   GLUCOSEU 50 mg/dL 96/29/5284 1324   HGBUR SMALL (A) 10/23/2023 1134   BILIRUBINUR NEGATIVE 10/23/2023 1134  BILIRUBINUR Negative 03/01/2014 1301   KETONESUR NEGATIVE 10/23/2023 1134   PROTEINUR 30 (A) 10/23/2023 1134   NITRITE NEGATIVE 10/23/2023 1134   LEUKOCYTESUR NEGATIVE 10/23/2023 1134   LEUKOCYTESUR Negative 03/01/2014 1301    Radiological Exams on Admission: MR Lumbar Spine W Wo Contrast Result Date: 01/15/2024 CLINICAL DATA:  48 year old female with back pain. "Back surgery in February, had infection in her spine". MRSA. Discitis osteomyelitis at T6 through T8 in January and February. EXAM: MRI LUMBAR SPINE WITHOUT AND WITH CONTRAST TECHNIQUE: Multiplanar and multiecho pulse sequences of the lumbar spine were obtained without and with intravenous contrast. CONTRAST:  5mL GADAVIST GADOBUTROL 1 MMOL/ML IV SOLN COMPARISON:  Thoracic MRI 11/23/2023.  Lumbar MRI 10/23/2023. FINDINGS: Segmentation: Normal, concordant with the numbering in January in February. Alignment: Stable since January. Lumbar lordosis with anterolisthesis of L4 on L5 measuring 7-8 mm. Lesser  grade 1 chronic spondylolisthesis at L3-L4 measuring 3-4 mm. Vertebrae: Confluent new marrow edema anteriorly at the L1 and L2 vertebral bodies, more pronounced at the latter. New STIR hyperintensity and enhancement at both of those levels. And intervening L1-L2 disc is now more STIR hyperintense, although not obviously enhancing. Furthermore, there is evidence of abnormal marrow edema and enhancement in the L2 and L3 spinous processes as seen on series 10, image 7 and series 6, image 7. Enhancing interspinous ligament there also. But L4-L5 STIR hyperintense endplate marrow edema asymmetric to the right is stable since January and appears to be degenerative in nature. Background bone marrow signal remains normal. Visible sacrum and SI joints remain normal. Conus medullaris and cauda equina: Conus extends to the T12-L1 level. No lower spinal cord or conus signal abnormality. No abnormal intradural enhancement. Cauda equina nerve roots appear stable and normal. No lumbar dural thickening. Paraspinal and other soft tissues: Despite the multifocal new marrow edema detailed above there is no convincing paraspinal muscle edema, inflammation, or fluid collection. And the lumbar epidural space appears to remain normal. Negative visible abdominal viscera. Disc levels: Stable degeneration since January, primarily L3-L4 and L4-L5 in the setting of chronic spondylolisthesis at those levels. There is mild chronic spinal stenosis at L4-L5, multifactorial moderate to severe bilateral lateral recess and right foraminal stenosis at that level. IMPRESSION: 1. Positive for L1-L2 Discitis Osteomyelitis, and L2-L3 spinous process Osteomyelitis, which are new since the Lumbar MRI on 10/23/2023. 2. Negative for epidural or paraspinal abscess. 3. 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 Electronically Signed   By: Odessa Fleming M.D.   On: 01/15/2024 07:42     EKG: None  Assessment/Plan Principal Problem:   Osteomyelitis (HCC) Active Problems:   Acute osteomyelitis of lumbar spine (HCC)  (please populate well all problems here in Problem List. (For example, if patient is on BP meds at home and you resume or decide to hold them, it is a problem that needs to be her. Same for CAD, COPD, HLD and so on)  Acute lumbar osteomyelitis/discitis -Failed outpatient management with IV antibiotics -Consult infectious disease, who recommended we also consult neurosurgery. -Continue IV vancomycin and rifampin  Asthma -No symptoms signs of flareup, continue as needed bronchodilators  HTN -Hydralazine as needed -Metoprolol  IIDM with hyperglycemia -Most recent A1c 6.4 in January -SSI while holding off Metformin  DVT prophylaxis: SCD Code Status: Full code Family Communication: none at bedside Disposition Plan: Patient is sick with new lumbar spine osteomyelitis discitis requiring IV antibiotics and inpatient neurosurgical consultation, expect  more than 2 midnight hospital stay Consults called: ID and neurosurgery Admission status: MedSurg admission   Emeline General MD [facility] Pager [phone]  01/15/2024, 9:29 AM

## 2024-01-15 NOTE — Consult Note (Signed)
 Pharmacy Antibiotic Note  Danielle Harrison is a 48 y.o. female admitted on 01/15/2024 with bacteremia.  Pharmacy has been consulted for vancomycin dosing.  Per ID note: Was in the hospital between 10/18/2023 until 11/04/2023 for acute pancreatitis but also developed MRSA bacteremia 5 days into hospital stay ( BC positive on 1/9, 1/11 and 1/12) and T8-T9 thoracic vertebral osteomyelitis/discitis and left great toe septic arthritis for which she underwent Left foot first MTP joint resection including head of the first metatarsal and base of proximal phalanx. This was done on 10/30/2023. Culture was positive for MRSA. She came back to [facility] ED on 11/23/2023 complaining of worsening pain and it was hurting for her to talk turn twist or move.She ahs no fever or chills. She has been 100% adherent to Daptomycin . MRI showed epidural abscess and nerve compression after 23 days of IV dapto. She underwent surgery on 11/25/23. She was this time treated with vancomycin and discharged home on it . There has been some fluctuation in vanco levels- intially low and vanco was increased to Q8. Cr has been steady.   Pt received vancomycin per RN 4/3 at 0500. Vancomycin random this morning was 32.    Plan: Pt was on vancomycin 1250 mg TID. Will get a 12 hour level. Goal Vancmycin trough 18-20 mcg/mL.   Height: 5' (152.4 cm) Weight: 49.9 kg (110 lb) IBW/kg (Calculated) : 45.5  Temp (24hrs), Avg:98.7 F (37.1 C), Min:98.7 F (37.1 C), Max:98.7 F (37.1 C)  Recent Labs  Lab 01/15/24 0637 01/15/24 1010  WBC 9.0  --   CREATININE 0.87  --   VANCORANDOM  --  32    Estimated Creatinine Clearance: 57.4 mL/min (by C-G formula based on SCr of 0.87 mg/dL).    Allergies  Allergen Reactions   Diclofenac Hives     Thank you for allowing pharmacy to be a part of this patient's care.  Ronnald Ramp, PharmD, BCPS 01/15/2024 11:56 AM

## 2024-01-16 ENCOUNTER — Telehealth (HOSPITAL_COMMUNITY): Payer: Self-pay

## 2024-01-16 ENCOUNTER — Other Ambulatory Visit (HOSPITAL_COMMUNITY): Payer: Self-pay

## 2024-01-16 DIAGNOSIS — M545 Low back pain, unspecified: Secondary | ICD-10-CM | POA: Diagnosis not present

## 2024-01-16 DIAGNOSIS — M4646 Discitis, unspecified, lumbar region: Secondary | ICD-10-CM

## 2024-01-16 DIAGNOSIS — M8600 Acute hematogenous osteomyelitis, unspecified site: Secondary | ICD-10-CM

## 2024-01-16 DIAGNOSIS — M4626 Osteomyelitis of vertebra, lumbar region: Principal | ICD-10-CM

## 2024-01-16 LAB — BASIC METABOLIC PANEL WITH GFR
Anion gap: 12 (ref 5–15)
BUN: 11 mg/dL (ref 6–20)
CO2: 23 mmol/L (ref 22–32)
Calcium: 10.1 mg/dL (ref 8.9–10.3)
Chloride: 102 mmol/L (ref 98–111)
Creatinine, Ser: 0.59 mg/dL (ref 0.44–1.00)
GFR, Estimated: >60 mL/min (ref >60)
Glucose, Bld: 159 mg/dL — ABNORMAL HIGH (ref 70–99)
Potassium: 4.1 mmol/L (ref 3.5–5.1)
Sodium: 137 mmol/L (ref 135–145)

## 2024-01-16 LAB — SURGICAL PCR SCREEN
MRSA, PCR: NEGATIVE
Staphylococcus aureus: NEGATIVE

## 2024-01-16 LAB — GLUCOSE, CAPILLARY
Glucose-Capillary: 151 mg/dL — ABNORMAL HIGH (ref 70–99)
Glucose-Capillary: 231 mg/dL — ABNORMAL HIGH (ref 70–99)
Glucose-Capillary: 210 mg/dL — ABNORMAL HIGH (ref 70–99)

## 2024-01-16 LAB — VANCOMYCIN, RANDOM: Vancomycin Rm: 30 ug/mL

## 2024-01-16 MED ORDER — VANCOMYCIN HCL 750 MG/150ML IV SOLN
750.0000 mg | Freq: Two times a day (BID) | INTRAVENOUS | Status: DC
  Administered 2024-01-16 – 2024-01-21 (×9): 750 mg via INTRAVENOUS
  Filled 2024-01-16 (×13): qty 150

## 2024-01-16 NOTE — Progress Notes (Signed)
 PROGRESS NOTE  Danielle Harrison    DOB: February 01, 1976, 48 y.o.  WGN:[id number]    Code Status: Full Code   DOA: 01/15/2024   LOS: 1   Brief hospital course  Danielle Harrison is a 48 y.o. 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  Patient reported that she has been compliant with IV antibiotics 3 times a day and she also reported that about 9 to 10 days ago vancomycin dosage was increased from 1500 to 2500 mg every 8 hours.  3 days ago, she started develop new onset of lower back pain, she reported the location is lower than the thoracic spine pain she had 2 months ago, gradually getting worse but she denied any weakness of her legs and no trouble urinating or making bowel movements.  Denied any fever or chills. ED Course: Afebrile, blood pressure 120/90, WBC 9.0, hemoglobin 12.8, K4.1, creatinine 0.8 glucose 220.  MRI of the lumbar spine showed positive L1-L2 discitis osteomyelitis and L2-L3 spinous process osteomyelitis which is new compared to MRI on January 9 of this year.   Patient was given vancomycin and cefepime in the ED.  ID following. IR/neurosurgery was consulted for possible aspiration but was discontinued due to not having enough fluid collection for intervention.   Assessment & Plan  Principal Problem:   Osteomyelitis  (HCC) Active Problems:   Acute osteomyelitis of lumbar spine (HCC)  Acute lumbar osteomyelitis/discitis -while maintaining good antibiotic adherence outpatient - ID managing  -Continue IV vancomycin and rifampin - per IR, not enough fluid collection seen on imaging for aspiration   Asthma -No symptoms signs of flareup, continue as needed bronchodilators   HTN -Metoprolol   IIDM with hyperglycemia -Most recent A1c 6.4 in January -SSI while holding off Metformin  Body mass index is 21.48 kg/m.  VTE ppx: SCDs Start: 01/15/24 7846   Diet:     Diet   Diet heart healthy/carb modified Room service appropriate? Yes; Fluid consistency: Thin   Consultants: ID IR  Subjective 01/16/24    Pt reports improvement in pain although still present. She has not had difficulty with ambulating to bathroom.    Objective   Vitals:   01/15/24 1840 01/15/24 1909 01/15/24 2303 01/16/24 0341  BP: 128/87 122/87 119/80 (!) 122/93  Pulse: 90 90 80 75  Resp: 16 18 18 16   Temp:  97.6 F (36.4 C) 98.4 F (36.9 C) 98.5 F (36.9 C)  TempSrc:  Oral Oral Oral  SpO2: 98% 97% 100% 97%  Weight:      Height:        Intake/Output Summary (Last 24 hours) at 01/16/2024 0808 Last data filed at 01/16/2024 0300 Gross per 24 hour  Intake 263.94 ml  Output 400 ml  Net -136.06 ml   Filed Weights   01/15/24 0542  Weight: 49.9 kg     Physical Exam:  General: awake, alert, NAD Respiratory: normal respiratory effort. Cardiovascular: quick capillary refill Gastrointestinal: soft, NT, ND Nervous: A&O x3. no gross focal neurologic  deficits, normal speech Extremities: moves all equally, no edema, normal tone Skin: dry, intact, normal temperature, normal color. No rashes, lesions or ulcers on exposed skin Psychiatry: normal mood, congruent affect  Labs   I have personally reviewed the following labs and imaging studies CBC    Component Value Date/Time   WBC 9.0 01/15/2024 0637   RBC 4.26  01/15/2024 0637   HGB 12.8 01/15/2024 0637   HGB 14.2 03/01/2014 1059   HCT 39.2 01/15/2024 0637   HCT 42.1 03/01/2014 1059   PLT 410 (H) 01/15/2024 0637   PLT 324 03/01/2014 1059   MCV 92.0 01/15/2024 0637   MCV 96 03/01/2014 1059   MCH 30.0 01/15/2024 0637   MCHC 32.7 01/15/2024 0637   RDW 12.4 01/15/2024 0637   RDW 12.5 03/01/2014 1059   LYMPHSABS 2.6 11/25/2023 0806   LYMPHSABS 2.0 03/01/2014 1059   MONOABS 0.9 11/25/2023 0806   MONOABS 0.5 03/01/2014 1059   EOSABS 0.6 (H) 11/25/2023 0806   EOSABS 0.1 03/01/2014 1059   BASOSABS 0.1 11/25/2023 0806   BASOSABS 0.1 03/01/2014 1059      Latest Ref Rng & Units 01/16/2024    3:30 AM 01/15/2024    6:37 AM 11/27/2023    4:52 AM  BMP  Glucose 70 - 99 mg/dL 604  540    BUN 6 - 20 mg/dL 11  10    Creatinine 9.81 - 1.00 mg/dL 1.91  4.78  2.95   Sodium 135 - 145 mmol/L 137  135    Potassium 3.5 - 5.1 mmol/L 4.1  4.1    Chloride 98 - 111 mmol/L 102  101    CO2 22 - 32 mmol/L 23  24    Calcium 8.9 - 10.3 mg/dL 62.1  9.9      MR Lumbar Spine W Wo Contrast Result Date: 01/15/2024 CLINICAL DATA:  48 year old female with back pain. "Back surgery in February, had infection in her spine". MRSA. Discitis osteomyelitis at T6 through T8 in January and February. EXAM: MRI LUMBAR SPINE WITHOUT AND WITH CONTRAST TECHNIQUE: Multiplanar and multiecho pulse sequences of the lumbar spine were obtained without and with intravenous contrast. CONTRAST:  5mL GADAVIST GADOBUTROL 1 MMOL/ML IV SOLN COMPARISON:  Thoracic MRI 11/23/2023.  Lumbar MRI 10/23/2023. FINDINGS: Segmentation: Normal, concordant with the numbering in January in February. Alignment: Stable since January. Lumbar lordosis with anterolisthesis of L4 on L5 measuring 7-8 mm. Lesser grade 1 chronic spondylolisthesis at L3-L4 measuring 3-4 mm. Vertebrae: Confluent new marrow edema anteriorly at the L1 and L2 vertebral bodies, more pronounced at the latter. New STIR hyperintensity and enhancement at  both of those levels. And intervening L1-L2 disc is now more STIR hyperintense, although not obviously enhancing. Furthermore, there is evidence of abnormal marrow edema and enhancement in the L2 and L3 spinous processes as seen on series 10, image 7 and series 6, image 7. Enhancing interspinous ligament there also. But L4-L5 STIR hyperintense endplate marrow edema asymmetric to the right is stable since January and appears to be degenerative in nature. Background bone marrow signal remains normal. Visible sacrum and SI joints remain normal. Conus medullaris and cauda equina: Conus extends to the T12-L1 level. No lower spinal cord or conus signal abnormality. No abnormal intradural enhancement. Cauda equina nerve roots appear stable and normal. No lumbar dural thickening. Paraspinal and other soft tissues: Despite the multifocal new marrow edema detailed above there is no convincing paraspinal muscle edema, inflammation, or fluid collection. And the lumbar epidural space  appears to remain normal. Negative visible abdominal viscera. Disc levels: Stable degeneration since January, primarily L3-L4 and L4-L5 in the setting of chronic spondylolisthesis at those levels. There is mild chronic spinal stenosis at L4-L5, multifactorial moderate to severe bilateral lateral recess and right foraminal stenosis at that level. IMPRESSION: 1. Positive for L1-L2 Discitis Osteomyelitis, and L2-L3 spinous process Osteomyelitis, which are new since the Lumbar MRI on 10/23/2023. 2. Negative for epidural or paraspinal abscess. 3. 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 Electronically Signed   By: Odessa Fleming M.D.   On: 01/15/2024 07:42    Disposition Plan & Communication  Patient status: Inpatient  Admitted From: Home Planned disposition location: Home Anticipated discharge date: TBD pending ID clearance   Family Communication: friend at  bedside    Author: Leeroy Bock, DO [facility] 01/16/2024, 8:08 AM   Available by Epic secure chat 7AM-7PM. If 7PM-7AM, please contact night-coverage.  [facility] contact information found on [email or web address].

## 2024-01-16 NOTE — Progress Notes (Signed)
 Date of Admission:  01/15/2024      ID: Danielle Harrison is a 48 y.o. female Principal Problem:   Osteomyelitis (HCC) Active Problems:   Acute osteomyelitis of lumbar spine (HCC)    Subjective: Back pain better  Medications:   Chlorhexidine Gluconate Cloth  6 each Topical Daily   insulin aspart  0-9 Units Subcutaneous TID WC   metoprolol tartrate  100 mg Oral BID   mometasone-formoterol  2 puff Inhalation BID   polyethylene glycol  17 g Oral Daily   rifampin  300 mg Oral Q12H   sodium chloride flush  10-40 mL Intracatheter Q12H    Objective: Vital signs in last 24 hours: Patient Vitals for the past 24 hrs:  BP Temp Temp src Pulse Resp SpO2  01/16/24 0801 (!) 132/91 98.2 F (36.8 C) Oral 80 16 100 %  01/16/24 0341 (!) 122/93 98.5 F (36.9 C) Oral 75 16 97 %  01/15/24 2303 119/80 98.4 F (36.9 C) Oral 80 18 100 %  01/15/24 1909 122/87 97.6 F (36.4 C) Oral 90 18 97 %  01/15/24 1840 128/87 -- -- 90 16 98 %  01/15/24 1615 113/65 -- -- 89 16 95 %  01/15/24 1612 113/65 -- -- -- 18 100 %  01/15/24 1228 124/88 98.1 F (36.7 C) Oral 74 18 98 %     LDA Rt PICC  PHYSICAL EXAM:  General: Alert, cooperative, no distress, appears stated age.  Head: Normocephalic, without obvious abnormality, atraumatic. Eyes: Conjunctivae clear, anicteric sclerae. Pupils are equal ENT Nares normal. No drainage or sinus tenderness. Lips, mucosa, and tongue normal. No Thrush Neck: Supple, symmetrical, no adenopathy, thyroid: non tender no carotid bruit and no JVD. Back: thoracic scar healed well. Lungs: Clear to auscultation bilaterally. No Wheezing or Rhonchi. No rales. Heart: Regular rate and rhythm, no murmur, rub or gallop. Abdomen: Soft, non-tender,not distended. Bowel sounds normal. No masses Extremities: atraumatic, no cyanosis. No edema. No clubbing Skin: No rashes or lesions. Or bruising Lymph: Cervical, supraclavicular normal. Neurologic: Grossly non-focal  Lab Results     Latest Ref Rng & Units 01/15/2024    6:37 AM 11/27/2023    4:52 AM 11/25/2023    8:06 AM  CBC  WBC 4.0 - 10.5 K/uL 9.0  13.9  10.4   Hemoglobin 12.0 - 15.0 g/dL 16.1  9.1  09.6   Hematocrit 36.0 - 46.0 % 39.2  27.5  31.2   Platelets 150 - 400 K/uL 410  471  548        Latest Ref Rng & Units 01/16/2024    3:30 AM 01/15/2024    6:37 AM 11/27/2023    4:52 AM  CMP  Glucose 70 - 99 mg/dL 045  409    BUN 6 - 20 mg/dL 11  10    Creatinine 8.11 - 1.00 mg/dL 9.14  7.82  9.56   Sodium 135 - 145 mmol/L 137  135    Potassium 3.5 - 5.1 mmol/L 4.1  4.1    Chloride 98 - 111 mmol/L 102  101    CO2 22 - 32 mmol/L 23  24    Calcium 8.9 - 10.3 mg/dL 21.3  9.9    Total Protein 6.5 - 8.1 g/dL  7.6    Total Bilirubin 0.0 - 1.2 mg/dL  0.5    Alkaline Phos 38 - 126 U/L  121    AST 15 - 41 U/L  16    ALT 0 - 44 U/L  9        Microbiology:  Studies/Results: MR Lumbar Spine W Wo Contrast Result Date: 01/15/2024 CLINICAL DATA:  48 year old female with back pain. "Back surgery in February, had infection in her spine". MRSA. Discitis osteomyelitis at T6 through T8 in January and February. EXAM: MRI LUMBAR SPINE WITHOUT AND WITH CONTRAST TECHNIQUE: Multiplanar and multiecho pulse sequences of the lumbar spine were obtained without and with intravenous contrast. CONTRAST:  5mL GADAVIST GADOBUTROL 1 MMOL/ML IV SOLN COMPARISON:  Thoracic MRI 11/23/2023.  Lumbar MRI 10/23/2023. FINDINGS: Segmentation: Normal, concordant with the numbering in January in February. Alignment: Stable since January. Lumbar lordosis with anterolisthesis of L4 on L5 measuring 7-8 mm. Lesser grade 1 chronic spondylolisthesis at L3-L4 measuring 3-4 mm. Vertebrae: Confluent new marrow edema anteriorly at the L1 and L2 vertebral bodies, more pronounced at the latter. New STIR hyperintensity and enhancement at both of those levels. And intervening L1-L2 disc is now more STIR hyperintense, although not obviously enhancing. Furthermore, there is evidence  of abnormal marrow edema and enhancement in the L2 and L3 spinous processes as seen on series 10, image 7 and series 6, image 7. Enhancing interspinous ligament there also. But L4-L5 STIR hyperintense endplate marrow edema asymmetric to the right is stable since January and appears to be degenerative in nature. Background bone marrow signal remains normal. Visible sacrum and SI joints remain normal. Conus medullaris and cauda equina: Conus extends to the T12-L1 level. No lower spinal cord or conus signal abnormality. No abnormal intradural enhancement. Cauda equina nerve roots appear stable and normal. No lumbar dural thickening. Paraspinal and other soft tissues: Despite the multifocal new marrow edema detailed above there is no convincing paraspinal muscle edema, inflammation, or fluid collection. And the lumbar epidural space appears to remain normal. Negative visible abdominal viscera. Disc levels: Stable degeneration since January, primarily L3-L4 and L4-L5 in the setting of chronic spondylolisthesis at those levels. There is mild chronic spinal stenosis at L4-L5, multifactorial moderate to severe bilateral lateral recess and right foraminal stenosis at that level. IMPRESSION: 1. Positive for L1-L2 Discitis Osteomyelitis, and L2-L3 spinous process Osteomyelitis, which are new since the Lumbar MRI on 10/23/2023. 2. Negative for epidural or paraspinal abscess. 3. 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 Electronically Signed   By: Odessa Fleming M.D.   On: 01/15/2024 07:42   April 2025  Feb 2025   L1-L2 discitis osteo L2_l3 spinous process osteo  Assessment/Plan: New Lumbar back pain with L1-L2 marrow edema questioning osteomyelitis While on Vanco and rifampin and having therapeutic values and also normal ESR and CRP on 4/1 It is strange that the progression is happening on already 6 weeks of IV Thought the thoracic  MRI from Feb did show L2 vertebral body edema Discussed with IR Dr.Hassell, they can aspirate the disc or the spinous process for culture On Monday- NPO after midnight sunday   T6-T7 discitis with fusion surgery Pt is on vanco + rifampin and it will be 8 weeks on 01/19/24, may have to extend it   DM   H/o pancreatitis  Discussed the management with patient and care team ID will follow her peripherally this weekend- call if needed

## 2024-01-16 NOTE — Progress Notes (Addendum)
 Hospital does not have any SCDs available. Encourage pt ambulation and offer compression stockings.   IV team consult placed for patient w/ establish PICC for home IV abx infusions. No blood return but flushes. Pt states Home Infusion RN cannot pull back blood either.   Update 0500: TED hose placed on patient. IV team came to troubleshoot PICC. Extension line removed and blood return obtained. RN collected morning lab.

## 2024-01-16 NOTE — Telephone Encounter (Signed)
 Pharmacy Patient Advocate Encounter  Insurance verification completed.    The patient is insured through [employer].     Ran test claim for Linezolid and the current 30 day co-pay is $4.00.   This test claim was processed through [facility]- copay amounts may vary at other pharmacies due to pharmacy/plan contracts, or as the patient moves through the different stages of their insurance plan.

## 2024-01-16 NOTE — Consult Note (Signed)
 Referring Physician:  No referring provider defined for this encounter.  Primary Physician:  Danielle Ivan, NP  Chief Complaint:  increasing back pain  History of Present Illness: Danielle Harrison is a 48 y.o. female who presents with the chief complaint of increasing back pain.  This has been increasing over the past week.  Patient h/o pancreatitis, MRSA bacteremia, asthma, anxietybeing treated for MRSA spine infection and on IV vanco and Po rifampin presents with low back pain of 3 days duration. She is currently on IV vanco and PO rifampin being treated for MRSA infection of the thoracic spine.  She underwent thoracic decompression and fusion on 11/25/2023.   The symptoms are causing a significant impact on the patient's life.   No bowel or bladder issues or weakness.  Review of Systems:  A 10 point review of systems is negative, except for the pertinent positives and negatives detailed in the HPI.  Past Medical History: Past Medical History:  Diagnosis Date   Anxiety    Arthritis    back   Asthma    HTN (hypertension)    Neuromuscular disorder (HCC)    weakness and numbness    Restless leg syndrome     Past Surgical History: Past Surgical History:  Procedure Laterality Date   APPLICATION OF INTRAOPERATIVE CT SCAN N/A 11/25/2023   Procedure: APPLICATION OF INTRAOPERATIVE CT SCAN;  Surgeon: Lovenia Kim, MD;  Location: [facility];  Service: Neurosurgery;  Laterality: N/A;   BIOPSY  06/28/2019   Procedure: BIOPSY;  Surgeon: Pasty Spillers, MD;  Location: [facility];  Service: Endoscopy;;   CESAREAN SECTION     x2   COLONOSCOPY WITH PROPOFOL N/A 06/28/2019   Procedure: COLONOSCOPY WITH PROPOFOL;  Surgeon: Pasty Spillers, MD;  Location: [facility];  Service: Endoscopy;  Laterality: N/A;   ESOPHAGOGASTRODUODENOSCOPY (EGD) WITH PROPOFOL N/A 06/28/2019   Procedure: ESOPHAGOGASTRODUODENOSCOPY (EGD) WITH PROPOFOL;  Surgeon: Pasty Spillers, MD;  Location: [facility];  Service: Endoscopy;  Laterality: N/A;   INCISION AND DRAINAGE Left 10/27/2023   Procedure: INCISION AND DRAINAGE LEFT FOOT;  Surgeon: Pilar Plate, DPM;  Location: [facility];  Service: Orthopedics/Podiatry;  Laterality: Left;   INCISION AND DRAINAGE Left 10/30/2023   Procedure: LEFT FOOT FIRST METATARSAL PHALANGEAL JOINT RESECTION, ANTIBIOTIC SPACER, WASHOUT, WOUND CLOSURE;  Surgeon: Pilar Plate, DPM;  Location: [facility];  Service: Orthopedics/Podiatry;  Laterality: Left;   TEE WITHOUT CARDIOVERSION N/A 10/29/2023   Procedure: TRANSESOPHAGEAL ECHOCARDIOGRAM (TEE);  Surgeon: Antonieta Iba, MD;  Location: [facility];  Service: Cardiovascular;  Laterality: N/A;   THORACIC LAMINECTOMY FOR EPIDURAL ABSCESS N/A 11/25/2023   Procedure: T6-T7 thoracic laminectomy, right sided transpedicular decompression and disc debridement;  Surgeon: Lovenia Kim, MD;  Location: [facility];  Service: Neurosurgery;  Laterality: N/A;   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   Procedure: LEFT FOOT 1ST MPJ RESECTION;  Surgeon: Pilar Plate, DPM;  Location: [facility];  Service: Orthopedics/Podiatry;  Laterality: Left;   TRANSMETATARSAL AMPUTATION Left 10/30/2023   Procedure: LEFT FOOT 1ST MPJ RESECTION;  Surgeon: Pilar Plate, DPM;  Location: [facility];  Service: Orthopedics/Podiatry;  Laterality: Left;    Problem List: Patient Active Problem List   Diagnosis Date Noted   Discitis of lumbar region 01/16/2024   Osteomyelitis of lumbar spine (HCC) 01/16/2024   Acute osteomyelitis of lumbar spine (HCC) 01/15/2024   Osteomyelitis (HCC) 01/15/2024   Type 2 diabetes mellitus without complication, without long-term current use of  insulin (HCC) 12/17/2023   MRSA infection 11/27/2023   Pathologic fracture of thoracic vertebrae, initial encounter 11/25/2023   Acute osteomyelitis of thoracic spine (HCC) 11/24/2023   Hyperkalemia  11/23/2023   Epidural abscess 11/23/2023   HTN (hypertension)    Acute bilateral back pain 11/21/2023   Acute osteomyelitis of left foot (HCC) 10/30/2023   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   Osteomyelitis of thoracic spine (HCC) 10/28/2023   Acute hematogenous osteomyelitis of left foot (HCC) 10/27/2023   Abscess of left foot 10/27/2023   MRSA bacteremia 10/27/2023   Septic arthritis of left foot (HCC) 10/26/2023   Small bowel intussusception (HCC) 10/19/2023   Pancreatitis 10/19/2023   Abdominal pain 01/29/2022   Acute pancreatitis 02/27/2021   Chronic pain 02/27/2021   Asthma 02/27/2021   Stomach ache    Diarrhea    Stomach irritation     Allergies: Allergies as of 01/15/2024 - Review Complete 01/15/2024  Allergen Reaction Noted   Diclofenac Hives 06/02/2022    Medications: @ENCMED @  Social History: Social History   Tobacco Use   Smoking status: Former    Current packs/day: 0.50    Average packs/day: 0.5 packs/day for 6.0 years (3.0 ttl pk-yrs)    Types: Cigarettes   Smokeless tobacco: Never   Tobacco comments:    Quit 8/22  Vaping Use   Vaping status: Every Day   Substances: Nicotine  Substance Use Topics   Alcohol use: Not Currently    Comment: 3 times per week   Drug use: Never    Family Medical History: Family History  Problem Relation Age of Onset   Colon cancer Neg Hx     Physical Examination: @VITALWITHPAIN @  General: Patient is well developed, well nourished, calm, collected, and in no apparent distress.  Psychiatric: Patient is non-anxious.  Head:  Pupils equal, round, and reactive to light.  ENT:  Oral mucosa appears well hydrated.  Neck:   Supple.  Full range of motion.  Respiratory: Patient is breathing without any difficulty.  Extremities: No edema.  Vascular: Palpable pulses.  Skin:   On exposed skin, there are no abnormal skin lesions.  NEUROLOGICAL:  General: In no acute distress.   Awake, alert, oriented to  person, place, and time.  Pupils equal round and reactive to light.  Facial tone is symmetric.  Tongue protrusion is midline.  There is no pronator drift.    Strength: Side Biceps Triceps Deltoid Interossei Grip Wrist Ext. Wrist Flex.  R 5 5 5 5 5 5 5   L 5 5 5 5 5 5 5    Side Iliopsoas Quads Hamstring PF DF EHL  R 5 5 5 5 5 5   L 5 5 5 5 5 5    Reflexes are 2+ and symmetric at the biceps, triceps, brachioradialis, patella and achilles.   Bilateral upper and lower extremity sensation is intact to light touch and pin prick.  Clonus is not present.  Toes are down-going.Hoffman's is absent. MPRESSION: 1. Positive for L1-L2 Discitis Osteomyelitis, and L2-L3 spinous process Osteomyelitis, which are new since the Lumbar MRI on 10/23/2023. 2. Negative for epidural or paraspinal abscess. 3. 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 Imaging:   I have personally reviewed the images and agree with the above interpretation.  Assessment and Plan: Danielle Harrison is a pleasant 48 y.o. female with increasing back pain.    I have discussed the condition with the patient, including showing the radiographs  and discussing treatment options in layman's terms.  The patient may benefit from conservative management.  Thus, I have recommended the following:   1) No acute surgical interventions warranted. 2) Continue with antibiotics per ID recommendations.  .  I will see the patient back in a few weeks to gauge progress.  Thank you for involving me in the care of this patient. I will keep you apprised of the patient's progress.   This note was partially dictated using voice recognition software, so please excuse any errors that were not corrected.  Peterson Ao MD, PhD

## 2024-01-16 NOTE — Consult Note (Addendum)
 Pharmacy Antibiotic Note  Danielle Harrison is a 48 y.o. female admitted on 01/15/2024 with bacteremia.  Pharmacy has been consulted for vancomycin dosing for continuation of therapy for vertebral osteomyelitis and epidural abscess.  Patient presents 4/3 with increasing back pain and MRI showed new L1-2 marrow edema with concern for osteomyelitis  - Pt received vancomycin per RN 4/3 at 0500. Vancomycin random this morning was 32.    HPI - Per ID note: Was in the hospital between 10/18/2023 until 11/04/2023 for acute pancreatitis but also developed MRSA bacteremia 5 days into hospital stay ( BC positive on 1/9, 1/11 and 1/12) and T8-T9 thoracic vertebral osteomyelitis/discitis and left great toe septic arthritis for which she underwent Left foot first MTP joint resection including head of the first metatarsal and base of proximal phalanx. This was done on 10/30/2023. Culture was positive for MRSA. She came back to [facility] ED on 11/23/2023 complaining of worsening pain and it was hurting for her to talk turn twist or move.She ahs no fever or chills. She has been 100% adherent to Daptomycin. MRI showed epidural abscess and nerve compression after 23 days of IV daptomycin. She underwent surgery on 11/25/23. She was this time treated with vancomycin and discharged home on it . There has been some fluctuation in vanco levels- intially low and vanco was increased to Q8. SCr has been steady.   Today, 01/16/2024 On vancomycin and rifampin prior to admission - to complete 6 weeks of therapy on 01/19/2024 Renal: Scr 0.59 (this looks to be closer to baseline, was 0.87 at admit) WBC WNL Afebrile 4/3 blood cx: NGTD  Vancomycin levels:  Per home infusion, patient to be receiving vancomycin 1250mg  IV q8h.  Last dose prior to presentation to ED was 4/3 at 0500 4/3 vancomycin random level at 1010= 32 mcg/ml 4/3 vancomycin random level at 1718 = 20 mcg/ml  This calculates to a half-life of 10.3h (ke 0.067) 4/3 vancomycin 1250mg  IV x  1 given at 2042 4/4 vancomycin level at 0656 = 30 mcg/ml  Plan: Attempt to resume vancomycin at a dose of vancomycin 750mg  IV q12h, starting at 1600.  To achieve a trough of ~63mcg/ml (goal trough 18-20 per ID)  This is based on using calculated half-life above and the fact that vancomycin level today was 30 mcg/ml approximately 10h after dose given last night - making it appear lower dose needed (achieving too high of peaks with 1250mg ).   Follow renal function closely Check vanco peak and trough with 4th dose (ie. 4am dose on 4/6) Await plan for antibiotics per ID - was to complete therapy 4/7 but new MRI findings may change that plan  Height: 5' (152.4 cm) Weight: 49.9 kg (110 lb) IBW/kg (Calculated) : 45.5  Temp (24hrs), Avg:98.2 F (36.8 C), Min:97.6 F (36.4 C), Max:98.5 F (36.9 C)  Recent Labs  Lab 01/15/24 0637 01/15/24 1010 01/15/24 1718 01/16/24 0330 01/16/24 0656  WBC 9.0  --   --   --   --   CREATININE 0.87  --   --  0.59  --   VANCORANDOM  --    < > 20  --  30   < > = values in this interval not displayed.    Estimated Creatinine Clearance: 62.4 mL/min (by C-G formula based on SCr of 0.59 mg/dL).    Allergies  Allergen Reactions   Diclofenac Hives    Thank you for allowing pharmacy to be a part of this patient's care.  Amalia Hailey  Suzette Battiest, PharmD, BCPS, BCIDP Work Cell: [phone] 01/16/2024 9:41 AM

## 2024-01-17 DIAGNOSIS — M8618 Other acute osteomyelitis, other site: Secondary | ICD-10-CM | POA: Diagnosis not present

## 2024-01-17 LAB — GLUCOSE, CAPILLARY
Glucose-Capillary: 190 mg/dL — ABNORMAL HIGH (ref 70–99)
Glucose-Capillary: 174 mg/dL — ABNORMAL HIGH (ref 70–99)
Glucose-Capillary: 234 mg/dL — ABNORMAL HIGH (ref 70–99)
Glucose-Capillary: 134 mg/dL — ABNORMAL HIGH (ref 70–99)

## 2024-01-17 LAB — VANCOMYCIN, TROUGH: Vancomycin Tr: 17 ug/mL (ref 15–20)

## 2024-01-17 LAB — CREATININE, SERUM
Creatinine, Ser: 0.72 mg/dL (ref 0.44–1.00)
GFR, Estimated: >60 mL/min (ref >60)

## 2024-01-17 MED ORDER — HYDROMORPHONE HCL 1 MG/ML IJ SOLN
0.5000 mg | INTRAMUSCULAR | Status: DC | PRN
  Administered 2024-01-17 – 2024-01-20 (×13): 0.5 mg via INTRAVENOUS
  Filled 2024-01-17 (×13): qty 0.5

## 2024-01-17 NOTE — Progress Notes (Signed)
 PROGRESS NOTE    Danielle Harrison  UXL:[id number] DOB: 03/15/1976 DOA: 01/15/2024 PCP: [name], NP    Brief Narrative:  48 y.o. 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  Patient reported that she has been compliant with IV antibiotics 3 times a day and she also reported that about 9 to 10 days ago vancomycin dosage was increased from 1500 to 2500 mg every 8 hours.  3 days ago, she started develop new onset of lower back pain, she reported the location is lower than the thoracic spine pain she had 2 months ago, gradually getting worse but she denied any weakness of her legs and no trouble urinating or making bowel movements.  Denied any fever or chills.   ID following. IR/neurosurgery was consulted for possible aspiration but was discontinued due to not having enough fluid collection for intervention.    Assessment & Plan:   Principal Problem:   Osteomyelitis (HCC) Active Problems:   Acute osteomyelitis of lumbar spine (HCC)   Discitis of lumbar region   Osteomyelitis of lumbar spine (HCC)  Acute lumbar osteomyelitis/discitis -while maintaining good antibiotic adherence outpatient - ID managing  Plan: -Continue IV vancomycin and rifampin - per IR, not enough fluid collection seen on imaging for aspiration -Multimodal  pain regimen   Asthma, unknown severity -No symptoms signs of flareup, continue as needed bronchodilators   Hypertension -Metoprolol   Type 2 diabetes mellitus with hyperglycemia -Most recent A1c 6.4 in January -SSI while holding off Metformin   DVT prophylaxis: Lovenox Code Status: Full Family Communication:None Disposition Plan: Status is: Inpatient Remains inpatient appropriate because: Acute issues as above   Level of care: Med-Surg  Consultants:  ID Neurosurgery  Procedures:  None  Antimicrobials: Vancomycin Rifampin   Subjective: Seen and examined.  Sitting up in bed.  Continues to complain of back pain otherwise stable.  Objective: Vitals:   01/17/24 0057 01/17/24 0332 01/17/24 0745 01/17/24 1129  BP: (!) 114/93 114/82 (!) 121/93 123/85  Pulse: 79 79 86 78  Resp: 20 18 18 18   Temp: 98.1 F (36.7 C) 98 F (36.7 C) 98.1 F (36.7 C) 98.1 F (36.7 C)  TempSrc: Oral Oral Oral Oral  SpO2: 100% 99% 100% 100%  Weight:      Height:        Intake/Output Summary (Last 24 hours) at 01/17/2024 1236 Last data filed at 01/16/2024 2259 Gross per 24 hour  Intake 410 ml  Output --  Net 410 ml   Filed Weights   01/15/24 0542  Weight: 49.9 kg    Examination:  General exam: Appears calm and comfortable  Respiratory system: Clear to auscultation. Respiratory effort normal. Cardiovascular system: S1-S2, RRR, no murmurs, no pedal edema Gastrointestinal system: Soft, NT/ND, normal bowel sounds Central nervous system: Alert and oriented. No focal neurological deficits. Extremities: Decreased power bilateral lower extremities.  Gait not assessed Skin: No rashes, lesions or ulcers Psychiatry: Judgement and insight appear normal. Mood & affect appropriate.     Data Reviewed:  I have personally reviewed following labs and imaging studies  CBC: Recent Labs  Lab 01/15/24 0637  WBC 9.0  HGB 12.8  HCT 39.2  MCV 92.0  PLT 410*   Basic Metabolic Panel: Recent  Labs  Lab 01/15/24 0637 01/16/24 0330 01/17/24 0602  NA 135 137  --   K 4.1 4.1  --   CL 101 102  --   CO2 24 23  --   GLUCOSE 220* 159*  --   BUN 10 11  --   CREATININE 0.87 0.59 0.72  CALCIUM 9.9 10.1  --    GFR: Estimated Creatinine Clearance: 62.4 mL/min (by C-G formula based on SCr of 0.72 mg/dL). Liver Function Tests: Recent Labs  Lab 01/15/24 0637  AST 16  ALT 9  ALKPHOS 121  BILITOT 0.5  PROT 7.6  ALBUMIN 4.1   No results for input(s): "LIPASE", "AMYLASE" in the last 168 hours. No results for input(s): "AMMONIA" in the last 168 hours. Coagulation Profile: No results for input(s): "INR", "PROTIME" in the last 168 hours. Cardiac Enzymes: No results for input(s): "CKTOTAL", "CKMB", "CKMBINDEX", "TROPONINI" in the last 168 hours. BNP (last 3 results) No results for input(s): "PROBNP" in the last 8760 hours. HbA1C: No results for input(s): "HGBA1C" in the last 72 hours. CBG: Recent Labs  Lab 01/16/24 0759 01/16/24 1731 01/16/24 2204 01/17/24 0751 01/17/24 1224  GLUCAP 151* 231* 210* 190* 174*   Lipid Profile: No results for input(s): "CHOL", "HDL", "LDLCALC", "TRIG", "CHOLHDL", "LDLDIRECT" in the last 72 hours. Thyroid Function Tests: No results for input(s): "TSH", "T4TOTAL", "FREET4", "T3FREE", "THYROIDAB" in the last 72 hours. Anemia Panel: No results for input(s): "VITAMINB12", "FOLATE", "FERRITIN", "TIBC", "IRON", "RETICCTPCT" in the last 72 hours. Sepsis Labs: No results for input(s): "PROCALCITON", "LATICACIDVEN" in the last 168 hours.  Recent Results (from the past 240 hours)  Blood culture (routine x 2)     Status: None (Preliminary result)   Collection Time: 01/15/24 10:10 AM   Specimen: BLOOD  Result Value Ref Range Status   Specimen Description BLOOD PICC LINE  Final   Special Requests   Final    BOTTLES DRAWN AEROBIC AND ANAEROBIC Blood Culture results may not be optimal due to an inadequate volume of blood received in culture bottles    Culture   Final    NO GROWTH 2 DAYS Performed at [facility], [address] [place], [place] [address]    Report Status PENDING  Incomplete  Blood culture (routine x 2)     Status: None (Preliminary result)   Collection Time: 01/15/24 10:10 AM   Specimen: BLOOD  Result Value Ref Range Status   Specimen Description BLOOD LEFT ANTECUBITAL  Final   Special Requests   Final    BOTTLES DRAWN AEROBIC AND ANAEROBIC Blood Culture results may not be optimal due to an inadequate volume of blood received in culture bottles   Culture   Final    NO GROWTH 2 DAYS Performed at [facility], [address] [place], [place] [address]    Report Status PENDING  Incomplete  Surgical pcr screen     Status: None   Collection Time: 01/16/24 12:03 PM   Specimen: Nasal Mucosa; Nasal Swab  Result Value Ref Range Status   MRSA, PCR NEGATIVE NEGATIVE Final   Staphylococcus aureus NEGATIVE NEGATIVE Final    Comment: (NOTE) The Xpert SA Assay (FDA approved for NASAL specimens in patients 7 years of age and older), is one component  of a comprehensive surveillance program. It is not intended to diagnose infection nor to guide or monitor treatment. Performed at [facility], [address], [place], [place] [address]          Radiology Studies: No results found.      Scheduled Meds:  Chlorhexidine Gluconate Cloth  6 each Topical Daily   insulin aspart  0-9 Units Subcutaneous TID WC   metoprolol tartrate  100 mg Oral BID   mometasone-formoterol  2 puff Inhalation BID   polyethylene glycol  17 g Oral Daily   rifampin  300 mg Oral Q12H   sodium chloride flush  10-40 mL Intracatheter Q12H   Continuous Infusions:  vancomycin 750 mg (01/17/24 0431)     LOS: 2 days      Tresa Moore, MD [facility]   If 7PM-7AM, please contact night-coverage  01/17/2024, 12:36 PM

## 2024-01-17 NOTE — Progress Notes (Signed)
 Around 0100, Pt called RN into room and stated her pain was 10/10 in her lower back. RN administered PRN Oxycodone at this time. RN also offered to notify MD to get more pain meds on board and patient refused at this ime. Patient states she will try to sleep it off but will notify RN if it gets worse. Charge RN Camelia Eng) also notified at this time.

## 2024-01-17 NOTE — Plan of Care (Signed)

## 2024-01-18 DIAGNOSIS — Z789 Other specified health status: Secondary | ICD-10-CM | POA: Insufficient documentation

## 2024-01-18 DIAGNOSIS — G894 Chronic pain syndrome: Secondary | ICD-10-CM | POA: Insufficient documentation

## 2024-01-18 DIAGNOSIS — M8618 Other acute osteomyelitis, other site: Secondary | ICD-10-CM | POA: Diagnosis not present

## 2024-01-18 DIAGNOSIS — R937 Abnormal findings on diagnostic imaging of other parts of musculoskeletal system: Secondary | ICD-10-CM | POA: Insufficient documentation

## 2024-01-18 DIAGNOSIS — Z79899 Other long term (current) drug therapy: Secondary | ICD-10-CM | POA: Insufficient documentation

## 2024-01-18 DIAGNOSIS — M899 Disorder of bone, unspecified: Secondary | ICD-10-CM | POA: Insufficient documentation

## 2024-01-18 LAB — CREATININE, SERUM
Creatinine, Ser: 0.5 mg/dL (ref 0.44–1.00)
Creatinine, Ser: 0.53 mg/dL (ref 0.44–1.00)
GFR, Estimated: >60 mL/min (ref >60)
GFR, Estimated: >60 mL/min

## 2024-01-18 LAB — GLUCOSE, CAPILLARY
Glucose-Capillary: 195 mg/dL — ABNORMAL HIGH (ref 70–99)
Glucose-Capillary: 190 mg/dL — ABNORMAL HIGH (ref 70–99)
Glucose-Capillary: 227 mg/dL — ABNORMAL HIGH (ref 70–99)
Glucose-Capillary: 239 mg/dL — ABNORMAL HIGH (ref 70–99)

## 2024-01-18 LAB — VANCOMYCIN, TROUGH: Vancomycin Tr: 18 ug/mL (ref 15–20)

## 2024-01-18 MED ORDER — INSULIN ASPART 100 UNIT/ML IJ SOLN
0.0000 [IU] | Freq: Every day | INTRAMUSCULAR | Status: DC
  Administered 2024-01-18: 2 [IU] via SUBCUTANEOUS
  Filled 2024-01-18: qty 1

## 2024-01-18 MED ORDER — SODIUM CHLORIDE 0.9 % IV SOLN
INTRAVENOUS | Status: AC | PRN
Start: 2024-01-18 — End: 2024-01-19

## 2024-01-18 MED ORDER — INSULIN ASPART 100 UNIT/ML IJ SOLN
0.0000 [IU] | Freq: Three times a day (TID) | INTRAMUSCULAR | Status: DC
  Administered 2024-01-18 – 2024-01-20 (×3): 5 [IU] via SUBCUTANEOUS
  Administered 2024-01-20: 3 [IU] via SUBCUTANEOUS
  Administered 2024-01-21: 5 [IU] via SUBCUTANEOUS
  Filled 2024-01-18 (×6): qty 1

## 2024-01-18 MED ORDER — INSULIN ASPART 100 UNIT/ML IJ SOLN: 0.0000 [IU] | Freq: Three times a day (TID) | INTRAMUSCULAR | Status: DC

## 2024-01-18 MED ORDER — INSULIN GLARGINE-YFGN 100 UNIT/ML ~~LOC~~ SOLN
5.0000 [IU] | Freq: Every day | SUBCUTANEOUS | Status: DC
  Administered 2024-01-18 – 2024-01-20 (×3): 5 [IU] via SUBCUTANEOUS
  Filled 2024-01-18 (×4): qty 0.05

## 2024-01-18 NOTE — Progress Notes (Unsigned)
 PROVIDER NOTE: Interpretation of information contained herein should be left to medically-trained personnel. Specific patient instructions are provided elsewhere under "Patient Instructions" section of medical record. This document was created in part using AI and STT-dictation technology, any transcriptional errors that may result from this process are unintentional.  Patient: Danielle Harrison  Service: E/M Encounter  PCP: [name], NP  DOB: 1976-09-19  DOS: 01/19/2024  Provider: Oswaldo Done, MD  MRN: [id number]  Delivery: Face-to-face  Specialty: Interventional Pain Management  Type: New Patient  Setting: Ambulatory outpatient facility  Specialty designation: 09  Referring Prov.: Joan Flores, PA-C  Location: Outpatient office facility     Primary Reason(s) for Visit: Encounter for initial evaluation of one or more chronic problems (new to examiner) potentially causing chronic pain, and posing a threat to normal musculoskeletal function. (Level of risk: High) CC: No chief complaint on file.  HPI  Ms. Kilman is a 48 y.o. year old, female patient, who comes for the first time to our practice referred by Joan Flores, PA-C for our initial evaluation of her chronic pain. She has Stomach ache; Diarrhea; Stomach irritation; Acute pancreatitis; Asthma; Abdominal pain; Small bowel intussusception (HCC); Pancreatitis; Septic arthritis of left foot (HCC); Acute hematogenous osteomyelitis of left foot (HCC); Abscess of left foot; MRSA bacteremia; Osteomyelitis of thoracic spine (HCC); Acute bacterial endocarditis; Acute osteomyelitis of left foot (HCC); Acute bilateral back pain; HTN (hypertension); Hyperkalemia; Epidural abscess; Acute osteomyelitis of thoracic spine (HCC); Pathologic fracture of thoracic vertebrae, initial encounter; MRSA infection; Type 2 diabetes mellitus without complication, without long-term current use of insulin (HCC); Acute osteomyelitis of lumbar spine (HCC); Osteomyelitis  (HCC); Discitis of lumbar region; Osteomyelitis of lumbar spine (HCC); Abnormal MRI, thoracic spine (11/23/2023); Abnormal MRI, lumbar spine (01/15/2024); Pharmacologic therapy; Disorder of skeletal system; Problems influencing health status; and Chronic pain syndrome on their problem list. Today she comes in for evaluation of her No chief complaint on file.  Pain Assessment: Location:     Radiating:   Onset:   Duration:   Quality:   Severity:  /10 (subjective, self-reported pain score)  Effect on ADL:   Timing:   Modifying factors:   BP:    HR:    Onset and Duration: {Hx; Onset and Duration:210120511} Cause of pain: {Hx; Cause:210120521} Severity: {Pain Severity:210120502} Timing: {Symptoms; Timing:210120501} Aggravating Factors: {Causes; Aggravating pain factors:210120507} Alleviating Factors: {Causes; Alleviating Factors:210120500} Associated Problems: {Hx; Associated problems:210120515} Quality of Pain: {Hx; Symptom quality or Descriptor:210120531} Previous Examinations or Tests: {Hx; Previous examinations or test:210120529} Previous Treatments: {Hx; Previous Treatment:210120503}  Ms. Marton is being evaluated for possible interventional pain management therapies for the treatment of her chronic pain.  Discussed the use of AI scribe software for clinical note transcription with the patient, who gave verbal consent to proceed.  History of Present Illness          ***  Ms. Barkett has been informed that this initial visit was an evaluation only.  On the follow up appointment I will go over the results, including ordered tests and available interventional therapies. At that time she will have the opportunity to decide whether to proceed with offered therapies or not. In the event that Ms. Briere prefers avoiding interventional options, this will conclude our involvement in the case.  Medication management recommendations may be provided upon request.  Patient informed that diagnostic  tests may be ordered to assist in identifying underlying causes, narrow the list of differential diagnoses and aid in determining candidacy for (or contraindications  to) planned therapeutic interventions.  Historic Controlled Substance Pharmacotherapy Review  PMP and historical list of controlled substances: Oxycodone Hcl (Ir) 5 Mg Tablet, *** Most recently prescribed opioid analgesics: Oxycodone Hcl (Ir) 5 Mg Tablet, *** MME/day: 37.5 mg/day  Historical Monitoring: The patient  reports no history of drug use. List of prior UDS Testing: Lab Results  Component Value Date   MDMA NEGATIVE 03/01/2014   COCAINSCRNUR NEGATIVE 03/01/2014   PCPSCRNUR NEGATIVE 03/01/2014   THCU POSITIVE 03/01/2014   Historical Background Evaluation: [place] PMP: PDMP reviewed during this encounter. Review of the past 31-months conducted.             PMP NARX Score Report:  Narcotic: 360 Sedative: 180 Stimulant: 000 [place] Department of public safety, offender search: Engineer, mining Information) Non-contributory Risk Assessment Profile: Aberrant behavior: None observed or detected today Risk factors for fatal opioid overdose: None identified today PMP NARX Overdose Risk Score: 400 Fatal overdose hazard ratio (HR): Calculation deferred Non-fatal overdose hazard ratio (HR): Calculation deferred Risk of opioid abuse or dependence: 0.7-3.0% with doses <= 36 MME/day and 6.1-26% with doses >= 120 MME/day. Substance use disorder (SUD) risk level: See below Personal History of Substance Abuse (SUD-Substance use disorder):  Alcohol:    Illegal Drugs:    Rx Drugs:    ORT Risk Level calculation:    ORT Scoring interpretation table:  Score <3 = Low Risk for SUD  Score between 4-7 = Moderate Risk for SUD  Score >8 = High Risk for Opioid Abuse   PHQ-2 Depression Scale:  Total score:    PHQ-2 Scoring interpretation table: (Score and probability of major depressive disorder)  Score 0 = No depression  Score 1 = 15.4% Probability   Score 2 = 21.1% Probability  Score 3 = 38.4% Probability  Score 4 = 45.5% Probability  Score 5 = 56.4% Probability  Score 6 = 78.6% Probability   PHQ-9 Depression Scale:  Total score:    PHQ-9 Scoring interpretation table:  Score 0-4 = No depression  Score 5-9 = Mild depression  Score 10-14 = Moderate depression  Score 15-19 = Moderately severe depression  Score 20-27 = Severe depression (2.4 times higher risk of SUD and 2.89 times higher risk of overuse)   Pharmacologic Plan: As per protocol, I have not taken over any controlled substance management, pending the results of ordered tests and/or consults.            Initial impression: Pending review of available data and ordered tests.  Meds  No current facility-administered medications for this visit. No current outpatient medications on file.  Facility-Administered Medications Ordered in Other Visits:    0.9 %  sodium chloride infusion, , Intravenous, PRN, Lolita Patella B, MD, Stopped at 01/18/24 0410   acetaminophen (TYLENOL) tablet 1,000 mg, 1,000 mg, Oral, Q8H PRN, Mikey College T, MD, 1,000 mg at 01/17/24 0943   albuterol (PROVENTIL) (2.5 MG/3ML) 0.083% nebulizer solution 2.5 mg, 2.5 mg, Inhalation, Q6H PRN, Mikey College T, MD   bisacodyl (DULCOLAX) EC tablet 10 mg, 10 mg, Oral, QHS PRN, Emeline General, MD   Chlorhexidine Gluconate Cloth 2 % PADS 6 each, 6 each, Topical, Daily, Mikey College T, MD, 6 each at 01/18/24 1021   HYDROmorphone (DILAUDID) injection 0.5 mg, 0.5 mg, Intravenous, Q4H PRN, Sreenath, Sudheer B, MD, 0.5 mg at 01/18/24 1435   insulin aspart (novoLOG) injection 0-9 Units, 0-9 Units, Subcutaneous, TID WC, Emeline General, MD, 2 Units at 01/18/24 1231   methocarbamol (ROBAXIN)  tablet 500 mg, 500 mg, Oral, Q6H PRN, Mikey College T, MD, 500 mg at 01/16/24 2303   metoprolol tartrate (LOPRESSOR) tablet 100 mg, 100 mg, Oral, BID, Mikey College T, MD, 100 mg at 01/18/24 0807   mometasone-formoterol (DULERA) 200-5 MCG/ACT  inhaler 2 puff, 2 puff, Inhalation, BID, Mikey College T, MD, 2 puff at 01/18/24 0807   ondansetron (ZOFRAN) tablet 4 mg, 4 mg, Oral, Q6H PRN **OR** ondansetron (ZOFRAN) injection 4 mg, 4 mg, Intravenous, Q6H PRN, Mikey College T, MD   oxyCODONE (Oxy IR/ROXICODONE) immediate release tablet 10 mg, 10 mg, Oral, Q4H PRN, Manuela Schwartz, NP, 10 mg at 01/18/24 1221   polyethylene glycol (MIRALAX / GLYCOLAX) packet 17 g, 17 g, Oral, Daily, Mikey College T, MD, 17 g at 01/18/24 1610   rifampin (RIFADIN) capsule 300 mg, 300 mg, Oral, Q12H, Mikey College T, MD, 300 mg at 01/18/24 1016   sodium chloride flush (NS) 0.9 % injection 10-40 mL, 10-40 mL, Intracatheter, Q12H, Mikey College T, MD, 10 mL at 01/18/24 1020   sodium chloride flush (NS) 0.9 % injection 10-40 mL, 10-40 mL, Intracatheter, PRN, Mikey College T, MD   vancomycin (VANCOREADY) IVPB 750 mg/150 mL, 750 mg, Intravenous, Q12H, Aleda Grana, RPH, Last Rate: 150 mL/hr at 01/18/24 0419, Infusion Verify at 01/18/24 0419  Imaging Review  Cervical Imaging: Cervical MR w/wo contrast: Results for orders placed during the hospital encounter of 10/18/23 MR CERVICAL SPINE W WO CONTRAST  Narrative CLINICAL DATA:  Initial evaluation for severe back pain, MRSA bacteremia.  EXAM: MRI CERVICAL SPINE WITHOUT AND WITH CONTRAST  TECHNIQUE: Multiplanar and multiecho pulse sequences of the cervical spine, to include the craniocervical junction and cervicothoracic junction, were obtained without and with intravenous contrast.  CONTRAST:  5mL GADAVIST GADOBUTROL 1 MMOL/ML IV SOLN  COMPARISON:  Prior study from 06/02/2019.  FINDINGS: Alignment: Straightening of the normal cervical lordosis. No listhesis.  Vertebrae: Vertebral body height maintained without acute or chronic fracture. Bone marrow signal intensity within normal limits. No worrisome osseous lesions. Reactive endplate change with marrow edema and enhancement present about the C3-4 through  C6-7 interspaces. These findings are felt to be most consistent with reactive degenerative endplate changes. No other evidence for osteomyelitis discitis or septic arthritis.  Cord: Normal signal and morphology. No epidural collections or abnormal enhancement.  Partially empty sella noted. Visualized brain and posterior fossa otherwise unremarkable. Craniocervical junction normal. Paraspinous soft tissues within normal limits. Normal flow voids seen within the vertebral arteries bilaterally.  Disc levels:  C2-C3: Mild disc bulge with uncovertebral spurring. Mild left-sided facet hypertrophy. No canal or foraminal stenosis.  C3-C4: Degenerative intervertebral disc space narrowing with diffuse disc osteophyte complex. Flattening of the ventral thecal sac with resultant mild spinal stenosis. Moderate to severe right with moderate left C4 foraminal stenosis.  C4-C5: Disc bulge with bilateral uncovertebral spurring. Flattening of the ventral thecal sac with no more than mild spinal stenosis. Moderate bilateral C5 foraminal narrowing.  C5-C6: Degenerative vertebral disc space narrowing with diffuse disc osteophyte complex. Flattening and partial effacement of the ventral thecal sac with no more than mild spinal stenosis. Severe left with mild right C6 foraminal stenosis.  C6-C7: Degenerative vertebral disc space narrowing with diffuse disc osteophyte complex. Flattening and partial effacement of the ventral thecal sac with no more than mild spinal stenosis. Superimposed mild right-sided facet hypertrophy. Moderate right with mild left C7 foraminal stenosis.  C7-T1: Tiny central disc protrusion minimally indents the ventral thecal sac. Mild facet degeneration.  No spinal stenosis. Foramina remain patent.  IMPRESSION: 1. No MRI evidence for acute infection within the cervical spine. 2. Multilevel cervical spondylosis with resultant mild diffuse spinal stenosis at C3-4 through  C6-7. 3. Multifactorial degenerative changes with resultant multilevel foraminal narrowing as above. Notable findings include moderate to severe right with moderate left C4 foraminal stenosis, moderate bilateral C5 foraminal narrowing, severe left with mild right C6 foraminal stenosis, with moderate right C7 foraminal narrowing.   Electronically Signed By: Rise Mu M.D. On: 10/24/2023 04:44  Thoracic Imaging: Thoracic MR w/wo contrast: Results for orders placed during the hospital encounter of 11/23/23 MR THORACIC SPINE W WO CONTRAST  Narrative CLINICAL DATA:  Follow-up discitis/osteomyelitis at T6-7.  EXAM: MRI THORACIC WITHOUT AND WITH CONTRAST  TECHNIQUE: Multiplanar and multiecho pulse sequences of the thoracic spine were obtained without and with intravenous contrast.  CONTRAST:  5mL GADAVIST GADOBUTROL 1 MMOL/ML IV SOLN  COMPARISON:  MRI 10/23/2023 CT scan 11/23/2023  FINDINGS: Alignment: Normal overall alignment of the thoracic vertebral bodies.  Vertebrae: Diffuse T1 and T2 signal intensity in the T6 and T7 vertebral bodies and further pathologic compression deformities of T6 and T7. The disc space is severely narrowed. There is also fluid in the disc space and diffuse abnormal contrast enhancement. There is also abnormal signal intensity in the T8 vertebral body and marked narrowing of the T7-8 disc space. Findings suspicious for discitis and osteomyelitis at this level also. Associated epidural abscess and extensive epidural enhancing inflammatory phlegmon surrounding and compressing the thoracic spinal cord. This extends from T5-6-2 T8-9.  Is also abnormal signal intensity in the T8 vertebral body and marked narrowing of the T7-8 disc space. Findings suspicious for discitis and osteomyelitis at this level also.  Cord: No definite cord lesions or syrinx. No abnormal cord enhancement.  Paraspinal and other soft tissues: Extensive paraspinal  abscess and inflammatory phlegmon extending from T5-6 down to T8-9.  Disc levels:  No significant findings above the T5-6. As above there is extensive discitis and osteomyelitis at T6-7 and to a lesser extent at T7-8 with paraspinal and epidural abscesses.  Degenerative disc disease with bulging discs and osteophytic ridging in the lower thoracic intervertebral disc spaces. No other sites of discitis or osteomyelitis are identified.  Incidental note is made of Abner T1 and T2 signal intensity in the L2 vertebral body along with abnormal contrast enhancement. Could not exclude osteomyelitis in the L2 vertebral body.  IMPRESSION: 1. Extensive progressive discitis and osteomyelitis at T6-7 and to a lesser extent at T7-8 with paraspinal and epidural abscesses. 2. Extensive epidural abscess and extensive epidural enhancing inflammatory phlegmon surrounding and compressing the thoracic spinal cord at the above levels. 3. No definite cord lesions or syrinx. No abnormal cord enhancement. 4. Incidental note is made of abnormal T1 and T2 signal intensity and abnormal enhancement in the L2 vertebral body suspicious for osteomyelitis.  These results will be called to the ordering clinician or representative by the Radiologist Assistant, and communication documented in the PACS or [employer].   Electronically Signed By: Rudie Meyer M.D. On: 11/23/2023 21:30  Thoracic DG 2-3 views: Results for orders placed during the hospital encounter of 11/23/23 DG Thoracic Spine 2 View  Narrative CLINICAL DATA:  Elective surgery.  EXAM: THORACIC SPINE 2 VIEWS  COMPARISON:  Preoperative MRI.  FINDINGS: Seven fluoroscopic spot views of the thoracic spine submitted from the operating room. Surgical instruments localize over the midthoracic spine with multilevel pedicle screw fixation. Fluoroscopy time 1  minutes. Dose 54.52 mGy.  IMPRESSION: Procedural fluoroscopy during thoracic  spine surgery.   Electronically Signed By: Narda Rutherford M.D. On: 11/25/2023 16:08  Lumbosacral Imaging: Lumbar MR w/wo contrast: Results for orders placed during the hospital encounter of 01/15/24 MR Lumbar Spine W Wo Contrast  Narrative CLINICAL DATA:  48 year old female with back pain. "Back surgery in February, had infection in her spine". MRSA.  Discitis osteomyelitis at T6 through T8 in January and February.  EXAM: MRI LUMBAR SPINE WITHOUT AND WITH CONTRAST  TECHNIQUE: Multiplanar and multiecho pulse sequences of the lumbar spine were obtained without and with intravenous contrast.  CONTRAST:  5mL GADAVIST GADOBUTROL 1 MMOL/ML IV SOLN  COMPARISON:  Thoracic MRI 11/23/2023.  Lumbar MRI 10/23/2023.  FINDINGS: Segmentation: Normal, concordant with the numbering in January in February.  Alignment: Stable since January. Lumbar lordosis with anterolisthesis of L4 on L5 measuring 7-8 mm. Lesser grade 1 chronic spondylolisthesis at L3-L4 measuring 3-4 mm.  Vertebrae: Confluent new marrow edema anteriorly at the L1 and L2 vertebral bodies, more pronounced at the latter. New STIR hyperintensity and enhancement at both of those levels. And intervening L1-L2 disc is now more STIR hyperintense, although not obviously enhancing.  Furthermore, there is evidence of abnormal marrow edema and enhancement in the L2 and L3 spinous processes as seen on series 10, image 7 and series 6, image 7. Enhancing interspinous ligament there also.  But L4-L5 STIR hyperintense endplate marrow edema asymmetric to the right is stable since January and appears to be degenerative in nature.  Background bone marrow signal remains normal. Visible sacrum and SI joints remain normal.  Conus medullaris and cauda equina: Conus extends to the T12-L1 level. No lower spinal cord or conus signal abnormality.  No abnormal intradural enhancement. Cauda equina nerve roots appear stable and normal.  No lumbar dural thickening.  Paraspinal and other soft tissues: Despite the multifocal new marrow edema detailed above there is no convincing paraspinal muscle edema, inflammation, or fluid collection.  And the lumbar epidural space appears to remain normal.  Negative visible abdominal viscera.  Disc levels:  Stable degeneration since January, primarily L3-L4 and L4-L5 in the setting of chronic spondylolisthesis at those levels.  There is mild chronic spinal stenosis at L4-L5, multifactorial moderate to severe bilateral lateral recess and right foraminal stenosis at that level.  IMPRESSION: 1. Positive for L1-L2 Discitis Osteomyelitis, and L2-L3 spinous process Osteomyelitis, which are new since the Lumbar MRI on 10/23/2023. 2. Negative for epidural or paraspinal abscess. 3. 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   Electronically Signed By: Odessa Fleming M.D. On: 01/15/2024 07:42  Lumbar DG 2-3 views: Results for orders placed during the hospital encounter of 12/25/17 DG Lumbar Spine 2-3 Views  Narrative CLINICAL DATA:  Question low back bruise today.  EXAM: LUMBAR SPINE - 2-3 VIEW  COMPARISON:  CT abdomen pelvis Mar 01, 2014  FINDINGS: There is no evidence of lumbar spine fracture. There is grade 1 anterolisthesis of L4 on L5. Facet joint degenerative joint changes are identified in the lower lumbar spine. Minimal anterior osteophytosis is identified at L3, L4 and L5.  IMPRESSION: Degenerative joint changes of the lower lumbar spine. There is grade 1 anterolisthesis of L4 on L5, likely degenerative.   Electronically Signed By: Sherian Rein M.D. On: 12/25/2017 13:48  Lumbar DG (Complete) 4+V: Results for orders placed in visit on 11/21/23 DG Lumbar Spine Complete  Narrative CLINICAL  DATA:  Back pain.  EXAM: LUMBAR SPINE - COMPLETE 4+ VIEW  COMPARISON:  Lumbar  spine MRI 10/23/2023  FINDINGS: Stable degenerative anterolisthesis of L3 and L4. No acute bony findings or destructive bony changes.  IMPRESSION: 1. Stable degenerative anterolisthesis of L3 and L4. 2. No acute bony findings.   Electronically Signed By: Rudie Meyer M.D. On: 11/23/2023 21:31  Foot Imaging: Foot-L DG Complete: Results for orders placed in visit on 11/13/23 DG Foot Complete Left  Narrative Please see detailed radiograph report in office note.  Complexity Note: Imaging results reviewed.                         ROS  Cardiovascular: {Hx; Cardiovascular History:210120525} Pulmonary or Respiratory: {Hx; Pumonary and/or Respiratory History:210120523} Neurological: {Hx; Neurological:210120504} Psychological-Psychiatric: {Hx; Psychological-Psychiatric History:210120512} Gastrointestinal: {Hx; Gastrointestinal:210120527} Genitourinary: {Hx; Genitourinary:210120506} Hematological: {Hx; Hematological:210120510} Endocrine: {Hx; Endocrine history:210120509} Rheumatologic: {Hx; Rheumatological:210120530} Musculoskeletal: {Hx; Musculoskeletal:210120528} Work History: {Hx; Work history:210120514}  Allergies  Ms. Giron is allergic to diclofenac.  Laboratory Chemistry Profile   Renal Lab Results  Component Value Date   BUN 11 01/16/2024   CREATININE 0.50 01/18/2024   GFRAA >60 01/11/2020   GFRNONAA >60 01/18/2024   PROTEINUR 30 (A) 10/23/2023     Electrolytes Lab Results  Component Value Date   NA 137 01/16/2024   K 4.1 01/16/2024   CL 102 01/16/2024   CALCIUM 10.1 01/16/2024   MG 2.1 11/04/2023   PHOS 5.6 (H) 11/04/2023     Hepatic Lab Results  Component Value Date   AST 16 01/15/2024   ALT 9 01/15/2024   ALBUMIN 4.1 01/15/2024   ALKPHOS 121 01/15/2024   LIPASE 21 11/23/2023     ID Lab Results  Component Value Date   HIV Non Reactive 10/18/2023   SARSCOV2NAA NEGATIVE 06/02/2022   STAPHAUREUS NEGATIVE 01/16/2024   MRSAPCR NEGATIVE 01/16/2024    PREGTESTUR NEGATIVE 10/27/2023     Bone No results found for: "VD25OH", "VD125OH2TOT", "ZO1096EA5", "VD2125OH2", "25OHVITD1", "25OHVITD2", "25OHVITD3", "TESTOFREE", "TESTOSTERONE"   Endocrine Lab Results  Component Value Date   GLUCOSE 159 (H) 01/16/2024   GLUCOSEU >=500 (A) 10/23/2023   HGBA1C 6.3 (H) 10/18/2023   TSH 1.979 10/18/2023   FREET4 0.68 01/29/2022     Neuropathy Lab Results  Component Value Date   HGBA1C 6.3 (H) 10/18/2023   HIV Non Reactive 10/18/2023     CNS No results found for: "COLORCSF", "APPEARCSF", "RBCCOUNTCSF", "WBCCSF", "POLYSCSF", "LYMPHSCSF", "EOSCSF", "PROTEINCSF", "GLUCCSF", "JCVIRUS", "CSFOLI", "IGGCSF", "LABACHR", "ACETBL"   Inflammation (CRP: Acute  ESR: Chronic) Lab Results  Component Value Date   CRP 3.7 (H) 01/15/2024   ESRSEDRATE 40 (H) 01/15/2024   LATICACIDVEN 2.1 (HH) 11/23/2023     Rheumatology Lab Results  Component Value Date   LABURIC 2.9 10/25/2023     Coagulation Lab Results  Component Value Date   INR 1.1 11/23/2023   LABPROT 14.2 11/23/2023   APTT 31 11/23/2023   PLT 410 (H) 01/15/2024     Cardiovascular Lab Results  Component Value Date   CKTOTAL 14 (L) 11/03/2023   TROPONINI < 0.02 03/01/2014   HGB 12.8 01/15/2024   HCT 39.2 01/15/2024     Screening Lab Results  Component Value Date   SARSCOV2NAA NEGATIVE 06/02/2022   STAPHAUREUS NEGATIVE 01/16/2024   MRSAPCR NEGATIVE 01/16/2024   HIV Non Reactive 10/18/2023   PREGTESTUR NEGATIVE 10/27/2023     Cancer No results found for: "CEA", "CA125", "LABCA2"   Allergens No results found  for: "ALMOND", "APPLE", "ASPARAGUS", "AVOCADO", "BANANA", "BARLEY", "BASIL", "BAYLEAF", "GREENBEAN", "LIMABEAN", "WHITEBEAN", "BEEFIGE", "REDBEET", "BLUEBERRY", "BROCCOLI", "CABBAGE", "MELON", "CARROT", "CASEIN", "CASHEWNUT", "CAULIFLOWER", "CELERY"     Note: Lab results reviewed.  PFSH  Drug: Ms. Mirkin  reports no history of drug use. Alcohol:  reports that she does not  currently use alcohol. Tobacco:  reports that she has quit smoking. Her smoking use included cigarettes. She has a 3 pack-year smoking history. She has never used smokeless tobacco. Medical:  has a past medical history of Anxiety, Arthritis, Asthma, HTN (hypertension), Neuromuscular disorder (HCC), and Restless leg syndrome. Family: family history is not on file.  Past Surgical History:  Procedure Laterality Date   APPLICATION OF INTRAOPERATIVE CT SCAN N/A 11/25/2023   Procedure: APPLICATION OF INTRAOPERATIVE CT SCAN;  Surgeon: Lovenia Kim, MD;  Location: [facility];  Service: Neurosurgery;  Laterality: N/A;   BIOPSY  06/28/2019   Procedure: BIOPSY;  Surgeon: Pasty Spillers, MD;  Location: [facility];  Service: Endoscopy;;   CESAREAN SECTION     x2   COLONOSCOPY WITH PROPOFOL N/A 06/28/2019   Procedure: COLONOSCOPY WITH PROPOFOL;  Surgeon: Pasty Spillers, MD;  Location: [facility];  Service: Endoscopy;  Laterality: N/A;   ESOPHAGOGASTRODUODENOSCOPY (EGD) WITH PROPOFOL N/A 06/28/2019   Procedure: ESOPHAGOGASTRODUODENOSCOPY (EGD) WITH PROPOFOL;  Surgeon: Pasty Spillers, MD;  Location: [facility];  Service: Endoscopy;  Laterality: N/A;   INCISION AND DRAINAGE Left 10/27/2023   Procedure: INCISION AND DRAINAGE LEFT FOOT;  Surgeon: Pilar Plate, DPM;  Location: [facility];  Service: Orthopedics/Podiatry;  Laterality: Left;   INCISION AND DRAINAGE Left 10/30/2023   Procedure: LEFT FOOT FIRST METATARSAL PHALANGEAL JOINT RESECTION, ANTIBIOTIC SPACER, WASHOUT, WOUND CLOSURE;  Surgeon: Pilar Plate, DPM;  Location: [facility];  Service: Orthopedics/Podiatry;  Laterality: Left;   TEE WITHOUT CARDIOVERSION N/A 10/29/2023   Procedure: TRANSESOPHAGEAL ECHOCARDIOGRAM (TEE);  Surgeon: Antonieta Iba, MD;  Location: [facility];  Service: Cardiovascular;  Laterality: N/A;   THORACIC LAMINECTOMY FOR EPIDURAL ABSCESS N/A 11/25/2023   Procedure: T6-T7  thoracic laminectomy, right sided transpedicular decompression and disc debridement;  Surgeon: Lovenia Kim, MD;  Location: [facility];  Service: Neurosurgery;  Laterality: N/A;   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   Procedure: LEFT FOOT 1ST MPJ RESECTION;  Surgeon: Pilar Plate, DPM;  Location: [facility];  Service: Orthopedics/Podiatry;  Laterality: Left;   TRANSMETATARSAL AMPUTATION Left 10/30/2023   Procedure: LEFT FOOT 1ST MPJ RESECTION;  Surgeon: Pilar Plate, DPM;  Location: [facility];  Service: Orthopedics/Podiatry;  Laterality: Left;   Active Ambulatory Problems    Diagnosis Date Noted   Stomach ache    Diarrhea    Stomach irritation    Acute pancreatitis 02/27/2021   Asthma 02/27/2021   Abdominal pain 01/29/2022   Small bowel intussusception (HCC) 10/19/2023   Pancreatitis 10/19/2023   Septic arthritis of left foot (HCC) 10/26/2023   Acute hematogenous osteomyelitis of left foot (HCC) 10/27/2023   Abscess of left foot 10/27/2023   MRSA bacteremia 10/27/2023   Osteomyelitis of thoracic spine (HCC) 10/28/2023   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   Acute osteomyelitis of left foot (HCC) 10/30/2023   Acute bilateral back pain 11/21/2023   HTN (hypertension)    Hyperkalemia 11/23/2023   Epidural abscess 11/23/2023   Acute osteomyelitis of thoracic spine (HCC) 11/24/2023   Pathologic fracture of thoracic vertebrae, initial encounter 11/25/2023   MRSA infection 11/27/2023   Type 2 diabetes mellitus without complication, without long-term current  use of insulin (HCC) 12/17/2023   Acute osteomyelitis of lumbar spine (HCC) 01/15/2024   Osteomyelitis (HCC) 01/15/2024   Discitis of lumbar region 01/16/2024   Osteomyelitis of lumbar spine (HCC) 01/16/2024   Abnormal MRI, thoracic spine (11/23/2023) 01/18/2024   Abnormal MRI, lumbar spine (01/15/2024) 01/18/2024   Pharmacologic therapy 01/18/2024   Disorder of skeletal system 01/18/2024   Problems  influencing health status 01/18/2024   Chronic pain syndrome 01/18/2024   Resolved Ambulatory Problems    Diagnosis Date Noted   No Resolved Ambulatory Problems   Past Medical History:  Diagnosis Date   Anxiety    Arthritis    Neuromuscular disorder (HCC)    Restless leg syndrome    Constitutional Exam  General appearance: Well nourished, well developed, and well hydrated. In no apparent acute distress There were no vitals filed for this visit. BMI Assessment: Estimated body mass index is 21.48 kg/m as calculated from the following:   Height as of 01/15/24: 5' (1.524 m).   Weight as of 01/15/24: 110 lb (49.9 kg).  BMI interpretation table: BMI level Category Range association with higher incidence of chronic pain  <18 kg/m2 Underweight   18.5-24.9 kg/m2 Ideal body weight   25-29.9 kg/m2 Overweight Increased incidence by 20%  30-34.9 kg/m2 Obese (Class I) Increased incidence by 68%  35-39.9 kg/m2 Severe obesity (Class II) Increased incidence by 136%  >40 kg/m2 Extreme obesity (Class III) Increased incidence by 254%   Patient's current BMI Ideal Body weight  There is no height or weight on file to calculate BMI. Ideal body weight: 45.5 kg (100 lb 4.9 oz) Adjusted ideal body weight: 47.3 kg (104 lb 3 oz)   BMI Readings from Last 4 Encounters:  01/15/24 21.48 kg/m  12/30/23 22.07 kg/m  12/17/23 21.68 kg/m  12/09/23 21.48 kg/m   Wt Readings from Last 4 Encounters:  01/15/24 110 lb (49.9 kg)  12/30/23 113 lb (51.3 kg)  12/17/23 111 lb (50.3 kg)  12/09/23 110 lb (49.9 kg)   Psych/Mental status: Alert, oriented x 3 (person, place, & time)       Eyes: PERLA Respiratory: No evidence of acute respiratory distress  Assessment  Primary Diagnosis & Pertinent Problem List: The primary encounter diagnosis was Chronic pain syndrome. Diagnoses of Pharmacologic therapy, Disorder of skeletal system, Problems influencing health status, Abnormal MRI, thoracic spine (11/23/2023), and  Abnormal MRI, lumbar spine (01/15/2024) were also pertinent to this visit.  Visit Diagnosis (New problems to examiner): 1. Chronic pain syndrome   2. Pharmacologic therapy   3. Disorder of skeletal system   4. Problems influencing health status   5. Abnormal MRI, thoracic spine (11/23/2023)   6. Abnormal MRI, lumbar spine (01/15/2024)    Plan of Care (Initial workup plan)  Note: Ms. Pierron was reminded that as per protocol, today's visit has been an evaluation only. We have not taken over the patient's controlled substance management.  Problem-specific plan: Assessment and Plan           Lab Orders  No laboratory test(s) ordered today   Imaging Orders  No imaging studies ordered today   Referral Orders  No referral(s) requested today   Procedure Orders    No procedure(s) ordered today   Pharmacotherapy (current): Medications ordered:  No orders of the defined types were placed in this encounter.  Medications administered during this visit: Tola L. Teems had no medications administered during this visit.   Analgesic Pharmacotherapy:  Opioid Analgesics: For patients currently taking or requesting  to take opioid analgesics, in accordance with [facility] Guidelines, we will assess their risks and indications for the use of these substances. After completing our evaluation, we may offer recommendations, but we no longer take patients for medication management. The prescribing physician will ultimately decide, based on his/her training and level of comfort whether to adopt any of the recommendations, including whether or not to prescribe such medicines.  Membrane stabilizer: To be determined at a later time  Muscle relaxant: To be determined at a later time  NSAID: To be determined at a later time  Other analgesic(s): To be determined at a later time   Interventional management options: Ms. Schlotzhauer was informed that there is no guarantee that she would be a  candidate for interventional therapies. The decision will be based on the results of diagnostic studies, as well as Ms. Voorhees's risk profile.  Procedure(s) under consideration:  Pending results of ordered studies     Interventional Therapies  Risk Factors  Considerations  Medical Comorbidities:     Planned  Pending:      Under consideration:   Pending   Completed:   None at this time   Therapeutic  Palliative (PRN) options:   None established   Completed by other providers:   None reported    Provider-requested follow-up: No follow-ups on file.  Future Appointments  Date Time Provider Department Center  01/19/2024  8:00 AM Delano Metz, MD [facility] None  01/21/2024 10:45 AM Lovenia Kim, MD [facility] None  02/11/2024  2:00 PM Joan Flores, PA-C [facility] None  02/24/2024 10:15 AM Lynn Ito, MD [facility] None  04/23/2024  1:00 PM Twana First, Joice Lofts, NP [facility] None  05/11/2024  3:15 PM Candelaria Stagers, DPM [id number]   I discussed the assessment and treatment plan with the patient. The patient was provided an opportunity to ask questions and all were answered. The patient agreed with the plan and demonstrated an understanding of the instructions.  Patient advised to call back or seek an in-person evaluation if the symptoms or condition worsens.  Duration of encounter: *** minutes.  Total time on encounter, as per AMA guidelines included both the face-to-face and non-face-to-face time personally spent by the physician and/or other qualified health care professional(s) on the day of the encounter (includes time in activities that require the physician or other qualified health care professional and does not include time in activities normally performed by clinical staff). Physician's time may include the following activities when performed: Preparing to see the patient (e.g., pre-charting review of records, searching for previously ordered imaging, lab  work, and nerve conduction tests) Review of prior analgesic pharmacotherapies. Reviewing PMP Interpreting ordered tests (e.g., lab work, imaging, nerve conduction tests) Performing post-procedure evaluations, including interpretation of diagnostic procedures Obtaining and/or reviewing separately obtained history Performing a medically appropriate examination and/or evaluation Counseling and educating the patient/family/caregiver Ordering medications, tests, or procedures Referring and communicating with other health care professionals (when not separately reported) Documenting clinical information in the electronic or other health record Independently interpreting results (not separately reported) and communicating results to the patient/ family/caregiver Care coordination (not separately reported)  Note by: Oswaldo Done, MD (TTS and AI technology used. I apologize for any typographical errors that were not detected and corrected.) Date: 01/19/2024; Time: 2:38 PM

## 2024-01-18 NOTE — Plan of Care (Signed)
  Problem: Fluid Volume: Goal: Ability to maintain a balanced intake and output will improve Outcome: Progressing   Problem: Clinical Measurements: Goal: Ability to maintain clinical measurements within normal limits will improve Outcome: Progressing   Problem: Clinical Measurements: Goal: Will remain free from infection Outcome: Progressing   Problem: Elimination: Goal: Will not experience complications related to bowel motility Outcome: Progressing

## 2024-01-18 NOTE — Progress Notes (Signed)
 PROGRESS NOTE    Danielle Harrison  ZOX:[id number] DOB: [birth year] DOA: 01/15/2024 PCP: [name], NP    Brief Narrative:  48 y.o. 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  Patient reported that she has been compliant with IV antibiotics 3 times a day and she also reported that about 9 to 10 days ago vancomycin dosage was increased from 1500 to 2500 mg every 8 hours.  3 days ago, she started develop new onset of lower back pain, she reported the location is lower than the thoracic spine pain she had 2 months ago, gradually getting worse but she denied any weakness of her legs and no trouble urinating or making bowel movements.  Denied any fever or chills.   ID following. IR/neurosurgery was consulted for possible aspiration but was discontinued due to not having enough fluid collection for intervention.    Assessment & Plan:   Principal Problem:   Osteomyelitis (HCC) Active Problems:   Acute osteomyelitis of lumbar spine (HCC)   Discitis of lumbar region   Osteomyelitis of lumbar spine (HCC)  Acute lumbar osteomyelitis/discitis -while maintaining good antibiotic adherence outpatient - ID managing  Plan: -Continue IV vancomycin and rifampin per ID recommendations -Disc aspiration attempt per IR 4/7 -Multimodal pain  regimen, minimize IV narcotic -N.p.o. after midnight -Case discussed with infectious disease   Asthma, unknown severity -No symptoms signs of flareup, continue as needed bronchodilators   Hypertension -Continue metoprolol   Type 2 diabetes mellitus with hyperglycemia -Most recent A1c 6.4 in January -SSI while holding off Metformin   DVT prophylaxis: Lovenox Code Status: Full Family Communication:None Disposition Plan: Status is: Inpatient Remains inpatient appropriate because: Acute issues as above   Level of care: Med-Surg  Consultants:  ID Neurosurgery  Procedures:  None  Antimicrobials: Vancomycin Rifampin   Subjective: Seen and examined.  Continues to endorse pain.  No other acute events.  Objective: Vitals:   01/18/24 0020 01/18/24 0403 01/18/24 0742 01/18/24 1145  BP: 103/75 111/85 104/63 113/70  Pulse: 72 75 75 75  Resp: 18 20 17 18   Temp: 97.7 F (36.5 C) 98.2 F (36.8 C) 98 F (36.7 C) 97.8 F (36.6 C)  TempSrc: Oral  Oral Oral  SpO2: 100% 100% 100%   Weight:      Height:        Intake/Output Summary (Last 24 hours) at 01/18/2024 1401 Last data filed at 01/18/2024 0419 Gross per 24 hour  Intake 470.7 ml  Output --  Net 470.7 ml   Filed Weights   01/15/24 0542  Weight: 49.9 kg    Examination:  General exam: NAD.  Appears fatigued Respiratory system: Lung clear.  Normal work of breathing.  Room air Cardiovascular system: S1-S2, RRR, no murmurs, no pedal edema Gastrointestinal system: Soft, NT/ND, normal bowel sounds Central nervous system: Alert and oriented. No focal neurological deficits. Extremities: Decreased power bilateral lower extremities.  Gait not assessed Skin: No rashes, lesions or ulcers Psychiatry: Judgement and insight appear normal. Mood & affect appropriate.  Data Reviewed: I have personally reviewed following labs and imaging studies  CBC: Recent Labs  Lab 01/15/24 0637  WBC 9.0  HGB 12.8  HCT 39.2  MCV  92.0  PLT 410*   Basic Metabolic Panel: Recent Labs  Lab 01/15/24 0637 01/16/24 0330 01/17/24 0602 01/18/24 0458  NA 135 137  --   --   K 4.1 4.1  --   --   CL 101 102  --   --   CO2 24 23  --   --   GLUCOSE 220* 159*  --   --   BUN 10 11  --   --   CREATININE 0.87 0.59 0.72 0.50  CALCIUM 9.9 10.1  --   --    GFR: Estimated Creatinine Clearance: 62.4 mL/min (by C-G formula based on SCr of 0.5 mg/dL). Liver Function Tests: Recent Labs  Lab 01/15/24 0637  AST 16  ALT 9  ALKPHOS 121  BILITOT 0.5  PROT 7.6  ALBUMIN 4.1   No results for input(s): "LIPASE", "AMYLASE" in the last 168 hours. No results for input(s): "AMMONIA" in the last 168 hours. Coagulation Profile: No results for input(s): "INR", "PROTIME" in the last 168 hours. Cardiac Enzymes: No results for input(s): "CKTOTAL", "CKMB", "CKMBINDEX", "TROPONINI" in the last 168 hours. BNP (last 3 results) No results for input(s): "PROBNP" in the last 8760 hours. HbA1C: No results for input(s): "HGBA1C" in the last 72 hours. CBG: Recent Labs  Lab 01/17/24 1224 01/17/24 1834 01/17/24 2210 01/18/24 0744 01/18/24 1224  GLUCAP 174* 234* 134* 195* 190*   Lipid Profile: No results for input(s): "CHOL", "HDL", "LDLCALC", "TRIG", "CHOLHDL", "LDLDIRECT" in the last 72 hours. Thyroid Function Tests: No results for input(s): "TSH", "T4TOTAL", "FREET4", "T3FREE", "THYROIDAB" in the last 72 hours. Anemia Panel: No results for input(s): "VITAMINB12", "FOLATE", "FERRITIN", "TIBC", "IRON", "RETICCTPCT" in the last 72 hours. Sepsis Labs: No results for input(s): "PROCALCITON", "LATICACIDVEN" in the last 168 hours.  Recent Results (from the past 240 hours)  Blood culture (routine x 2)     Status: None (Preliminary result)   Collection Time: 01/15/24 10:10 AM   Specimen: BLOOD  Result Value Ref Range Status   Specimen Description BLOOD PICC LINE  Final   Special Requests   Final    BOTTLES DRAWN AEROBIC AND ANAEROBIC  Blood Culture results may not be optimal due to an inadequate volume of blood received in culture bottles   Culture   Final    NO GROWTH 3 DAYS Performed at [facility], [address] [place], [place] [address]    Report Status PENDING  Incomplete  Blood culture (routine x 2)     Status: None (Preliminary result)   Collection Time: 01/15/24 10:10 AM   Specimen: BLOOD  Result Value Ref Range Status   Specimen Description BLOOD LEFT ANTECUBITAL  Final   Special Requests   Final    BOTTLES DRAWN AEROBIC AND ANAEROBIC Blood Culture results may not be optimal due to an inadequate volume of blood received in culture bottles   Culture   Final    NO GROWTH 3 DAYS Performed at [facility], [address], [place], [place] [address]    Report Status PENDING  Incomplete  Surgical pcr screen     Status: None   Collection Time: 01/16/24 12:03 PM   Specimen: Nasal Mucosa; Nasal Swab  Result Value Ref Range Status   MRSA, PCR NEGATIVE NEGATIVE Final   Staphylococcus aureus NEGATIVE NEGATIVE  Final    Comment: (NOTE) The Xpert SA Assay (FDA approved for NASAL specimens in patients 56 years of age and older), is one component of a comprehensive surveillance program. It is not intended to diagnose infection nor to guide or monitor treatment. Performed at [facility], [address], [place], [place] [address]          Radiology Studies: No results found.      Scheduled Meds:  Chlorhexidine Gluconate Cloth  6 each Topical Daily   insulin aspart  0-9 Units Subcutaneous TID WC   metoprolol tartrate  100 mg Oral BID   mometasone-formoterol  2 puff Inhalation BID   polyethylene glycol  17 g Oral Daily   rifampin  300 mg Oral Q12H   sodium chloride flush  10-40 mL Intracatheter Q12H   Continuous Infusions:  sodium chloride Stopped (01/18/24 0410)   vancomycin 150 mL/hr at 01/18/24 0419     LOS: 3 days      Tresa Moore, MD [facility]   If 7PM-7AM, please contact night-coverage  01/18/2024, 2:01 PM

## 2024-01-19 ENCOUNTER — Inpatient Hospital Stay: Admitting: Radiology

## 2024-01-19 ENCOUNTER — Ambulatory Visit (HOSPITAL_BASED_OUTPATIENT_CLINIC_OR_DEPARTMENT_OTHER): Admitting: Pain Medicine

## 2024-01-19 ENCOUNTER — Encounter: Payer: Self-pay | Admitting: Internal Medicine

## 2024-01-19 DIAGNOSIS — M899 Disorder of bone, unspecified: Secondary | ICD-10-CM

## 2024-01-19 DIAGNOSIS — M4646 Discitis, unspecified, lumbar region: Secondary | ICD-10-CM | POA: Diagnosis not present

## 2024-01-19 DIAGNOSIS — Z91199 Patient's noncompliance with other medical treatment and regimen due to unspecified reason: Secondary | ICD-10-CM

## 2024-01-19 DIAGNOSIS — G8929 Other chronic pain: Secondary | ICD-10-CM

## 2024-01-19 DIAGNOSIS — M8618 Other acute osteomyelitis, other site: Secondary | ICD-10-CM | POA: Diagnosis not present

## 2024-01-19 DIAGNOSIS — G894 Chronic pain syndrome: Secondary | ICD-10-CM

## 2024-01-19 DIAGNOSIS — M4626 Osteomyelitis of vertebra, lumbar region: Secondary | ICD-10-CM | POA: Diagnosis not present

## 2024-01-19 DIAGNOSIS — Z789 Other specified health status: Secondary | ICD-10-CM

## 2024-01-19 DIAGNOSIS — Z79899 Other long term (current) drug therapy: Secondary | ICD-10-CM

## 2024-01-19 DIAGNOSIS — R937 Abnormal findings on diagnostic imaging of other parts of musculoskeletal system: Secondary | ICD-10-CM

## 2024-01-19 HISTORY — PX: IR LUMBAR DISC ASPIRATION W/IMG GUIDE: IMG5306

## 2024-01-19 LAB — VANCOMYCIN, TROUGH: Vancomycin Tr: 17 ug/mL (ref 15–20)

## 2024-01-19 LAB — GLUCOSE, CAPILLARY
Glucose-Capillary: 151 mg/dL — ABNORMAL HIGH (ref 70–99)
Glucose-Capillary: 179 mg/dL — ABNORMAL HIGH (ref 70–99)
Glucose-Capillary: 229 mg/dL — ABNORMAL HIGH (ref 70–99)
Glucose-Capillary: 215 mg/dL — ABNORMAL HIGH (ref 70–99)
Glucose-Capillary: 161 mg/dL — ABNORMAL HIGH (ref 70–99)

## 2024-01-19 MED ORDER — MIDAZOLAM HCL 5 MG/5ML IJ SOLN
INTRAMUSCULAR | Status: AC | PRN
  Administered 2024-01-19: 1 mg via INTRAVENOUS

## 2024-01-19 MED ORDER — MIDAZOLAM HCL 2 MG/2ML IJ SOLN
INTRAMUSCULAR | Status: AC
  Filled 2024-01-19: qty 2

## 2024-01-19 MED ORDER — SODIUM CHLORIDE 0.9 % IV SOLN: INTRAVENOUS | Status: AC | PRN

## 2024-01-19 MED ORDER — FENTANYL CITRATE (PF) 100 MCG/2ML IJ SOLN
INTRAMUSCULAR | Status: AC | PRN
  Administered 2024-01-19 (×2): 50 ug via INTRAVENOUS

## 2024-01-19 MED ORDER — VANCOMYCIN HCL 500 MG/100ML IV SOLN
INTRAVENOUS | Status: AC | PRN
  Administered 2024-01-19: 750 mg via INTRAVENOUS

## 2024-01-19 MED ORDER — FENTANYL CITRATE (PF) 100 MCG/2ML IJ SOLN
INTRAMUSCULAR | Status: AC
  Filled 2024-01-19: qty 2

## 2024-01-19 MED ORDER — MIDAZOLAM HCL 2 MG/2ML IJ SOLN
INTRAMUSCULAR | Status: AC | PRN
Start: 2024-01-19 — End: 2024-01-19
  Administered 2024-01-19: 1 mg via INTRAVENOUS

## 2024-01-19 NOTE — Consult Note (Addendum)
 Pharmacy Antibiotic Note  Danielle Harrison is a 48 y.o. female admitted on 01/15/2024 with bacteremia.  Pharmacy has been consulted for vancomycin dosing for continuation of therapy for vertebral osteomyelitis and epidural abscess.  Patient presents 4/3 with increasing back pain and MRI showed new L1-2 marrow edema with concern for osteomyelitis  - Pt received vancomycin per RN 4/3 at 0500. Vancomycin random this morning was 32.    HPI - Per ID note: Was in the hospital between 10/18/2023 until 11/04/2023 for acute pancreatitis but also developed MRSA bacteremia 5 days into hospital stay ( BC positive on 1/9, 1/11 and 1/12) and T8-T9 thoracic vertebral osteomyelitis/discitis and left great toe septic arthritis for which she underwent Left foot first MTP joint resection including head of the first metatarsal and base of proximal phalanx. This was done on 10/30/2023. Culture was positive for MRSA. She came back to [facility] ED on 11/23/2023 complaining of worsening pain and it was hurting for her to talk turn twist or move.She ahs no fever or chills. She has been 100% adherent to Daptomycin. MRI showed epidural abscess and nerve compression after 23 days of IV daptomycin. She underwent surgery on 11/25/23. She was this time treated with vancomycin and discharged home on it . There has been some fluctuation in vanco levels- intially low and vanco was increased to Q8. SCr has been steady.   Today, 01/19/2024 On vancomycin and rifampin prior to admission - to complete 6 weeks of therapy on 01/19/2024 - likely extend Renal: Scr 0.53 (this looks to be closer to baseline, was 0.87 at admit) WBC WNL Afebrile 4/3 blood cx: NGTD  Vancomycin levels:  Per home infusion, patient to be receiving vancomycin 1250mg  IV q8h.  Last dose prior to presentation to ED was 4/3 at 0500 4/3 vancomycin random level at 1010= 32 mcg/ml 4/3 vancomycin random level at 1718 = 20 mcg/ml  This calculates to a half-life of 10.3h (ke 0.067) 4/3  vancomycin 1250mg  IV x 1 given at 2042 4/4 vancomycin level at 0656 = 30 mcg/ml 4/4 patient started on vancomycin 750mg  IV q12h 4/4 at 1600 Vancomycin trough 4/5 at 1526 = 17 mcg/ml Vancomycin trough 4/6 at 1540 = 18 mcg/mL Vancomycin trough 4/7 at 1546 = 17 mcg/mL  Plan: Based on vancomycin troughs the last 3 days, continue vancomycin 750mg  IV q12h  (goal trough ~18-20 per ID)  Recheck levels as needed  Follow renal function closely Await plan for antibiotics per ID - was to complete therapy 4/7 but new MRI findings may change that plan  Height: 5' (152.4 cm) Weight: 49.9 kg (110 lb) IBW/kg (Calculated) : 45.5  Temp (24hrs), Avg:98.2 F (36.8 C), Min:97.8 F (36.6 C), Max:98.7 F (37.1 C)  Recent Labs  Lab 01/15/24 0637 01/15/24 1010 01/15/24 1718 01/16/24 0330 01/16/24 0656 01/17/24 0602 01/17/24 1526 01/18/24 0458 01/18/24 1540  WBC 9.0  --   --   --   --   --   --   --   --   CREATININE 0.87  --   --  0.59  --  0.72  --  0.50 0.53  VANCOTROUGH  --   --   --   --   --   --  17  --  18  VANCORANDOM  --    < > 20  --  30  --   --   --   --    < > = values in this interval not displayed.  Estimated Creatinine Clearance: 62.4 mL/min (by C-G formula based on SCr of 0.53 mg/dL).    Allergies  Allergen Reactions   Diclofenac Hives    Thank you for allowing pharmacy to be a part of this patient's care.  Juliette Alcide, PharmD, BCPS, BCIDP Work Cell: [phone] 01/19/2024 10:51 AM

## 2024-01-19 NOTE — Procedures (Signed)
 Interventional Radiology Procedure Note  Procedure: Aspiration of the L1-L2 disc and L2-L3 spinous processes yielding a tiny amount of bloody fluid  Complications: None  Estimated Blood Loss: None  Recommendations: - Cultures sent   Signed,  Sterling Big, MD

## 2024-01-19 NOTE — Progress Notes (Signed)
 PROGRESS NOTE    Danielle Harrison  WUJ:[id number] DOB: May 17, 1976 DOA: 01/15/2024 PCP: [name], NP    Brief Narrative:  48 y.o. 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  Patient reported that she has been compliant with IV antibiotics 3 times a day and she also reported that about 9 to 10 days ago vancomycin dosage was increased from 1500 to 2500 mg every 8 hours.  3 days ago, she started develop new onset of lower back pain, she reported the location is lower than the thoracic spine pain she had 2 months ago, gradually getting worse but she denied any weakness of her legs and no trouble urinating or making bowel movements.  Denied any fever or chills.   ID following. IR/neurosurgery was consulted for possible aspiration but was discontinued due to not having enough fluid collection for intervention.    Assessment & Plan:   Principal Problem:   Osteomyelitis (HCC) Active Problems:   Acute osteomyelitis of lumbar spine (HCC)   Discitis of lumbar region   Osteomyelitis of lumbar spine (HCC)  Acute lumbar osteomyelitis/discitis -while maintaining good antibiotic adherence outpatient - ID managing  Plan: -Continue IV vancomycin and rifampin per ID recommendations -Disc aspiration attempt per IR 4/7, discussed with IR and  ID -Multimodal pain regimen, minimize IV narcotic   Asthma, unknown severity -No symptoms signs of flareup, continue as needed bronchodilators   Hypertension -Continue metoprolol   Type 2 diabetes mellitus with hyperglycemia -Most recent A1c 6.4 in January -Low-dose Semglee and SSI for now   DVT prophylaxis: Lovenox Code Status: Full Family Communication:None Disposition Plan: Status is: Inpatient Remains inpatient appropriate because: Acute issues as above   Level of care: Med-Surg  Consultants:  ID Neurosurgery  Procedures:  None  Antimicrobials: Vancomycin Rifampin   Subjective: Seen and examined.  Appears comfortable this morning.  N.p.o. for procedure.  Objective: Vitals:   01/19/24 0542 01/19/24 0809 01/19/24 0932 01/19/24 1121  BP: (!) 94/59 110/76 116/79 112/83  Pulse: 79 83 83 75  Resp: 18 18  18   Temp: 98.1 F (36.7 C) 98.7 F (37.1 C)  98.4 F (36.9 C)  TempSrc:  Oral  Oral  SpO2: 100% 100% 100%   Weight:      Height:        Intake/Output Summary (Last 24 hours) at 01/19/2024 1239 Last data filed at 01/18/2024 1922 Gross per 24 hour  Intake 240 ml  Output --  Net 240 ml   Filed Weights   01/15/24 0542  Weight: 49.9 kg    Examination:  General exam: No acute distress Respiratory system: Lung clear.  Normal work of breathing.  Room air Cardiovascular system: S1-S2, RRR, no murmurs, no pedal edema Gastrointestinal system: Soft, NT/ND, normal bowel sounds Central nervous system: Alert and oriented. No focal neurological deficits. Extremities: Decreased power bilateral lower extremities.  Gait not assessed Skin: No rashes, lesions or ulcers Psychiatry: Judgement and insight appear normal. Mood & affect appropriate.     Data Reviewed:  I have personally reviewed following labs and imaging studies  CBC: Recent Labs  Lab 01/15/24 0637  WBC 9.0  HGB 12.8  HCT 39.2  MCV 92.0  PLT 410*   Basic Metabolic Panel: Recent Labs  Lab  01/15/24 0637 01/16/24 0330 01/17/24 0602 01/18/24 0458 01/18/24 1540  NA 135 137  --   --   --   K 4.1 4.1  --   --   --   CL 101 102  --   --   --   CO2 24 23  --   --   --   GLUCOSE 220* 159*  --   --   --   BUN 10 11  --   --   --   CREATININE 0.87 0.59 0.72 0.50 0.53  CALCIUM 9.9 10.1  --   --   --    GFR: Estimated Creatinine Clearance: 62.4 mL/min (by C-G formula based on SCr of 0.53 mg/dL). Liver Function Tests: Recent Labs  Lab 01/15/24 0637  AST 16  ALT 9  ALKPHOS 121  BILITOT 0.5  PROT 7.6  ALBUMIN 4.1   No results for input(s): "LIPASE", "AMYLASE" in the last 168 hours. No results for input(s): "AMMONIA" in the last 168 hours. Coagulation Profile: No results for input(s): "INR", "PROTIME" in the last 168 hours. Cardiac Enzymes: No results for input(s): "CKTOTAL", "CKMB", "CKMBINDEX", "TROPONINI" in the last 168 hours. BNP (last 3 results) No results for input(s): "PROBNP" in the last 8760 hours. HbA1C: No results for input(s): "HGBA1C" in the last 72 hours. CBG: Recent Labs  Lab 01/18/24 1224 01/18/24 1649 01/18/24 2202 01/19/24 0812 01/19/24 1152  GLUCAP 190* 227* 239* 151* 179*   Lipid Profile: No results for input(s): "CHOL", "HDL", "LDLCALC", "TRIG", "CHOLHDL", "LDLDIRECT" in the last 72 hours. Thyroid Function Tests: No results for input(s): "TSH", "T4TOTAL", "FREET4", "T3FREE", "THYROIDAB" in the last 72 hours. Anemia Panel: No results for input(s): "VITAMINB12", "FOLATE", "FERRITIN", "TIBC", "IRON", "RETICCTPCT" in the last 72 hours. Sepsis Labs: No results for input(s): "PROCALCITON", "LATICACIDVEN" in the last 168 hours.  Recent Results (from the past 240 hours)  Blood culture (routine x 2)     Status: None (Preliminary result)   Collection Time: 01/15/24 10:10 AM   Specimen: BLOOD  Result Value Ref Range Status   Specimen Description BLOOD PICC LINE  Final   Special Requests   Final    BOTTLES DRAWN AEROBIC AND ANAEROBIC Blood  Culture results may not be optimal due to an inadequate volume of blood received in culture bottles   Culture   Final    NO GROWTH 4 DAYS Performed at [facility], [address], [place], [place] [address]    Report Status PENDING  Incomplete  Blood culture (routine x 2)     Status: None (Preliminary result)   Collection Time: 01/15/24 10:10 AM   Specimen: BLOOD  Result Value Ref Range Status   Specimen Description BLOOD LEFT ANTECUBITAL  Final   Special Requests   Final    BOTTLES DRAWN AEROBIC AND ANAEROBIC Blood Culture results may not be optimal due to an inadequate volume of blood received in culture bottles   Culture   Final    NO GROWTH 4 DAYS Performed at [facility], [address], [place], [place] [address]    Report Status PENDING  Incomplete  Surgical pcr screen     Status: None   Collection Time: 01/16/24 12:03 PM   Specimen: Nasal  Mucosa; Nasal Swab  Result Value Ref Range Status   MRSA, PCR NEGATIVE NEGATIVE Final   Staphylococcus aureus NEGATIVE NEGATIVE Final    Comment: (NOTE) The Xpert SA Assay (FDA approved for NASAL specimens in patients 8 years of age and older), is one component of a comprehensive surveillance program. It is not intended to diagnose infection nor to guide or monitor treatment. Performed at [facility], [address], [place], [place] [address]          Radiology Studies: No results found.      Scheduled Meds:  Chlorhexidine Gluconate Cloth  6 each Topical Daily   insulin aspart  0-15 Units Subcutaneous TID WC   insulin aspart  0-5 Units Subcutaneous QHS   insulin glargine-yfgn  5 Units Subcutaneous QHS   metoprolol tartrate  100 mg Oral BID   mometasone-formoterol  2 puff Inhalation BID   polyethylene glycol  17 g Oral Daily   rifampin  300 mg Oral Q12H   sodium chloride flush  10-40 mL Intracatheter Q12H   Continuous Infusions:  sodium chloride 10 mL/hr at 01/19/24 0428   vancomycin  750 mg (01/19/24 0430)     LOS: 4 days      Tresa Moore, MD [facility]   If 7PM-7AM, please contact night-coverage  01/19/2024, 12:39 PM

## 2024-01-19 NOTE — Consult Note (Signed)
 Chief Complaint: Patient was seen in consultation today for discitis  Referring Physician(s): Dr. Lynn Ito  Supervising Physician: Malachy Moan  Patient Status: [facility] - Out-pt  History of Present Illness: Danielle Harrison is a 48 y.o. 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  IR consulted for L2/L3 discitis.  Case was reviewed for possible procedure how it was noted there was no fluid component to this area of inflammation.  Second request placed by the medical team for aspiration and culture for source of her infection given recent IV abx course. Case reviewed by Dr. Archer Asa who again notes little to no fluid at the L1/L2 disc as well as the L2/L3 spinous process, however approves attempt at aspiration for speciation.   Past Medical History:  Diagnosis Date   Anxiety    Arthritis    back   Asthma    HTN (hypertension)    Neuromuscular disorder (HCC)    weakness and numbness    Restless leg syndrome     Past Surgical History:  Procedure Laterality Date   APPLICATION OF INTRAOPERATIVE CT SCAN N/A 11/25/2023   Procedure: APPLICATION OF INTRAOPERATIVE CT SCAN;  Surgeon: Lovenia Kim, MD;  Location: [facility];  Service: Neurosurgery;  Laterality: N/A;   BIOPSY  06/28/2019   Procedure: BIOPSY;  Surgeon: Pasty Spillers, MD;  Location[facility];  Service: Endoscopy;;   CESAREAN SECTION     x2   COLONOSCOPY WITH PROPOFOL N/A 06/28/2019   Procedure: COLONOSCOPY WITH PROPOFOL;  Surgeon: Pasty Spillers, MD;  Location: [facility];  Service: Endoscopy;  Laterality: N/A;   ESOPHAGOGASTRODUODENOSCOPY (EGD) WITH PROPOFOL N/A 06/28/2019   Procedure:  ESOPHAGOGASTRODUODENOSCOPY (EGD) WITH PROPOFOL;  Surgeon: Pasty Spillers, MD;  Location: [facility];  Service: Endoscopy;  Laterality: N/A;   INCISION AND DRAINAGE Left 10/27/2023   Procedure: INCISION AND DRAINAGE LEFT FOOT;  Surgeon: Pilar Plate, DPM;  Location: [facility];  Service: Orthopedics/Podiatry;  Laterality: Left;   INCISION AND DRAINAGE Left 10/30/2023   Procedure: LEFT FOOT FIRST METATARSAL PHALANGEAL JOINT RESECTION, ANTIBIOTIC SPACER, WASHOUT, WOUND CLOSURE;  Surgeon: Pilar Plate, DPM;  Location: [facility];  Service: Orthopedics/Podiatry;  Laterality: Left;   TEE WITHOUT CARDIOVERSION N/A 10/29/2023   Procedure: TRANSESOPHAGEAL ECHOCARDIOGRAM (TEE);  Surgeon: Antonieta Iba, MD;  Location: [facility];  Service: Cardiovascular;  Laterality: N/A;   THORACIC LAMINECTOMY FOR EPIDURAL ABSCESS N/A 11/25/2023   Procedure: T6-T7 thoracic laminectomy, right sided transpedicular decompression and disc debridement;  Surgeon: Lovenia Kim, MD;  Location: [facility];  Service: Neurosurgery;  Laterality: N/A;   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   Procedure: LEFT FOOT 1ST MPJ RESECTION;  Surgeon: Pilar Plate, DPM;  Location: [facility];  Service: Orthopedics/Podiatry;  Laterality: Left;   TRANSMETATARSAL AMPUTATION Left 10/30/2023   Procedure: LEFT FOOT 1ST MPJ RESECTION;  Surgeon: Pilar Plate, DPM;  Location: [facility];  Service: Orthopedics/Podiatry;  Laterality: Left;    Allergies: Diclofenac  Medications: Prior to Admission medications   Medication Sig Start Date End Date Taking? Authorizing Provider  acetaminophen (TYLENOL) 500 MG tablet Take 2 tablets (1,000 mg total) by mouth every 8 (eight) hours as needed. 12/01/23 11/30/24 Yes Joan Flores, PA-C  albuterol (VENTOLIN HFA) 108 (90 Base) MCG/ACT inhaler Inhale  1 puff into the lungs every 6 (six) hours as needed for wheezing or shortness of breath. 12/17/23  Yes Scoggins,  Amber, NP  budesonide-formoterol (SYMBICORT) 160-4.5 MCG/ACT inhaler Inhale 2 puffs into the lungs 2 (two) times daily. 12/17/23  Yes Scoggins, Hospital doctor, NP  metFORMIN (GLUCOPHAGE) 500 MG tablet Take 1 tablet (500 mg total) by mouth 2 (two) times daily with a meal. 12/17/23  Yes Scoggins, Amber, NP  methocarbamol (ROBAXIN) 500 MG tablet Take 1 tablet (500 mg total) by mouth every 6 (six) hours as needed for muscle spasms. 12/09/23  Yes Susanne Borders, PA  metoprolol tartrate (LOPRESSOR) 100 MG tablet Take 1 tablet (100 mg total) by mouth 2 (two) times daily. 12/17/23  Yes Scoggins, Amber, NP  polyethylene glycol powder (GLYCOLAX/MIRALAX) 17 GM/SCOOP powder Take 17 g by mouth daily. Mix as directed. 11/04/23  Yes Gillis Santa, MD  rifampin (RIFADIN) 300 MG capsule Take 1 capsule (300 mg total) by mouth 2 (two) times daily. 01/19/24  Yes Lynn Ito, MD  sulfamethoxazole-trimethoprim (BACTRIM DS) 800-160 MG tablet Take 1 tablet by mouth 2 (two) times daily. 01/19/24  Yes Lynn Ito, MD  vancomycin IVPB Inject 1,250 mg into the vein every 12 (twelve) hours. 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 Labs - Thursday:  BMP and vancomycin trough Goal Vancmycin trough 18-20 mcg/mL Fax weekly lab results  promptly to [phone] Method of administration:Elastomeric Method of administration may be changed at the discretion of the patient and/or caregiver's ability to self-administer the medication ordered. Please pull PIC at completion of IV antibiotics Call [phone] with critical value or questions 11/28/23 01/19/24 Yes Wouk, Wilfred Curtis, MD  baclofen (LIORESAL) 10 MG tablet TAKE 1 TABLET BY MOUTH EVERY DAY Patient not taking: Reported on 01/15/2024 12/15/23   Scoggins, Amber, NP  bisacodyl (DULCOLAX) 5 MG EC tablet Take 2  tablets (10 mg total) by mouth at bedtime as needed for moderate constipation. Patient not taking: Reported on 01/15/2024 12/17/23   Scoggins, [facility], NP  gabapentin (NEURONTIN) 100 MG capsule Take 1 capsule (100 mg total) by mouth 3 (three) times daily. Patient not taking: Reported on 01/15/2024 11/13/23   Candelaria Stagers, DPM  Multiple Vitamin (MULTIVITAMIN WITH MINERALS) TABS tablet Take 1 tablet by mouth daily. Patient not taking: Reported on 01/15/2024 11/05/23   Gillis Santa, MD  rifampin (RIFADIN) 300 MG capsule Take 1 capsule (300 mg total) by mouth every 12 (twelve) hours. Patient not taking: Reported on 01/15/2024 11/28/23 01/19/24  Wouk, Wilfred Curtis, MD     Family History  Problem Relation Age of Onset   Colon cancer Neg Hx     Social History   Socioeconomic History   Marital status: Married    Spouse name: Not on file   Number of children: Not on file   Years of education: Not on file   Highest education level: Not on file  Occupational History   Not on file  Tobacco Use   Smoking status: Former    Current packs/day: 0.50    Average packs/day: 0.5 packs/day for 6.0 years (3.0 ttl pk-yrs)    Types: Cigarettes   Smokeless tobacco: Never   Tobacco comments:    Quit 8/22  Vaping Use   Vaping status: Every Day   Substances: Nicotine  Substance and Sexual Activity   Alcohol use: Not Currently  Comment: 3 times per week   Drug use: Never   Sexual activity: Not on file  Other Topics Concern   Not on file  Social History Narrative   Not on file   Social Drivers of Health   Financial Resource Strain: Not on file  Food Insecurity: No Food Insecurity (01/15/2024)   Hunger Vital Sign    Worried About Running Out of Food in the Last Year: Never true    Ran Out of Food in the Last Year: Never true  Transportation Needs: No Transportation Needs (01/15/2024)   PRAPARE - Administrator, Civil Service (Medical): No    Lack of Transportation (Non-Medical): No  Physical  Activity: Not on file  Stress: Not on file  Social Connections: Not on file     Review of Systems: A 12 point ROS discussed and pertinent positives are indicated in the HPI above.  All other systems are negative.  Review of Systems  Constitutional:  Negative for fatigue and fever.  Respiratory:  Negative for cough and shortness of breath.   Cardiovascular:  Negative for chest pain.  Gastrointestinal:  Negative for abdominal pain, nausea and vomiting.  Musculoskeletal:  Negative for back pain.  Psychiatric/Behavioral:  Negative for behavioral problems and confusion.     Vital Signs: BP 112/83 (BP Location: Left Arm)   Pulse 75   Temp 98.4 F (36.9 C) (Oral)   Resp 18   Ht 5' (1.524 m)   Wt 110 lb (49.9 kg)   LMP 06/01/2022 (Exact Date)   SpO2 100%   BMI 21.48 kg/m   Physical Exam Vitals and nursing note reviewed.  Constitutional:      General: She is not in acute distress.    Appearance: Normal appearance. She is not ill-appearing.  Cardiovascular:     Rate and Rhythm: Normal rate and regular rhythm.  Pulmonary:     Effort: Pulmonary effort is normal.  Abdominal:     General: Abdomen is flat. There is no distension.     Palpations: Abdomen is soft.  Musculoskeletal:        General: Tenderness (lower back pain) present.  Skin:    General: Skin is warm and dry.  Neurological:     General: No focal deficit present.     Mental Status: She is alert and oriented to person, place, and time. Mental status is at baseline.      MD Evaluation Airway: WNL Heart: WNL Abdomen: WNL Chest/ Lungs: WNL ASA  Classification: 3 Mallampati/Airway Score: Two   Imaging: MR Lumbar Spine W Wo Contrast Result Date: 01/15/2024 CLINICAL DATA:  48 year old female with back pain. "Back surgery in February, had infection in her spine". MRSA. Discitis osteomyelitis at T6 through T8 in January and February. EXAM: MRI LUMBAR SPINE WITHOUT AND WITH CONTRAST TECHNIQUE: Multiplanar and  multiecho pulse sequences of the lumbar spine were obtained without and with intravenous contrast. CONTRAST:  5mL GADAVIST GADOBUTROL 1 MMOL/ML IV SOLN COMPARISON:  Thoracic MRI 11/23/2023.  Lumbar MRI 10/23/2023. FINDINGS: Segmentation: Normal, concordant with the numbering in January in February. Alignment: Stable since January. Lumbar lordosis with anterolisthesis of L4 on L5 measuring 7-8 mm. Lesser grade 1 chronic spondylolisthesis at L3-L4 measuring 3-4 mm. Vertebrae: Confluent new marrow edema anteriorly at the L1 and L2 vertebral bodies, more pronounced at the latter. New STIR hyperintensity and enhancement at both of those levels. And intervening L1-L2 disc is now more STIR hyperintense, although not obviously enhancing. Furthermore, there is evidence  of abnormal marrow edema and enhancement in the L2 and L3 spinous processes as seen on series 10, image 7 and series 6, image 7. Enhancing interspinous ligament there also. But L4-L5 STIR hyperintense endplate marrow edema asymmetric to the right is stable since January and appears to be degenerative in nature. Background bone marrow signal remains normal. Visible sacrum and SI joints remain normal. Conus medullaris and cauda equina: Conus extends to the T12-L1 level. No lower spinal cord or conus signal abnormality. No abnormal intradural enhancement. Cauda equina nerve roots appear stable and normal. No lumbar dural thickening. Paraspinal and other soft tissues: Despite the multifocal new marrow edema detailed above there is no convincing paraspinal muscle edema, inflammation, or fluid collection. And the lumbar epidural space appears to remain normal. Negative visible abdominal viscera. Disc levels: Stable degeneration since January, primarily L3-L4 and L4-L5 in the setting of chronic spondylolisthesis at those levels. There is mild chronic spinal stenosis at L4-L5, multifactorial moderate to severe bilateral lateral recess and right foraminal stenosis at  that level. IMPRESSION: 1. Positive for L1-L2 Discitis Osteomyelitis, and L2-L3 spinous process Osteomyelitis, which are new since the Lumbar MRI on 10/23/2023. 2. Negative for epidural or paraspinal abscess. 3. 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 Electronically Signed   By: Odessa Fleming M.D.   On: 01/15/2024 07:42    Labs:  CBC: Recent Labs    11/24/23 0559 11/25/23 0806 11/27/23 0452 01/15/24 0637  WBC 10.9* 10.4 13.9* 9.0  HGB 10.0* 10.1* 9.1* 12.8  HCT 31.1* 31.2* 27.5* 39.2  PLT 680* 548* 471* 410*    COAGS: Recent Labs    10/19/23 0615 11/23/23 2155  INR 1.1 1.1  APTT  --  31    BMP: Recent Labs    11/24/23 0559 11/25/23 0539 11/25/23 0806 11/27/23 0452 01/15/24 0637 01/16/24 0330 01/17/24 0602 01/18/24 0458 01/18/24 1540  NA 135  --  130*  --  135 137  --   --   --   K 4.0  --  4.0  --  4.1 4.1  --   --   --   CL 107  --  101  --  101 102  --   --   --   CO2 20*  --  20*  --  24 23  --   --   --   GLUCOSE 104*  --  161*  --  220* 159*  --   --   --   BUN 15  --  15  --  10 11  --   --   --   CALCIUM 9.2  --  9.2  --  9.9 10.1  --   --   --   CREATININE 0.49   < > 0.41*   < > 0.87 0.59 0.72 0.50 0.53  GFRNONAA >60   < > >60   < > >60 >60 >60 >60 >60   < > = values in this interval not displayed.    LIVER FUNCTION TESTS: Recent Labs    10/19/23 0615 10/28/23 0008 11/23/23 1549 01/15/24 0637  BILITOT 1.4* 0.8 0.7 0.5  AST 27 32 25 16  ALT 13 19 23 9   ALKPHOS 79 137* 105 121  PROT 7.2 6.2* 8.6* 7.6  ALBUMIN 3.7 2.4* 4.0 4.1    TUMOR MARKERS: No results for input(s): "AFPTM", "CEA", "CA199", "CHROMGRNA" in the last 8760 hours.  Assessment  and Plan: Back pain L1/L2 discitis/osteomyelitis L2/L3 spinous process osteomyelitis Patient admitted with back pain despite recent IV abx with Vanc and rifampin.  IR consulted for disc aspiration at with L1/L2 disc  or L2/L3 spinous process.   Case reviewed and approved by Dr. Archer Asa.  Patient has been NPO today.   She is aware of the goals of the procedure and is agreeable to proceed.  Risks and benefits was discussed with the patient and/or patient's family including, but not limited to bleeding, infection, damage to adjacent structures or low yield requiring additional tests.  All of the questions were answered and there is agreement to proceed.  Consent signed and in chart.    Thank you for this interesting consult.  I greatly enjoyed meeting Deyra L Decuir and look forward to participating in their care.  A copy of this report was sent to the requesting provider on this date.  Electronically Signed: Hoyt Koch, PA 01/19/2024, 11:54 AM   I spent a total of 20 Minutes    in face to face in clinical consultation, greater than 50% of which was counseling/coordinating care for L1/L2 discitis, L2/L3 osteomyelitis.

## 2024-01-19 NOTE — Progress Notes (Signed)
 Patient clinically stable post IR Disc aspiration per Dr Archer Asa, tolerated well. Vitals stable pre and post procedure. Received Versed 2 mg along with Fentanyl 100 mcg IV for procedure. Report given to Orlinda Blalock RN post procedure/specials./1

## 2024-01-19 NOTE — Progress Notes (Signed)
 Date of Admission:  01/15/2024      ID: Danielle Harrison is a 48 y.o. female Principal Problem:   Osteomyelitis (HCC) Active Problems:   Acute osteomyelitis of lumbar spine (HCC)   Discitis of lumbar region   Osteomyelitis of lumbar spine (HCC)    Subjective: Pt underwent procedure- disc aspiration today  Medications:   Chlorhexidine Gluconate Cloth  6 each Topical Daily   insulin aspart  0-15 Units Subcutaneous TID WC   insulin aspart  0-5 Units Subcutaneous QHS   insulin glargine-yfgn  5 Units Subcutaneous QHS   metoprolol tartrate  100 mg Oral BID   mometasone-formoterol  2 puff Inhalation BID   polyethylene glycol  17 g Oral Daily   rifampin  300 mg Oral Q12H   sodium chloride flush  10-40 mL Intracatheter Q12H    Objective: Vital signs in last 24 hours: Patient Vitals for the past 24 hrs:  BP Temp Temp src Pulse Resp SpO2  01/19/24 1121 112/83 98.4 F (36.9 C) Oral 75 18 --  01/19/24 0932 116/79 -- -- 83 -- 100 %  01/19/24 0809 110/76 98.7 F (37.1 C) Oral 83 18 100 %  01/19/24 0542 (!) 94/59 98.1 F (36.7 C) -- 79 18 100 %  01/18/24 2340 98/63 98.1 F (36.7 C) -- 77 18 99 %  01/18/24 1930 108/78 98.2 F (36.8 C) -- 84 18 99 %  01/18/24 1547 107/70 98.1 F (36.7 C) Oral 77 17 --  01/18/24 1145 113/70 97.8 F (36.6 C) Oral 75 18 --     LDA Rt PICC  PHYSICAL EXAM:  General: Alert, cooperative, no distress, appears stated age.  Back: thoracic scar healed well. Lungs: Clear to auscultation bilaterally. No Wheezing or Rhonchi. No rales. Heart: Regular rate and rhythm, no murmur, rub or gallop. Abdomen: Soft, non-tender,not distended. Bowel sounds normal. No masses Extremities: atraumatic, no cyanosis. No edema. No clubbing Skin: No rashes or lesions. Or bruising Lymph: Cervical, supraclavicular normal. Neurologic: Grossly non-focal  Lab Results    Latest Ref Rng & Units 01/15/2024    6:37 AM 11/27/2023    4:52 AM 11/25/2023    8:06 AM  CBC  WBC 4.0 -  10.5 K/uL 9.0  13.9  10.4   Hemoglobin 12.0 - 15.0 g/dL 16.1  9.1  09.6   Hematocrit 36.0 - 46.0 % 39.2  27.5  31.2   Platelets 150 - 400 K/uL 410  471  548        Latest Ref Rng & Units 01/18/2024    3:40 PM 01/18/2024    4:58 AM 01/17/2024    6:02 AM  CMP  Creatinine 0.44 - 1.00 mg/dL 0.45  4.09  8.11       Microbiology: [facility] NG Studies/Results: No results found.  April 2025  Feb 2025   L1-L2 discitis osteo L2_l3 spinous process osteo  Assessment/Plan: New Lumbar back pain with L1-L2 marrow edema questioning osteomyelitis While on Vanco and rifampin and having therapeutic values and also normal ESR and CRP on 4/1 It is strange that the progression is happening on already 6 weeks of IV Though the thoracic MRI from Feb did show L2 vertebral body edema Aspiration attempted today from disc and facet area- one culture has been sent Will check with IR    T6-T7 discitis with fusion surgery Pt is on vanco + rifampin and it will be 8 weeks on 01/19/24, may have to extend it or do PO linezolid and Po rifampin  DM   H/o pancreatitis  Discussed the management with patient

## 2024-01-20 DIAGNOSIS — M8618 Other acute osteomyelitis, other site: Secondary | ICD-10-CM | POA: Diagnosis not present

## 2024-01-20 LAB — GLUCOSE, CAPILLARY
Glucose-Capillary: 151 mg/dL — ABNORMAL HIGH (ref 70–99)
Glucose-Capillary: 221 mg/dL — ABNORMAL HIGH (ref 70–99)
Glucose-Capillary: 96 mg/dL (ref 70–99)
Glucose-Capillary: 161 mg/dL — ABNORMAL HIGH (ref 70–99)
Glucose-Capillary: 120 mg/dL — ABNORMAL HIGH (ref 70–99)

## 2024-01-20 LAB — CULTURE, BLOOD (ROUTINE X 2)
Culture: NO GROWTH
Culture: NO GROWTH

## 2024-01-20 LAB — SEDIMENTATION RATE: Sed Rate: 39 mm/h — ABNORMAL HIGH (ref 0–20)

## 2024-01-20 LAB — C-REACTIVE PROTEIN: CRP: 0.8 mg/dL (ref <1.0)

## 2024-01-20 MED ORDER — HEPARIN SOD (PORK) LOCK FLUSH 100 UNIT/ML IV SOLN
250.0000 [IU] | INTRAVENOUS | Status: DC | PRN
  Filled 2024-01-20: qty 5

## 2024-01-20 MED ORDER — SODIUM CHLORIDE 0.9 % IV SOLN: INTRAVENOUS | Status: AC | PRN

## 2024-01-20 MED ORDER — ALTEPLASE 2 MG IJ SOLR
2.0000 mg | Freq: Once | INTRAMUSCULAR | Status: AC
  Administered 2024-01-20: 2 mg
  Filled 2024-01-20: qty 2

## 2024-01-20 NOTE — Progress Notes (Signed)
 PROGRESS NOTE    Danielle Harrison  YNW:[id number] DOB: 08/23/1976 DOA: 01/15/2024 PCP: [name], NP    Brief Narrative:  48 y.o. 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  Patient reported that she has been compliant with IV antibiotics 3 times a day and she also reported that about 9 to 10 days ago vancomycin dosage was increased from 1500 to 2500 mg every 8 hours.  3 days ago, she started develop new onset of lower back pain, she reported the location is lower than the thoracic spine pain she had 2 months ago, gradually getting worse but she denied any weakness of her legs and no trouble urinating or making bowel movements.  Denied any fever or chills.   ID following. IR/neurosurgery was consulted for possible aspiration but was discontinued due to not having enough fluid collection for intervention.   4/8: IR reengaged.  Status post image guided aspiration of L1-L2 disc and LT L3 spinous process yielding a tiny amount of bloody fluid.  Sent to lab.  No cultures growth as of yet.   Assessment & Plan:   Principal Problem:   Osteomyelitis (HCC) Active Problems:   Acute osteomyelitis of lumbar spine (HCC)   Discitis of lumbar region   Osteomyelitis of lumbar spine (HCC)  Acute lumbar osteomyelitis/discitis -while  maintaining good antibiotic adherence outpatient - ID managing  Plan: -Continue IV vancomycin and rifampin per ID recommendations -Follow cultures from disc aspiration 4/7.  No growth to date -Multimodal pain regimen, minimize IV narcotic use   Asthma, unknown severity -No symptoms signs of flareup, continue as needed bronchodilators   Hypertension -Continue metoprolol   Type 2 diabetes mellitus with hyperglycemia -Most recent A1c 6.4 in January -Semglee 5 units daily -Moderate sliding scale -Carb modified diet   DVT prophylaxis: Lovenox Code Status: Full Family Communication:None Disposition Plan: Status is: Inpatient Remains inpatient appropriate because: Pain control   Level of care: Med-Surg  Consultants:  ID Neurosurgery  Procedures:  None  Antimicrobials: Vancomycin Rifampin   Subjective: Seen and examined.  No acute events overnight.  No new complaints.  Objective: Vitals:   01/19/24 1954 01/19/24 2159 01/20/24 0316 01/20/24 0806  BP: 105/80 115/74 114/76 109/69  Pulse: 78 83 74 71  Resp: 18 17 18 20   Temp: 98.9 F (37.2 C) 97.8 F (36.6 C) 97.9 F (36.6 C) 97.7 F (36.5 C)  TempSrc:   Oral   SpO2: 100% 100% 98% 98%  Weight:      Height:        Intake/Output Summary (Last 24 hours) at 01/20/2024 1114 Last data filed at 01/20/2024 0720 Gross per 24 hour  Intake 579.38 ml  Output --  Net 579.38 ml   Filed Weights   01/15/24 0542  Weight: 49.9 kg    Examination:  General exam: NAD Respiratory system: Lung clear.  Normal work of breathing.  Room air Cardiovascular system: S1-S2, RRR, no murmurs, no pedal edema Gastrointestinal system: Soft, NT/ND, normal bowel sounds Central nervous system:  Alert and oriented. No focal neurological deficits. Extremities: Decreased power bilateral lower extremities.  Ambulating with minimal difficulty Skin: No rashes, lesions or ulcers Psychiatry: Judgement and insight appear normal. Mood & affect  appropriate.     Data Reviewed: I have personally reviewed following labs and imaging studies  CBC: Recent Labs  Lab 01/15/24 0637  WBC 9.0  HGB 12.8  HCT 39.2  MCV 92.0  PLT 410*   Basic Metabolic Panel: Recent Labs  Lab 01/15/24 0637 01/16/24 0330 01/17/24 0602 01/18/24 0458 01/18/24 1540  NA 135 137  --   --   --   K 4.1 4.1  --   --   --   CL 101 102  --   --   --   CO2 24 23  --   --   --   GLUCOSE 220* 159*  --   --   --   BUN 10 11  --   --   --   CREATININE 0.87 0.59 0.72 0.50 0.53  CALCIUM 9.9 10.1  --   --   --    GFR: Estimated Creatinine Clearance: 62.4 mL/min (by C-G formula based on SCr of 0.53 mg/dL). Liver Function Tests: Recent Labs  Lab 01/15/24 0637  AST 16  ALT 9  ALKPHOS 121  BILITOT 0.5  PROT 7.6  ALBUMIN 4.1   No results for input(s): "LIPASE", "AMYLASE" in the last 168 hours. No results for input(s): "AMMONIA" in the last 168 hours. Coagulation Profile: No results for input(s): "INR", "PROTIME" in the last 168 hours. Cardiac Enzymes: No results for input(s): "CKTOTAL", "CKMB", "CKMBINDEX", "TROPONINI" in the last 168 hours. BNP (last 3 results) No results for input(s): "PROBNP" in the last 8760 hours. HbA1C: No results for input(s): "HGBA1C" in the last 72 hours. CBG: Recent Labs  Lab 01/19/24 1426 01/19/24 1652 01/19/24 1752 01/19/24 2203 01/20/24 0803  GLUCAP 229* 151* 215* 161* 221*   Lipid Profile: No results for input(s): "CHOL", "HDL", "LDLCALC", "TRIG", "CHOLHDL", "LDLDIRECT" in the last 72 hours. Thyroid Function Tests: No results for input(s): "TSH", "T4TOTAL", "FREET4", "T3FREE", "THYROIDAB" in the last 72 hours. Anemia Panel: No results for input(s): "VITAMINB12", "FOLATE", "FERRITIN", "TIBC", "IRON", "RETICCTPCT" in the last 72 hours. Sepsis Labs: No results for input(s): "PROCALCITON", "LATICACIDVEN" in the last 168 hours.  Recent Results (from the past 240 hours)  Blood culture (routine x 2)     Status:  None   Collection Time: 01/15/24 10:10 AM   Specimen: BLOOD  Result Value Ref Range Status   Specimen Description BLOOD PICC LINE  Final   Special Requests   Final    BOTTLES DRAWN AEROBIC AND ANAEROBIC Blood Culture results may not be optimal due to an inadequate volume of blood received in culture bottles   Culture   Final    NO GROWTH 5 DAYS Performed at [facility], [address] [place], [place] [address]    Report Status 01/20/2024 FINAL  Final  Blood culture (routine x 2)     Status: None   Collection Time: 01/15/24 10:10 AM   Specimen: BLOOD  Result Value Ref Range Status   Specimen Description BLOOD LEFT ANTECUBITAL  Final   Special Requests   Final    BOTTLES DRAWN AEROBIC AND ANAEROBIC Blood Culture results may not be optimal due to an inadequate volume of blood received in culture bottles   Culture   Final    NO GROWTH 5 DAYS Performed at [facility], [address] [place], [place] [address]    Report Status 01/20/2024 FINAL  Final  Surgical pcr screen     Status: None   Collection Time: 01/16/24 12:03 PM   Specimen: Nasal Mucosa; Nasal Swab  Result Value Ref Range Status   MRSA, PCR NEGATIVE NEGATIVE Final   Staphylococcus aureus NEGATIVE NEGATIVE Final    Comment: (NOTE) The Xpert SA Assay (FDA approved for NASAL specimens in patients 41 years of age and older), is one component of a comprehensive surveillance program. It is not intended to diagnose infection nor to guide or monitor treatment. Performed at [facility], [address] [place], [place] [address]   Aerobic/Anaerobic Culture w Gram Stain (surgical/deep wound)     Status: None (Preliminary result)   Collection Time: 01/19/24  4:56 PM   Specimen: Fine Needle Aspirate  Result Value Ref Range Status   Specimen Description   Final    NEEDLE ASPIRATE Performed at [facility], [address] [place], [place] [address]    Special Requests   Final     intervertebral disc Performed at [facility], [address] [place], [place] [address]    Gram Stain NO WBC SEEN NO ORGANISMS SEEN   Final   Culture   Final    NO GROWTH < 12 HOURS Performed at [facility], [address], [place], [place] [address]    Report Status PENDING  Incomplete         Radiology Studies: IR LUMBAR DISC ASPIRATION W/IMG GUIDE Result Date: 01/20/2024 INDICATION: 48 year old female with breakthrough L1-L2 discitis/osteomyelitis and inflammatory changes at the L2-L3 spinous processes despite being on intravenous antibiotics. Image guided aspiration is requested. While there are inflammatory changes and abnormal enhancement at both sites, no focal fluid collection is present on prior MR imaging. Therefore, we will attempt aspiration first at the spinous processes. If there is no yield, we will then proceed to disc aspiration. EXAM: 1. Fluoroscopic guided aspiration of the L2-L3 interspinous ligament. 2. Fluoroscopic guided aspiration of the L1-L2 disc MEDICATIONS: The patient is currently admitted to the hospital and receiving intravenous antibiotics. The antibiotics were administered within an appropriate time frame prior to the initiation of the procedure. ANESTHESIA/SEDATION: Fentanyl 100 mcg IV; Versed 2 mg IV administered by the radiology nurse Moderate Sedation Time:  22 minutes The patient's vital signs and level of consciousness were continuously monitored during the procedure by the interventional radiology nurse under my direct supervision. COMPLICATIONS: None immediate. PROCEDURE: Informed written consent was obtained from the patient after a thorough discussion of the procedural risks, benefits and alternatives. All questions were addressed. Maximal Sterile Barrier Technique was utilized including caps, mask, sterile gowns, sterile gloves, sterile drape, hand hygiene and skin antiseptic. A timeout was performed prior to the initiation of the  procedure. The L2-L3 inter spinous ligament was first targeted. The skin was marked and sterilely prepped in the standard fashion using Betadine skin prep. Local anesthesia was attained by infiltration with 1% lidocaine. A 20 gauge spinal needle was carefully advanced onto the spinous processes followed by insertion into the inter spinous ligament. Aspiration was attempted all throughout the inter spinous ligament and along the undersurface of the L2 spinous process. No purulence was identified. A trace amount of bloody fluid was aspirated. The decision was made to proceed to L1-L2 disc aspiration. Using fluoroscopic guidance the disc space was identified and marked. The region is already prepped and draped as above. Local  anesthesia was attained by infiltration with 1% lidocaine. A 20 gauge spinal needle was then carefully advanced into the disc space. Again, aspiration was attempted. No purulence or fluid detected. A small amount of saline was flushed into the disc and then re- aspirated yielding less than 1 mL of mildly bloody fluid. The 2 samples were combined sent to pathology for attempt at culture. IMPRESSION: Attempted fluoroscopic guided aspiration of the L2-L3 inter spinous ligament and the L1-L2 disc. Neither site yielded any purulent fluid. A trace amount of bloody fluid and a less than 1 mL wash of saline flushed into the disc space and then aspirated were collected, combined and sent for culture. Electronically Signed   By: Malachy Moan M.D.   On: 01/20/2024 09:08        Scheduled Meds:  Chlorhexidine Gluconate Cloth  6 each Topical Daily   insulin aspart  0-15 Units Subcutaneous TID WC   insulin aspart  0-5 Units Subcutaneous QHS   insulin glargine-yfgn  5 Units Subcutaneous QHS   metoprolol tartrate  100 mg Oral BID   mometasone-formoterol  2 puff Inhalation BID   polyethylene glycol  17 g Oral Daily   rifampin  300 mg Oral Q12H   sodium chloride flush  10-40 mL Intracatheter  Q12H   Continuous Infusions:  sodium chloride 10 mL/hr at 01/20/24 0448   vancomycin Stopped (01/20/24 0720)     LOS: 5 days      Tresa Moore, MD [facility]   If 7PM-7AM, please contact night-coverage  01/20/2024, 11:14 AM

## 2024-01-21 ENCOUNTER — Encounter: Admitting: Neurosurgery

## 2024-01-21 ENCOUNTER — Other Ambulatory Visit: Payer: Self-pay

## 2024-01-21 DIAGNOSIS — M8618 Other acute osteomyelitis, other site: Secondary | ICD-10-CM | POA: Diagnosis not present

## 2024-01-21 LAB — GLUCOSE, CAPILLARY: Glucose-Capillary: 235 mg/dL — ABNORMAL HIGH (ref 70–99)

## 2024-01-21 MED ORDER — RIFAMPIN 300 MG PO CAPS: 300.0000 mg | ORAL_CAPSULE | Freq: Two times a day (BID) | ORAL | Status: DC

## 2024-01-21 MED ORDER — SODIUM CHLORIDE 0.9 % IV SOLN: INTRAVENOUS | Status: DC | PRN

## 2024-01-21 MED ORDER — LINEZOLID 600 MG PO TABS
600.0000 mg | ORAL_TABLET | Freq: Two times a day (BID) | ORAL | 0 refills | Status: DC
  Filled 2024-01-21: qty 28, 14d supply, fill #0

## 2024-01-21 MED ORDER — LINEZOLID 600 MG PO TABS
600.0000 mg | ORAL_TABLET | Freq: Two times a day (BID) | ORAL | Status: DC
  Administered 2024-01-21: 600 mg via ORAL
  Filled 2024-01-21: qty 1

## 2024-01-21 MED ORDER — OXYCODONE HCL 5 MG PO TABS
5.0000 mg | ORAL_TABLET | Freq: Four times a day (QID) | ORAL | 0 refills | Status: DC | PRN
Start: 2024-01-21 — End: 2024-01-29
  Filled 2024-01-21: qty 30, 4d supply, fill #0

## 2024-01-21 MED ORDER — RIFAMPIN 300 MG PO CAPS
300.0000 mg | ORAL_CAPSULE | Freq: Two times a day (BID) | ORAL | 0 refills | Status: AC
  Filled 2024-01-21: qty 28, 14d supply, fill #0

## 2024-01-21 NOTE — Progress Notes (Signed)
   Date of Admission:  01/15/2024      ID: Danielle Harrison is a 48 y.o. female Principal Problem:   Osteomyelitis (HCC) Active Problems:   Acute osteomyelitis of lumbar spine (HCC)   Discitis of lumbar region   Osteomyelitis of lumbar spine (HCC)    Subjective: Feeling okay      Objective: Vital signs in last 24 hours: Patient Vitals for the past 24 hrs:  BP Temp Temp src Pulse Resp SpO2  01/21/24 1158 108/80 97.9 F (36.6 C) Oral -- 18 (!) 79 %  01/21/24 0810 115/81 97.9 F (36.6 C) Oral 77 18 100 %  01/21/24 0317 106/76 98 F (36.7 C) -- 61 18 99 %  01/20/24 2217 108/71 98.1 F (36.7 C) Oral 79 -- 100 %     LDA Rt PICC out  PHYSICAL EXAM:  General: Alert, cooperative, no distress, appears stated age.  Back: thoracic scar healed well. Lungs: Clear to auscultation bilaterally. No Wheezing or Rhonchi. No rales. Heart: Regular rate and rhythm, no murmur, rub or gallop. Abdomen: Soft, non-tender,not distended. Bowel sounds normal. No masses Extremities: atraumatic, no cyanosis. No edema. No clubbing Skin: No rashes or lesions. Or bruising Lymph: Cervical, supraclavicular normal. Neurologic: Grossly non-focal  Lab Results    Latest Ref Rng & Units 01/15/2024    6:37 AM 11/27/2023    4:52 AM 11/25/2023    8:06 AM  CBC  WBC 4.0 - 10.5 K/uL 9.0  13.9  10.4   Hemoglobin 12.0 - 15.0 g/dL 16.1  9.1  09.6   Hematocrit 36.0 - 46.0 % 39.2  27.5  31.2   Platelets 150 - 400 K/uL 410  471  548        Latest Ref Rng & Units 01/18/2024    3:40 PM 01/18/2024    4:58 AM 01/17/2024    6:02 AM  CMP  Creatinine 0.44 - 1.00 mg/dL 0.45  4.09  8.11       Microbiology: [facility] NG Studies/Results: No results found.  April 2025  Feb 2025   L1-L2 discitis osteo L2_l3 spinous process osteo  Assessment/Plan: New Lumbar back pain with L1-L2 marrow edema questioning osteomyelitis While on Vanco and rifampin and having therapeutic values and also normal ESR and CRP on 4/1 It is  strange that the progression is happening on already 6 weeks of IV Though the thoracic MRI from Feb did show L2 vertebral body edema Aspiration attempted today from disc and facet area- one culture has been sent Will check with IR    T6-T7 discitis with fusion surgery Pt is on vanco + rifampin and it will be 8 weeks on 01/19/24,change to PO linezolid and Po rifampin with close monitoring for any side effects or lower efficacy of linezolid    DM   H/o pancreatitis  Discussed the management with patient and her friend Follow up next week in my office

## 2024-01-21 NOTE — Discharge Summary (Signed)
 Physician Discharge Summary   Elaine Roanhorse Mullet  female DOB: 1976/03/25  RKY:[id number]  PCP: [name], NP  Admit date: 01/15/2024 Discharge date: 01/21/2024  Admitted From: home Disposition:  home CODE STATUS: Full code  Discharge Instructions     No wound care   Complete by: As directed       Hospital Course:  For full details, please see H&P, progress notes, consult notes and ancillary notes.  Briefly,  Danielle Harrison is a 48 y.o. 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  Patient reported that she has been compliant with IV antibiotics 3 times a day and she also reported that about 9 to 10 days ago vancomycin dosage was increased from 1500 to 2500 mg every 8 hours.  3 days ago, she started develop new onset of lower back pain, she reported the location is lower than the thoracic spine pain she had 2 months ago, gradually getting worse but she denied any weakness of her legs and no trouble urinating or making bowel movements.  Denied any fever or chills.   4/8: Status post IR image guided aspiration of L1-L2 disc and LT L3 spinous process yielding a tiny amount of bloody fluid.  Sent to lab.  No cultures growth as of yet.  Acute lumbar osteomyelitis/discitis -while maintaining good antibiotic adherence  outpatient --received IV vancomycin and rifampin during hospitalization, discharged on Linezolid and rifampin.  Will follow up with ID Dr. Rivka Safer on 01/29/24 for further abx adjustment.   Asthma, unknown severity -No symptoms signs of flareup --continue home bronchodilators   Hypertension -Continue metoprolol   Type 2 diabetes mellitus with hyperglycemia -Most recent A1c 6.4 in January --resume home metformin after discharge.   Unless noted above, medications under "STOP" list are ones pt was not taking PTA.  Discharge Diagnoses:  Principal Problem:   Osteomyelitis (HCC) Active Problems:   Acute osteomyelitis of lumbar spine (HCC)   Discitis of lumbar region   Osteomyelitis of lumbar spine (HCC)   30 Day Unplanned Readmission Risk Score    Flowsheet Row ED to Hosp-Admission (Current) from 01/15/2024 in [facility]  30 Day Unplanned Readmission Risk Score (%) 18.88 Filed at 01/21/2024 1200       This score is the patient's risk of an unplanned readmission within 30 days of being discharged (0 -100%). The score is based on dignosis, age, lab data, medications, orders, and past utilization.   Low:  0-14.9   Medium: 15-21.9   High: 22-29.9   Extreme: 30 and above         Discharge Instructions:  Allergies as of 01/21/2024       Reactions   Diclofenac Hives        Medication List     STOP taking these medications    baclofen 10 MG tablet Commonly known as: LIORESAL   bisacodyl 5 MG EC tablet Commonly known as: DULCOLAX   CertaVite/Antioxidants Tabs   gabapentin 100 MG capsule Commonly known as:  NEURONTIN   sulfamethoxazole-trimethoprim 800-160 MG tablet Commonly known as: Bactrim DS   vancomycin IVPB       TAKE these medications    acetaminophen 500 MG tablet Commonly known as: TYLENOL Take 2 tablets (1,000 mg total) by mouth every 8 (eight) hours as needed.   albuterol 108 (90 Base) MCG/ACT inhaler Commonly  known as: VENTOLIN HFA Inhale 1 puff into the lungs every 6 (six) hours as needed for wheezing or shortness of breath.   budesonide-formoterol 160-4.5 MCG/ACT inhaler Commonly known as: SYMBICORT Inhale 2 puffs into the lungs 2 (two) times daily.   linezolid 600 MG tablet Commonly known as: ZYVOX Take 1 tablet (600 mg total) by mouth 2 (two) times daily for 14 days.   metFORMIN 500 MG tablet Commonly known as: GLUCOPHAGE Take 1 tablet (500 mg total) by mouth 2 (two) times daily with a meal.   methocarbamol 500 MG tablet Commonly known as: ROBAXIN Take 1 tablet (500 mg total) by mouth every 6 (six) hours as needed for muscle spasms.   metoprolol tartrate 100 MG tablet Commonly known as: LOPRESSOR Take 1 tablet (100 mg total) by mouth 2 (two) times daily.   oxyCODONE 5 MG immediate release tablet Commonly known as: Oxy IR/ROXICODONE Take 1-2 tablets (5-10 mg total) by mouth every 6 (six) hours as needed for severe pain (pain score 7-10).   polyethylene glycol powder 17 GM/SCOOP powder Commonly known as: GLYCOLAX/MIRALAX Take 17 g by mouth daily. Mix as directed.   rifampin 300 MG capsule Commonly known as: RIFADIN Take 1 capsule (300 mg total) by mouth 2 (two) times daily for 14 days. What changed: Another medication with the same name was removed. Continue taking this medication, and follow the directions you see here.         Follow-up Information     Scoggins, Amber, NP Follow up in 1 week(s).   Specialty: Cardiology Contact information: [address] [place] [address] [phone]                 Allergies  Allergen Reactions   Diclofenac Hives     The results of significant diagnostics from this hospitalization (including imaging, microbiology, ancillary and laboratory) are listed below for reference.   Consultations:   Procedures/Studies: IR LUMBAR DISC ASPIRATION W/IMG GUIDE Result Date: 01/20/2024 INDICATION: 48 year old female with  breakthrough L1-L2 discitis/osteomyelitis and inflammatory changes at the L2-L3 spinous processes despite being on intravenous antibiotics. Image guided aspiration is requested. While there are inflammatory changes and abnormal enhancement at both sites, no focal fluid collection is present on prior MR imaging. Therefore, we will attempt aspiration first at the spinous processes. If there is no yield, we will then proceed to disc aspiration. EXAM: 1. Fluoroscopic guided aspiration of the L2-L3 interspinous ligament. 2. Fluoroscopic guided aspiration of the L1-L2 disc MEDICATIONS: The patient is currently admitted to the hospital and receiving intravenous antibiotics. The antibiotics were administered within an appropriate time frame prior to the initiation of the procedure. ANESTHESIA/SEDATION: Fentanyl 100 mcg IV; Versed 2 mg IV administered by the radiology nurse Moderate Sedation Time:  22 minutes The patient's vital signs and level of consciousness were continuously monitored during the procedure by the interventional radiology nurse under my direct supervision. COMPLICATIONS: None immediate. PROCEDURE: Informed written consent was obtained from the patient after a thorough discussion of the procedural risks, benefits and alternatives. All questions were addressed. Maximal Sterile Barrier Technique was utilized including caps, mask, sterile gowns, sterile gloves, sterile drape,  hand hygiene and skin antiseptic. A timeout was performed prior to the initiation of the procedure. The L2-L3 inter spinous ligament was first targeted. The skin was marked and sterilely prepped in the standard fashion using Betadine skin prep. Local anesthesia was attained by infiltration with 1% lidocaine. A 20 gauge spinal needle was carefully advanced onto the spinous processes followed by insertion into the inter spinous ligament. Aspiration was attempted all throughout the inter spinous ligament and along the undersurface of the L2  spinous process. No purulence was identified. A trace amount of bloody fluid was aspirated. The decision was made to proceed to L1-L2 disc aspiration. Using fluoroscopic guidance the disc space was identified and marked. The region is already prepped and draped as above. Local anesthesia was attained by infiltration with 1% lidocaine. A 20 gauge spinal needle was then carefully advanced into the disc space. Again, aspiration was attempted. No purulence or fluid detected. A small amount of saline was flushed into the disc and then re- aspirated yielding less than 1 mL of mildly bloody fluid. The 2 samples were combined sent to pathology for attempt at culture. IMPRESSION: Attempted fluoroscopic guided aspiration of the L2-L3 inter spinous ligament and the L1-L2 disc. Neither site yielded any purulent fluid. A trace amount of bloody fluid and a less than 1 mL wash of saline flushed into the disc space and then aspirated were collected, combined and sent for culture. Electronically Signed   By: Malachy Moan M.D.   On: 01/20/2024 09:08   MR Lumbar Spine W Wo Contrast Result Date: 01/15/2024 CLINICAL DATA:  48 year old female with back pain. "Back surgery in February, had infection in her spine". MRSA. Discitis osteomyelitis at T6 through T8 in January and February. EXAM: MRI LUMBAR SPINE WITHOUT AND WITH CONTRAST TECHNIQUE: Multiplanar and multiecho pulse sequences of the lumbar spine were obtained without and with intravenous contrast. CONTRAST:  5mL GADAVIST GADOBUTROL 1 MMOL/ML IV SOLN COMPARISON:  Thoracic MRI 11/23/2023.  Lumbar MRI 10/23/2023. FINDINGS: Segmentation: Normal, concordant with the numbering in January in February. Alignment: Stable since January. Lumbar lordosis with anterolisthesis of L4 on L5 measuring 7-8 mm. Lesser grade 1 chronic spondylolisthesis at L3-L4 measuring 3-4 mm. Vertebrae: Confluent new marrow edema anteriorly at the L1 and L2 vertebral bodies, more pronounced at the latter.  New STIR hyperintensity and enhancement at both of those levels. And intervening L1-L2 disc is now more STIR hyperintense, although not obviously enhancing. Furthermore, there is evidence of abnormal marrow edema and enhancement in the L2 and L3 spinous processes as seen on series 10, image 7 and series 6, image 7. Enhancing interspinous ligament there also. But L4-L5 STIR hyperintense endplate marrow edema asymmetric to the right is stable since January and appears to be degenerative in nature. Background bone marrow signal remains normal. Visible sacrum and SI joints remain normal. Conus medullaris and cauda equina: Conus extends to the T12-L1 level. No lower spinal cord or conus signal abnormality. No abnormal intradural enhancement. Cauda equina nerve roots appear stable and normal. No lumbar dural thickening. Paraspinal and other soft tissues: Despite the multifocal new marrow edema detailed above there is no convincing paraspinal muscle edema, inflammation, or fluid collection. And the lumbar epidural space appears to remain normal. Negative visible abdominal viscera. Disc levels: Stable degeneration since January, primarily L3-L4 and L4-L5 in the setting of chronic spondylolisthesis at those levels. There is mild chronic spinal stenosis at L4-L5, multifactorial moderate to severe bilateral lateral recess and right foraminal stenosis at that  level. IMPRESSION: 1. Positive for L1-L2 Discitis Osteomyelitis, and L2-L3 spinous process Osteomyelitis, which are new since the Lumbar MRI on 10/23/2023. 2. Negative for epidural or paraspinal abscess. 3. 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 Electronically Signed   By: Odessa Fleming M.D.   On: 01/15/2024 07:42      Labs: BNP (last 3 results) No results for input(s): "BNP" in the last 8760 hours. Basic Metabolic Panel: Recent Labs  Lab 01/15/24 0637 01/16/24 0330  01/17/24 0602 01/18/24 0458 01/18/24 1540  NA 135 137  --   --   --   K 4.1 4.1  --   --   --   CL 101 102  --   --   --   CO2 24 23  --   --   --   GLUCOSE 220* 159*  --   --   --   BUN 10 11  --   --   --   CREATININE 0.87 0.59 0.72 0.50 0.53  CALCIUM 9.9 10.1  --   --   --    Liver Function Tests: Recent Labs  Lab 01/15/24 0637  AST 16  ALT 9  ALKPHOS 121  BILITOT 0.5  PROT 7.6  ALBUMIN 4.1   No results for input(s): "LIPASE", "AMYLASE" in the last 168 hours. No results for input(s): "AMMONIA" in the last 168 hours. CBC: Recent Labs  Lab 01/15/24 0637  WBC 9.0  HGB 12.8  HCT 39.2  MCV 92.0  PLT 410*   Cardiac Enzymes: No results for input(s): "CKTOTAL", "CKMB", "CKMBINDEX", "TROPONINI" in the last 168 hours. BNP: Invalid input(s): "POCBNP" CBG: Recent Labs  Lab 01/20/24 0803 01/20/24 1130 01/20/24 1653 01/20/24 2214 01/21/24 0812  GLUCAP 221* 96 161* 120* 235*   D-Dimer No results for input(s): "DDIMER" in the last 72 hours. Hgb A1c No results for input(s): "HGBA1C" in the last 72 hours. Lipid Profile No results for input(s): "CHOL", "HDL", "LDLCALC", "TRIG", "CHOLHDL", "LDLDIRECT" in the last 72 hours. Thyroid function studies No results for input(s): "TSH", "T4TOTAL", "T3FREE", "THYROIDAB" in the last 72 hours.  Invalid input(s): "FREET3" Anemia work up No results for input(s): "VITAMINB12", "FOLATE", "FERRITIN", "TIBC", "IRON", "RETICCTPCT" in the last 72 hours. Urinalysis    Component Value Date/Time   COLORURINE YELLOW (A) 10/23/2023 1134   APPEARANCEUR HAZY (A) 10/23/2023 1134   APPEARANCEUR Clear 03/01/2014 1301   LABSPEC 1.015 10/23/2023 1134   LABSPEC 1.012 03/01/2014 1301   PHURINE 6.0 10/23/2023 1134   GLUCOSEU >=500 (A) 10/23/2023 1134   GLUCOSEU 50 mg/dL 16/07/9603 5409   HGBUR SMALL (A) 10/23/2023 1134   BILIRUBINUR NEGATIVE 10/23/2023 1134   BILIRUBINUR Negative 03/01/2014 1301   KETONESUR NEGATIVE 10/23/2023 1134    PROTEINUR 30 (A) 10/23/2023 1134   NITRITE NEGATIVE 10/23/2023 1134   LEUKOCYTESUR NEGATIVE 10/23/2023 1134   LEUKOCYTESUR Negative 03/01/2014 1301   Sepsis Labs Recent Labs  Lab 01/15/24 0637  WBC 9.0   Microbiology Recent Results (from the past 240 hours)  Blood culture (routine x 2)     Status: None   Collection Time: 01/15/24 10:10 AM   Specimen: BLOOD  Result Value Ref Range Status   Specimen Description BLOOD PICC LINE  Final   Special Requests   Final    BOTTLES DRAWN AEROBIC AND ANAEROBIC Blood Culture results may not be optimal due to an inadequate volume of blood received in culture bottles  Culture   Final    NO GROWTH 5 DAYS Performed at [facility], [address] [place], [place] [address]    Report Status 01/20/2024 FINAL  Final  Blood culture (routine x 2)     Status: None   Collection Time: 01/15/24 10:10 AM   Specimen: BLOOD  Result Value Ref Range Status   Specimen Description BLOOD LEFT ANTECUBITAL  Final   Special Requests   Final    BOTTLES DRAWN AEROBIC AND ANAEROBIC Blood Culture results may not be optimal due to an inadequate volume of blood received in culture bottles   Culture   Final    NO GROWTH 5 DAYS Performed at [facility], [address], [place], [place] [address]    Report Status 01/20/2024 FINAL  Final  Surgical pcr screen     Status: None   Collection Time: 01/16/24 12:03 PM   Specimen: Nasal Mucosa; Nasal Swab  Result Value Ref Range Status   MRSA, PCR NEGATIVE NEGATIVE Final   Staphylococcus aureus NEGATIVE NEGATIVE Final    Comment: (NOTE) The Xpert SA Assay (FDA approved for NASAL specimens in patients 49 years of age and older), is one component of a comprehensive surveillance program. It is not intended to diagnose infection nor to guide or monitor treatment. Performed at [facility], [address] [place], [place] [address]   Aerobic/Anaerobic Culture w Gram Stain (surgical/deep wound)      Status: None (Preliminary result)   Collection Time: 01/19/24  4:56 PM   Specimen: Fine Needle Aspirate  Result Value Ref Range Status   Specimen Description   Final    NEEDLE ASPIRATE Performed at [facility], [address], [place], [place] [address]    Special Requests   Final    intervertebral disc Performed at [facility], [address] [place], [place] [address]    Gram Stain NO WBC SEEN NO ORGANISMS SEEN   Final   Culture   Final    NO GROWTH < 12 HOURS Performed at [facility], [address], [place], [place] [address]    Report Status PENDING  Incomplete     Total time spend on discharging this patient, including the last patient exam, discussing the hospital stay, instructions for ongoing care as it relates to all pertinent caregivers, as well as preparing the medical discharge records, prescriptions, and/or referrals as applicable, is 45 minutes.    Darlin Priestly, MD  [facility] 01/21/2024, 12:22 PM

## 2024-01-22 LAB — GLUCOSE, CAPILLARY: Glucose-Capillary: 80 mg/dL (ref 70–99)

## 2024-01-24 LAB — AEROBIC/ANAEROBIC CULTURE W GRAM STAIN (SURGICAL/DEEP WOUND)
Culture: NO GROWTH
Gram Stain: NONE SEEN

## 2024-01-26 ENCOUNTER — Telehealth: Payer: Self-pay

## 2024-01-26 NOTE — Telephone Encounter (Signed)
 Received voicemail from patient with concerns of nausea and diarrhea. Called her back to further assess.   Symptoms began last Thursday, she has had two episodes of emesis since then. Last episode was Saturday. Reports she is not feeling nauseous now, but has not yet taken her AM dose of antibiotics.   Reports 5-8 episodes of loose stool a day with the last episode occurring last night. Denies abdominal cramping or bloody stool. Has not tried anything OTC for symptom relief. Appointment with provider on 4/17.  Coalton Arch, BSN, RN

## 2024-01-29 ENCOUNTER — Ambulatory Visit
Admission: RE | Admit: 2024-01-29 | Discharge: 2024-01-29 | Disposition: A | Discharge status: Home / Self Care | Source: Ambulatory Visit | Attending: Neurosurgery | Admitting: Neurosurgery

## 2024-01-29 ENCOUNTER — Ambulatory Visit: Attending: Infectious Diseases | Admitting: Infectious Diseases

## 2024-01-29 ENCOUNTER — Other Ambulatory Visit
Admission: RE | Admit: 2024-01-29 | Discharge: 2024-01-29 | Disposition: A | Discharge status: Home / Self Care | Source: Ambulatory Visit | Attending: Infectious Diseases | Admitting: Infectious Diseases

## 2024-01-29 ENCOUNTER — Ambulatory Visit: Admission: RE | Admit: 2024-01-29 | Source: Ambulatory Visit

## 2024-01-29 VITALS — BP 131/86 | HR 91 | Temp 98.2°F | Ht 60.0 in | Wt 113.0 lb

## 2024-01-29 DIAGNOSIS — M00272 Other streptococcal arthritis, left ankle and foot: Secondary | ICD-10-CM

## 2024-01-29 DIAGNOSIS — M4624 Osteomyelitis of vertebra, thoracic region: Secondary | ICD-10-CM

## 2024-01-29 DIAGNOSIS — M00072 Staphylococcal arthritis, left ankle and foot: Secondary | ICD-10-CM | POA: Diagnosis not present

## 2024-01-29 DIAGNOSIS — M4626 Osteomyelitis of vertebra, lumbar region: Secondary | ICD-10-CM | POA: Diagnosis not present

## 2024-01-29 DIAGNOSIS — Z981 Arthrodesis status: Secondary | ICD-10-CM | POA: Diagnosis not present

## 2024-01-29 DIAGNOSIS — F17290 Nicotine dependence, other tobacco product, uncomplicated: Secondary | ICD-10-CM | POA: Insufficient documentation

## 2024-01-29 DIAGNOSIS — M4646 Discitis, unspecified, lumbar region: Secondary | ICD-10-CM | POA: Insufficient documentation

## 2024-01-29 DIAGNOSIS — A0472 Enterocolitis due to Clostridium difficile, not specified as recurrent: Secondary | ICD-10-CM

## 2024-01-29 DIAGNOSIS — B9562 Methicillin resistant Staphylococcus aureus infection as the cause of diseases classified elsewhere: Secondary | ICD-10-CM | POA: Insufficient documentation

## 2024-01-29 DIAGNOSIS — R197 Diarrhea, unspecified: Secondary | ICD-10-CM | POA: Diagnosis not present

## 2024-01-29 DIAGNOSIS — M4644 Discitis, unspecified, thoracic region: Secondary | ICD-10-CM

## 2024-01-29 DIAGNOSIS — M86172 Other acute osteomyelitis, left ankle and foot: Secondary | ICD-10-CM | POA: Diagnosis not present

## 2024-01-29 DIAGNOSIS — R7881 Bacteremia: Secondary | ICD-10-CM | POA: Diagnosis not present

## 2024-01-29 DIAGNOSIS — Z4789 Encounter for other orthopedic aftercare: Secondary | ICD-10-CM | POA: Diagnosis not present

## 2024-01-29 LAB — COMPREHENSIVE METABOLIC PANEL WITH GFR
ALT: 44 U/L (ref 0–44)
AST: 63 U/L — ABNORMAL HIGH (ref 15–41)
Albumin: 3.8 g/dL (ref 3.5–5.0)
Alkaline Phosphatase: 109 U/L (ref 38–126)
Anion gap: 8 (ref 5–15)
BUN: 15 mg/dL (ref 6–20)
CO2: 24 mmol/L (ref 22–32)
Calcium: 9.6 mg/dL (ref 8.9–10.3)
Chloride: 99 mmol/L (ref 98–111)
Creatinine, Ser: 0.73 mg/dL (ref 0.44–1.00)
GFR, Estimated: >60 mL/min (ref >60)
Glucose, Bld: 104 mg/dL — ABNORMAL HIGH (ref 70–99)
Potassium: 4.2 mmol/L (ref 3.5–5.1)
Sodium: 131 mmol/L — ABNORMAL LOW (ref 135–145)
Total Bilirubin: 0.6 mg/dL (ref 0.0–1.2)
Total Protein: 7.1 g/dL (ref 6.5–8.1)

## 2024-01-29 LAB — CBC WITH DIFFERENTIAL/PLATELET
Abs Immature Granulocytes: 0.02 10*3/uL (ref 0.00–0.07)
Basophils Absolute: 0.1 10*3/uL (ref 0.0–0.1)
Basophils Relative: 1 %
Eosinophils Absolute: 0.4 10*3/uL (ref 0.0–0.5)
Eosinophils Relative: 4 %
HCT: 36.8 % (ref 36.0–46.0)
Hemoglobin: 12.4 g/dL (ref 12.0–15.0)
Immature Granulocytes: 0 %
Lymphocytes Relative: 38 %
Lymphs Abs: 3 10*3/uL (ref 0.7–4.0)
MCH: 29.3 pg (ref 26.0–34.0)
MCHC: 33.7 g/dL (ref 30.0–36.0)
MCV: 87 fL (ref 80.0–100.0)
Monocytes Absolute: 0.4 10*3/uL (ref 0.1–1.0)
Monocytes Relative: 5 %
Neutro Abs: 4.2 10*3/uL (ref 1.7–7.7)
Neutrophils Relative %: 52 %
Platelets: 417 10*3/uL — ABNORMAL HIGH (ref 150–400)
RBC: 4.23 MIL/uL (ref 3.87–5.11)
RDW: 12.1 % (ref 11.5–15.5)
WBC: 8.1 10*3/uL (ref 4.0–10.5)
nRBC: 0 % (ref 0.0–0.2)

## 2024-01-29 LAB — C-REACTIVE PROTEIN: CRP: 0.6 mg/dL (ref <1.0)

## 2024-01-29 LAB — SEDIMENTATION RATE: Sed Rate: 13 mm/h (ref 0–20)

## 2024-01-29 NOTE — Patient Instructions (Signed)
 Today you are here for follow up- you have diarrhea, heartburn and itching with Linezolid. Will stop that , will start Bactrim DS 1 bid- will do labs today- Drink plenty of water, cut down on advil while on Bactrim will need to check your kidney and K in 1 week

## 2024-01-29 NOTE — Progress Notes (Signed)
 NAME: Danielle Harrison  DOB: [birth year]  MRN: [id number]  Date/Time: 01/29/2024 10:36 AM   Subjective:   ?follow up after recent hospitalization 01/15/24-01/21/24- ot here with mother Complicated infectious history  Danielle Harrison is a 48 y.o. Was in the hospital between 10/18/2023 until 11/04/2023 for acute pancreatitis but also developed MRSA bacteremia 5 days into hospital stay ( BC positive on 1/9, 1/11 and 1/12) and T8-T9 thoracic vertebral osteomyelitis/discitis and left great toe septic arthritis for which she underwent Left foot first MTP joint resection including head of the first metatarsal and base of proximal phalanx.  This was done on 10/30/2023.  Culture was positive for MRSA.  TEE was negative on 10/29/23.  Repeat blood culture done on 10/30/2023 was negative for MRSA.  Patient was discharged on daptomycin IV for minimum of 6 weeks.  The daptomycin MIC was susceptible at 0.5 mcg.    She presented to urgent care on 11/16/2023 with muscle spasm in her chest.  No workup was done that day.  She was prescribed prednisone and heating pad and wrapping of the chest wall.   She saw her PCP on 11/21/2023 and was still complaining of pain and she ordered a thoracic spine x-ray which showed progressive disc space narrowing and wedging of T6 and T7 worrisome for discitis and osteomyelitis and pathological compression fracture.   She came back to [facility] ED on 11/23/2023 complaining of worsening pain and it was hurting for her to talk turn twist or move.She had been 100% adherent to Daptomycin . MRI showed epidural abscess and nerve compression after 23 days of IV dapto She underwent surgery on 11/25/23 1. Posterior Segmental Instrumentation T4 to T9 2. Posterolateral arthrodesis from T4 to T9 3. Thoracic lamina to me at T6 and T7 4.  Right sided transpedicular disc debridement and washout of ventral epidural abscess 5.  Autograft harvest spinous processes 6.  Allograft placement Culture was MRSA- sensitive to Dapt  MIC<1 She was this time treated with vancomycin and discharged home on it along with rifampin . There has been some fluctuation in vanco levels- intially low and vanco was increased to Q8. Cr has been steady She had been adherent to vanco Q8 but has not been sticking to the correct time, so the level of vanco has been high I saw her on 12/30/23 and She was doing much better Pain much improved- some present on the mid back The plan was to finish 8 weeks of Iv vanco and Po rifampin on 4/7 and do bactrim and rifampin for 4-6 weeks. 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 IR aspirated the disc space and spinous process and culture was neg for MRSA PT was sent on Po Linezolid and PO rifampin on 4/9 She called after a few days c/o nausea, vomiting diarrhea and heart burn- rifampin was discontinued, but all her symptoms persist No vomiting but she has diarrhea, heartburn and itching No rash Back pain persist. She has been taking 6-8 advil a day She has had lumbar back pain for many  years now and has even receiced epidural infections for lumbar radiculopathy     Past Medical History:  Diagnosis Date   Anxiety    Arthritis    back   Asthma    HTN (hypertension)    Neuromuscular disorder (HCC)    weakness and numbness    Restless leg syndrome     Past Surgical History:  Procedure Laterality Date   APPLICATION OF INTRAOPERATIVE CT SCAN N/A 11/25/2023   Procedure: APPLICATION OF INTRAOPERATIVE CT SCAN;  Surgeon: Carroll Clamp, MD;  Location:  [facility];  Service: Neurosurgery;  Laterality: N/A;   BIOPSY  06/28/2019   Procedure: BIOPSY;  Surgeon: Irby Mannan, MD;  Location: [facility];  Service: Endoscopy;;   CESAREAN SECTION     x2   COLONOSCOPY WITH PROPOFOL N/A 06/28/2019   Procedure: COLONOSCOPY WITH PROPOFOL;  Surgeon: Irby Mannan, MD;  Location: [facility];  Service: Endoscopy;  Laterality: N/A;   ESOPHAGOGASTRODUODENOSCOPY (EGD) WITH PROPOFOL N/A 06/28/2019   Procedure: ESOPHAGOGASTRODUODENOSCOPY (EGD) WITH PROPOFOL;  Surgeon: Irby Mannan, MD;  Location: [facility];  Service: Endoscopy;  Laterality: N/A;   INCISION AND DRAINAGE Left 10/27/2023   Procedure: INCISION AND DRAINAGE LEFT FOOT;  Surgeon: Evertt Hoe, DPM;  Location: [facility];  Service: Orthopedics/Podiatry;  Laterality: Left;   INCISION AND DRAINAGE Left 10/30/2023   Procedure: LEFT FOOT FIRST METATARSAL PHALANGEAL JOINT RESECTION, ANTIBIOTIC SPACER, WASHOUT, WOUND CLOSURE;  Surgeon: Evertt Hoe, DPM;  Location: [facility];  Service: Orthopedics/Podiatry;  Laterality: Left;   IR LUMBAR DISC ASPIRATION W/IMG GUIDE  01/19/2024   TEE WITHOUT CARDIOVERSION N/A 10/29/2023   Procedure: TRANSESOPHAGEAL ECHOCARDIOGRAM (TEE);  Surgeon: Devorah Fonder, MD;  Location: [facility];  Service: Cardiovascular;  Laterality: N/A;   THORACIC LAMINECTOMY FOR EPIDURAL ABSCESS N/A 11/25/2023   Procedure: T6-T7 thoracic laminectomy, right sided transpedicular decompression and disc debridement;  Surgeon: Carroll Clamp, MD;  Location: [facility];  Service: Neurosurgery;  Laterality: N/A;   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   Procedure: LEFT FOOT 1ST MPJ RESECTION;  Surgeon: Evertt Hoe, DPM;  Location: [facility];  Service: Orthopedics/Podiatry;  Laterality: Left;   TRANSMETATARSAL AMPUTATION Left 10/30/2023   Procedure: LEFT FOOT 1ST MPJ RESECTION;  Surgeon: Evertt Hoe, DPM;  Location:  [facility];  Service: Orthopedics/Podiatry;  Laterality: Left;    Social History   Socioeconomic History   Marital status: Married    Spouse name: Not on file   Number of children: Not on file   Years of education: Not on file   Highest education level: Not on file  Occupational History   Not on file  Tobacco Use   Smoking status: Former    Current packs/day: 0.50    Average packs/day: 0.5 packs/day for 6.0 years (3.0 ttl pk-yrs)    Types: Cigarettes   Smokeless tobacco: Never   Tobacco comments:    Quit 8/22  Vaping Use   Vaping status: Every Day   Substances: Nicotine  Substance and Sexual Activity   Alcohol use: Not Currently    Comment: 3 times per week   Drug use: Never   Sexual activity: Not on file  Other Topics Concern   Not on file  Social History Narrative   Not on file   Social Drivers of Health   Financial Resource Strain: Not on file  Food Insecurity: No Food Insecurity (01/15/2024)   Hunger Vital Sign    Worried About Running Out of  Food in the Last Year: Never true    Ran Out of Food in the Last Year: Never true  Transportation Needs: No Transportation Needs (01/15/2024)   PRAPARE - Administrator, Civil Service (Medical): No    Lack of Transportation (Non-Medical): No  Physical Activity: Not on file  Stress: Not on file  Social Connections: Not on file  Intimate Partner Violence: Not At Risk (01/15/2024)   Humiliation, Afraid, Rape, and Kick questionnaire    Fear of Current or Ex-Partner: No    Emotionally Abused: No    Physically Abused: No    Sexually Abused: No    Family History  Problem Relation Age of Onset   Colon cancer Neg Hx    Allergies  Allergen Reactions   Diclofenac Hives   I? Current Outpatient Medications  Medication Sig Dispense Refill   acetaminophen (TYLENOL) 500 MG tablet Take 2 tablets (1,000 mg total) by mouth every 8 (eight) hours as needed. 100 tablet 2   albuterol (VENTOLIN HFA) 108 (90 Base) MCG/ACT inhaler  Inhale 1 puff into the lungs every 6 (six) hours as needed for wheezing or shortness of breath. 8 g 3   budesonide-formoterol (SYMBICORT) 160-4.5 MCG/ACT inhaler Inhale 2 puffs into the lungs 2 (two) times daily. 1 each 3   linezolid (ZYVOX) 600 MG tablet Take 1 tablet (600 mg total) by mouth 2 (two) times daily for 14 days. 28 tablet 0   metFORMIN (GLUCOPHAGE) 500 MG tablet Take 1 tablet (500 mg total) by mouth 2 (two) times daily with a meal. 180 tablet 1   methocarbamol (ROBAXIN) 500 MG tablet Take 1 tablet (500 mg total) by mouth every 6 (six) hours as needed for muscle spasms. 120 tablet 1   metoprolol tartrate (LOPRESSOR) 100 MG tablet Take 1 tablet (100 mg total) by mouth 2 (two) times daily. 180 tablet 1   polyethylene glycol powder (GLYCOLAX/MIRALAX) 17 GM/SCOOP powder Take 17 g by mouth daily. Mix as directed. 238 g 0   rifampin (RIFADIN) 300 MG capsule Take 1 capsule (300 mg total) by mouth 2 (two) times daily for 14 days. (Patient not taking: Reported on 01/29/2024) 28 capsule 0   No current facility-administered medications for this visit.     Abtx:  Anti-infectives (From admission, onward)    None       REVIEW OF SYSTEMS:  Const: negative fever, negative chills, negative weight loss Eyes: negative diplopia or visual changes, negative eye pain ENT: negative coryza, negative sore throat Resp: negative cough, hemoptysis, dyspnea Cards: negative for chest pain, palpitations, lower extremity edema GU: negative for frequency, dysuria and hematuria GI: Negative for abdominal pain, diarrhea, bleeding, constipation Skin: negative for rash and pruritus Heme: negative for easy bruising and gum/nose bleeding MS: back pain  Neurolo:negative for headaches, dizziness, vertigo, memory problems  Psych: negative for feelings of anxiety, depression  Endocrine: negative for thyroid, diabetes Allergy/Immunology- diclofenac Objective:  VITALS:  BP 131/86   Pulse 91   Temp 98.2 F (36.8  C) (Temporal)   Ht 5' (1.524 m)   Wt 113 lb (51.3 kg)   LMP 06/01/2022 (Exact Date)   SpO2 98%   BMI 22.07 kg/m   LDA PICC PHYSICAL EXAM:  General: Alert, cooperative, no distress, appears stated age.  Head: Normocephalic, without obvious abnormality, atraumatic. Eyes: Conjunctivae clear, anicteric sclerae. Pupils are equal ENT Nares normal. No drainage or sinus tenderness. Lips, mucosa, and tongue normal. No Thrush Neck: Supple, symmetrical, no adenopathy, thyroid: non  tender no carotid bruit and no JVD. Back: thoracic scar healed well Lungs: Clear to auscultation bilaterally. No Wheezing or Rhonchi. No rales. Heart: Regular rate and rhythm, no murmur, rub or gallop. Abdomen: Soft, non-tender,not distended. Bowel sounds normal. No masses Extremities: atraumatic, no cyanosis. No edema. No clubbing Skin: No rashes or lesions. Or bruising Lymph: Cervical, supraclavicular normal. Neurologic: Grossly non-focal Pertinent Labs Will do labs today   ? Impression/Recommendation ? ?MRSA bacteremia in Jan 2025 with T6-T7 discitis  and left great toe septic arthritis and osteo Shewas  on appropriate antibiotic after  23 days of Iv daptomycin there was progression of the vertebral osteomyelitis  with epidural abscess and nerve compression causing severe pain. She underwent surgery on 11/25/23 1. Posterior Segmental Instrumentation T4 to T9 2. Posterolateral arthrodesis from T4 to T9 3. Thoracic lamina to me at T6 and T7 4.  Right sided transpedicular disc debridement and washout of ventral epidural abscess 5.  Autograft harvest spinous processes 6.  Allograft placement  culture 11/25/23 was MRSA ( susceptible to vanco and dapto)  She was started on vancomycin And also on rifampin  Sed rate /crp normalized While on the 8th week of Antibiotic she was readmitted for lumbar pain  and MRI showed L1-L2 discitis and osteo and L3 spinous process involvement She underwent aspiration from the  disc space and spinous process by IR  on 01/19/24 and the culture combined was no growth She was sent home on 4/9 on linezolid and rifampin Rifampin on hold 4/15 As her symptoms of diarrhea, and itching and nausea peririst will stop linezlid do labs today and start Bactrim  Will add rifampin if she tolerates bactrim While on the antibitoc need to check K and cr in 1 week Heart burn likely due to advil ( taking 8/day) Told her to stop /use voltaren gel Follow up with painmanagement   She has baseline lumbar deg disc disease and is followed by pain management at [place]      Left great toe infection with septic arthritis and osteo of the Met- s/p resection- site has healed well  H/o pancreatitis   Will do labs today Follow up 2 weeks Spoke to patient and her mom  P.s  Latest Reference Range & Units 01/29/24 11:58  WBC 4.0 - 10.5 K/uL 8.1  RBC 3.87 - 5.11 MIL/uL 4.23  Hemoglobin 12.0 - 15.0 g/dL 72.5  HCT 36.6 - 44.0 % 36.8  MCV 80.0 - 100.0 fL 87.0  MCH 26.0 - 34.0 pg 29.3  MCHC 30.0 - 36.0 g/dL 34.7  RDW 42.5 - 95.6 % 12.1  Platelets 150 - 400 K/uL 417 (H)  nRBC 0.0 - 0.2 % 0.0    Latest Reference Range & Units 01/29/24 11:58  Sodium 135 - 145 mmol/L 131 (L)  Potassium 3.5 - 5.1 mmol/L 4.2  Chloride 98 - 111 mmol/L 99  CO2 22 - 32 mmol/L 24  Glucose 70 - 99 mg/dL 387 (H)  BUN 6 - 20 mg/dL 15  Creatinine 5.64 - 3.32 mg/dL 9.51  Calcium 8.9 - 88.4 mg/dL 9.6  Anion gap 5 - 15  8  Alkaline Phosphatase 38 - 126 U/L 109  Albumin 3.5 - 5.0 g/dL 3.8  AST 15 - 41 U/L 63 (H)  ALT 0 - 44 U/L 44  Total Protein 6.5 - 8.1 g/dL 7.1    Sed rate  ?

## 2024-02-03 ENCOUNTER — Telehealth: Payer: Self-pay

## 2024-02-03 NOTE — Telephone Encounter (Signed)
-----   Message from Alica Inks sent at 02/03/2024 12:59 PM EDT ----- Can you find out howshe is doing on bactrim ? Has the diarrhea, nausea and vomiting resolved since she stopped linezolid - IS the itching gone?  IS she back on rifampin ? Her inflammatory markers SED rate and CRP are normal. thx ----- Message ----- From: Interface, Lab In [place] Sent: 01/29/2024  12:19 PM EDT To: Alica Inks, MD

## 2024-02-03 NOTE — Telephone Encounter (Signed)
 I attempted to reach out to the patient. The patient did not answer and No VM setup.  Aleah Ahlgrim Adel Holt, CMA

## 2024-02-06 ENCOUNTER — Other Ambulatory Visit
Admission: RE | Admit: 2024-02-06 | Discharge: 2024-02-06 | Disposition: A | Discharge status: Home / Self Care | Source: Ambulatory Visit | Attending: Infectious Diseases | Admitting: Infectious Diseases

## 2024-02-06 ENCOUNTER — Telehealth: Admitting: Family Medicine

## 2024-02-06 DIAGNOSIS — R197 Diarrhea, unspecified: Secondary | ICD-10-CM | POA: Diagnosis not present

## 2024-02-06 DIAGNOSIS — M549 Dorsalgia, unspecified: Secondary | ICD-10-CM

## 2024-02-06 LAB — C DIFFICILE QUICK SCREEN W PCR REFLEX
C Diff antigen: NEGATIVE
C Diff interpretation: NOT DETECTED
C Diff toxin: NEGATIVE

## 2024-02-06 NOTE — Progress Notes (Signed)
  Because Ms. Hardiman, I feel your condition warrants further evaluation and I recommend that you be seen in a face-to-face visit.   NOTE: There will be NO CHARGE for this E-Visit   If you are having a true medical emergency, please call 911.     For an urgent face to face visit, [facility] has multiple urgent care centers for your convenience.  Click the link below for the full list of locations and hours, walk-in wait times, appointment scheduling options and driving directions:  Urgent Care - [place], [place], [place], [place], [place], [place]  [place]     Your MyChart E-visit questionnaire answers were reviewed by a board certified advanced clinical practitioner to complete your personal care plan based on your specific symptoms.    Thank you for using e-Visits.   have provided 5 minutes of non face to face time during this encounter for chart review and documentation.

## 2024-02-11 ENCOUNTER — Ambulatory Visit (INDEPENDENT_AMBULATORY_CARE_PROVIDER_SITE_OTHER): Payer: Medicaid Other | Admitting: Physician Assistant

## 2024-02-11 ENCOUNTER — Encounter: Payer: Self-pay | Admitting: Physician Assistant

## 2024-02-11 VITALS — BP 104/76 | Ht 60.0 in | Wt 113.0 lb

## 2024-02-11 DIAGNOSIS — M4624 Osteomyelitis of vertebra, thoracic region: Secondary | ICD-10-CM

## 2024-02-11 DIAGNOSIS — Z9889 Other specified postprocedural states: Secondary | ICD-10-CM

## 2024-02-11 DIAGNOSIS — Z981 Arthrodesis status: Secondary | ICD-10-CM

## 2024-02-11 MED ORDER — GABAPENTIN 300 MG PO CAPS: 300.0000 mg | ORAL_CAPSULE | Freq: Three times a day (TID) | ORAL | 2 refills | Status: DC

## 2024-02-11 NOTE — Progress Notes (Signed)
   REFERRING PHYSICIAN:  [name], [address] [place],  [place] [address]  DOS: 11/25/23 T6-7 laminectomy, right transpedicular decompression and disc debridement, T4-T10 posterior spinal fusion.  She was then readmitted to the hospital recently approximately 3 weeks ago  HISTORY OF PRESENT ILLNESS: Danielle Harrison is approximately 10 weeks status post thoracic decompression and fusion.  She was then readmitted to the hospital approximately 3 weeks ago with severe low back pain.  MRI showed L1-2 discitis osteomyelitis and L2-3 spinous process osteomyelitis which was new compared to her MRI in February.  There was not IR aspiration of the disc space that was negative for MRSA on culture.  She continues to be followed by infectious disease and has been continuing on antibiotic therapy.  She continues to be in quite a bit of pain in her mid and low back although improved compared to when she was in the hospital.  She describes it as a burning pain, primarily on the right side of her incision that is constant in nature.  She also continues to have numbness primarily in her toes.  No new weakness or changes to bowel and bladder function.     PHYSICAL EXAMINATION:  General: Patient is well developed, well nourished, calm, collected, and in no apparent distress.   NEUROLOGICAL:  General: In no acute distress.   Awake, alert, oriented to person, place, and time.  Pupils equal round and reactive to light.  Facial tone is symmetric.   Patient is tender to palpation of her thoracic and lumbar spine.   Strength:            Side Iliopsoas Quads Hamstring PF DF EHL  R 5 5 5 5 5 5   L 5 5 5 5 5 5   Incision is completely healed.   ROS (Neurologic):  Negative except as noted above  IMAGING: IMPRESSION: 1. Positive for L1-L2 Discitis Osteomyelitis, and L2-L3 spinous process Osteomyelitis, which are new since the Lumbar MRI on 10/23/2023. 2. Negative for epidural or paraspinal  abscess. 3. 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  ASSESSMENT/PLAN:  Danielle Harrison is approximately 10 weeks status post thoracic decompression and fusion.  She was then readmitted to the hospital approximately 3 weeks ago with severe low back pain.  MRI showed L1-2 discitis osteomyelitis and L2-3 spinous process osteomyelitis which was new compared to her MRI in February.  There was not IR aspiration of the disc space that was negative for MRSA on culture.  She continues to be followed by infectious disease and has been continuing on antibiotic therapy.  She continues to be in quite a bit of pain in her mid and low back although improved compared to when she was in the hospital.  She describes it as a burning pain, primarily on the right side of her incision that is constant in nature.  She also continues to have numbness primarily in her toes.  No new weakness or changes to bowel and bladder function.   Plan:  - Advised patient to continue with primary team, infectious disease.  -Additional imaging may be warranted in the future but will defer to infectious disease team if they would like updated MRI of her spine in the near future. Discussed this with Dr. Felipe Horton - See back in 6 to 8 weeks    Ludwig Safer PA-C Department of neurosurgery

## 2024-02-12 ENCOUNTER — Ambulatory Visit: Attending: Infectious Diseases | Admitting: Infectious Diseases

## 2024-02-12 ENCOUNTER — Other Ambulatory Visit: Payer: Self-pay | Admitting: Physician Assistant

## 2024-02-12 ENCOUNTER — Other Ambulatory Visit
Admission: RE | Admit: 2024-02-12 | Discharge: 2024-02-12 | Disposition: A | Discharge status: Home / Self Care | Source: Ambulatory Visit | Attending: Infectious Diseases | Admitting: Infectious Diseases

## 2024-02-12 ENCOUNTER — Encounter: Payer: Self-pay | Admitting: Infectious Diseases

## 2024-02-12 VITALS — BP 109/73 | HR 84 | Temp 98.2°F | Ht 60.0 in | Wt 112.0 lb

## 2024-02-12 DIAGNOSIS — B9562 Methicillin resistant Staphylococcus aureus infection as the cause of diseases classified elsewhere: Secondary | ICD-10-CM | POA: Diagnosis not present

## 2024-02-12 DIAGNOSIS — M4646 Discitis, unspecified, lumbar region: Secondary | ICD-10-CM | POA: Diagnosis not present

## 2024-02-12 DIAGNOSIS — E119 Type 2 diabetes mellitus without complications: Secondary | ICD-10-CM | POA: Insufficient documentation

## 2024-02-12 DIAGNOSIS — A4902 Methicillin resistant Staphylococcus aureus infection, unspecified site: Secondary | ICD-10-CM | POA: Diagnosis not present

## 2024-02-12 DIAGNOSIS — M4644 Discitis, unspecified, thoracic region: Secondary | ICD-10-CM | POA: Insufficient documentation

## 2024-02-12 DIAGNOSIS — M86172 Other acute osteomyelitis, left ankle and foot: Secondary | ICD-10-CM | POA: Diagnosis not present

## 2024-02-12 DIAGNOSIS — Z87891 Personal history of nicotine dependence: Secondary | ICD-10-CM | POA: Insufficient documentation

## 2024-02-12 DIAGNOSIS — M00872 Arthritis due to other bacteria, left ankle and foot: Secondary | ICD-10-CM

## 2024-02-12 DIAGNOSIS — Z7984 Long term (current) use of oral hypoglycemic drugs: Secondary | ICD-10-CM | POA: Insufficient documentation

## 2024-02-12 DIAGNOSIS — R7881 Bacteremia: Secondary | ICD-10-CM | POA: Diagnosis not present

## 2024-02-12 DIAGNOSIS — Z89432 Acquired absence of left foot: Secondary | ICD-10-CM | POA: Insufficient documentation

## 2024-02-12 DIAGNOSIS — M4624 Osteomyelitis of vertebra, thoracic region: Secondary | ICD-10-CM

## 2024-02-12 DIAGNOSIS — M4626 Osteomyelitis of vertebra, lumbar region: Secondary | ICD-10-CM

## 2024-02-12 LAB — CBC WITH DIFFERENTIAL/PLATELET
Abs Immature Granulocytes: 0.01 10*3/uL (ref 0.00–0.07)
Basophils Absolute: 0 10*3/uL (ref 0.0–0.1)
Basophils Relative: 1 %
Eosinophils Absolute: 0.2 10*3/uL (ref 0.0–0.5)
Eosinophils Relative: 3 %
HCT: 36.8 % (ref 36.0–46.0)
Hemoglobin: 12.5 g/dL (ref 12.0–15.0)
Immature Granulocytes: 0 %
Lymphocytes Relative: 47 %
Lymphs Abs: 2.5 10*3/uL (ref 0.7–4.0)
MCH: 29.3 pg (ref 26.0–34.0)
MCHC: 34 g/dL (ref 30.0–36.0)
MCV: 86.4 fL (ref 80.0–100.0)
Monocytes Absolute: 0.5 10*3/uL (ref 0.1–1.0)
Monocytes Relative: 9 %
Neutro Abs: 2.1 10*3/uL (ref 1.7–7.7)
Neutrophils Relative %: 40 %
Platelets: 377 10*3/uL (ref 150–400)
RBC: 4.26 MIL/uL (ref 3.87–5.11)
RDW: 12.9 % (ref 11.5–15.5)
WBC: 5.3 10*3/uL (ref 4.0–10.5)
nRBC: 0 % (ref 0.0–0.2)

## 2024-02-12 LAB — COMPREHENSIVE METABOLIC PANEL WITH GFR
ALT: 12 U/L (ref 0–44)
AST: 17 U/L (ref 15–41)
Albumin: 4.2 g/dL (ref 3.5–5.0)
Alkaline Phosphatase: 112 U/L (ref 38–126)
Anion gap: 6 (ref 5–15)
BUN: 22 mg/dL — ABNORMAL HIGH (ref 6–20)
CO2: 18 mmol/L — ABNORMAL LOW (ref 22–32)
Calcium: 9.2 mg/dL (ref 8.9–10.3)
Chloride: 111 mmol/L (ref 98–111)
Creatinine, Ser: 0.79 mg/dL (ref 0.44–1.00)
GFR, Estimated: >60 mL/min (ref >60)
Glucose, Bld: 164 mg/dL — ABNORMAL HIGH (ref 70–99)
Potassium: 4.8 mmol/L (ref 3.5–5.1)
Sodium: 135 mmol/L (ref 135–145)
Total Bilirubin: 0.4 mg/dL (ref 0.0–1.2)
Total Protein: 7.7 g/dL (ref 6.5–8.1)

## 2024-02-12 LAB — C-REACTIVE PROTEIN: CRP: 0.8 mg/dL (ref <1.0)

## 2024-02-12 LAB — SEDIMENTATION RATE: Sed Rate: 9 mm/h (ref 0–20)

## 2024-02-12 MED ORDER — SULFAMETHOXAZOLE-TRIMETHOPRIM 800-160 MG PO TABS: 1.0000 | ORAL_TABLET | Freq: Two times a day (BID) | ORAL | 2 refills | Status: DC

## 2024-02-12 NOTE — Progress Notes (Signed)
 NAME: Danielle Harrison  DOB: 1976/03/02  MRN: [id number]  Date/Time: 02/12/2024 11:09 AM   Subjective:   ?follow up visit for MRSA infection Doing well on bactrim   Complicated infectious history  Danielle Harrison is a 48 y.o. Was in the hospital between 10/18/2023 until 11/04/2023 for acute pancreatitis but also developed MRSA bacteremia 5 days into hospital stay ( BC positive on 1/9, 1/11 and 1/12) and T8-T9 thoracic vertebral osteomyelitis/discitis and left great toe septic arthritis for which she underwent Left foot first MTP joint resection including head of the first metatarsal and base of proximal phalanx.  This was done on 10/30/2023.  Culture was positive for MRSA.  TEE was negative on 10/29/23.  Repeat blood culture done on 10/30/2023 was negative for MRSA.  Patient was discharged on daptomycin  IV for minimum of 6 weeks.  The daptomycin  MIC was susceptible at 0.5 mcg.    She presented to urgent care on 11/16/2023 with muscle spasm in her chest.  No workup was done that day.  She was prescribed prednisone  and heating pad and wrapping of the chest wall.   She saw her PCP on 11/21/2023 and was still complaining of pain and she ordered a thoracic spine x-ray which showed progressive disc space narrowing and wedging of T6 and T7 worrisome for discitis and osteomyelitis and pathological compression fracture.   She came back to [facility] ED on 11/23/2023 complaining of worsening pain and it was hurting for her to talk turn twist or move.She had been 100% adherent to Daptomycin  . MRI showed epidural abscess and nerve compression after 23 days of IV dapto She underwent surgery on 11/25/23 1. Posterior Segmental Instrumentation T4 to T9 2. Posterolateral arthrodesis from T4 to T9 3. Thoracic lamina to me at T6 and T7 4.  Right sided transpedicular disc debridement and washout of ventral epidural abscess 5.  Autograft harvest spinous processes 6.  Allograft placement Culture was MRSA- sensitive to Dapt MIC<1 She was  this time treated with vancomycin  and discharged home on it along with rifampin  . There has been some fluctuation in vanco levels- intially low and vanco was increased to Q8. Cr has been steady She had been adherent to vanco Q8 but has not been sticking to the correct time, so the level of vanco has been high I saw her on 12/30/23 and She was doing much better Pain much improved- some present on the mid back The plan was to finish 8 weeks of Iv vanco and Po rifampin  on 4/7 and do bactrim  and rifampin  for 4-6 weeks. 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 IR aspirated the disc space and spinous process and culture was neg for MRSA PT was sent on Po Linezolid  and PO rifampin  on 4/9 She called after a few days c/o nausea, vomiting diarrhea and heart burn- rifampin  was discontinued, and so was t=rifampin  and started on Bactrim  She is doing very well Pain much better Saw neurosurgery yesterday and they are planning repeat MRI      Past Medical History:  Diagnosis Date   Anxiety    Arthritis    back   Asthma    HTN (hypertension)    Neuromuscular disorder (HCC)    weakness and numbness    Restless leg syndrome     Past Surgical History:  Procedure Laterality Date   APPLICATION OF INTRAOPERATIVE CT SCAN N/A 11/25/2023   Procedure: APPLICATION OF INTRAOPERATIVE CT SCAN;  Surgeon: Carroll Clamp, MD;  Location: [facility];  Service: Neurosurgery;  Laterality: N/A;   BIOPSY  06/28/2019   Procedure: BIOPSY;  Surgeon: Irby Mannan, MD;   Location: [facility];  Service: Endoscopy;;   CESAREAN SECTION     x2   COLONOSCOPY WITH PROPOFOL  N/A 06/28/2019   Procedure: COLONOSCOPY WITH PROPOFOL ;  Surgeon: Irby Mannan, MD;  Location: [facility];  Service: Endoscopy;  Laterality: N/A;   ESOPHAGOGASTRODUODENOSCOPY (EGD) WITH PROPOFOL  N/A 06/28/2019   Procedure: ESOPHAGOGASTRODUODENOSCOPY (EGD) WITH PROPOFOL ;  Surgeon: Irby Mannan, MD;  Location: [facility];  Service: Endoscopy;  Laterality: N/A;   INCISION AND DRAINAGE Left 10/27/2023   Procedure: INCISION AND DRAINAGE LEFT FOOT;  Surgeon: Evertt Hoe, DPM;  Location: [facility];  Service: Orthopedics/Podiatry;  Laterality: Left;   INCISION AND DRAINAGE Left 10/30/2023   Procedure: LEFT FOOT FIRST METATARSAL PHALANGEAL JOINT RESECTION, ANTIBIOTIC SPACER, WASHOUT, WOUND CLOSURE;  Surgeon: Evertt Hoe, DPM;  Location: [facility];  Service: Orthopedics/Podiatry;  Laterality: Left;   IR LUMBAR DISC ASPIRATION W/IMG GUIDE  01/19/2024   TEE WITHOUT CARDIOVERSION N/A 10/29/2023   Procedure: TRANSESOPHAGEAL ECHOCARDIOGRAM (TEE);  Surgeon: Devorah Fonder, MD;  Location: [facility];  Service: Cardiovascular;  Laterality: N/A;   THORACIC LAMINECTOMY FOR EPIDURAL ABSCESS N/A 11/25/2023   Procedure: T6-T7 thoracic laminectomy, right sided transpedicular decompression and disc debridement;  Surgeon: Carroll Clamp, MD;  Location: [facility];  Service: Neurosurgery;  Laterality: N/A;   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   Procedure: LEFT FOOT 1ST MPJ RESECTION;  Surgeon: Evertt Hoe, DPM;  Location: [facility];  Service: Orthopedics/Podiatry;  Laterality: Left;   TRANSMETATARSAL AMPUTATION Left 10/30/2023   Procedure: LEFT FOOT 1ST MPJ RESECTION;  Surgeon: Evertt Hoe, DPM;  Location: [facility];  Service: Orthopedics/Podiatry;  Laterality: Left;    Social History   Socioeconomic History   Marital status: Married     Spouse name: Not on file   Number of children: Not on file   Years of education: Not on file   Highest education level: Not on file  Occupational History   Not on file  Tobacco Use   Smoking status: Former    Current packs/day: 0.50    Average packs/day: 0.5 packs/day for 6.0 years (3.0 ttl pk-yrs)    Types: Cigarettes   Smokeless tobacco: Never   Tobacco comments:    Quit 8/22  Vaping Use   Vaping status: Every Day   Substances: Nicotine  Substance and Sexual Activity   Alcohol use: Not Currently    Comment: 3 times per week   Drug use: Never   Sexual activity: Not on file  Other Topics Concern   Not on file  Social History Narrative   Not on file   Social Drivers of Health   Financial Resource Strain: Not on file  Food Insecurity: No Food Insecurity (01/15/2024)   Hunger Vital Sign    Worried About Running Out of Food in the Last Year: Never true    Ran Out of Food in the Last Year: Never  true  Transportation Needs: No Transportation Needs (01/15/2024)   PRAPARE - Administrator, Civil Service (Medical): No    Lack of Transportation (Non-Medical): No  Physical Activity: Not on file  Stress: Not on file  Social Connections: Not on file  Intimate Partner Violence: Not At Risk (01/15/2024)   Humiliation, Afraid, Rape, and Kick questionnaire    Fear of Current or Ex-Partner: No    Emotionally Abused: No    Physically Abused: No    Sexually Abused: No    Family History  Problem Relation Age of Onset   Colon cancer Neg Hx    Allergies  Allergen Reactions   Diclofenac Hives   I? Current Outpatient Medications  Medication Sig Dispense Refill   acetaminophen  (TYLENOL ) 500 MG tablet Take 2 tablets (1,000 mg total) by mouth every 8 (eight) hours as needed. 100 tablet 2   albuterol  (VENTOLIN  HFA) 108 (90 Base) MCG/ACT inhaler Inhale 1 puff into the lungs every 6 (six) hours as needed for wheezing or shortness of breath. 8 g 3   budesonide -formoterol   (SYMBICORT ) 160-4.5 MCG/ACT inhaler Inhale 2 puffs into the lungs 2 (two) times daily. 1 each 3   gabapentin  (NEURONTIN ) 300 MG capsule Take 1 capsule (300 mg total) by mouth 3 (three) times daily. 90 capsule 2   metFORMIN  (GLUCOPHAGE ) 500 MG tablet Take 1 tablet (500 mg total) by mouth 2 (two) times daily with a meal. 180 tablet 1   methocarbamol  (ROBAXIN ) 500 MG tablet Take 1 tablet (500 mg total) by mouth every 6 (six) hours as needed for muscle spasms. 120 tablet 1   metoprolol  tartrate (LOPRESSOR ) 100 MG tablet Take 1 tablet (100 mg total) by mouth 2 (two) times daily. 180 tablet 1   polyethylene glycol powder (GLYCOLAX /MIRALAX ) 17 GM/SCOOP powder Take 17 g by mouth daily. Mix as directed. 238 g 0   No current facility-administered medications for this visit.     Abtx:  Anti-infectives (From admission, onward)    None       REVIEW OF SYSTEMS:  Const: negative fever, negative chills, negative weight loss Eyes: negative diplopia or visual changes, negative eye pain ENT: negative coryza, negative sore throat Resp: negative cough, hemoptysis, dyspnea Cards: negative for chest pain, palpitations, lower extremity edema GU: negative for frequency, dysuria and hematuria GI: Negative for abdominal pain, diarrhea, bleeding, constipation Skin: negative for rash and pruritus Heme: negative for easy bruising and gum/nose bleeding MS: back pain  Neurolo:negative for headaches, dizziness, vertigo, memory problems  Psych: negative for feelings of anxiety, depression  Endocrine: negative for thyroid, diabetes Allergy/Immunology- diclofenac Objective:  VITALS:  BP 109/73   Pulse 84   Temp 98.2 F (36.8 C) (Temporal)   Ht 5' (1.524 m)   Wt 112 lb (50.8 kg)   LMP 06/01/2022 (Exact Date)   SpO2 98%   BMI 21.87 kg/m   LDA PICC PHYSICAL EXAM:  General: Alert, cooperative, no distress, appears stated age.  Head: Normocephalic, without obvious abnormality, atraumatic. Eyes: Conjunctivae  clear, anicteric sclerae. Pupils are equal ENT Nares normal. No drainage or sinus tenderness. Lips, mucosa, and tongue normal. No Thrush Neck: Supple, symmetrical, no adenopathy, thyroid: non tender no carotid bruit and no JVD. Back: thoracic scar healed well Lungs: Clear to auscultation bilaterally. No Wheezing or Rhonchi. No rales. Heart: Regular rate and rhythm, no murmur, rub or gallop. Abdomen: Soft, non-tender,not distended. Bowel sounds normal. No masses Extremities: atraumatic, no cyanosis. No edema. No clubbing Skin: No rashes  or lesions. Or bruising Lymph: Cervical, supraclavicular normal. Neurologic: Grossly non-focal Pertinent Labs Will do labs today   ? Impression/Recommendation ? ?MRSA bacteremia in Jan 2025 with T6-T7 discitis  and left great toe septic arthritis and osteo Shewas  on appropriate antibiotic after  23 days of Iv daptomycin  there was progression of the vertebral osteomyelitis  with epidural abscess and nerve compression causing severe pain. She underwent surgery on 11/25/23 1. Posterior Segmental Instrumentation T4 to T9 2. Posterolateral arthrodesis from T4 to T9 3. Thoracic lamina to me at T6 and T7 4.  Right sided transpedicular disc debridement and washout of ventral epidural abscess 5.  Autograft harvest spinous processes 6.  Allograft placement  culture 11/25/23 was MRSA ( susceptible to vanco and dapto)  She was started on vancomycin  And  rifampin   Sed rate /crp normalized While on the 8th week of Antibiotic she was readmitted for lumbar pain  and MRI showed L1-L2 discitis and osteo and L3 spinous process involvement She underwent aspiration from the disc space and spinous process by IR  on 01/19/24 and the culture combined was no growth She was sent home on 4/9 on linezolid  and rifampin  Both the antibotics stopped due to side effects'been on bactrim  and doing well Plan is to give 12 - 16 weeks of bactrim - will repeat MRI and may do indefinite  bactrim     She has baseline lumbar deg disc disease and is followed by pain management at [place]      Left great toe infection with septic arthritis and osteo of the Met- s/p resection- site has healed well Bony enlargement will refer to podiatrist   H/o pancreatitis  DM on metformin    Will do labs today Follow up after MRI at the end of this month  Discussed the management with the patient

## 2024-02-12 NOTE — Patient Instructions (Signed)
 VISIT SUMMARY:  During today's visit, we reviewed your ongoing treatment for the spine vertebral infection due to MRSA, your left great toe issues, and your management of type 2 diabetes and hypertension. You reported significant improvement in your back pain with the current medication regimen, although some discomfort persists. We also discussed the discomfort in your left great toe following surgery and your current medication regimen for diabetes and hypertension.  YOUR PLAN:  -VERTEBRAL OSTEOMYELITIS WITH MRSA: Chronic vertebral osteomyelitis is a long-term bone infection in your spine caused by MRSA. You will continue taking Bactrim  twice daily for at least 12 weeks. We will schedule a repeat MRI of your lumbar spine by mid-May or June to check your progress and perform blood work to monitor inflammation. We will also coordinate with neurosurgery for the MRI scheduling.  -MRSA INFECTION IN LEFT GREAT TOE: The MRSA infection in your left great toe was treated with surgery and an antibiotic cement spacer. You are experiencing ongoing pain and difficulty wearing shoes. We will follow up with a foot specialist for further evaluation and consider an x-ray if your symptoms persist.  -TYPE 2 DIABETES MELLITUS: Type 2 diabetes is a condition where your body does not use insulin  properly, leading to high blood sugar levels. Your diabetes is managed with metformin .  -HYPERTENSION: Hypertension is high blood pressure. Your condition is managed with metoprolol .  INSTRUCTIONS:  Please continue taking Bactrim  as prescribed. We will schedule a repeat MRI of your lumbar spine by mid-May or June and perform blood work to monitor your inflammatory markers. Follow up with a foot specialist for your left great toe issues, and consider an x-ray if symptoms persist. Continue managing your diabetes with metformin  and your hypertension with metoprolol . If you experience any new symptoms or have concerns, please  contact our office.

## 2024-02-13 ENCOUNTER — Encounter: Payer: Self-pay | Admitting: Neurosurgery

## 2024-02-13 ENCOUNTER — Telehealth: Payer: Self-pay

## 2024-02-13 NOTE — Telephone Encounter (Signed)
-----   Message from Alica Inks sent at 02/12/2024 10:23 PM EDT ----- Please let her know other than blood sugar which is high, all other tests are essentially normal- continue bactrim  as planned ----- Message ----- From: Interface, Lab In [place] Sent: 02/12/2024  12:04 PM EDT To: Alica Inks, MD

## 2024-02-19 ENCOUNTER — Ambulatory Visit (INDEPENDENT_AMBULATORY_CARE_PROVIDER_SITE_OTHER): Admitting: Podiatry

## 2024-02-19 ENCOUNTER — Ambulatory Visit (INDEPENDENT_AMBULATORY_CARE_PROVIDER_SITE_OTHER)

## 2024-02-19 DIAGNOSIS — M778 Other enthesopathies, not elsewhere classified: Secondary | ICD-10-CM | POA: Diagnosis not present

## 2024-02-19 DIAGNOSIS — M7752 Other enthesopathy of left foot: Secondary | ICD-10-CM

## 2024-02-19 DIAGNOSIS — M86072 Acute hematogenous osteomyelitis, left ankle and foot: Secondary | ICD-10-CM

## 2024-02-19 NOTE — Progress Notes (Signed)
 Subjective:  Patient ID: Danielle Harrison, female    DOB: 09/29/1976,  MRN: [id number]  Chief Complaint  Patient presents with   Post-op Follow-up    Doing okay up until recently. Now that she is on her feet more there is an increase in swelling and pain. Has her friend, scott, with her today.     DOS: 10/30/2023 Procedure: Left first metatarsophalangeal joint resection with antibiotic spacer and wound closure  48 y.o. female returns for post-op check.  Patient presents for pain she recently started getting more swelling and pain about the first MPJ.  She is having difficulty wearing shoes has been doing a lot more walking recently which she thinks is flared up her pain.  Has been seeing infectious disease who wanted her to come back to see if anything need to be done in regards to the spacer that is in place.  Still on antibiotics.  Review of Systems: Negative except as noted in the HPI. Denies N/V/F/Ch.  Past Medical History:  Diagnosis Date   Anxiety    Arthritis    back   Asthma    HTN (hypertension)    Neuromuscular disorder (HCC)    weakness and numbness    Restless leg syndrome     Current Outpatient Medications:    acetaminophen  (TYLENOL ) 500 MG tablet, Take 2 tablets (1,000 mg total) by mouth every 8 (eight) hours as needed., Disp: 100 tablet, Rfl: 2   albuterol  (VENTOLIN  HFA) 108 (90 Base) MCG/ACT inhaler, Inhale 1 puff into the lungs every 6 (six) hours as needed for wheezing or shortness of breath., Disp: 8 g, Rfl: 3   budesonide -formoterol  (SYMBICORT ) 160-4.5 MCG/ACT inhaler, Inhale 2 puffs into the lungs 2 (two) times daily., Disp: 1 each, Rfl: 3   gabapentin  (NEURONTIN ) 300 MG capsule, Take 1 capsule (300 mg total) by mouth 3 (three) times daily., Disp: 90 capsule, Rfl: 2   metFORMIN  (GLUCOPHAGE ) 500 MG tablet, Take 1 tablet (500 mg total) by mouth 2 (two) times daily with a meal., Disp: 180 tablet, Rfl: 1   methocarbamol  (ROBAXIN ) 500 MG tablet, Take 1 tablet (500 mg  total) by mouth every 6 (six) hours as needed for muscle spasms., Disp: 120 tablet, Rfl: 1   metoprolol  tartrate (LOPRESSOR ) 100 MG tablet, Take 1 tablet (100 mg total) by mouth 2 (two) times daily., Disp: 180 tablet, Rfl: 1   polyethylene glycol powder (GLYCOLAX /MIRALAX ) 17 GM/SCOOP powder, Take 17 g by mouth daily. Mix as directed., Disp: 238 g, Rfl: 0   sulfamethoxazole -trimethoprim  (BACTRIM  DS) 800-160 MG tablet, Take 1 tablet by mouth 2 (two) times daily., Disp: 60 tablet, Rfl: 2  Social History   Tobacco Use  Smoking Status Former   Current packs/day: 0.50   Average packs/day: 0.5 packs/day for 6.0 years (3.0 ttl pk-yrs)   Types: Cigarettes  Smokeless Tobacco Never  Tobacco Comments   Quit 8/22    Allergies  Allergen Reactions   Diclofenac Hives   Objective:  There were no vitals filed for this visit. There is no height or weight on file to calculate BMI. Constitutional Well developed. Well nourished.  Vascular Foot warm and well perfused. Capillary refill normal to all digits.   Neurologic Normal speech. Oriented to person, place, and time. Epicritic sensation to light touch grossly present bilaterally.  Dermatologic Skin completely epithelialized no signs of Deis is noted no complication noted.  No open wound  Orthopedic: Tenderness about the first MPJ with significant edema as well as dorsal  and lateral deviation of the hallux.   Radiographs: 3 views of skeletally mature adult left foot: Antibiotic spacer noted there is dislocation of the left hallux that she is dorsally and laterally deviated on the spacer.  No abnormalities identified.   Assessment:   Osteomyelitis of left first MPJ status post first MPJ resection with antibiotic cement spacer  Plan:  Patient was evaluated and treated and all questions answered.  3.5 months s/p foot surgery left as above -Progressing and fully healed in regards to the surgical site however does have dislocation of the left hallux  with oversized cement spacer and pain related to above -Discussed surgical options with patient this would be either for definitive management would be for left hallux amputation with removal of the spacer.  Believe she would be able to heal this with low risk for further infection or pain.  Alternative option would be revision with removal of the current spacer cleaning up the bone margins and applying a smaller spacer though she is still at risk for pain with this option -Patient states -XR: See above -WB Status: Weightbearing as foot tolerated to left -Medications: ABX per infectious disease appreciate recommendations - Discussed with patient about options she says she will think about it and message me with what she would prefer to do.  For now she would like to continue trying to try different shoes and see how she does

## 2024-02-23 ENCOUNTER — Telehealth: Payer: Self-pay | Admitting: Neurosurgery

## 2024-02-23 NOTE — Telephone Encounter (Signed)
 Patient said that she did not need any refills on methocarbamol . She does not need to see Ruthann Cover or Felipe Horton at this time. Her back is perfect.

## 2024-02-23 NOTE — Telephone Encounter (Signed)
 DOS: 11/25/23 T6-7 laminectomy, right transpedicular decompression and disc debridement, T4-T10 posterior spinal fusion.   She has been on robaxin . Refill sent to pharmacy. Please let her know.   She also needs a f/u appt with Ruthann Cover or Felipe Horton in 4-5 weeks.

## 2024-02-24 ENCOUNTER — Ambulatory Visit: Admitting: Infectious Diseases

## 2024-02-24 ENCOUNTER — Ambulatory Visit (INDEPENDENT_AMBULATORY_CARE_PROVIDER_SITE_OTHER): Admitting: Podiatry

## 2024-02-24 DIAGNOSIS — M86072 Acute hematogenous osteomyelitis, left ankle and foot: Secondary | ICD-10-CM | POA: Diagnosis not present

## 2024-02-24 DIAGNOSIS — Z9889 Other specified postprocedural states: Secondary | ICD-10-CM | POA: Diagnosis not present

## 2024-02-24 DIAGNOSIS — M778 Other enthesopathies, not elsewhere classified: Secondary | ICD-10-CM | POA: Diagnosis not present

## 2024-02-24 NOTE — Progress Notes (Signed)
 Subjective:  Patient ID: Danielle Harrison, female    DOB: January 16, 1976,  MRN: [id number]  Chief Complaint  Patient presents with   Foot Pain    Left foot pain increased over the weekend. Thought about going to ED but waited to see us . Would like to discuss surgical options.     DOS: 10/30/2023 Procedure: Left first metatarsophalangeal joint resection with antibiotic spacer and wound closure  48 y.o. female returns for post-op check.  Patient presents for pain and swelling about the first MPJ worse than past visit recently last week.  Wants to discuss surgical options again.  Review of Systems: Negative except as noted in the HPI. Denies N/V/F/Ch.  Past Medical History:  Diagnosis Date   Anxiety    Arthritis    back   Asthma    HTN (hypertension)    Neuromuscular disorder (HCC)    weakness and numbness    Restless leg syndrome     Current Outpatient Medications:    acetaminophen  (TYLENOL ) 500 MG tablet, Take 2 tablets (1,000 mg total) by mouth every 8 (eight) hours as needed., Disp: 100 tablet, Rfl: 2   albuterol  (VENTOLIN  HFA) 108 (90 Base) MCG/ACT inhaler, Inhale 1 puff into the lungs every 6 (six) hours as needed for wheezing or shortness of breath., Disp: 8 g, Rfl: 3   budesonide -formoterol  (SYMBICORT ) 160-4.5 MCG/ACT inhaler, Inhale 2 puffs into the lungs 2 (two) times daily., Disp: 1 each, Rfl: 3   gabapentin  (NEURONTIN ) 300 MG capsule, Take 1 capsule (300 mg total) by mouth 3 (three) times daily., Disp: 90 capsule, Rfl: 2   metFORMIN  (GLUCOPHAGE ) 500 MG tablet, Take 1 tablet (500 mg total) by mouth 2 (two) times daily with a meal., Disp: 180 tablet, Rfl: 1   methocarbamol  (ROBAXIN ) 500 MG tablet, TAKE 1 TABLET BY MOUTH EVERY 6 HOURS AS NEEDED FOR MUSCLE SPASMS., Disp: 120 tablet, Rfl: 0   metoprolol  tartrate (LOPRESSOR ) 100 MG tablet, Take 1 tablet (100 mg total) by mouth 2 (two) times daily., Disp: 180 tablet, Rfl: 1   polyethylene glycol powder (GLYCOLAX /MIRALAX ) 17 GM/SCOOP  powder, Take 17 g by mouth daily. Mix as directed., Disp: 238 g, Rfl: 0  Social History   Tobacco Use  Smoking Status Former   Current packs/day: 0.50   Average packs/day: 0.5 packs/day for 6.0 years (3.0 ttl pk-yrs)   Types: Cigarettes  Smokeless Tobacco Never  Tobacco Comments   Quit 8/22    Allergies  Allergen Reactions   Diclofenac Hives   Objective:  There were no vitals filed for this visit. There is no height or weight on file to calculate BMI. Constitutional Well developed. Well nourished.  Vascular Foot warm and well perfused. Capillary refill normal to all digits.   Neurologic Normal speech. Oriented to person, place, and time. Epicritic sensation to light touch grossly present bilaterally.  Dermatologic Skin completely epithelialized no signs of Deis is noted no complication noted.  No open wound  Orthopedic: Tenderness about the first MPJ with significant edema as well as dorsal and lateral deviation of the hallux.       Radiographs: 02/19/2024 3 views of skeletally mature adult left foot: Antibiotic spacer noted there is dislocation of the left hallux that she is dorsally and laterally deviated on the spacer.  No abnormalities identified.   Assessment:   Osteomyelitis of left first MPJ status post first MPJ resection with antibiotic cement spacer  Plan:  Patient was evaluated and treated and all questions answered.  3.5  months s/p foot surgery left as above -Patient has increased pain and wants to proceed with surgery at this time -Discussed surgical options with patient this would be either for definitive management would be for left hallux amputation with removal of the spacer.  Believe she would be able to heal this with low risk for further infection or pain.  Alternative option would be revision with removal of the current spacer cleaning up the bone margins and applying a smaller spacer though she is still at risk for pain with this option -After  discussion of the risk benefits alternatives possible complications associate all procedures patient would prefer to proceed with the revision option with removal of the current spacer resection of the stump site of the first metatarsal and proximal phalanx and application of a smaller spacer possibly with a K wire fixation the intramedullary canal for stability. - Patient understands above and wishes to proceed informed surgical consent was obtained we will begin surgical planning

## 2024-02-25 ENCOUNTER — Telehealth: Payer: Self-pay | Admitting: Podiatry

## 2024-02-25 NOTE — Telephone Encounter (Signed)
 NOTIFIED PT THAT HER SURGERY IS SCHEDULED FOR 5/21 AT [facility], SCHEDULED TO START AT 900AM BUT THEY SOUL BE CALLING PT TO LET HER KNOW HER ARRIVAL TIME. FAXED H&P FORM TO PCP.

## 2024-02-25 NOTE — Telephone Encounter (Signed)
 DOS: 03/03/2024  (LT) PARTIAL EXCISION OF BONE -13086  (LT) REMOVAL W/ REINSERTION , NON BIODEGRADABLE DRUG DELIVERY IMPLANT-[id number]     EFFECTIVE DATE :  09/13/2022       PER THE [employer] PROVIDER PORTAL NO PRIOR IS REQ FOR CPT CODES [phone]

## 2024-02-27 ENCOUNTER — Other Ambulatory Visit: Payer: Self-pay

## 2024-02-27 ENCOUNTER — Encounter
Admission: RE | Admit: 2024-02-27 | Discharge: 2024-02-27 | Disposition: A | Discharge status: Home / Self Care | Source: Ambulatory Visit | Attending: Podiatry | Admitting: Podiatry

## 2024-02-27 DIAGNOSIS — Z01818 Encounter for other preprocedural examination: Secondary | ICD-10-CM

## 2024-02-27 HISTORY — DX: Osteomyelitis of vertebra, lumbar region: M46.26

## 2024-02-27 HISTORY — DX: Osteomyelitis, unspecified: M86.9

## 2024-02-27 HISTORY — DX: Methicillin resistant Staphylococcus aureus infection as the cause of diseases classified elsewhere: B95.62

## 2024-02-27 HISTORY — DX: Bacteremia: R78.81

## 2024-02-27 HISTORY — DX: Acute and subacute infective endocarditis: I33.0

## 2024-02-27 HISTORY — DX: Type 2 diabetes mellitus without complications: E11.9

## 2024-02-27 HISTORY — DX: Unspecified intestinal obstruction, unspecified as to partial versus complete obstruction: K56.609

## 2024-02-27 NOTE — Patient Instructions (Signed)
 Your procedure is scheduled on:03-03-24 Wednesday Report to the Registration Desk on the 1st floor of the [facility]. To find out your arrival time, please call [phone] between 1PM - 3PM on:03-02-24 Tuesday If your arrival time is 6:00 am, do not arrive before that time as the [facility] entrance doors do not open until 6:00 am.  REMEMBER: Instructions that are not followed completely may result in serious medical risk, up to and including death; or upon the discretion of your surgeon and anesthesiologist your surgery may need to be rescheduled.  Do not eat food OR drink any liquids after midnight the night before surgery.  No gum chewing or hard candies.  One week prior to surgery:Stop NOW (02-27-24) Stop Anti-inflammatories (NSAIDS) such as Advil, Aleve, Ibuprofen, Motrin, Naproxen, Naprosyn and Aspirin based products such as Excedrin, Goody's Powder, BC Powder. Stop ANY OVER THE COUNTER supplements until after surgery.  You may however, continue to take Tylenol  if needed for pain up until the day of surgery.  Stop metFORMIN  (GLUCOPHAGE ) 2 days prior to surgery-Last dose will be on 02-29-24 Monday  Continue taking all of your other prescription medications up until the day of surgery.  ON THE DAY OF SURGERY ONLY TAKE THESE MEDICATIONS WITH SIPS OF WATER : -metoprolol  tartrate (LOPRESSOR )  -gabapentin  (NEURONTIN )   Use your Albuterol  and Symbicort  inhalers the day of surgery and bring your Albuterol  Inhaler to the hospital  No Alcohol for 24 hours before or after surgery.  No Smoking including e-cigarettes for 24 hours before surgery.  No chewable tobacco products for at least 6 hours before surgery.  No nicotine patches on the day of surgery.  Do not use any "recreational" drugs for at least a week (preferably 2 weeks) before your surgery.  Please be advised that the combination of cocaine and anesthesia may have negative outcomes, up to and including death. If you test  positive for cocaine, your surgery will be cancelled.  On the morning of surgery brush your teeth with toothpaste and water , you may rinse your mouth with mouthwash if you wish. Do not swallow any toothpaste or mouthwash.  Use CHG Soap as directed on instruction sheet.  Do not wear jewelry, make-up, hairpins, clips or nail polish.  For welded (permanent) jewelry: bracelets, anklets, waist bands, etc.  Please have this removed prior to surgery.  If it is not removed, there is a chance that hospital personnel will need to cut it off on the day of surgery.  Do not wear lotions, powders, or perfumes.   Do not shave body hair from the neck down 48 hours before surgery.  Contact lenses, hearing aids and dentures may not be worn into surgery.  Do not bring valuables to the hospital. [facility] is not responsible for any missing/lost belongings or valuables.    Notify your doctor if there is any change in your medical condition (cold, fever, infection).  Wear comfortable clothing (specific to your surgery type) to the hospital.  After surgery, you can help prevent lung complications by doing breathing exercises.  Take deep breaths and cough every 1-2 hours. Your doctor may order a device called an Incentive Spirometer to help you take deep breaths. When coughing or sneezing, hold a pillow firmly against your incision with both hands. This is called "splinting." Doing this helps protect your incision. It also decreases belly discomfort.  If you are being admitted to the hospital overnight, leave your suitcase in the car. After surgery it may be  brought to your room.  In case of increased patient census, it may be necessary for you, the patient, to continue your postoperative care in the Same Day Surgery department.  If you are being discharged the day of surgery, you will not be allowed to drive home. You will need a responsible individual to drive you home and stay with you for 24 hours  after surgery.   If you are taking public transportation, you will need to have a responsible individual with you.  Please call the Pre-admissions Testing Dept. at [phone] if you have any questions about these instructions.  Surgery Visitation Policy:  Patients having surgery or a procedure may have two visitors.  Children under the age of 50 must have an adult with them who is not the patient.     Preparing for Surgery with CHLORHEXIDINE  GLUCONATE (CHG) Soap  Chlorhexidine  Gluconate (CHG) Soap  o An antiseptic cleaner that kills germs and bonds with the skin to continue killing germs even after washing  o Used for showering the night before surgery and morning of surgery  Before surgery, you can play an important role by reducing the number of germs on your skin.  CHG (Chlorhexidine  gluconate) soap is an antiseptic cleanser which kills germs and bonds with the skin to continue killing germs even after washing.  Please do not use if you have an allergy to CHG or antibacterial soaps. If your skin becomes reddened/irritated stop using the CHG.  1. Shower the NIGHT BEFORE SURGERY and the MORNING OF SURGERY with CHG soap.  2. If you choose to wash your hair, wash your hair first as usual with your normal shampoo.  3. After shampooing, rinse your hair and body thoroughly to remove the shampoo.  4. Use CHG as you would any other liquid soap. You can apply CHG directly to the skin and wash gently with a scrungie or a clean washcloth.  5. Apply the CHG soap to your body only from the neck down. Do not use on open wounds or open sores. Avoid contact with your eyes, ears, mouth, and genitals (private parts). Wash face and genitals (private parts) with your normal soap.  6. Wash thoroughly, paying special attention to the area where your surgery will be performed.  7. Thoroughly rinse your body with warm water .  8. Do not shower/wash with your normal soap after using and rinsing  off the CHG soap.  9. Pat yourself dry with a clean towel.  10. Wear clean pajamas to bed the night before surgery.  12. Place clean sheets on your bed the night of your first shower and do not sleep with pets.  13. Shower again with the CHG soap on the day of surgery prior to arriving at the hospital.  14. Do not apply any deodorants/lotions/powders.  15. Please wear clean clothes to the hospital.

## 2024-03-02 ENCOUNTER — Ambulatory Visit
Admission: RE | Admit: 2024-03-02 | Discharge: 2024-03-02 | Disposition: A | Discharge status: Home / Self Care | Source: Ambulatory Visit | Attending: Physician Assistant | Admitting: Physician Assistant

## 2024-03-02 DIAGNOSIS — M4624 Osteomyelitis of vertebra, thoracic region: Secondary | ICD-10-CM

## 2024-03-02 DIAGNOSIS — M4626 Osteomyelitis of vertebra, lumbar region: Secondary | ICD-10-CM

## 2024-03-02 MED ORDER — SODIUM CHLORIDE 0.9 % IV SOLN: INTRAVENOUS | Status: DC

## 2024-03-02 MED ORDER — ORAL CARE MOUTH RINSE: 15.0000 mL | Freq: Once | OROMUCOSAL | Status: AC

## 2024-03-02 MED ORDER — CHLORHEXIDINE GLUCONATE 0.12 % MT SOLN
15.0000 mL | Freq: Once | OROMUCOSAL | Status: AC
  Administered 2024-03-03: 15 mL via OROMUCOSAL

## 2024-03-02 MED ORDER — GADOBUTROL 1 MMOL/ML IV SOLN
5.0000 mL | Freq: Once | INTRAVENOUS | Status: AC | PRN
  Administered 2024-03-02: 5 mL via INTRAVENOUS

## 2024-03-02 MED ORDER — VANCOMYCIN HCL IN DEXTROSE 1-5 GM/200ML-% IV SOLN
1000.0000 mg | Freq: Once | INTRAVENOUS | Status: AC
  Administered 2024-03-03: 1000 mg via INTRAVENOUS

## 2024-03-03 ENCOUNTER — Encounter
Admission: RE | Disposition: A | Discharge status: Home / Self Care | Payer: Self-pay | Source: Home / Self Care | Attending: Podiatry

## 2024-03-03 ENCOUNTER — Ambulatory Visit
Admission: RE | Admit: 2024-03-03 | Discharge: 2024-03-03 | Disposition: A | Discharge status: Home / Self Care | Attending: Podiatry | Admitting: Podiatry

## 2024-03-03 ENCOUNTER — Ambulatory Visit

## 2024-03-03 ENCOUNTER — Other Ambulatory Visit: Payer: Self-pay

## 2024-03-03 ENCOUNTER — Encounter: Payer: Self-pay | Admitting: Podiatry

## 2024-03-03 DIAGNOSIS — M86072 Acute hematogenous osteomyelitis, left ankle and foot: Secondary | ICD-10-CM | POA: Diagnosis not present

## 2024-03-03 DIAGNOSIS — E1169 Type 2 diabetes mellitus with other specified complication: Secondary | ICD-10-CM | POA: Diagnosis not present

## 2024-03-03 DIAGNOSIS — I10 Essential (primary) hypertension: Secondary | ICD-10-CM | POA: Insufficient documentation

## 2024-03-03 DIAGNOSIS — M7989 Other specified soft tissue disorders: Secondary | ICD-10-CM | POA: Diagnosis not present

## 2024-03-03 DIAGNOSIS — Z7951 Long term (current) use of inhaled steroids: Secondary | ICD-10-CM | POA: Insufficient documentation

## 2024-03-03 DIAGNOSIS — G2581 Restless legs syndrome: Secondary | ICD-10-CM | POA: Insufficient documentation

## 2024-03-03 DIAGNOSIS — Z01818 Encounter for other preprocedural examination: Secondary | ICD-10-CM

## 2024-03-03 DIAGNOSIS — J45909 Unspecified asthma, uncomplicated: Secondary | ICD-10-CM | POA: Insufficient documentation

## 2024-03-03 DIAGNOSIS — Z7984 Long term (current) use of oral hypoglycemic drugs: Secondary | ICD-10-CM | POA: Insufficient documentation

## 2024-03-03 DIAGNOSIS — M869 Osteomyelitis, unspecified: Secondary | ICD-10-CM | POA: Diagnosis not present

## 2024-03-03 DIAGNOSIS — Z87891 Personal history of nicotine dependence: Secondary | ICD-10-CM | POA: Diagnosis not present

## 2024-03-03 HISTORY — PX: METATARSAL HEAD EXCISION: SHX5027

## 2024-03-03 LAB — GLUCOSE, CAPILLARY
Glucose-Capillary: 189 mg/dL — ABNORMAL HIGH (ref 70–99)
Glucose-Capillary: 142 mg/dL — ABNORMAL HIGH (ref 70–99)

## 2024-03-03 SURGERY — EXCISION, METATARSAL BONE, HEAD
Anesthesia: General | Site: Toe | Laterality: Left

## 2024-03-03 MED ORDER — OXYCODONE HCL 5 MG/5ML PO SOLN: 5.0000 mg | Freq: Once | ORAL | Status: AC | PRN

## 2024-03-03 MED ORDER — DEXAMETHASONE SODIUM PHOSPHATE 10 MG/ML IJ SOLN
INTRAMUSCULAR | Status: DC | PRN
  Administered 2024-03-03: 10 mg via INTRAVENOUS

## 2024-03-03 MED ORDER — OXYCODONE HCL 5 MG PO TABS
ORAL_TABLET | ORAL | Status: AC
Start: 2024-03-03 — End: ?
  Filled 2024-03-03: qty 1

## 2024-03-03 MED ORDER — BUPIVACAINE HCL (PF) 0.5 % IJ SOLN
INTRAMUSCULAR | Status: DC | PRN
  Administered 2024-03-03: 20 mL

## 2024-03-03 MED ORDER — PHENYLEPHRINE 80 MCG/ML (10ML) SYRINGE FOR IV PUSH (FOR BLOOD PRESSURE SUPPORT)
PREFILLED_SYRINGE | INTRAVENOUS | Status: AC
  Filled 2024-03-03: qty 10

## 2024-03-03 MED ORDER — PHENYLEPHRINE 80 MCG/ML (10ML) SYRINGE FOR IV PUSH (FOR BLOOD PRESSURE SUPPORT)
PREFILLED_SYRINGE | INTRAVENOUS | Status: DC | PRN
  Administered 2024-03-03: 40 ug via INTRAVENOUS
  Administered 2024-03-03: 80 ug via INTRAVENOUS
  Administered 2024-03-03: 40 ug via INTRAVENOUS

## 2024-03-03 MED ORDER — PROPOFOL 500 MG/50ML IV EMUL
INTRAVENOUS | Status: DC | PRN
  Administered 2024-03-03: 125 ug/kg/min via INTRAVENOUS

## 2024-03-03 MED ORDER — DEXMEDETOMIDINE HCL IN NACL 80 MCG/20ML IV SOLN
INTRAVENOUS | Status: DC | PRN
Start: 2024-03-03 — End: 2024-03-03
  Administered 2024-03-03: 8 ug via INTRAVENOUS

## 2024-03-03 MED ORDER — MIDAZOLAM HCL 2 MG/2ML IJ SOLN
INTRAMUSCULAR | Status: AC
  Filled 2024-03-03: qty 2

## 2024-03-03 MED ORDER — LIDOCAINE HCL (PF) 1 % IJ SOLN
INTRAMUSCULAR | Status: AC
  Filled 2024-03-03: qty 30

## 2024-03-03 MED ORDER — LIDOCAINE HCL (PF) 2 % IJ SOLN
INTRAMUSCULAR | Status: AC
  Filled 2024-03-03: qty 5

## 2024-03-03 MED ORDER — FENTANYL CITRATE (PF) 250 MCG/5ML IJ SOLN
INTRAMUSCULAR | Status: AC
  Filled 2024-03-03: qty 5

## 2024-03-03 MED ORDER — GENTAMICIN SULFATE 40 MG/ML IJ SOLN
INTRAMUSCULAR | Status: AC
  Filled 2024-03-03: qty 2

## 2024-03-03 MED ORDER — ACETAMINOPHEN 10 MG/ML IV SOLN: 1000.0000 mg | Freq: Once | INTRAVENOUS | Status: DC | PRN

## 2024-03-03 MED ORDER — ACETAMINOPHEN 10 MG/ML IV SOLN
INTRAVENOUS | Status: DC | PRN
  Administered 2024-03-03: 1000 mg via INTRAVENOUS

## 2024-03-03 MED ORDER — PROPOFOL 10 MG/ML IV BOLUS
INTRAVENOUS | Status: DC | PRN
  Administered 2024-03-03: 40 mg via INTRAVENOUS
  Administered 2024-03-03 (×2): 20 mg via INTRAVENOUS

## 2024-03-03 MED ORDER — ONDANSETRON HCL 4 MG/2ML IJ SOLN
INTRAMUSCULAR | Status: DC | PRN
  Administered 2024-03-03: 4 mg via INTRAVENOUS

## 2024-03-03 MED ORDER — FENTANYL CITRATE (PF) 100 MCG/2ML IJ SOLN: 25.0000 ug | INTRAMUSCULAR | Status: DC | PRN

## 2024-03-03 MED ORDER — OXYCODONE HCL 5 MG PO TABS
5.0000 mg | ORAL_TABLET | Freq: Once | ORAL | Status: AC | PRN
  Administered 2024-03-03: 5 mg via ORAL

## 2024-03-03 MED ORDER — ROCURONIUM BROMIDE 10 MG/ML (PF) SYRINGE
PREFILLED_SYRINGE | INTRAVENOUS | Status: AC
  Filled 2024-03-03: qty 10

## 2024-03-03 MED ORDER — MIDAZOLAM HCL 2 MG/2ML IJ SOLN
INTRAMUSCULAR | Status: DC | PRN
  Administered 2024-03-03: 2 mg via INTRAVENOUS

## 2024-03-03 MED ORDER — ONDANSETRON HCL 4 MG/2ML IJ SOLN: 4.0000 mg | Freq: Once | INTRAMUSCULAR | Status: DC | PRN

## 2024-03-03 MED ORDER — KETAMINE HCL 50 MG/5ML IJ SOSY
PREFILLED_SYRINGE | INTRAMUSCULAR | Status: AC
  Filled 2024-03-03: qty 5

## 2024-03-03 MED ORDER — OXYCODONE-ACETAMINOPHEN 5-325 MG PO TABS: 1.0000 | ORAL_TABLET | ORAL | 0 refills | Status: DC | PRN

## 2024-03-03 MED ORDER — VANCOMYCIN HCL IN DEXTROSE 1-5 GM/200ML-% IV SOLN
INTRAVENOUS | Status: AC
  Filled 2024-03-03: qty 200

## 2024-03-03 MED ORDER — VANCOMYCIN HCL 1000 MG IV SOLR
INTRAVENOUS | Status: AC
Start: 2024-03-03 — End: ?
  Filled 2024-03-03: qty 20

## 2024-03-03 MED ORDER — CLINDAMYCIN HCL 300 MG PO CAPS
300.0000 mg | ORAL_CAPSULE | Freq: Three times a day (TID) | ORAL | 0 refills | Status: AC
Start: 2024-03-03 — End: 2024-03-08

## 2024-03-03 MED ORDER — PROPOFOL 10 MG/ML IV BOLUS
INTRAVENOUS | Status: AC
  Filled 2024-03-03: qty 40

## 2024-03-03 MED ORDER — CHLORHEXIDINE GLUCONATE 0.12 % MT SOLN
OROMUCOSAL | Status: AC
  Filled 2024-03-03: qty 15

## 2024-03-03 MED ORDER — ACETAMINOPHEN 10 MG/ML IV SOLN
INTRAVENOUS | Status: AC
  Filled 2024-03-03: qty 100

## 2024-03-03 MED ORDER — EPHEDRINE 5 MG/ML INJ
INTRAVENOUS | Status: AC
  Filled 2024-03-03: qty 5

## 2024-03-03 MED ORDER — BUPIVACAINE HCL (PF) 0.5 % IJ SOLN
INTRAMUSCULAR | Status: AC
  Filled 2024-03-03: qty 30

## 2024-03-03 MED ORDER — TOBRAMYCIN SULFATE 80 MG/2ML IJ SOLN
INTRAMUSCULAR | Status: AC
  Filled 2024-03-03: qty 2

## 2024-03-03 MED ORDER — FENTANYL CITRATE (PF) 100 MCG/2ML IJ SOLN
INTRAMUSCULAR | Status: DC | PRN
Start: 2024-03-03 — End: 2024-03-03
  Administered 2024-03-03 (×2): 25 ug via INTRAVENOUS

## 2024-03-03 MED ORDER — ONDANSETRON HCL 4 MG/2ML IJ SOLN
INTRAMUSCULAR | Status: AC
  Filled 2024-03-03: qty 2

## 2024-03-03 MED ORDER — GLYCOPYRROLATE 0.2 MG/ML IJ SOLN
INTRAMUSCULAR | Status: AC
  Filled 2024-03-03: qty 1

## 2024-03-03 MED ORDER — PROPOFOL 1000 MG/100ML IV EMUL
INTRAVENOUS | Status: AC
  Filled 2024-03-03: qty 100

## 2024-03-03 MED ORDER — DEXAMETHASONE SODIUM PHOSPHATE 10 MG/ML IJ SOLN
INTRAMUSCULAR | Status: AC
  Filled 2024-03-03: qty 1

## 2024-03-03 SURGICAL SUPPLY — 51 items
BLADE MED AGGRESSIVE (BLADE) ×1 IMPLANT
BLADE OSC/SAGITTAL MD 5.5X18 (BLADE) IMPLANT
BLADE SURG 15 STRL LF DISP TIS (BLADE) ×2 IMPLANT
BLADE SURG MINI STRL (BLADE) ×1 IMPLANT
BNDG ELASTIC 4INX 5YD STR LF (GAUZE/BANDAGES/DRESSINGS) ×1 IMPLANT
BNDG ESMARCH 4X12 STRL LF (GAUZE/BANDAGES/DRESSINGS) ×1 IMPLANT
BNDG GAUZE DERMACEA FLUFF 4 (GAUZE/BANDAGES/DRESSINGS) ×1 IMPLANT
BOWL CEMENT MIX W/ADAPTER (MISCELLANEOUS) IMPLANT
CNTNR URN SCR LID CUP LEK RST (MISCELLANEOUS) IMPLANT
CUFF TOURN SGL QUICK 12 (TOURNIQUET CUFF) IMPLANT
CUFF TOURN SGL QUICK 18X4 (TOURNIQUET CUFF) IMPLANT
DRAPE FLUOR MINI C-ARM 54X84 (DRAPES) IMPLANT
DRSG EMULSION OIL 3X3 NADH (GAUZE/BANDAGES/DRESSINGS) IMPLANT
DRSG EMULSION OIL 3X8 NADH (GAUZE/BANDAGES/DRESSINGS) IMPLANT
DRSG TELFA 3X8 NADH STRL (GAUZE/BANDAGES/DRESSINGS) ×1 IMPLANT
DURAPREP 26ML APPLICATOR (WOUND CARE) ×1 IMPLANT
ELECTRODE REM PT RTRN 9FT ADLT (ELECTROSURGICAL) ×1 IMPLANT
GAUZE PAD ABD 8X10 STRL (GAUZE/BANDAGES/DRESSINGS) ×1 IMPLANT
GAUZE SPONGE 4X4 12PLY STRL (GAUZE/BANDAGES/DRESSINGS) ×2 IMPLANT
GAUZE STRETCH 2X75IN STRL (MISCELLANEOUS) IMPLANT
GAUZE XEROFORM 1X8 LF (GAUZE/BANDAGES/DRESSINGS) ×1 IMPLANT
GLOVE BIOGEL PI IND STRL 7.5 (GLOVE) ×1 IMPLANT
GLOVE SURG SYN 7.5 E (GLOVE) ×2 IMPLANT
GLOVE SURG SYN 7.5 PF PI (GLOVE) ×1 IMPLANT
GOWN STRL REUS W/ TWL LRG LVL3 (GOWN DISPOSABLE) IMPLANT
GOWN STRL REUS W/ TWL XL LVL3 (GOWN DISPOSABLE) ×1 IMPLANT
HANDPIECE VERSAJET DEBRIDEMENT (MISCELLANEOUS) IMPLANT
KIT TURNOVER KIT A (KITS) ×1 IMPLANT
LABEL OR SOLS (LABEL) ×1 IMPLANT
MANIFOLD NEPTUNE II (INSTRUMENTS) ×1 IMPLANT
NDL FILTER BLUNT 18X1 1/2 (NEEDLE) ×1 IMPLANT
NDL HYPO 25X1 1.5 SAFETY (NEEDLE) ×2 IMPLANT
NEEDLE FILTER BLUNT 18X1 1/2 (NEEDLE) ×1 IMPLANT
NEEDLE HYPO 25X1 1.5 SAFETY (NEEDLE) ×1 IMPLANT
NS IRRIG 500ML POUR BTL (IV SOLUTION) ×1 IMPLANT
PACK EXTREMITY ARMC (MISCELLANEOUS) ×1 IMPLANT
PAD ABD DERMACEA PRESS 5X9 (GAUZE/BANDAGES/DRESSINGS) ×1 IMPLANT
PAD PREP OB/GYN DISP 24X41 (PERSONAL CARE ITEMS) ×1 IMPLANT
PENCIL SMOKE EVACUATOR (MISCELLANEOUS) ×1 IMPLANT
SOL .9 NS 3000ML IRR UROMATIC (IV SOLUTION) IMPLANT
SOLUTION PREP PVP 2OZ (MISCELLANEOUS) ×1 IMPLANT
STAPLER SKIN PROX 35W (STAPLE) ×1 IMPLANT
STRIP CLOSURE SKIN 1/4X4 (GAUZE/BANDAGES/DRESSINGS) IMPLANT
SUT MNCRL AB 3-0 PS2 27 (SUTURE) ×1 IMPLANT
SUT PROLENE 3 0 PS 2 (SUTURE) ×1 IMPLANT
SWAB CULTURE AMIES ANAERIB BLU (MISCELLANEOUS) IMPLANT
SYR 10ML LL (SYRINGE) ×1 IMPLANT
TIP FAN IRRIG PULSAVAC PLUS (DISPOSABLE) IMPLANT
TRAP FLUID SMOKE EVACUATOR (MISCELLANEOUS) ×1 IMPLANT
WATER STERILE IRR 500ML POUR (IV SOLUTION) ×1 IMPLANT
WIRE Z .062 C-WIRE SPADE TIP (WIRE) IMPLANT

## 2024-03-03 NOTE — Anesthesia Procedure Notes (Signed)
 Procedure Name: MAC Date/Time: 03/03/2024 7:35 AM  Performed by: Marisue Side, CRNAPre-anesthesia Checklist: Patient identified, Emergency Drugs available, Suction available and Patient being monitored Patient Re-evaluated:Patient Re-evaluated prior to induction Oxygen  Delivery Method: Simple face mask Induction Type: IV induction

## 2024-03-03 NOTE — H&P (Signed)
 SURGICAL HISTORY and PHYSICAL FOOT & ANKLE SURGERY    Date Time: 03/03/24 7:18 AM Physician: Rosemarie Conquest   History of Presenting Illness: Danielle Harrison is a 48 y.o. year old female presenting to [facility] for surgery on Left foot today. The patient complains of pain to 1st mpj that has been present for few months, but that has progressively worsened during the past few weeks after prior 1st MPJ resection and cement spacer implant. Conservative management was attempted including offloading, but was ultimately unsuccessful. The patient has elected to pursue surgical management and understands the procedure, benefits, risks/complications, and alternatives to surgery. She has remained npo since MN. Otherwise feeling well today, no other complaints. Denies fever, chills, nausea, emesis, dyspnea, and chest pain.  Past Medical History: Past Medical History:  Diagnosis Date   Acute bacterial endocarditis    Anxiety    Arthritis    back   Asthma    DM (diabetes mellitus), type 2 (HCC)    HTN (hypertension)    MRSA bacteremia    Neuromuscular disorder (HCC)    weakness and numbness    Osteomyelitis of lumbar spine (HCC)    Osteomyelitis of toe of left foot (HCC)    Restless leg syndrome    SBO (small bowel obstruction) (HCC)     Past Surgical History: Past Surgical History:  Procedure Laterality Date   APPLICATION OF INTRAOPERATIVE CT SCAN N/A 11/25/2023   Procedure: APPLICATION OF INTRAOPERATIVE CT SCAN;  Surgeon: Carroll Clamp, MD;  Location: [facility] OR[facility];  Service: Neurosurgery;  Laterality: N/A;   BIOPSY  06/28/2019   Procedure: BIOPSY;  Surgeon: Irby Mannan, MD;  Location: [facility];  Service: Endoscopy;;   CESAREAN SECTION     x2   COLONOSCOPY WITH PROPOFOL  N/A 06/28/2019   Procedure: COLONOSCOPY WITH PROPOFOL ;  Surgeon: Irby Mannan, MD;  Location: [facility];  Service: Endoscopy;  Laterality: N/A;   ESOPHAGOGASTRODUODENOSCOPY (EGD) WITH PROPOFOL  N/A  06/28/2019   Procedure: ESOPHAGOGASTRODUODENOSCOPY (EGD) WITH PROPOFOL ;  Surgeon: Irby Mannan, MD;  Location: [facility];  Service: Endoscopy;  Laterality: N/A;   INCISION AND DRAINAGE Left 10/27/2023   Procedure: INCISION AND DRAINAGE LEFT FOOT;  Surgeon: Evertt Hoe, DPM;  Location: [facility] OR[facility];  Service: Orthopedics/Podiatry;  Laterality: Left;   INCISION AND DRAINAGE Left 10/30/2023   Procedure: LEFT FOOT FIRST METATARSAL PHALANGEAL JOINT RESECTION, ANTIBIOTIC SPACER, WASHOUT, WOUND CLOSURE;  Surgeon: Evertt Hoe, DPM;  Location: [facility] OR[facility];  Service: Orthopedics/Podiatry;  Laterality: Left;   IR LUMBAR DISC ASPIRATION W/IMG GUIDE  01/19/2024   TEE WITHOUT CARDIOVERSION N/A 10/29/2023   Procedure: TRANSESOPHAGEAL ECHOCARDIOGRAM (TEE);  Surgeon: Devorah Fonder, MD;  Location: [facility];  Service: Cardiovascular;  Laterality: N/A;   THORACIC LAMINECTOMY FOR EPIDURAL ABSCESS N/A 11/25/2023   Procedure: T6-T7 thoracic laminectomy, right sided transpedicular decompression and disc debridement;  Surgeon: Carroll Clamp, MD;  Location: [facility];  Service: Neurosurgery;  Laterality: N/A;   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   Procedure: LEFT FOOT 1ST MPJ RESECTION;  Surgeon: Evertt Hoe, DPM;  Location: [facility];  Service: Orthopedics/Podiatry;  Laterality: Left;   TRANSMETATARSAL AMPUTATION Left 10/30/2023   Procedure: LEFT FOOT 1ST MPJ RESECTION;  Surgeon: Evertt Hoe, DPM;  Location: [facility];  Service: Orthopedics/Podiatry;  Laterality: Left;    Allergies: Allergies  Allergen Reactions   Diclofenac Hives    Home Medications: Current Facility-Administered Medications  Medication Dose Route Frequency Provider Last Rate Last Admin  0.9 %  sodium chloride  infusion   Intravenous Continuous Lattie Poli, MD 50 mL/hr at 03/03/24 0648 New Bag at 03/03/24 0648   vancomycin  (VANCOCIN ) IVPB 1000 mg/200 mL premix  1,000 mg Intravenous  Once Oberon Hehir F, DPM 200 mL/hr at 03/03/24 0649 1,000 mg at 03/03/24 3086   Medications Prior to Admission  Medication Sig Dispense Refill Last Dose/Taking   albuterol  (PROVENTIL ) (2.5 MG/3ML) 0.083% nebulizer solution Take 2.5 mg by nebulization every 6 (six) hours as needed for wheezing or shortness of breath.   Past Month   albuterol  (VENTOLIN  HFA) 108 (90 Base) MCG/ACT inhaler Inhale 1 puff into the lungs every 6 (six) hours as needed for wheezing or shortness of breath. (Patient taking differently: Inhale 1 puff into the lungs 4 (four) times daily.) 8 g 3 03/03/2024 Morning   budesonide -formoterol  (SYMBICORT ) 160-4.5 MCG/ACT inhaler Inhale 2 puffs into the lungs 2 (two) times daily. 1 each 3 03/03/2024 Morning   gabapentin  (NEURONTIN ) 300 MG capsule Take 1 capsule (300 mg total) by mouth 3 (three) times daily. 90 capsule 2 03/03/2024 Morning   metFORMIN  (GLUCOPHAGE ) 500 MG tablet Take 1 tablet (500 mg total) by mouth 2 (two) times daily with a meal. 180 tablet 1 03/03/2024 Morning   metoprolol  tartrate (LOPRESSOR ) 100 MG tablet Take 1 tablet (100 mg total) by mouth 2 (two) times daily. 180 tablet 1 03/03/2024 at  5:00 AM   methocarbamol  (ROBAXIN ) 500 MG tablet TAKE 1 TABLET BY MOUTH EVERY 6 HOURS AS NEEDED FOR MUSCLE SPASMS. (Patient not taking: Reported on 03/03/2024) 120 tablet 0 Completed Course     Social History: @IPSOCHX @  Family History: Family History  Problem Relation Age of Onset   Colon cancer Neg Hx     Review of Systems: As per HPI. A complete 10 point review of systems was performed and all other systems reviewed were negative.  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 female with history prior 1st MPJ resection of the left foot with cement spacer implant for 1st MPJ hematogenous OM now with worsening pain at 1st MPJ. Concern for implant pain due to oversized implant.    Plan: - Will proceed with surgical management including revision 1st MPJ arthroplasty with resection bone 1st met and proximal phalanx and abx cement spacer exchange possible k wire fixation. . - Procedure, benefits, risks, and alternatives discussed with patient and informed consent obtained. - Pt aware that if this fails due to residual pain or infection will require hallux amputation as salvage procedure.  - Prophylactic antibiotics: 1 gm vancomycin  pre op. - Surgical site marked.  [place] [place] Leonel Mccollum

## 2024-03-03 NOTE — Op Note (Signed)
 Full Operative Report  Date of Operation: 8:40 AM, 03/03/2024   Patient: Danielle Harrison - 48 y.o. female  Surgeon: Evertt Hoe, DPM   Assistant: None  Diagnosis: Acute hematogenous osteomyelitis of left foot  Procedure:  1. Partial resection of 1st metatarsal and 1st proximal phalanx, Left foot 2. Removal and reinsertion of antibiotic cement spacer non biodegradable implant, left foot    Anesthesia: General  No responsible provider has been recorded for the case.  Anesthesiologist: Enrique Harvest, MD; Lattie Poli, MD CRNA: Marisue Side, CRNA Student Nurse Anesthetist: Lanita Pitman, RN   Estimated Blood Loss: Minimal  Hemostasis: 1) Anatomical dissection, mechanical compression, electrocautery 2) Ankle tourniquet 250 mm Hg, 30 min  Implants: 1 x .062 k wire  Materials: Monocryl 3-0, Prolene 3-0,  Injectables: 1) Pre-operatively: 20 cc of 50:50 mixture 1%lidocaine  plain and 0.5% marcaine  plain 2) Post-operatively: None   Specimens: - Pathology: None - Microbiology: deep fluid swab about spacer, left foot   Antibiotics: 1 gm vancomycin  IV pre op  Drains: None  Complications: Patient tolerated the procedure well without complication.   Operative findings: As below in detailed report  Indications for Procedure: Danielle Harrison presents to [place], [place], [place] with a chief complaint of chronic pain and edema at the 1st MPJ s/p prior 1st mpj resection and cement spacer application. The patient has failed conservative treatments of various modalities. At this time the patient has elected to proceed with surgical correction. All alternatives, risks, and complications of the procedures were thoroughly explained to the patient. Patient exhibits appropriate understanding of all discussion points and informed consent was signed and obtained in the chart with no guarantees to surgical outcome given or implied.  Description of Procedure: Patient  was brought to the operating room. Patient was positioned on the OR table in supine position well padded. A surgical timeout was performed and all members of the operating room, the procedure, and the surgical site were identified. anesthesia occurred as per anesthesia record. Local anesthetic as previously described was then injected about the operative field in a local infiltrative block.  The operative lower extremity as noted above was then prepped and draped in the usual sterile manner. The following procedure then began.  Attention was directed to the dorsal aspect of the first MPJ of the left foot.  Incision was made over the previous incision.  Dissection was carried down to the level of the antibiotic cement spacer.  Significant scar tissue was noted overlying the spacer.  Upon reaching the spacer level there was a small amount of clear fluid expressed from around the spacer.  I cultured this for aerobic anaerobic.  Further dissection was carried out around the spacer with 15 blade so as to free up the spacer proximal and distal.  Arlena Belts was used to get in between the proximal end of the spacer in the first metatarsal base.  The spacer was mobile and it was grasped with a kocher and removed in 1 piece.  There was no evidence of residual infection at the first metatarsal base or the first proximal phalanx at the prior cut margins.  Next using sagittal saw transverse osteotomy was carried out approximately 0.3 cm proximal to the current cut edge of the first metatarsal base so as to freshen the edge.  The same was done for the proximal phalanx, slightly distal to the cut edge.  The surgical cavity was then irrigated with 1 L of sterile saline via pulse lavage  Next an antibiotic cement spacer with gentamicin impregnated was mixed on the back table and then formed into a small cylindrical piece.  This was approximately one half the size of the prior spacer.  This was allowed to harden slightly and then  placed within the surgical cavity.  Next using a 0.062 K wire the proximal aspect of the spacer was then fixated to the first metatarsal base so as to prevent migration of the spacer.  Fluoroscopy was used to confirm size and position of the spacer.  There was notably less prominence at the medial and plantar aspect of the first MPJ where there was previously a lot of prominence.  Then using a layered closure with Monocryl 3-0 and Prolene 3-0 the incision was closed under minimal tension.  The surgical site was then dressed with steri strips, xerform, 4x4 kerlix ace wrap. The patient tolerated both the procedure and anesthesia well with vital signs stable throughout. The patient was transferred in good condition and all vital signs stable  from the OR to recovery under the discretion of anesthesia.  Condition: Vital signs stable, neurovascular status unchanged from preoperative   Surgical plan:  Removed old spacer and placed 1/2 size spacer with k wire fixation, noted less prominence post op. No evidence of residual infection. NWB left foot  The patient will be NWB in a post op shoe to the operative limb until further instructed. The dressing is to remain clean, dry, and intact. Will continue to follow unless noted elsewhere.   Russ Course, DPM [facility]

## 2024-03-03 NOTE — Transfer of Care (Signed)
 Immediate Anesthesia Transfer of Care Note  Patient: Danielle Harrison  Procedure(s) Performed: EXCISION, METATARSAL BONE, HEAD (Left: Toe)  Patient Location: PACU  Anesthesia Type:General  Level of Consciousness: awake, alert , oriented, and patient cooperative  Airway & Oxygen  Therapy: Patient Spontanous Breathing  Post-op Assessment: Report given to RN, Post -op Vital signs reviewed and stable, and Patient moving all extremities  Post vital signs: stable  Last Vitals:  Vitals Value Taken Time  BP 98/69 03/03/24 0832  Temp 37.1 C 03/03/24 0832  Pulse 74 03/03/24 0833  Resp 16 03/03/24 0833  SpO2 96 % 03/03/24 0833  Vitals shown include unfiled device data.  Last Pain:  Vitals:   03/03/24 0832  TempSrc:   PainSc: 0-No pain         Complications: No notable events documented.

## 2024-03-03 NOTE — Discharge Instructions (Signed)
 Foot & Ankle Surgery Patient Discharge Instructions:   Arrange to have an adult drive you home after surgery. If you had general anesthesia, it may take a day or more to fully recover. So, for at least the next 24 hours: Do not drive or use machinery or power tools; do not drink alcohol; and do not make any major decisions.   Bandages: Keep your dressings clean, dry, and intact. Do not remove or change. When bathing/showering, cover the bandage or cast with a plastic bag to keep it dry (still keep the area away from the water ) and use a chair, do not stand. Don't remove your bandage until your doctor tells you to. If your bandage gets wet or dirty, check with your doctor. You can likely replace it with a clean, dry one.  Activity: Non weight bearing to left foot with crutches Sit or lie down when possible. Put a pillow under your heel to raise your foot above the level of your heart. Place ice bag or pack behind knee on surgical side for 20 minutes every hour that you are awake for the first 24-48 hours. You can drive again when instructed by your doctor. Wear your surgical shoe at all times unless told otherwise by your health care provider. Use crutches or a cane as directed. Follow your doctor's instructions about putting weight on your foot.  Diet: Start with liquids and light foods (such as dry toast, bananas, and applesauce). As you feel up to it, slowly return to your normal diet. Drink at least six to eight glasses of water  or other nonalcoholic fluids a day. To avoid nausea, eat before taking narcotic pain medications.  Medications: Take all medications as instructed. Take pain medications on time. Do not wait until the pain is bad before taking your medications. Avoid alcohol while on pain medications.  Call your doctor if: Continuous bleeding through dressings. If your dressings get wet. Fevers over 100.4 degrees Fahrenheit (38 degrees Celsius). Chest pains or difficulty  breathing.

## 2024-03-03 NOTE — Anesthesia Preprocedure Evaluation (Signed)
 Anesthesia Evaluation  Patient identified by MRN, date of birth, ID band Patient awake    Reviewed: Allergy & Precautions, NPO status , Patient's Chart, lab work & pertinent test results  History of Anesthesia Complications Negative for: history of anesthetic complications  Airway Mallampati: II  TM Distance: >3 FB Neck ROM: Full    Dental no notable dental hx. (+) Teeth Intact   Pulmonary asthma , neg sleep apnea, neg COPD, Patient abstained from smoking.Not current smoker, former smoker   Pulmonary exam normal breath sounds clear to auscultation       Cardiovascular Exercise Tolerance: Good METShypertension, Pt. on medications (-) CAD and (-) Past MI (-) dysrhythmias  Rhythm:Regular Rate:Normal - Systolic murmurs    Neuro/Psych  PSYCHIATRIC DISORDERS Anxiety      Neuromuscular disease    GI/Hepatic ,neg GERD  ,,(+)     (-) substance abuse    Endo/Other  diabetes, Well Controlled    Renal/GU negative Renal ROS     Musculoskeletal   Abdominal   Peds  Hematology   Anesthesia Other Findings Past Medical History: No date: Acute bacterial endocarditis No date: Anxiety No date: Arthritis     Comment:  back No date: Asthma No date: DM (diabetes mellitus), type 2 (HCC) No date: HTN (hypertension) No date: MRSA bacteremia No date: Neuromuscular disorder (HCC)     Comment:  weakness and numbness  No date: Osteomyelitis of lumbar spine (HCC) No date: Osteomyelitis of toe of left foot (HCC) No date: Restless leg syndrome No date: SBO (small bowel obstruction) (HCC)  Reproductive/Obstetrics                             Anesthesia Physical Anesthesia Plan  ASA: 3  Anesthesia Plan: General   Post-op Pain Management: Minimal or no pain anticipated   Induction: Intravenous  PONV Risk Score and Plan: 3 and Propofol  infusion, TIVA and Ondansetron   Airway Management Planned: Nasal  Cannula  Additional Equipment: None  Intra-op Plan:   Post-operative Plan:   Informed Consent: I have reviewed the patients History and Physical, chart, labs and discussed the procedure including the risks, benefits and alternatives for the proposed anesthesia with the patient or authorized representative who has indicated his/her understanding and acceptance.     Dental advisory given  Plan Discussed with: CRNA and Surgeon  Anesthesia Plan Comments: (Discussed risks of anesthesia with patient, including possibility of difficulty with spontaneous ventilation under anesthesia necessitating airway intervention, PONV, and rare risks such as cardiac or respiratory or neurological events, and allergic reactions. Discussed the role of CRNA in patient's perioperative care. Patient understands.)       Anesthesia Quick Evaluation

## 2024-03-03 NOTE — Anesthesia Postprocedure Evaluation (Signed)
 Anesthesia Post Note  Patient: Danielle Harrison  Procedure(s) Performed: EXCISION, METATARSAL BONE, HEAD (Left: Toe)  Patient location during evaluation: PACU Anesthesia Type: General Level of consciousness: awake and alert Pain management: pain level controlled Vital Signs Assessment: post-procedure vital signs reviewed and stable Respiratory status: spontaneous breathing, nonlabored ventilation, respiratory function stable and patient connected to nasal cannula oxygen  Cardiovascular status: blood pressure returned to baseline and stable Postop Assessment: no apparent nausea or vomiting Anesthetic complications: no  No notable events documented.   Last Vitals:  Vitals:   03/03/24 0909 03/03/24 0932  BP: 104/75 105/74  Pulse: 83 82  Resp: 15 15  Temp: 36.7 C 36.8 C  SpO2: 97% 97%    Last Pain:  Vitals:   03/03/24 0932  TempSrc: Temporal  PainSc: 3                  Enrique Harvest

## 2024-03-04 ENCOUNTER — Encounter: Payer: Self-pay | Admitting: Podiatry

## 2024-03-05 ENCOUNTER — Telehealth: Payer: Self-pay | Admitting: Podiatry

## 2024-03-05 NOTE — Telephone Encounter (Signed)
 Spoke to patient @ 11:45 03/05/24. Patient states she is having some active bleeding. Dry brown blood and some bright red blood. She said she was up a loy yesterday and still learning to use her crutches. Advised her not to remove the bandage but to reinforce the active bleeding, stay off it, elevate it and apply ice. If no improvements to contact the office.

## 2024-03-05 NOTE — Telephone Encounter (Signed)
 Patient LMOV stating that she has surgery 03/03/24 and her incision is bleeding through the bandage.

## 2024-03-08 LAB — AEROBIC/ANAEROBIC CULTURE W GRAM STAIN (SURGICAL/DEEP WOUND)
Culture: NO GROWTH
Gram Stain: NONE SEEN

## 2024-03-09 ENCOUNTER — Other Ambulatory Visit: Payer: Self-pay | Admitting: Podiatry

## 2024-03-09 ENCOUNTER — Telehealth: Payer: Self-pay | Admitting: Urology

## 2024-03-09 ENCOUNTER — Other Ambulatory Visit: Payer: Self-pay | Admitting: Orthopedic Surgery

## 2024-03-09 ENCOUNTER — Telehealth: Payer: Self-pay

## 2024-03-09 MED ORDER — OXYCODONE-ACETAMINOPHEN 5-325 MG PO TABS: 1.0000 | ORAL_TABLET | ORAL | 0 refills | Status: DC | PRN

## 2024-03-09 NOTE — Telephone Encounter (Signed)
 She left a message on the general voicemail requesting a refill on her pain meds.

## 2024-03-09 NOTE — Telephone Encounter (Signed)
 Pt is requesting a refill on pain medicine sent to [id number] [place], [place] - [address]

## 2024-03-11 ENCOUNTER — Ambulatory Visit (INDEPENDENT_AMBULATORY_CARE_PROVIDER_SITE_OTHER): Admitting: Podiatry

## 2024-03-11 DIAGNOSIS — M86072 Acute hematogenous osteomyelitis, left ankle and foot: Secondary | ICD-10-CM

## 2024-03-11 DIAGNOSIS — Z9889 Other specified postprocedural states: Secondary | ICD-10-CM

## 2024-03-11 NOTE — Progress Notes (Signed)
  Subjective:  Patient ID: Danielle Harrison, female    DOB: 12/23/1975,  MRN: [id number]  Chief Complaint  Patient presents with   Post-op Follow-up    Patient had some excess bleeding, but not through the outer bandage. Has not been off-loading as she should. Has one crutch with her today. Pain is at an 8/10. Surgical site intact, sutures present as well as some steri-strips.     DOS: 03/03/2024 Procedure: 1. Partial resection of 1st metatarsal and 1st proximal phalanx, Left foot 2. Removal and reinsertion of antibiotic cement spacer non biodegradable implant, left foot  48 y.o. female seen for post op check.  Patient seen for first postop check approximately 1 week status post above procedures.  She has been walking in postop shoe did have some bleeding into the dressing.  Having some pain about 8 out of 10 though seems to be better than preop. finished course of antibiotics postop as instructed  Review of Systems: Negative except as noted in the HPI. Denies N/V/F/Ch.   Objective:   Constitutional Well developed. Well nourished.  Vascular Foot warm and well perfused. Capillary refill normal to all digits.   No calf pain with palpation  Neurologic Normal speech. Oriented to person, place, and time. Epicritic sensation diminished to left foot  Dermatologic Incisions well coapted without drainage dehiscence or evidence of residual infection.  Mild maceration due to some postop bleeding however no active bleeding from the incision at this time  Orthopedic: Status post repeat resection of left first metatarsal and first proximal phalanx with removal and replacement of antibiotic cement spacer with K wire fixation.  Moderate edema of the first ray, mild tenderness palpation   Radiographs: 03/03/2024 postop amputation of portions of the 1st proximal phalanx and 1st metatarsal is demonstrated with a shorter methylmethacrylate block placed with K-wire fixation. Associated soft tissue  swelling  Pathology: None taken 03/03/2024 IntraOp  Micro: 03/03/2024 wound swab culture from around the antibiotic cement spacer no growth   Assessment:   Painful and oversized antibiotics cement spacer status post removal and reinsertion of a smaller spacer with K wire fixation  Plan:  Patient was evaluated and treated and all questions answered.  POD # 8 s/p above procedure with smaller cement spacer placed -Progressing as expected postop, improving with decreased pain and swelling postoperatively compared to prior surgery -Clinically less prominence of the first MPJ -XR: Deferred today -WB Status: Minimal weightbearing to heel only in postop shoe -Sutures: To remain intact. -Medications/ABX: Continue pain meds as needed, no further antibiotics indicated -Dressing: Dressing reapplied today, leave clean dry and intact until next appointment - F/u Plan: For suture removal        Maridee Shoemaker, DPM [facility] / [facility]

## 2024-03-15 ENCOUNTER — Telehealth: Payer: Self-pay

## 2024-03-15 ENCOUNTER — Other Ambulatory Visit: Payer: Self-pay | Admitting: Podiatry

## 2024-03-15 MED ORDER — OXYCODONE-ACETAMINOPHEN 5-325 MG PO TABS: 1.0000 | ORAL_TABLET | ORAL | 0 refills | Status: DC | PRN

## 2024-03-15 NOTE — Telephone Encounter (Signed)
-----   Message from Anabell D sent at 03/15/2024  9:53 AM EDT ----- Can you please see if pt's Oxycodone  5mg  can be refilled. I was just talking to her about rescheduling her appointment on 6/5 to earlier in thje day.    Thank you

## 2024-03-15 NOTE — Progress Notes (Signed)
 Refill sent of percocet

## 2024-03-16 ENCOUNTER — Ambulatory Visit: Attending: Infectious Diseases | Admitting: Infectious Diseases

## 2024-03-16 ENCOUNTER — Encounter: Payer: Self-pay | Admitting: Infectious Diseases

## 2024-03-16 VITALS — BP 111/71 | HR 73 | Temp 97.7°F | Ht 60.0 in | Wt 110.0 lb

## 2024-03-16 DIAGNOSIS — M4644 Discitis, unspecified, thoracic region: Secondary | ICD-10-CM

## 2024-03-16 DIAGNOSIS — Z8719 Personal history of other diseases of the digestive system: Secondary | ICD-10-CM | POA: Insufficient documentation

## 2024-03-16 DIAGNOSIS — R7881 Bacteremia: Secondary | ICD-10-CM | POA: Diagnosis not present

## 2024-03-16 DIAGNOSIS — M4626 Osteomyelitis of vertebra, lumbar region: Secondary | ICD-10-CM | POA: Insufficient documentation

## 2024-03-16 DIAGNOSIS — M4624 Osteomyelitis of vertebra, thoracic region: Secondary | ICD-10-CM

## 2024-03-16 DIAGNOSIS — M545 Low back pain, unspecified: Secondary | ICD-10-CM | POA: Insufficient documentation

## 2024-03-16 DIAGNOSIS — M009 Pyogenic arthritis, unspecified: Secondary | ICD-10-CM | POA: Insufficient documentation

## 2024-03-16 DIAGNOSIS — Z981 Arthrodesis status: Secondary | ICD-10-CM | POA: Diagnosis not present

## 2024-03-16 DIAGNOSIS — F17290 Nicotine dependence, other tobacco product, uncomplicated: Secondary | ICD-10-CM | POA: Insufficient documentation

## 2024-03-16 DIAGNOSIS — A4902 Methicillin resistant Staphylococcus aureus infection, unspecified site: Secondary | ICD-10-CM

## 2024-03-16 DIAGNOSIS — E1169 Type 2 diabetes mellitus with other specified complication: Secondary | ICD-10-CM | POA: Insufficient documentation

## 2024-03-16 DIAGNOSIS — Z8614 Personal history of Methicillin resistant Staphylococcus aureus infection: Secondary | ICD-10-CM | POA: Diagnosis not present

## 2024-03-16 DIAGNOSIS — M00072 Staphylococcal arthritis, left ankle and foot: Secondary | ICD-10-CM

## 2024-03-16 DIAGNOSIS — B9562 Methicillin resistant Staphylococcus aureus infection as the cause of diseases classified elsewhere: Secondary | ICD-10-CM

## 2024-03-16 DIAGNOSIS — Z7984 Long term (current) use of oral hypoglycemic drugs: Secondary | ICD-10-CM | POA: Insufficient documentation

## 2024-03-16 MED ORDER — SULFAMETHOXAZOLE-TRIMETHOPRIM 800-160 MG PO TABS: 1.0000 | ORAL_TABLET | Freq: Two times a day (BID) | ORAL | 3 refills | Status: DC

## 2024-03-16 NOTE — Progress Notes (Signed)
 NAME: Danielle Harrison  DOB: 1976/06/15  MRN: [id number]  Date/Time: 03/16/2024 9:15 AM   Subjective:  Here with her partner ?follow up visit for MRSA infection on bactrim  - pt is getting better On 03/03/24 she underwent Partial resection of 1st metatarsal and 1st proximal phalanx, Left foot 2. Removal and reinsertion of antibiotic cement spacer non biodegradable implant, left foot Saw Dr.Standiford after that. Asked not to WB on that foot but she seems to be walking on it Repeat MRI on the spine done on 03/02/24 Not read by radiology   Complicated infectious history  Danielle Harrison is a 48 y.o. Was in the hospital between 10/18/2023 until 11/04/2023 for acute pancreatitis but also developed MRSA bacteremia 5 days into hospital stay ( BC positive on 1/9, 1/11 and 1/12) and T8-T9 thoracic vertebral osteomyelitis/discitis and left great toe septic arthritis for which she underwent Left foot first MTP joint resection including head of the first metatarsal and base of proximal phalanx.  This was done on 10/30/2023.  Culture was positive for MRSA.  TEE was negative on 10/29/23.  Repeat blood culture done on 10/30/2023 was negative for MRSA.  Patient was discharged on daptomycin  IV for minimum of 6 weeks.  The daptomycin  MIC was susceptible at 0.5 mcg.    She presented to urgent care on 11/16/2023 with muscle spasm in her chest.  No workup was done that day.  She was prescribed prednisone  and heating pad and wrapping of the chest wall.   She saw her PCP on 11/21/2023 and was still complaining of pain and she ordered a thoracic spine x-ray which showed progressive disc space narrowing and wedging of T6 and T7 worrisome for discitis and osteomyelitis and pathological compression fracture.   She came back to [facility] ED on 11/23/2023 complaining of worsening pain and it was hurting for her to talk turn twist or move.She had been 100% adherent to Daptomycin  . MRI showed epidural abscess and nerve compression after 23 days of  IV dapto She underwent surgery on 11/25/23 1. Posterior Segmental Instrumentation T4 to T9 2. Posterolateral arthrodesis from T4 to T9 3. Thoracic lamina to me at T6 and T7 4.  Right sided transpedicular disc debridement and washout of ventral epidural abscess 5.  Autograft harvest spinous processes 6.  Allograft placement Culture was MRSA- sensitive to Dapt MIC<1 She was this time treated with vancomycin  and discharged home on it along with rifampin  . There has been some fluctuation in vanco levels- intially low and vanco was increased to Q8. Cr has been steady She had been adherent to vanco Q8 but has not been sticking to the correct time, so the level of vanco has been high I saw her on 12/30/23 and She was doing much better Pain much improved- some present on the mid back The plan was to finish 8 weeks of Iv vanco and Po rifampin  on 4/7 and do bactrim  and rifampin  for 4-6 weeks. 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 IR aspirated the disc space and spinous process and  culture was neg for MRSA PT was sent on Po Linezolid  and PO rifampin  on 4/9 She called after a few days c/o nausea, vomiting diarrhea and heart burn- rifampin  was discontinued, and so was t=rifampin  and started on Bactrim  She is doing very well Pain much better       Past Medical History:  Diagnosis Date   Acute bacterial endocarditis    Anxiety    Arthritis    back   Asthma    DM (diabetes mellitus), type 2 (HCC)    HTN (hypertension)    MRSA bacteremia     Neuromuscular disorder (HCC)    weakness and numbness    Osteomyelitis of lumbar spine (HCC)    Osteomyelitis of toe of left foot (HCC)    Restless leg syndrome    SBO (small bowel obstruction) (HCC)     Past Surgical History:  Procedure Laterality Date   APPLICATION OF INTRAOPERATIVE CT SCAN N/A 11/25/2023   Procedure: APPLICATION OF INTRAOPERATIVE CT SCAN;  Surgeon: Carroll Clamp, MD;  Location: [facility];  Service: Neurosurgery;  Laterality: N/A;   BIOPSY  06/28/2019   Procedure: BIOPSY;  Surgeon: Irby Mannan, MD;  Location: [facility];  Service: Endoscopy;;   CESAREAN SECTION     x2   COLONOSCOPY WITH PROPOFOL  N/A 06/28/2019   Procedure: COLONOSCOPY WITH PROPOFOL ;  Surgeon: Irby Mannan, MD;  Location: [facility];  Service: Endoscopy;  Laterality: N/A;   ESOPHAGOGASTRODUODENOSCOPY (EGD) WITH PROPOFOL  N/A 06/28/2019   Procedure: ESOPHAGOGASTRODUODENOSCOPY (EGD) WITH PROPOFOL ;  Surgeon: Irby Mannan, MD;  Location: [facility];  Service: Endoscopy;  Laterality: N/A;   INCISION AND DRAINAGE Left 10/27/2023   Procedure: INCISION AND DRAINAGE LEFT FOOT;  Surgeon: Evertt Hoe, DPM;  Location: [facility];  Service: Orthopedics/Podiatry;  Laterality: Left;   INCISION AND DRAINAGE Left 10/30/2023   Procedure: LEFT FOOT FIRST METATARSAL PHALANGEAL JOINT RESECTION, ANTIBIOTIC SPACER, WASHOUT, WOUND CLOSURE;  Surgeon: Evertt Hoe, DPM;  Location: [facility];  Service: Orthopedics/Podiatry;  Laterality: Left;   IR LUMBAR DISC ASPIRATION W/IMG GUIDE  01/19/2024   METATARSAL HEAD EXCISION Left 03/03/2024   Procedure: EXCISION, METATARSAL BONE, HEAD;  Surgeon: Evertt Hoe, DPM;  Location: [facility];  Service: Orthopedics/Podiatry;  Laterality: Left;  LEFT FOOT REMOVAL AND REINSTERTION OF CEMENT SPACER   TEE WITHOUT CARDIOVERSION N/A 10/29/2023   Procedure: TRANSESOPHAGEAL ECHOCARDIOGRAM (TEE);  Surgeon: Devorah Fonder,  MD;  Location: [facility];  Service: Cardiovascular;  Laterality: N/A;   THORACIC LAMINECTOMY FOR EPIDURAL ABSCESS N/A 11/25/2023   Procedure: T6-T7 thoracic laminectomy, right sided transpedicular decompression and disc debridement;  Surgeon: Carroll Clamp, MD;  Location: [facility];  Service: Neurosurgery;  Laterality: N/A;   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   Procedure: LEFT FOOT 1ST MPJ RESECTION;  Surgeon: Evertt Hoe, DPM;  Location: [facility];  Service: Orthopedics/Podiatry;  Laterality: Left;   TRANSMETATARSAL AMPUTATION Left 10/30/2023   Procedure: LEFT FOOT 1ST MPJ RESECTION;  Surgeon: Evertt Hoe, DPM;  Location: [facility];  Service: Orthopedics/Podiatry;  Laterality: Left;    Social History   Socioeconomic History   Marital status: Married    Spouse name: Not on file   Number of children: Not on file   Years of education: Not on file   Highest education level: Not on file  Occupational History   Not on file  Tobacco Use   Smoking status: Former    Current packs/day: 0.50    Average  packs/day: 0.5 packs/day for 6.0 years (3.0 ttl pk-yrs)    Types: Cigarettes   Smokeless tobacco: Never   Tobacco comments:    Quit 8/22  Vaping Use   Vaping status: Every Day   Substances: Nicotine, Flavoring  Substance and Sexual Activity   Alcohol use: Yes    Comment: rare   Drug use: Never   Sexual activity: Not on file  Other Topics Concern   Not on file  Social History Narrative   Not on file   Social Drivers of Health   Financial Resource Strain: Not on file  Food Insecurity: No Food Insecurity (01/15/2024)   Hunger Vital Sign    Worried About Running Out of Food in the Last Year: Never true    Ran Out of Food in the Last Year: Never true  Transportation Needs: No Transportation Needs (01/15/2024)   PRAPARE - Administrator, Civil Service (Medical): No    Lack of Transportation (Non-Medical): No  Physical Activity: Not on file   Stress: Not on file  Social Connections: Not on file  Intimate Partner Violence: Not At Risk (01/15/2024)   Humiliation, Afraid, Rape, and Kick questionnaire    Fear of Current or Ex-Partner: No    Emotionally Abused: No    Physically Abused: No    Sexually Abused: No    Family History  Problem Relation Age of Onset   Colon cancer Neg Hx    Allergies  Allergen Reactions   Diclofenac Hives   I? Current Outpatient Medications  Medication Sig Dispense Refill   albuterol  (PROVENTIL ) (2.5 MG/3ML) 0.083% nebulizer solution Take 2.5 mg by nebulization every 6 (six) hours as needed for wheezing or shortness of breath.     albuterol  (VENTOLIN  HFA) 108 (90 Base) MCG/ACT inhaler Inhale 1 puff into the lungs every 6 (six) hours as needed for wheezing or shortness of breath. (Patient taking differently: Inhale 1 puff into the lungs 4 (four) times daily.) 8 g 3   budesonide -formoterol  (SYMBICORT ) 160-4.5 MCG/ACT inhaler Inhale 2 puffs into the lungs 2 (two) times daily. 1 each 3   gabapentin  (NEURONTIN ) 300 MG capsule Take 1 capsule (300 mg total) by mouth 3 (three) times daily. 90 capsule 2   metFORMIN  (GLUCOPHAGE ) 500 MG tablet Take 1 tablet (500 mg total) by mouth 2 (two) times daily with a meal. 180 tablet 1   methocarbamol  (ROBAXIN ) 500 MG tablet TAKE 1 TABLET BY MOUTH EVERY 6 HOURS AS NEEDED FOR MUSCLE SPASMS. 120 tablet 0   metoprolol  tartrate (LOPRESSOR ) 100 MG tablet Take 1 tablet (100 mg total) by mouth 2 (two) times daily. 180 tablet 1   oxyCODONE -acetaminophen  (PERCOCET) 5-325 MG tablet Take 1 tablet by mouth every 4 (four) hours as needed for severe pain (pain score 7-10). 30 tablet 0   sulfamethoxazole -trimethoprim  (BACTRIM  DS) 800-160 MG tablet Take 1 tablet by mouth 2 (two) times daily.     No current facility-administered medications for this visit.     Abtx:  Anti-infectives (From admission, onward)    None       REVIEW OF SYSTEMS:  Const: negative fever, negative  chills, negative weight loss Eyes: negative diplopia or visual changes, negative eye pain ENT: negative coryza, negative sore throat Resp: negative cough, hemoptysis, dyspnea Cards: negative for chest pain, palpitations, lower extremity edema GU: negative for frequency, dysuria and hematuria GI: Negative for abdominal pain, diarrhea, bleeding, constipation Skin: negative for rash and pruritus Heme: negative for easy bruising and gum/nose bleeding  MS: back pain  Neurolo:negative for headaches, dizziness, vertigo, memory problems  Psych: negative for feelings of anxiety, depression  Endocrine: negative for thyroid, diabetes Allergy/Immunology- diclofenac Objective:  VITALS:  BP 111/71   Pulse 73   Temp 97.7 F (36.5 C) (Temporal)   Ht 5' (1.524 m)   Wt 110 lb (49.9 kg)   LMP 02/11/2024 (Exact Date)   SpO2 96%   BMI 21.48 kg/m   LDA PICC PHYSICAL EXAM:  General: Alert, cooperative, no distress, appears stated age.  Head: Normocephalic, without obvious abnormality, atraumatic. Eyes: Conjunctivae clear, anicteric sclerae. Pupils are equal ENT Nares normal. No drainage or sinus tenderness. Lips, mucosa, and tongue normal. No Thrush Neck: Supple, symmetrical, no adenopathy, thyroid: non tender no carotid bruit and no JVD. Back: thoracic scar healed well Lungs: Clear to auscultation bilaterally. No Wheezing or Rhonchi. No rales. Heart: Regular rate and rhythm, no murmur, rub or gallop. Abdomen: Soft, non-tender,not distended. Bowel sounds normal. No masses Extremities:left foot dressing not removed Skin: No rashes or lesions. Or bruising Lymph: Cervical, supraclavicular normal. Neurologic: Grossly non-focal Pertinent Labs Will do labs today   ? Impression/Recommendation ?MRSA bacteremia, Extensive spinal infection, left great toe septic arthritis- s/p thoracic spine fusion ?MRSA bacteremia in Jan 2025 with T6-T7 discitis  and left great toe septic arthritis and  osteo Shewas  on appropriate antibiotic after  23 days of Iv daptomycin  there was progression of the vertebral osteomyelitis  with epidural abscess and nerve compression causing severe pain. She underwent surgery on 11/25/23 1. Posterior Segmental Instrumentation T4 to T9 2. Posterolateral arthrodesis from T4 to T9 3. Thoracic lamina to me at T6 and T7 4.  Right sided transpedicular disc debridement and washout of ventral epidural abscess 5.  Autograft harvest spinous processes 6.  Allograft placement  culture 11/25/23 was MRSA ( susceptible to vanco and dapto)  She was started on vancomycin  And  rifampin   Sed rate /crp normalized While on the 8th week of Antibiotic she was readmitted for lumbar pain  and MRI showed L1-L2 discitis and osteo and L3 spinous process involvement She underwent aspiration from the disc space and spinous process by IR  on 01/19/24 and the culture combined was no growth She was sent home on 4/9 on linezolid  and rifampin  Both the antibotics stopped due to side effects  'been on bactrim  5/1 and doing well Plan is to give curative treatment for atleast a year because of hardware and MRSA- may be indefinitely Called radiology to get report of MRI   She has baseline lumbar deg disc disease and is followed by pain management at [place]      Left great toe infection with septic arthritis and osteo of the Met- s/p resection- site has healed well Bony enlargement and was painful and podiatrist did further resection and decreased the size of the spacer   H/o pancreatitis  DM on metformin    Will do labs today   Discussed the management with the patient and her partner Follow up 2 months

## 2024-03-16 NOTE — Patient Instructions (Signed)
 Continue to take bactrim  antibiotic 1 tablet twice a day- You may need this antibiotic for atleast a year if not indefinitely because of MRSA infection in the spine and having hardware. Will call you once we have MRI result Follow up 2 months

## 2024-03-18 ENCOUNTER — Ambulatory Visit (INDEPENDENT_AMBULATORY_CARE_PROVIDER_SITE_OTHER): Admitting: Podiatry

## 2024-03-18 DIAGNOSIS — Z9889 Other specified postprocedural states: Secondary | ICD-10-CM

## 2024-03-18 DIAGNOSIS — M86072 Acute hematogenous osteomyelitis, left ankle and foot: Secondary | ICD-10-CM

## 2024-03-18 NOTE — Progress Notes (Signed)
  Subjective:  Patient ID: Danielle Harrison, female    DOB: May 22, 1976,  MRN: [id number]  Chief Complaint  Patient presents with   Post-op Follow-up    Denies pain. Some drainage on dressing. Has not been staying off foot much. Sutures intact. No swelling.     DOS: 03/03/2024 Procedure: 1. Partial resection of 1st metatarsal and 1st proximal phalanx, Left foot 2. Removal and reinsertion of antibiotic cement spacer non biodegradable implant, left foot  48 y.o. female seen for post op check.  Patient seen for first postop check approximately 2 week status post above procedures.  She has been walking in postop shoe does report some drainage in the dressing but overall says pain is much improved from preoperative happy with result.  Review of Systems: Negative except as noted in the HPI. Denies N/V/F/Ch.   Objective:   Constitutional Well developed. Well nourished.  Vascular Foot warm and well perfused. Capillary refill normal to all digits.   No calf pain with palpation  Neurologic Normal speech. Oriented to person, place, and time. Epicritic sensation diminished to left foot  Dermatologic Incisions well coapted without drainage dehiscence or evidence of residual infection.  Mild maceration due to residual serosanguineous drainage  Orthopedic: Status post repeat resection of left first metatarsal and first proximal phalanx with removal and replacement of antibiotic cement spacer with K wire fixation.  Decreasing edema of the first ray, mild tenderness palpation   Radiographs: 03/03/2024 postop amputation of portions of the 1st proximal phalanx and 1st metatarsal is demonstrated with a shorter methylmethacrylate block placed with K-wire fixation. Associated soft tissue swelling  Pathology: None taken 03/03/2024 IntraOp  Micro: 03/03/2024 wound swab culture from around the antibiotic cement spacer no growth   Assessment:   Painful and oversized antibiotics cement spacer status post  removal and reinsertion of a smaller spacer with K wire fixation  Plan:  Patient was evaluated and treated and all questions answered.  2 weeks s/p above procedure with smaller cement spacer placed -Progressing as expected postop, improving with decreased pain and swelling postoperatively.  No dehiscence or concern for residual infection there is some drainage at the surgical site however -Clinically less prominence of the first MPJ -XR: Deferred today -WB Status: Minimal weightbearing to heel only in postop shoe -Sutures: Removed in total today -Medications/ABX: Continue pain meds as needed, no further antibiotics indicated -Dressing: Dressing reapplied today, changing every 2 to 3 days as needed for drainage with Betadine slide to the incision followed by 4 x 4 gauze Kerlix roll Ace wrap - F/u Plan: Follow-up in 2 weeks for recheck        Maridee Shoemaker, DPM [facility] / [facility]

## 2024-03-22 ENCOUNTER — Other Ambulatory Visit
Admission: RE | Admit: 2024-03-22 | Discharge: 2024-03-22 | Disposition: A | Discharge status: Home / Self Care | Attending: Infectious Diseases | Admitting: Infectious Diseases

## 2024-03-22 ENCOUNTER — Telehealth: Payer: Self-pay | Admitting: Podiatry

## 2024-03-22 ENCOUNTER — Telehealth: Payer: Self-pay

## 2024-03-22 DIAGNOSIS — A4902 Methicillin resistant Staphylococcus aureus infection, unspecified site: Secondary | ICD-10-CM | POA: Insufficient documentation

## 2024-03-22 LAB — COMPREHENSIVE METABOLIC PANEL WITH GFR
ALT: 23 U/L (ref 0–44)
AST: 17 U/L (ref 15–41)
Albumin: 4.3 g/dL (ref 3.5–5.0)
Alkaline Phosphatase: 110 U/L (ref 38–126)
Anion gap: 11 (ref 5–15)
BUN: 25 mg/dL — ABNORMAL HIGH (ref 6–20)
CO2: 22 mmol/L (ref 22–32)
Calcium: 9.6 mg/dL (ref 8.9–10.3)
Chloride: 104 mmol/L (ref 98–111)
Creatinine, Ser: 0.69 mg/dL (ref 0.44–1.00)
GFR, Estimated: >60 mL/min (ref >60)
Glucose, Bld: 164 mg/dL — ABNORMAL HIGH (ref 70–99)
Potassium: 4.6 mmol/L (ref 3.5–5.1)
Sodium: 137 mmol/L (ref 135–145)
Total Bilirubin: 0.4 mg/dL (ref 0.0–1.2)
Total Protein: 7.6 g/dL (ref 6.5–8.1)

## 2024-03-22 LAB — CBC WITH DIFFERENTIAL/PLATELET
Abs Immature Granulocytes: 0.03 10*3/uL (ref 0.00–0.07)
Basophils Absolute: 0.1 10*3/uL (ref 0.0–0.1)
Basophils Relative: 1 %
Eosinophils Absolute: 0.5 10*3/uL (ref 0.0–0.5)
Eosinophils Relative: 7 %
HCT: 35.1 % — ABNORMAL LOW (ref 36.0–46.0)
Hemoglobin: 11.3 g/dL — ABNORMAL LOW (ref 12.0–15.0)
Immature Granulocytes: 1 %
Lymphocytes Relative: 43 %
Lymphs Abs: 3 10*3/uL (ref 0.7–4.0)
MCH: 29.9 pg (ref 26.0–34.0)
MCHC: 32.2 g/dL (ref 30.0–36.0)
MCV: 92.9 fL (ref 80.0–100.0)
Monocytes Absolute: 0.6 10*3/uL (ref 0.1–1.0)
Monocytes Relative: 10 %
Neutro Abs: 2.5 10*3/uL (ref 1.7–7.7)
Neutrophils Relative %: 38 %
Platelets: 375 10*3/uL (ref 150–400)
RBC: 3.78 MIL/uL — ABNORMAL LOW (ref 3.87–5.11)
RDW: 14.4 % (ref 11.5–15.5)
WBC: 6.6 10*3/uL (ref 4.0–10.5)
nRBC: 0 % (ref 0.0–0.2)

## 2024-03-22 NOTE — Telephone Encounter (Signed)
-----   Message from Alica Inks sent at 03/22/2024 10:11 AM EDT ----- Regarding: Spine MRsi Please let her know that MRI lumbar spine is slowly improving. She has to continue bactrim . Thx

## 2024-03-22 NOTE — Telephone Encounter (Signed)
 Patient informed and verbalized understanding

## 2024-03-22 NOTE — Telephone Encounter (Signed)
 Danielle Harrison called to request a refill on oxyCODONE -acetaminophen  (PERCOCET) 5-325 MG tablet . She also stated her incision is oozing and painful. I scheduled her an appointment for 03/23/2024 but she would like to know if it needs to be covered.

## 2024-03-23 ENCOUNTER — Ambulatory Visit (INDEPENDENT_AMBULATORY_CARE_PROVIDER_SITE_OTHER): Admitting: Podiatry

## 2024-03-23 ENCOUNTER — Other Ambulatory Visit

## 2024-03-23 ENCOUNTER — Other Ambulatory Visit: Payer: Self-pay

## 2024-03-23 DIAGNOSIS — I10 Essential (primary) hypertension: Secondary | ICD-10-CM

## 2024-03-23 DIAGNOSIS — Z9889 Other specified postprocedural states: Secondary | ICD-10-CM

## 2024-03-23 DIAGNOSIS — Z13220 Encounter for screening for lipoid disorders: Secondary | ICD-10-CM

## 2024-03-23 DIAGNOSIS — E119 Type 2 diabetes mellitus without complications: Secondary | ICD-10-CM | POA: Diagnosis not present

## 2024-03-23 DIAGNOSIS — M86072 Acute hematogenous osteomyelitis, left ankle and foot: Secondary | ICD-10-CM

## 2024-03-23 MED ORDER — OXYCODONE-ACETAMINOPHEN 5-325 MG PO TABS: 1.0000 | ORAL_TABLET | ORAL | 0 refills | Status: AC | PRN

## 2024-03-23 NOTE — Telephone Encounter (Signed)
Patient is being seen in office today

## 2024-03-23 NOTE — Progress Notes (Signed)
  Subjective:  Patient ID: Danielle Harrison, female    DOB: July 13, 1976,  MRN: [id number]  Chief Complaint  Patient presents with   Routine Post Op    surgery foot oozing green/yellow and painful, Acute hematogenous osteomyelitis of left foot Foot look well healing ok    DOS: 03/03/2024 Procedure: 1. Partial resection of 1st metatarsal and 1st proximal phalanx, Left foot 2. Removal and reinsertion of antibiotic cement spacer non biodegradable implant, left foot  48 y.o. female seen for post op check.  Patient seen for first postop check approximately 3 week status post above procedures.   Patient reports she noticed some oozing green-yellow drainage from the incision site and it is painful so she called to get in for appointment.  Review of Systems: Negative except as noted in the HPI. Denies N/V/F/Ch.   Objective:   Constitutional Well developed. Well nourished.  Vascular Foot warm and well perfused. Capillary refill normal to all digits.   No calf pain with palpation  Neurologic Normal speech. Oriented to person, place, and time. Epicritic sensation diminished to left foot  Dermatologic Increase edema about the first MPJ today.  There is more tenderness to palpation.  There is some very small area of superficial dehiscence at the proximal aspect of the incision that probes to subcutaneous fat tissue.  Mild erythema but no significant evidence of deep infection   Orthopedic: Status post repeat resection of left first metatarsal and first proximal phalanx with removal and replacement of antibiotic cement spacer with K wire fixation.  Decreasing edema of the first ray, mild tenderness palpation   Radiographs: 03/03/2024 postop amputation of portions of the 1st proximal phalanx and 1st metatarsal is demonstrated with a shorter methylmethacrylate block placed with K-wire fixation. Associated soft tissue swelling  Pathology: None taken 03/03/2024 IntraOp  Micro: 03/03/2024 wound swab  culture from around the antibiotic cement spacer no growth   Assessment:   Painful and oversized antibiotics cement spacer status post removal and reinsertion of a smaller spacer with K wire fixation  Plan:  Patient was evaluated and treated and all questions answered.  3 weeks s/p above procedure with smaller cement spacer placed -Progressing as expected postop, improving with decreased pain and swelling postoperatively.  Small superficial dehiscence at the proximal aspect of the incision.  Debrided this area slightly with tissue nipper to healthy base with some mild fibrotic tissue. -Recommend continued wound care and compression therapy as well as ice therapy for edema control -Continue on Bactrim  which she is taking for a year for her back which should cover her foot as well. -XR: Deferred today -WB Status: Minimal weightbearing to heel only in postop shoe -Sutures: Removed a deep Monocryl suture that had partially extruded through the dehiscence -Medications/ABX: E Rx for Percocet 5 mg x 30 -Dressing: Dressing reapplied today, changing daily until next appointment or drainage with Betadine  to the incision followed by 4 x 4 gauze Kerlix roll Ace wrap - F/u Plan: Follow-up next week Thursday as previously scheduled        Maridee Shoemaker, DPM [facility] / [facility]

## 2024-03-24 ENCOUNTER — Ambulatory Visit: Payer: Self-pay | Admitting: Cardiology

## 2024-03-24 LAB — CMP14+EGFR
ALT: 22 IU/L (ref 0–32)
AST: 13 IU/L (ref 0–40)
Albumin: 4.4 g/dL (ref 3.9–4.9)
Alkaline Phosphatase: 138 IU/L — ABNORMAL HIGH (ref 44–121)
BUN/Creatinine Ratio: 38 — ABNORMAL HIGH (ref 9–23)
BUN: 21 mg/dL (ref 6–24)
Bilirubin Total: <0.2 mg/dL (ref 0.0–1.2)
CO2: 17 mmol/L — ABNORMAL LOW (ref 20–29)
Calcium: 10.2 mg/dL (ref 8.7–10.2)
Chloride: 102 mmol/L (ref 96–106)
Creatinine, Ser: 0.56 mg/dL — ABNORMAL LOW (ref 0.57–1.00)
Globulin, Total: 2.7 g/dL (ref 1.5–4.5)
Glucose: 126 mg/dL — ABNORMAL HIGH (ref 70–99)
Potassium: 4.9 mmol/L (ref 3.5–5.2)
Sodium: 136 mmol/L (ref 134–144)
Total Protein: 7.1 g/dL (ref 6.0–8.5)
eGFR: 113 mL/min/{1.73_m2} (ref >59)

## 2024-03-24 LAB — LIPID PANEL
Chol/HDL Ratio: 3.7 ratio (ref 0.0–4.4)
Cholesterol, Total: 202 mg/dL — ABNORMAL HIGH (ref 100–199)
HDL: 54 mg/dL (ref >39)
LDL Chol Calc (NIH): 105 mg/dL — ABNORMAL HIGH (ref 0–99)
Triglycerides: 252 mg/dL — ABNORMAL HIGH (ref 0–149)
VLDL Cholesterol Cal: 43 mg/dL — ABNORMAL HIGH (ref 5–40)

## 2024-03-24 LAB — HEMOGLOBIN A1C
Est. average glucose Bld gHb Est-mCnc: 163 mg/dL
Hgb A1c MFr Bld: 7.3 % — ABNORMAL HIGH (ref 4.8–5.6)

## 2024-03-24 LAB — TSH: TSH: 1.84 u[IU]/mL (ref 0.450–4.500)

## 2024-03-25 ENCOUNTER — Ambulatory Visit: Payer: Self-pay

## 2024-03-30 ENCOUNTER — Other Ambulatory Visit: Payer: Self-pay | Admitting: Podiatry

## 2024-03-30 ENCOUNTER — Telehealth: Payer: Self-pay | Admitting: Podiatry

## 2024-03-30 MED ORDER — OXYCODONE-ACETAMINOPHEN 5-325 MG PO TABS: 1.0000 | ORAL_TABLET | ORAL | 0 refills | Status: DC | PRN

## 2024-03-30 NOTE — Telephone Encounter (Signed)
 Req'ing refill for: oxyCODONE -acetaminophen  (PERCOCET) 5-325 MG tablet

## 2024-03-31 ENCOUNTER — Other Ambulatory Visit: Payer: Self-pay | Admitting: Cardiology

## 2024-04-01 ENCOUNTER — Ambulatory Visit (INDEPENDENT_AMBULATORY_CARE_PROVIDER_SITE_OTHER): Admitting: Podiatry

## 2024-04-01 DIAGNOSIS — M86072 Acute hematogenous osteomyelitis, left ankle and foot: Secondary | ICD-10-CM

## 2024-04-01 DIAGNOSIS — Z9889 Other specified postprocedural states: Secondary | ICD-10-CM

## 2024-04-01 NOTE — Progress Notes (Signed)
  Subjective:  Patient ID: Danielle Harrison, female    DOB: 24-Mar-1976,  MRN: [id number]  Chief Complaint  Patient presents with   Post-op Follow-up    Pain level has decreased to a 6-7/10, swelling has decreased some, oozing has stopped after removal of remaining suture last week. Reports being able to wear sandals with adjustable straps but no tennis shoes yet.     DOS: 03/03/2024 Procedure: 1. Partial resection of 1st metatarsal and 1st proximal phalanx, Left foot 2. Removal and reinsertion of antibiotic cement spacer non biodegradable implant, left foot  48 y.o. female seen for post op check.  Patient seen for first postop check approximately 4 week status post above procedures.    Reports the drainage has stopped and she is back to wearing postop shoe and walking as tolerated and wearing a sandal which is okay.  Reports decrease swelling and feels better than last visit.  Review of Systems: Negative except as noted in the HPI. Denies N/V/F/Ch.   Objective:   Constitutional Well developed. Well nourished.  Vascular Foot warm and well perfused. Capillary refill normal to all digits.   No calf pain with palpation  Neurologic Normal speech. Oriented to person, place, and time. Epicritic sensation diminished to left foot  Dermatologic Decreased edema about the first MPJ today.  Decreased tenderness to palpation.  Improved from prior small superficial incisional wound without evidence of infection    Orthopedic: Status post repeat resection of left first metatarsal and first proximal phalanx with removal and replacement of antibiotic cement spacer with K wire fixation.  Decreasing edema of the first ray, mild tenderness palpation   Radiographs: 03/03/2024 postop amputation of portions of the 1st proximal phalanx and 1st metatarsal is demonstrated with a shorter methylmethacrylate block placed with K-wire fixation. Associated soft tissue swelling  Pathology: None taken 03/03/2024  IntraOp  Micro: 03/03/2024 wound swab culture from around the antibiotic cement spacer no growth   Assessment:   Painful and oversized antibiotics cement spacer status post removal and reinsertion of a smaller spacer with K wire fixation  Plan:  Patient was evaluated and treated and all questions answered.  4 weeks s/p above procedure with smaller cement spacer placed -Progressing as expected postop, improving with decreased swelling and pain at this visit improved from last visit no more drainage. -Recommend continued wound care with antibiotic ointment along the incision line and Band-Aid for small superficial wound until fully healed and compression therapy as well as ice therapy for edema control -Continue on Bactrim  which she is taking for a year for her back which should cover her foot as well. -XR: Deferred today -WB Status: Weightbearing as tolerated in postop shoe for sandal as tolerated -Sutures: Previously removed -Medications/ABX: No further pain medications needed -Dressing: Dressing reapplied today and with antibiotic ointment and Band-Aid dressing.  Continue Band-Aid dressing until the small wound on the incision line is fully healed.  This will be approximately 1-2 more weeks - F/u Plan: Follow-up in 4 weeks        Maridee Shoemaker, DPM [facility] / [facility]

## 2024-04-05 ENCOUNTER — Other Ambulatory Visit: Payer: Self-pay | Admitting: Podiatry

## 2024-04-05 ENCOUNTER — Telehealth: Payer: Self-pay | Admitting: Podiatry

## 2024-04-05 MED ORDER — OXYCODONE-ACETAMINOPHEN 5-325 MG PO TABS: 1.0000 | ORAL_TABLET | ORAL | 0 refills | Status: DC | PRN

## 2024-04-05 NOTE — Telephone Encounter (Signed)
 Patient is requesting refill for Oxycodone -Acetaminophen  (Percocet) 5-325 MG Tablet

## 2024-04-12 ENCOUNTER — Encounter: Payer: Self-pay | Admitting: Pain Medicine

## 2024-04-12 ENCOUNTER — Other Ambulatory Visit: Payer: Self-pay | Admitting: Podiatry

## 2024-04-12 ENCOUNTER — Ambulatory Visit (HOSPITAL_BASED_OUTPATIENT_CLINIC_OR_DEPARTMENT_OTHER): Admitting: Pain Medicine

## 2024-04-12 ENCOUNTER — Telehealth: Payer: Self-pay | Admitting: Podiatry

## 2024-04-12 ENCOUNTER — Ambulatory Visit: Admission: RE | Admit: 2024-04-12 | Source: Home / Self Care

## 2024-04-12 ENCOUNTER — Ambulatory Visit
Admission: RE | Admit: 2024-04-12 | Discharge: 2024-04-12 | Disposition: A | Discharge status: Home / Self Care | Source: Ambulatory Visit | Attending: Pain Medicine | Admitting: Pain Medicine

## 2024-04-12 VITALS — BP 123/86 | HR 101 | Resp 18 | Ht 60.0 in | Wt 115.0 lb

## 2024-04-12 DIAGNOSIS — G373 Acute transverse myelitis in demyelinating disease of central nervous system: Secondary | ICD-10-CM | POA: Diagnosis not present

## 2024-04-12 DIAGNOSIS — G894 Chronic pain syndrome: Secondary | ICD-10-CM | POA: Diagnosis not present

## 2024-04-12 DIAGNOSIS — M899 Disorder of bone, unspecified: Secondary | ICD-10-CM

## 2024-04-12 DIAGNOSIS — M4624 Osteomyelitis of vertebra, thoracic region: Secondary | ICD-10-CM | POA: Diagnosis not present

## 2024-04-12 DIAGNOSIS — M549 Dorsalgia, unspecified: Secondary | ICD-10-CM | POA: Insufficient documentation

## 2024-04-12 DIAGNOSIS — Z789 Other specified health status: Secondary | ICD-10-CM | POA: Diagnosis not present

## 2024-04-12 DIAGNOSIS — G8929 Other chronic pain: Secondary | ICD-10-CM | POA: Diagnosis not present

## 2024-04-12 DIAGNOSIS — A4902 Methicillin resistant Staphylococcus aureus infection, unspecified site: Secondary | ICD-10-CM | POA: Insufficient documentation

## 2024-04-12 DIAGNOSIS — M4626 Osteomyelitis of vertebra, lumbar region: Secondary | ICD-10-CM | POA: Diagnosis not present

## 2024-04-12 DIAGNOSIS — M546 Pain in thoracic spine: Secondary | ICD-10-CM | POA: Insufficient documentation

## 2024-04-12 DIAGNOSIS — M4646 Discitis, unspecified, lumbar region: Secondary | ICD-10-CM

## 2024-04-12 DIAGNOSIS — Z9889 Other specified postprocedural states: Secondary | ICD-10-CM | POA: Insufficient documentation

## 2024-04-12 DIAGNOSIS — M8448XA Pathological fracture, other site, initial encounter for fracture: Secondary | ICD-10-CM

## 2024-04-12 DIAGNOSIS — M545 Low back pain, unspecified: Secondary | ICD-10-CM | POA: Diagnosis not present

## 2024-04-12 DIAGNOSIS — M5136 Other intervertebral disc degeneration, lumbar region with discogenic back pain only: Secondary | ICD-10-CM | POA: Diagnosis not present

## 2024-04-12 DIAGNOSIS — M48061 Spinal stenosis, lumbar region without neurogenic claudication: Secondary | ICD-10-CM | POA: Diagnosis not present

## 2024-04-12 DIAGNOSIS — M79672 Pain in left foot: Secondary | ICD-10-CM | POA: Diagnosis not present

## 2024-04-12 DIAGNOSIS — M47816 Spondylosis without myelopathy or radiculopathy, lumbar region: Secondary | ICD-10-CM | POA: Diagnosis not present

## 2024-04-12 DIAGNOSIS — Z79899 Other long term (current) drug therapy: Secondary | ICD-10-CM | POA: Diagnosis not present

## 2024-04-12 DIAGNOSIS — M4316 Spondylolisthesis, lumbar region: Secondary | ICD-10-CM | POA: Diagnosis not present

## 2024-04-12 DIAGNOSIS — G062 Extradural and subdural abscess, unspecified: Secondary | ICD-10-CM | POA: Insufficient documentation

## 2024-04-12 MED ORDER — OXYCODONE-ACETAMINOPHEN 5-325 MG PO TABS: 1.0000 | ORAL_TABLET | ORAL | 0 refills | Status: DC | PRN

## 2024-04-12 NOTE — Progress Notes (Signed)
 PROVIDER NOTE: Interpretation of information contained herein should be left to medically-trained personnel. Specific patient instructions are provided elsewhere under Patient Instructions section of medical record. This document was created in part using AI and STT-dictation technology, any transcriptional errors that may result from this process are unintentional.  Patient: Danielle Harrison  Service: E/M Encounter  Provider: Eric DELENA Como, MD  DOB: 17-May-1976  Delivery: Face-to-face  Specialty: Interventional Pain Management  MRN: [id number]  Setting: Ambulatory outpatient facility  Specialty designation: 09  Type: New Patient  Location: Outpatient office facility  PCP: Carin Gauze, NP  DOS: 04/12/2024    Referring Prov.: Ulis Bottcher, PA-C   Primary Reason(s) for Visit: Encounter for initial evaluation of one or more chronic problems (new to examiner) potentially causing chronic pain, and posing a threat to normal musculoskeletal function. (Level of risk: High) CC: Back Pain (thoracic) and Foot Pain (right)  HPI  Danielle Harrison is a 48 y.o. year old, female patient, who comes for the first time to our practice referred by Ulis Bottcher, PA-C for our initial evaluation of her chronic pain. She has Stomach ache; Diarrhea; Stomach irritation; Acute pancreatitis; Asthma; Abdominal pain; Small bowel intussusception (HCC); Pancreatitis; Septic arthritis of left foot (HCC); Acute hematogenous osteomyelitis of left foot (HCC); Abscess of left foot; MRSA bacteremia; Osteomyelitis of thoracic spine (HCC); Acute bacterial endocarditis; Acute osteomyelitis of left foot (HCC); Acute bilateral back pain; HTN (hypertension); Hyperkalemia; Epidural abscess; Acute osteomyelitis of thoracic spine (HCC); Pathologic fracture of thoracic vertebrae, initial encounter; MRSA infection; Type 2 diabetes mellitus without complication, without long-term current use of insulin  (HCC); Acute osteomyelitis of lumbar spine  (HCC); Osteomyelitis (HCC); Discitis of lumbar region; Osteomyelitis of lumbar spine (HCC); Abnormal MRI, thoracic spine (11/23/2023); Abnormal MRI, lumbar spine (01/15/2024); Pharmacologic therapy; Disorder of skeletal system; Problems influencing health status; Chronic pain syndrome; Chronic thoracic back pain (1ry area of Pain) (Right); Back pain with history of spinal surgery; Chronic low back pain (3ry area of Pain) (Bilateral) w/o sciatica; Chronic foot pain (2ry area of Pain) (Left); and History of idiopathic transverse myelitis (HCC) on their problem list. Today she comes in for evaluation of her Back Pain (thoracic) and Foot Pain (right)  Pain Assessment: Location: Upper, Right Back Radiating: radiates into lower back Onset: More than a month ago Duration: Chronic pain Quality: Other (Comment) (all of the above) Severity: 8 /10 (subjective, self-reported pain score)  Effect on ADL: Linmits activities Timing: Intermittent Modifying factors: sitting BP: 123/86  HR: (!) 101  Onset and Duration: Sudden Cause of pain: infection  Severity: Getting better, NAS-11 at its worse: 8/10, NAS-11 at its best: 5/10, NAS-11 now: 7/10, and NAS-11 on the average: 5/10 Timing: Morning, Afternoon, Night, and After activity or exercise Aggravating Factors: Bending, Kneeling, Lifiting, Prolonged sitting, Prolonged standing, Squatting, Stooping , Walking, Walking uphill, and Walking downhill Alleviating Factors: Stretching, Cold packs, Lying down, Medications, Resting, and Warm showers or baths Associated Problems: Day-time cramps, Night-time cramps, Impotence, Inability to concentrate, Numbness, Spasms, Sweating, Swelling, Tingling, Weakness, and Pain that wakes patient up Quality of Pain: Aching, Agonizing, Annoying, Burning, Constant, Intermittent, Cramping, Cruel, Deep, Disabling, Distressing, Dreadful, Dull, Exhausting, Fearful, Feeling of constriction, Feeling of weight, Getting longer, Getting  shorter, Heavy, Horrible, Hot, Itching, Lancinating, Nagging, Numb, Pressure-like, Pulsating, Punishing, Sharp, Shooting, Sickening, Splitting, Stabbing, Superficial, Tender, Throbbing, Tingling, Tiring, Toothache-like, Uncomfortable, and Work related Previous Examinations or Tests: CT scan, Endoscopy, MRI scan, and X-rays Previous Treatments: Epidural steroid injections and Narcotic medications  Ms.  Harrison is being evaluated for possible interventional pain management therapies for the treatment of her chronic pain.  Discussed the use of AI scribe software for clinical note transcription with the patient, who gave verbal consent to proceed.  History of Present Illness   Danielle Harrison is a 48 year old female with chronic back pain and transverse myelitis who presents with worsening back pain post-surgery. She was referred by Dr. Fleeta for evaluation of her chronic back pain.  She has experienced chronic back pain since 2010 following a diagnosis of transverse myelitis, which initially caused paralysis and numbness from the waist down, affecting her ability to walk. She has been treated with pain medications, including oxycodone  and morphine , and has received epidural steroid injections, which provided temporary relief.  Recently, she underwent back surgery due to a MRSA infection in her spine, discovered after severe back pain post-hospitalization for pancreatitis. The surgery involved the placement of screws in her back. Post-surgery, she experiences a burning sensation and muscle pulling in the upper right thoracic region, describing the pain as 'awful' and 'burning'.  She also has a history of MRSA infection in her left foot, leading to two surgeries to remove infected bone and insert a concrete spacer. The first surgery was followed by a second due to the initial spacer being too large. She experiences dull, burning pain in the left foot, particularly around the ball and big toe, making walking  painful.  Her current pain management regimen includes oxycodone  10 mg, gabapentin  100 mg three times daily, and Percocet 5 mg as needed, up to four times daily. She also uses Advil and occasionally Tylenol  for additional pain relief. She has previously used a TENS unit for lower back pain.  She has type 2 diabetes, diagnosed approximately six months ago, managed with oral medication. No known liver or kidney issues and has not undergone nerve conduction tests for her legs. No current leg pain, numbness, or weakness, although she experiences daily numbness from the waist down. No side effects from her current medications.      Danielle Harrison has been informed that this initial visit was an evaluation only.  On the follow up appointment I will go over the results, including ordered tests and available interventional therapies. At that time she will have the opportunity to decide whether to proceed with offered therapies or not. In the event that Danielle Harrison prefers avoiding interventional options, this will conclude our involvement in the case.  Medication management recommendations may be provided upon request.  Patient informed that diagnostic tests may be ordered to assist in identifying underlying causes, narrow the list of differential diagnoses and aid in determining candidacy for (or contraindications to) planned therapeutic interventions.  Historic Controlled Substance Pharmacotherapy Review PMP and historical list of controlled substances: Oxycodone /APAP 5/325, 1 tab p.o. every 4 hours (6/day) (# 30) (last filled on 04/05/2024): Gabapentin  300 mg, 1 tab p.o. 3 times daily (# 90) (last filled on 03/09/2024); oxycodone  IR 5 mg tablet, 1 tab p.o. every 4 hours (# 30) (last filled on 01/21/2024) Most recently prescribed controlled substance(s): Opioid Analgesic: Oxycodone /APAP 5/325, 1 tab p.o. every 4 hours (6/day) (# 30) (last filled on 04/05/2024) MME/day: 45 mg/day  Historical Monitoring: The patient   reports no history of drug use. List of prior UDS Testing: Lab Results  Component Value Date   MDMA NEGATIVE 03/01/2014   COCAINSCRNUR NEGATIVE 03/01/2014   PCPSCRNUR NEGATIVE 03/01/2014   THCU POSITIVE 03/01/2014   Historical Background Evaluation:  [place] PMP: PDMP reviewed during this encounter. Review of the past 95-months conducted.             PMP NARX Score Report:  Narcotic: 370 Sedative: 180 Stimulant: 000 [place] Department of public safety, offender search: Engineer, mining Information) Non-contributory Risk Assessment Profile: Aberrant behavior: None observed or detected today Risk factors for fatal opioid overdose: None identified today PMP NARX Overdose Risk Score: 400 Fatal overdose hazard ratio (HR): Calculation deferred Non-fatal overdose hazard ratio (HR): Calculation deferred Risk of opioid abuse or dependence: 0.7-3.0% with doses <= 36 MME/day and 6.1-26% with doses >= 120 MME/day. Substance use disorder (SUD) risk level: See below Personal History of Substance Abuse (SUD-Substance use disorder):  Alcohol: Negative  Illegal Drugs: Negative  Rx Drugs: Negative  ORT Risk Level calculation: Low Risk  Opioid Risk Tool - 04/12/24 1011       Family History of Substance Abuse   Alcohol Positive Female    Illegal Drugs Negative    Rx Drugs Negative      Personal History of Substance Abuse   Alcohol Negative    Illegal Drugs Negative    Rx Drugs Negative      History of Preadolescent Sexual Abuse   History of Preadolescent Sexual Abuse Negative or Female      Psychological Disease   Psychological Disease Negative    Depression Positive      Total Score   Opioid Risk Tool Scoring 2    Opioid Risk Interpretation Low Risk         ORT Scoring interpretation table:  Score <3 = Low Risk for SUD  Score between 4-7 = Moderate Risk for SUD  Score >8 = High Risk for Opioid Abuse   PHQ-2 Depression Scale:  Total score: 0  PHQ-2 Scoring interpretation table: (Score and  probability of major depressive disorder)  Score 0 = No depression  Score 1 = 15.4% Probability  Score 2 = 21.1% Probability  Score 3 = 38.4% Probability  Score 4 = 45.5% Probability  Score 5 = 56.4% Probability  Score 6 = 78.6% Probability   PHQ-9 Depression Scale:  Total score: 0  PHQ-9 Scoring interpretation table:  Score 0-4 = No depression  Score 5-9 = Mild depression  Score 10-14 = Moderate depression  Score 15-19 = Moderately severe depression  Score 20-27 = Severe depression (2.4 times higher risk of SUD and 2.89 times higher risk of overuse)   Pharmacologic Plan: As per protocol, I have not taken over any controlled substance management, pending the results of ordered tests and/or consults.            Initial impression: Pending review of available data and ordered tests.  Meds   Current Outpatient Medications:    albuterol  (PROVENTIL ) (2.5 MG/3ML) 0.083% nebulizer solution, Take 2.5 mg by nebulization every 6 (six) hours as needed for wheezing or shortness of breath., Disp: , Rfl:    budesonide -formoterol  (SYMBICORT ) 160-4.5 MCG/ACT inhaler, Inhale 2 puffs into the lungs 2 (two) times daily., Disp: 1 each, Rfl: 3   DAPTOmycin  (CUBICIN ) 500 MG injection, Inject into the vein., Disp: , Rfl:    gabapentin  (NEURONTIN ) 300 MG capsule, Take 1 capsule (300 mg total) by mouth 3 (three) times daily., Disp: 90 capsule, Rfl: 2   metFORMIN  (GLUCOPHAGE ) 500 MG tablet, Take 1 tablet (500 mg total) by mouth 2 (two) times daily with a meal., Disp: 180 tablet, Rfl: 1   metoprolol  tartrate (LOPRESSOR ) 100 MG tablet, Take 1 tablet (  100 mg total) by mouth 2 (two) times daily., Disp: 180 tablet, Rfl: 1   oxyCODONE -acetaminophen  (PERCOCET) 5-325 MG tablet, Take 1 tablet by mouth every 4 (four) hours as needed for severe pain (pain score 7-10)., Disp: 30 tablet, Rfl: 0   sulfamethoxazole -trimethoprim  (BACTRIM  DS) 800-160 MG tablet, Take 1 tablet by mouth 2 (two) times daily., Disp: 60 tablet, Rfl:  3   VENTOLIN  HFA 108 (90 Base) MCG/ACT inhaler, INHALE 1 PUFF INTO THE LUNGS EVERY 6 HOURS AS NEEDED FOR WHEEZING OR SHORTNESS OF BREATH., Disp: 18 each, Rfl: 3   methocarbamol  (ROBAXIN ) 500 MG tablet, TAKE 1 TABLET BY MOUTH EVERY 6 HOURS AS NEEDED FOR MUSCLE SPASMS. (Patient not taking: Reported on 04/12/2024), Disp: 120 tablet, Rfl: 0  Imaging Review  Cervical Imaging: Cervical MR w/wo contrast: Results for orders placed during the hospital encounter of 10/18/23 MR CERVICAL SPINE W WO CONTRAST  Narrative CLINICAL DATA:  Initial evaluation for severe back pain, MRSA bacteremia.  EXAM: MRI CERVICAL SPINE WITHOUT AND WITH CONTRAST  TECHNIQUE: Multiplanar and multiecho pulse sequences of the cervical spine, to include the craniocervical junction and cervicothoracic junction, were obtained without and with intravenous contrast.  CONTRAST:  5mL GADAVIST  GADOBUTROL  1 MMOL/ML IV SOLN  COMPARISON:  Prior study from 06/02/2019.  FINDINGS: Alignment: Straightening of the normal cervical lordosis. No listhesis.  Vertebrae: Vertebral body height maintained without acute or chronic fracture. Bone marrow signal intensity within normal limits. No worrisome osseous lesions. Reactive endplate change with marrow edema and enhancement present about the C3-4 through C6-7 interspaces. These findings are felt to be most consistent with reactive degenerative endplate changes. No other evidence for osteomyelitis discitis or septic arthritis.  Cord: Normal signal and morphology. No epidural collections or abnormal enhancement.  Partially empty sella noted. Visualized brain and posterior fossa otherwise unremarkable. Craniocervical junction normal. Paraspinous soft tissues within normal limits. Normal flow voids seen within the vertebral arteries bilaterally.  Disc levels:  C2-C3: Mild disc bulge with uncovertebral spurring. Mild left-sided facet hypertrophy. No canal or foraminal  stenosis.  C3-C4: Degenerative intervertebral disc space narrowing with diffuse disc osteophyte complex. Flattening of the ventral thecal sac with resultant mild spinal stenosis. Moderate to severe right with moderate left C4 foraminal stenosis.  C4-C5: Disc bulge with bilateral uncovertebral spurring. Flattening of the ventral thecal sac with no more than mild spinal stenosis. Moderate bilateral C5 foraminal narrowing.  C5-C6: Degenerative vertebral disc space narrowing with diffuse disc osteophyte complex. Flattening and partial effacement of the ventral thecal sac with no more than mild spinal stenosis. Severe left with mild right C6 foraminal stenosis.  C6-C7: Degenerative vertebral disc space narrowing with diffuse disc osteophyte complex. Flattening and partial effacement of the ventral thecal sac with no more than mild spinal stenosis. Superimposed mild right-sided facet hypertrophy. Moderate right with mild left C7 foraminal stenosis.  C7-T1: Tiny central disc protrusion minimally indents the ventral thecal sac. Mild facet degeneration. No spinal stenosis. Foramina remain patent.  IMPRESSION: 1. No MRI evidence for acute infection within the cervical spine. 2. Multilevel cervical spondylosis with resultant mild diffuse spinal stenosis at C3-4 through C6-7. 3. Multifactorial degenerative changes with resultant multilevel foraminal narrowing as above. Notable findings include moderate to severe right with moderate left C4 foraminal stenosis, moderate bilateral C5 foraminal narrowing, severe left with mild right C6 foraminal stenosis, with moderate right C7 foraminal narrowing.   Electronically Signed By: Morene Hoard M.D. On: 10/24/2023 04:44  Thoracic Imaging: Thoracic MR w/wo contrast: Results for  orders placed during the hospital encounter of 03/02/24 MR THORACIC SPINE W WO CONTRAST  Narrative CLINICAL DATA:  Osteomyelitis, evaluation for infection.  History of spinal surgery in February. Lower back pain.  EXAM: MRI THORACIC AND LUMBAR SPINE WITHOUT AND WITH CONTRAST  TECHNIQUE: Multiplanar and multiecho pulse sequences of the thoracic and lumbar spine were obtained without and with intravenous contrast.  CONTRAST:  5mL GADAVIST  GADOBUTROL  1 MMOL/ML IV SOLN  COMPARISON:  MRI thoracic spine 11/23/2023, MRI lumbar spine 01/15/2024.  FINDINGS: MRI THORACIC SPINE FINDINGS  Alignment: Thoracic kyphosis is maintained. No significant listhesis.  Vertebrae: Posterior instrumented fusion spanning T4-T9 with bilateral pedicle screws at each level with the exception of T7. At the T7 level there is a left-sided pedicle screw. There is similar anterior wedging and height loss of the T6 and T7 vertebral bodies. Endplate irregularity at T6-7 is slightly decreased from prior. Decreased edema within the T6 and T7 vertebra compared to prior. There is residual edema and enhancement along the endplates at T6-7. There is mild signal abnormality within the T6-7 disc. The previously noted epidural enhancement is significantly decreased from prior. There is a possible small focus of residual epidural enhancement along the posterior aspect of the inferior T6 vertebra and T7 vertebra noted on sagittal postcontrast image 8. There is signal abnormality along the T7-8 and T8-9 endplates with possible enhancement posteriorly at T7-8. There is no definite enhancement of the endplates at T8-9.  Cord:  Normal signal and morphology.  Paraspinal and other soft tissues: Postsurgical changes in the paraspinal soft tissues. The previously noted paraspinal enhancement at T5-T9 is significantly decreased on the current study.  Disc levels:  Intervertebral disc space narrowing at multiple levels. There are shallow disc bulges at multiple levels. Disc bulges at T9-10 through T11-12 indent the ventral thecal sac without contacting the spinal cord. No  high-grade spinal canal stenosis. There is improved patency of the spinal canal in the midthoracic spine.  MRI LUMBAR SPINE FINDINGS  Segmentation:  Standard.  Alignment: Lumbar lordosis is maintained. Grade 1 anterolisthesis of L3 on L4 and of L4 on L5 is similar to prior.  Vertebrae: Similar appearance of bone marrow edema anteriorly at L1-2. There is slightly decreased enhancement at the L1-2 level. There is slightly decreased signal abnormality within the disc at L1-2. Additional edema involving the spinous processes at L2-3 with mild associated enhancement which is slightly decreased from prior. There is significantly decreased enhancement of the adjacent soft tissues.  Bone marrow edema involving the L4-5 endplates asymmetric to the right again noted.  Conus medullaris: Extends to the L1 level and appears normal.  Paraspinal and other soft tissues: Decreased edema and enhancement of soft tissues adjacent to the L2 and L3 spinous processes. Mild atrophy of the paraspinal musculature.  Disc levels:  Small disc bulge at L1-2 without significant spinal canal stenosis. Partial uncovering of the disc at L3-4. Diffuse disc bulge resulting in lateral recess narrowing at L3-4 with mild spinal canal stenosis. Additional anterolisthesis at L4-5 with partial uncovering of the disc. Diffuse disc bulge and facet arthrosis at L4-5 resulting in mild-to-moderate spinal canal stenosis. There is moderate foraminal narrowing on the left at L3-4 and on the right at L4-5.  IMPRESSION: Interval posterior fusion of the thoracic spine as above.  Decreased diffuse edema in the T6 and T7 vertebral bodies. Residual edema and enhancement along the endplates at T6-7 and T7-8 concerning for residual discitis/osteomyelitis.  Similar discitis/osteomyelitis at L1-2. Residual edema and enhancement of  the L2-3 spinous processes which is decreased from prior concerning for residual  discitis/osteomyelitis.  Significant decreased epidural phlegmon/abscess in the thoracic spine compared to study on 11/23/2023. On the current study there is mild residual enhancement along the posterior aspect of T6 and T7 which may reflect small residual epidural phlegmon. Improved spinal canal patency at these levels.  Redemonstrated compression deformities of the T6 and T7 vertebra without significant interval height loss.  Degenerative changes are similar to prior.   Electronically Signed By: Donnice Mania M.D. On: 03/17/2024 19:53  Thoracic DG 2-3 views: Results for orders placed during the hospital encounter of 01/29/24 DG Thoracic Spine 2 View  Narrative CLINICAL DATA:  Thoracic fusion  EXAM: THORACIC SPINE 2 VIEWS  COMPARISON:  November 21, 2023  FINDINGS: Status post posterior fixation of T4-T9. Orthopedic hardware is intact and without periprosthetic fracture or lucency. Mottled sclerosis of the intervening vertebral bodies is not well appreciated radiographically and is better visualized on prior CT. Intervertebral disc space height loss spanning T4-9. Multilevel endplate proliferative changes of the lower thoracic spine.  IMPRESSION: Status post posterior fixation of T4-T9 without evidence of hardware complication.   Electronically Signed By: Corean Salter M.D. On: 02/12/2024 16:56  Lumbosacral Imaging: Lumbar MR w/wo contrast: Results for orders placed during the hospital encounter of 03/02/24 MR Lumbar Spine W Wo Contrast  Narrative CLINICAL DATA:  Osteomyelitis, evaluation for infection. History of spinal surgery in February. Lower back pain.  EXAM: MRI THORACIC AND LUMBAR SPINE WITHOUT AND WITH CONTRAST  TECHNIQUE: Multiplanar and multiecho pulse sequences of the thoracic and lumbar spine were obtained without and with intravenous contrast.  CONTRAST:  5mL GADAVIST  GADOBUTROL  1 MMOL/ML IV SOLN  COMPARISON:  MRI thoracic spine  11/23/2023, MRI lumbar spine 01/15/2024.  FINDINGS: MRI THORACIC SPINE FINDINGS  Alignment: Thoracic kyphosis is maintained. No significant listhesis.  Vertebrae: Posterior instrumented fusion spanning T4-T9 with bilateral pedicle screws at each level with the exception of T7. At the T7 level there is a left-sided pedicle screw. There is similar anterior wedging and height loss of the T6 and T7 vertebral bodies. Endplate irregularity at T6-7 is slightly decreased from prior. Decreased edema within the T6 and T7 vertebra compared to prior. There is residual edema and enhancement along the endplates at T6-7. There is mild signal abnormality within the T6-7 disc. The previously noted epidural enhancement is significantly decreased from prior. There is a possible small focus of residual epidural enhancement along the posterior aspect of the inferior T6 vertebra and T7 vertebra noted on sagittal postcontrast image 8. There is signal abnormality along the T7-8 and T8-9 endplates with possible enhancement posteriorly at T7-8. There is no definite enhancement of the endplates at T8-9.  Cord:  Normal signal and morphology.  Paraspinal and other soft tissues: Postsurgical changes in the paraspinal soft tissues. The previously noted paraspinal enhancement at T5-T9 is significantly decreased on the current study.  Disc levels:  Intervertebral disc space narrowing at multiple levels. There are shallow disc bulges at multiple levels. Disc bulges at T9-10 through T11-12 indent the ventral thecal sac without contacting the spinal cord. No high-grade spinal canal stenosis. There is improved patency of the spinal canal in the midthoracic spine.  MRI LUMBAR SPINE FINDINGS  Segmentation:  Standard.  Alignment: Lumbar lordosis is maintained. Grade 1 anterolisthesis of L3 on L4 and of L4 on L5 is similar to prior.  Vertebrae: Similar appearance of bone marrow edema anteriorly at L1-2. There  is slightly decreased  enhancement at the L1-2 level. There is slightly decreased signal abnormality within the disc at L1-2. Additional edema involving the spinous processes at L2-3 with mild associated enhancement which is slightly decreased from prior. There is significantly decreased enhancement of the adjacent soft tissues.  Bone marrow edema involving the L4-5 endplates asymmetric to the right again noted.  Conus medullaris: Extends to the L1 level and appears normal.  Paraspinal and other soft tissues: Decreased edema and enhancement of soft tissues adjacent to the L2 and L3 spinous processes. Mild atrophy of the paraspinal musculature.  Disc levels:  Small disc bulge at L1-2 without significant spinal canal stenosis. Partial uncovering of the disc at L3-4. Diffuse disc bulge resulting in lateral recess narrowing at L3-4 with mild spinal canal stenosis. Additional anterolisthesis at L4-5 with partial uncovering of the disc. Diffuse disc bulge and facet arthrosis at L4-5 resulting in mild-to-moderate spinal canal stenosis. There is moderate foraminal narrowing on the left at L3-4 and on the right at L4-5.  IMPRESSION: Interval posterior fusion of the thoracic spine as above.  Decreased diffuse edema in the T6 and T7 vertebral bodies. Residual edema and enhancement along the endplates at T6-7 and T7-8 concerning for residual discitis/osteomyelitis.  Similar discitis/osteomyelitis at L1-2. Residual edema and enhancement of the L2-3 spinous processes which is decreased from prior concerning for residual discitis/osteomyelitis.  Significant decreased epidural phlegmon/abscess in the thoracic spine compared to study on 11/23/2023. On the current study there is mild residual enhancement along the posterior aspect of T6 and T7 which may reflect small residual epidural phlegmon. Improved spinal canal patency at these levels.  Redemonstrated compression deformities of the T6  and T7 vertebra without significant interval height loss.  Degenerative changes are similar to prior.   Electronically Signed By: Donnice Mania M.D. On: 03/17/2024 19:53  Lumbar DG 2-3 views: Results for orders placed during the hospital encounter of 12/25/17 DG Lumbar Spine 2-3 Views  Narrative CLINICAL DATA:  Question low back bruise today.  EXAM: LUMBAR SPINE - 2-3 VIEW  COMPARISON:  CT abdomen pelvis Mar 01, 2014  FINDINGS: There is no evidence of lumbar spine fracture. There is grade 1 anterolisthesis of L4 on L5. Facet joint degenerative joint changes are identified in the lower lumbar spine. Minimal anterior osteophytosis is identified at L3, L4 and L5.  IMPRESSION: Degenerative joint changes of the lower lumbar spine. There is grade 1 anterolisthesis of L4 on L5, likely degenerative.   Electronically Signed By: Craig Farr M.D. On: 12/25/2017 13:48  Lumbar DG (Complete) 4+V: Results for orders placed in visit on 11/21/23 DG Lumbar Spine Complete  Narrative CLINICAL DATA:  Back pain.  EXAM: LUMBAR SPINE - COMPLETE 4+ VIEW  COMPARISON:  Lumbar spine MRI 10/23/2023  FINDINGS: Stable degenerative anterolisthesis of L3 and L4. No acute bony findings or destructive bony changes.  IMPRESSION: 1. Stable degenerative anterolisthesis of L3 and L4. 2. No acute bony findings.   Electronically Signed By: MYRTIS Stammer M.D. On: 11/23/2023 21:31  Foot Imaging: Foot-L DG Complete: Results for orders placed in visit on 02/19/24 DG Foot Complete Left  Narrative Please see detailed radiograph report in office note.  Complexity Note: Imaging results reviewed.                         ROS  Cardiovascular: High blood pressure Pulmonary or Respiratory: Wheezing and difficulty taking a deep full breath (Asthma) and Shortness of breath Neurological: Curved spine Psychological-Psychiatric: Anxiousness,  Depressed, Prone to panicking, and Difficulty sleeping and  or falling asleep Gastrointestinal: Reflux or heatburn, Alternating episodes iof diarrhea and constipation (IBS-Irritable bowe syndrome), Inflamed pancreas (Pancreatitis), and Irregular, infrequent bowel movements (Constipation) Genitourinary: No reported renal or genitourinary signs or symptoms such as difficulty voiding or producing urine, peeing blood, non-functioning kidney, kidney stones, difficulty emptying the bladder, difficulty controlling the flow of urine, or chronic kidney disease Hematological: Brusing easily and Bleeding easily Endocrine: High blood sugar controlled without the use of insulin  (NIDDM) Rheumatologic: Localized muscle inflammation and weakness (Myositis) Musculoskeletal: Negative for myasthenia gravis, muscular dystrophy, multiple sclerosis or malignant hyperthermia Work History: Out of work due to pain  Allergies  Danielle Harrison is allergic to diclofenac.  Laboratory Chemistry Profile   Renal Lab Results  Component Value Date   BUN 21 03/23/2024   CREATININE 0.56 (L) 03/23/2024   BCR 38 (H) 03/23/2024   GFRAA >60 01/11/2020   GFRNONAA >60 03/22/2024   PROTEINUR 30 (A) 10/23/2023     Electrolytes Lab Results  Component Value Date   NA 136 03/23/2024   K 4.9 03/23/2024   CL 102 03/23/2024   CALCIUM 10.2 03/23/2024   MG 2.1 11/04/2023   PHOS 5.6 (H) 11/04/2023     Hepatic Lab Results  Component Value Date   AST 13 03/23/2024   ALT 22 03/23/2024   ALBUMIN 4.4 03/23/2024   ALKPHOS 138 (H) 03/23/2024   LIPASE 21 11/23/2023     ID Lab Results  Component Value Date   HIV Non Reactive 10/18/2023   SARSCOV2NAA NEGATIVE 06/02/2022   STAPHAUREUS NEGATIVE 01/16/2024   MRSAPCR NEGATIVE 01/16/2024   PREGTESTUR NEGATIVE 10/27/2023     Bone No results found for: VD25OH, [id number], CI6874NY7, CI7874NY7, 25OHVITD1, 25OHVITD2, 25OHVITD3, TESTOFREE, TESTOSTERONE   Endocrine Lab Results  Component Value Date   GLUCOSE 126 (H) 03/23/2024    GLUCOSEU >=500 (A) 10/23/2023   HGBA1C 7.3 (H) 03/23/2024   TSH 1.840 03/23/2024   FREET4 0.68 01/29/2022     Neuropathy Lab Results  Component Value Date   HGBA1C 7.3 (H) 03/23/2024   HIV Non Reactive 10/18/2023     CNS No results found for: COLORCSF, APPEARCSF, RBCCOUNTCSF, WBCCSF, POLYSCSF, LYMPHSCSF, EOSCSF, PROTEINCSF, GLUCCSF, JCVIRUS, CSFOLI, IGGCSF, LABACHR, ACETBL   Inflammation (CRP: Acute  ESR: Chronic) Lab Results  Component Value Date   CRP 0.8 02/12/2024   ESRSEDRATE 9 02/12/2024   LATICACIDVEN 2.1 (HH) 11/23/2023     Rheumatology Lab Results  Component Value Date   LABURIC 2.9 10/25/2023     Coagulation Lab Results  Component Value Date   INR 1.1 11/23/2023   LABPROT 14.2 11/23/2023   APTT 31 11/23/2023   PLT 375 03/22/2024     Cardiovascular Lab Results  Component Value Date   CKTOTAL 14 (L) 11/03/2023   TROPONINI < 0.02 03/01/2014   HGB 11.3 (L) 03/22/2024   HCT 35.1 (L) 03/22/2024     Screening Lab Results  Component Value Date   SARSCOV2NAA NEGATIVE 06/02/2022   STAPHAUREUS NEGATIVE 01/16/2024   MRSAPCR NEGATIVE 01/16/2024   HIV Non Reactive 10/18/2023   PREGTESTUR NEGATIVE 10/27/2023     Cancer No results found for: CEA, CA125, LABCA2   Allergens No results found for: ALMOND, APPLE, ASPARAGUS, AVOCADO, BANANA, BARLEY, BASIL, BAYLEAF, GREENBEAN, LIMABEAN, WHITEBEAN, BEEFIGE, REDBEET, BLUEBERRY, BROCCOLI, CABBAGE, MELON, CARROT, CASEIN, CASHEWNUT, CAULIFLOWER, CELERY     Note: Lab results reviewed.  PFSH  Drug: Danielle Harrison  reports no history of drug use. Alcohol:  reports current alcohol use. Tobacco:  reports that she has quit smoking. Her smoking use included cigarettes. She has a 3 pack-year smoking history. She has never used smokeless tobacco. Medical:  has a past medical history of Acute bacterial endocarditis, Anxiety, Arthritis, Asthma, DM  (diabetes mellitus), type 2 (HCC), HTN (hypertension), MRSA bacteremia, Neuromuscular disorder (HCC), Osteomyelitis of lumbar spine (HCC), Osteomyelitis of toe of left foot (HCC), Restless leg syndrome, and SBO (small bowel obstruction) (HCC). Family: family history includes Arthritis in her mother; Heart disease in her father.  Past Surgical History:  Procedure Laterality Date   APPLICATION OF INTRAOPERATIVE CT SCAN N/A 11/25/2023   Procedure: APPLICATION OF INTRAOPERATIVE CT SCAN;  Surgeon: Claudene Penne ORN, MD;  Location: [facility];  Service: Neurosurgery;  Laterality: N/A;   BIOPSY  06/28/2019   Procedure: BIOPSY;  Surgeon: Janalyn Keene NOVAK, MD;  Location: [facility];  Service: Endoscopy;;   CESAREAN SECTION     x2   COLONOSCOPY WITH PROPOFOL  N/A 06/28/2019   Procedure: COLONOSCOPY WITH PROPOFOL ;  Surgeon: Janalyn Keene NOVAK, MD;  Location: [facility];  Service: Endoscopy;  Laterality: N/A;   ESOPHAGOGASTRODUODENOSCOPY (EGD) WITH PROPOFOL  N/A 06/28/2019   Procedure: ESOPHAGOGASTRODUODENOSCOPY (EGD) WITH PROPOFOL ;  Surgeon: Janalyn Keene NOVAK, MD;  Location: [facility];  Service: Endoscopy;  Laterality: N/A;   INCISION AND DRAINAGE Left 10/27/2023   Procedure: INCISION AND DRAINAGE LEFT FOOT;  Surgeon: Malvin Marsa FALCON, DPM;  Location: [facility];  Service: Orthopedics/Podiatry;  Laterality: Left;   INCISION AND DRAINAGE Left 10/30/2023   Procedure: LEFT FOOT FIRST METATARSAL PHALANGEAL JOINT RESECTION, ANTIBIOTIC SPACER, WASHOUT, WOUND CLOSURE;  Surgeon: Malvin Marsa FALCON, DPM;  Location: [facility];  Service: Orthopedics/Podiatry;  Laterality: Left;   IR LUMBAR DISC ASPIRATION W/IMG GUIDE  01/19/2024   METATARSAL HEAD EXCISION Left 03/03/2024   Procedure: EXCISION, METATARSAL BONE, HEAD;  Surgeon: Malvin Marsa FALCON, DPM;  Location: [facility];  Service: Orthopedics/Podiatry;  Laterality: Left;  LEFT FOOT REMOVAL AND REINSTERTION OF CEMENT SPACER    TEE WITHOUT CARDIOVERSION N/A 10/29/2023   Procedure: TRANSESOPHAGEAL ECHOCARDIOGRAM (TEE);  Surgeon: Perla Evalene PARAS, MD;  Location: [facility];  Service: Cardiovascular;  Laterality: N/A;   THORACIC LAMINECTOMY FOR EPIDURAL ABSCESS N/A 11/25/2023   Procedure: T6-T7 thoracic laminectomy, right sided transpedicular decompression and disc debridement;  Surgeon: Claudene Penne ORN, MD;  Location: [facility];  Service: Neurosurgery;  Laterality: N/A;   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   Procedure: LEFT FOOT 1ST MPJ RESECTION;  Surgeon: Malvin Marsa FALCON, DPM;  Location: [facility];  Service: Orthopedics/Podiatry;  Laterality: Left;   TRANSMETATARSAL AMPUTATION Left 10/30/2023   Procedure: LEFT FOOT 1ST MPJ RESECTION;  Surgeon: Malvin Marsa FALCON, DPM;  Location: [facility];  Service: Orthopedics/Podiatry;  Laterality: Left;   Active Ambulatory Problems    Diagnosis Date Noted   Stomach ache    Diarrhea    Stomach irritation    Acute pancreatitis 02/27/2021   Asthma 02/27/2021   Abdominal pain 01/29/2022   Small bowel intussusception (HCC) 10/19/2023   Pancreatitis 10/19/2023   Septic arthritis of left foot (HCC) 10/26/2023   Acute hematogenous osteomyelitis of left foot (HCC) 10/27/2023   Abscess of left foot 10/27/2023   MRSA bacteremia 10/27/2023   Osteomyelitis of thoracic spine (HCC) 10/28/2023   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   Acute osteomyelitis of left foot (HCC) 10/30/2023   Acute bilateral back pain 11/21/2023   HTN (hypertension)    Hyperkalemia 11/23/2023   Epidural abscess 11/23/2023  Acute osteomyelitis of thoracic spine (HCC) 11/24/2023   Pathologic fracture of thoracic vertebrae, initial encounter 11/25/2023   MRSA infection 11/27/2023   Type 2 diabetes mellitus without complication, without long-term current use of insulin  (HCC) 12/17/2023   Acute osteomyelitis of lumbar spine (HCC) 01/15/2024   Osteomyelitis (HCC) 01/15/2024   Discitis of lumbar  region 01/16/2024   Osteomyelitis of lumbar spine (HCC) 01/16/2024   Abnormal MRI, thoracic spine (11/23/2023) 01/18/2024   Abnormal MRI, lumbar spine (01/15/2024) 01/18/2024   Pharmacologic therapy 01/18/2024   Disorder of skeletal system 01/18/2024   Problems influencing health status 01/18/2024   Chronic pain syndrome 01/18/2024   Chronic thoracic back pain (1ry area of Pain) (Right) 04/12/2024   Back pain with history of spinal surgery 04/12/2024   Chronic low back pain (3ry area of Pain) (Bilateral) w/o sciatica 04/12/2024   Chronic foot pain (2ry area of Pain) (Left) 04/12/2024   History of idiopathic transverse myelitis (HCC) 04/12/2024   Resolved Ambulatory Problems    Diagnosis Date Noted   No Resolved Ambulatory Problems   Past Medical History:  Diagnosis Date   Anxiety    Arthritis    DM (diabetes mellitus), type 2 (HCC)    Neuromuscular disorder (HCC)    Osteomyelitis of toe of left foot (HCC)    Restless leg syndrome    SBO (small bowel obstruction) (HCC)    Constitutional Exam  General appearance: Well nourished, well developed, and well hydrated. In no apparent acute distress Vitals:   04/12/24 1005  BP: 123/86  Pulse: (!) 101  Resp: 18  SpO2: 99%  Weight: 115 lb (52.2 kg)  Height: 5' (1.524 m)   BMI Assessment: Estimated body mass index is 22.46 kg/m as calculated from the following:   Height as of this encounter: 5' (1.524 m).   Weight as of this encounter: 115 lb (52.2 kg).  BMI interpretation table: BMI level Category Range association with higher incidence of chronic pain  <18 kg/m2 Underweight   18.5-24.9 kg/m2 Ideal body weight   25-29.9 kg/m2 Overweight Increased incidence by 20%  30-34.9 kg/m2 Obese (Class I) Increased incidence by 68%  35-39.9 kg/m2 Severe obesity (Class II) Increased incidence by 136%  >40 kg/m2 Extreme obesity (Class III) Increased incidence by 254%   Patient's current BMI Ideal Body weight  Body mass index is 22.46  kg/m. Ideal body weight: 45.5 kg (100 lb 4.9 oz) Adjusted ideal body weight: 48.2 kg (106 lb 3 oz)   BMI Readings from Last 4 Encounters:  04/12/24 22.46 kg/m  03/16/24 21.48 kg/m  02/12/24 21.87 kg/m  02/11/24 22.07 kg/m   Wt Readings from Last 4 Encounters:  04/12/24 115 lb (52.2 kg)  03/16/24 110 lb (49.9 kg)  02/12/24 112 lb (50.8 kg)  02/11/24 113 lb (51.3 kg)    Psych/Mental status: Alert, oriented x 3 (person, place, & time)       Eyes: PERLA Respiratory: No evidence of acute respiratory distress  Assessment  Primary Diagnosis & Pertinent Problem List: The primary encounter diagnosis was Chronic pain syndrome. Diagnoses of Pharmacologic therapy, Disorder of skeletal system, Problems influencing health status, Chronic thoracic back pain (1ry area of Pain) (Right), Back pain with history of spinal surgery, Chronic low back pain (3ry area of Pain) (Bilateral) w/o sciatica, Chronic foot pain, left, History of idiopathic transverse myelitis (HCC), Discitis of lumbar region, Epidural abscess, Osteomyelitis of lumbar spine (HCC), Osteomyelitis of thoracic spine (HCC), Pathologic fracture of thoracic vertebrae, initial encounter, and MRSA infection  were also pertinent to this visit.  Visit Diagnosis (New problems to examiner): 1. Chronic pain syndrome   2. Pharmacologic therapy   3. Disorder of skeletal system   4. Problems influencing health status   5. Chronic thoracic back pain (1ry area of Pain) (Right)   6. Back pain with history of spinal surgery   7. Chronic low back pain (3ry area of Pain) (Bilateral) w/o sciatica   8. Chronic foot pain, left   9. History of idiopathic transverse myelitis (HCC)   10. Discitis of lumbar region   11. Epidural abscess   12. Osteomyelitis of lumbar spine (HCC)   13. Osteomyelitis of thoracic spine (HCC)   14. Pathologic fracture of thoracic vertebrae, initial encounter   15. MRSA infection    Plan of Care (Initial workup plan)  Note:  Danielle Harrison was reminded that as per protocol, today's visit has been an evaluation only. We have not taken over the patient's controlled substance management.  Problem-specific plan: Assessment and Plan    Chronic pain due to transverse myelitis   Chronic pain since 2010 is linked to transverse myelitis in the lower spinal region, presenting as burning and pulling sensations in the upper back, mainly on the right side. Order CBC with differential to assess white blood cell count, tests for kidney and liver function, and inflammatory factors. Order x-rays of the thoracic spine and lower back. Consider a referral for physical therapy and provide information on a TENS unit.  Post-surgical pain in thoracic spine   She experiences burning and pulling pain following thoracic spine surgery in March 2025, with no post-surgical complications reported.  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 The recent procedure involved bone removal and insertion of a smaller concrete piece. Pain is dull and burning, especially on the ball of the foot towards the big toe. Order x-rays of the left foot.  Type 2 diabetes mellitus   Recently diagnosed with type 2 diabetes mellitus, currently managed with oral medication.       Lab Orders         Compliance Drug Analysis, Ur         Comp. Metabolic Panel (12)         Magnesium          Vitamin B12         Sedimentation rate         25-Hydroxy vitamin D Lcms D2+D3         C-reactive protein     Imaging Orders         DG Thoracic Spine W/Swimmers         DG Lumbar Spine Complete W/Bend         DG Foot Complete Left     Referral Orders  No referral(s) requested today   Procedure Orders    No procedure(s) ordered today   Pharmacotherapy (current): Medications ordered:  No orders of the defined types were placed in this encounter.  Medications administered during this  visit: Brookelynne L. Gronau had no medications administered during this visit.   Analgesic Pharmacotherapy:  Opioid Analgesics: For patients currently taking or requesting to take opioid analgesics, in accordance with [place]  Medical Board Guidelines, we will assess their risks and indications for the use of these substances. After completing our evaluation, we may offer recommendations, but we no longer take patients for medication management. The prescribing physician  will ultimately decide, based on his/her training and level of comfort whether to adopt any of the recommendations, including whether or not to prescribe such medicines.  Membrane stabilizer: To be determined at a later time  Muscle relaxant: To be determined at a later time  NSAID: To be determined at a later time  Other analgesic(s): To be determined at a later time   Interventional management options: Danielle Harrison was informed that there is no guarantee that she would be a candidate for interventional therapies. The decision will be based on the results of diagnostic studies, as well as Ms. Royals's risk profile.  Procedure(s) under consideration:  Pending results of ordered studies     Interventional Therapies  Risk Factors  Considerations  Medical Comorbidities:     Planned  Pending:      Under consideration:   Pending   Completed: (Analgesic benefit)1  None at this time   Therapeutic  Palliative (PRN) options:   None established   Completed by other providers:   None reported  1(Analgesic benefit): Expressed in percentage (%). (Local anesthetic[LA] +/- sedation  L.A.Local Anesthetic  Steroid benefit  Ongoing benefit)   Provider-requested follow-up: Return in about 2 weeks (around 04/26/2024) for ( ), Eval-day (M,W), (F2F), 2nd Visit, for review of ordered tests.  Future Appointments  Date Time Provider Department Center  04/23/2024  1:00 PM Scoggins, Jeoffrey, NP [facility] None  04/29/2024 10:00 AM  Standiford, Marsa FALCON, DPM [facility]  05/03/2024 10:00 AM Tanya Glisson, MD [facility] None  05/11/2024  3:15 PM Tobie Franky SQUIBB, DPM [facility]  05/18/2024  8:45 AM Fayette Bodily, MD [facility] None   I discussed the assessment and treatment plan with the patient. The patient was provided an opportunity to ask questions and all were answered. The patient agreed with the plan and demonstrated an understanding of the instructions.  Patient advised to call back or seek an in-person evaluation if the symptoms or condition worsens.  Duration of encounter: 45 minutes.  Total time on encounter, as per AMA guidelines included both the face-to-face and non-face-to-face time personally spent by the physician and/or other qualified health care professional(s) on the day of the encounter (includes time in activities that require the physician or other qualified health care professional and does not include time in activities normally performed by clinical staff). Physician's time may include the following activities when performed: Preparing to see the patient (e.g., pre-charting review of records, searching for previously ordered imaging, lab work, and nerve conduction tests) Review of prior analgesic pharmacotherapies. Reviewing PMP Interpreting ordered tests (e.g., lab work, imaging, nerve conduction tests) Performing post-procedure evaluations, including interpretation of diagnostic procedures Obtaining and/or reviewing separately obtained history Performing a medically appropriate examination and/or evaluation Counseling and educating the patient/family/caregiver Ordering medications, tests, or procedures Referring and communicating with other health care professionals (when not separately reported) Documenting clinical information in the electronic or other health record Independently interpreting results (not separately reported) and communicating results to the  patient/ family/caregiver Care coordination (not separately reported)  Note by: Glisson DELENA Tanya, MD (TTS and AI technology used. I apologize for any typographical errors that were not detected and corrected.) Date: 04/12/2024; Time: 12:43 PM

## 2024-04-12 NOTE — Progress Notes (Signed)
Rx for percocet sent

## 2024-04-12 NOTE — Patient Instructions (Addendum)
 ______________________________________________________________________    New Patients  Welcome to [facility].   Initial Visit The first or initial visit consists of an evaluation only.   Interventional pain management.  We offer therapies other than opioid controlled substances to manage chronic pain. These include, but are not limited to, diagnostic, therapeutic, and palliative specialized injection therapies (i.e.: Epidural Steroids, Facet Blocks, etc.). We specialize in a variety of nerve blocks as well as radiofrequency treatments. We offer pain implant evaluations and trials, as well as follow up management. In addition we also provide a variety joint injections, including Viscosupplementation (AKA: Gel Therapy).  Prescription Pain Medication. We specialize in alternatives to opioids. We can provide evaluations and recommendations for/of pharmacologic therapies based on CDC Guidelines.  We no longer take patients for long-term medication management. We will not be taking over your pain medications.  ______________________________________________________________________      ______________________________________________________________________    Patient Information update  To: All of our patients.  Re: Name change.  It has been made official that our current name, "[facility]"   will soon be changed to "[facility]".   The purpose of this change is to eliminate any confusion created by the concept of our practice being a "Medication Management Pain Clinic". In the past this has led to the misconception that we treat pain primarily by the use of prescription medications.  Nothing can be farther from the truth.   Understanding PAIN MANAGEMENT: To further understand what our practice does, you first have to  understand that "Pain Management" is a subspecialty that requires additional training once a physician has completed their specialty training, which can be in either Anesthesia, Neurology, Psychiatry, or Physical Medicine and Rehabilitation (PMR). Each one of these contributes to the final approach taken by each physician to the management of their patient's pain. To be a "Pain Management Specialist" you must have first completed one of the specialty trainings below.  Anesthesiologists - trained in clinical pharmacology and interventional techniques such as nerve blockade and regional as well as central neuroanatomy. They are trained to block pain before, during, and after surgical interventions.  Neurologists - trained in the diagnosis and pharmacological treatment of complex neurological conditions, such as Multiple Sclerosis, Parkinson's, spinal cord injuries, and other systemic conditions that may be associated with symptoms that may include but are not limited to pain. They tend to rely primarily on the treatment of chronic pain using prescription medications.  Psychiatrist - trained in conditions affecting the psychosocial wellbeing of patients including but not limited to depression, anxiety, schizophrenia, personality disorders, addiction, and other substance use disorders that may be associated with chronic pain. They tend to rely primarily on the treatment of chronic pain using prescription medications.   Physical Medicine and Rehabilitation (PMR) physicians, also known as physiatrists - trained to treat a wide variety of medical conditions affecting the brain, spinal cord, nerves, bones, joints, ligaments, muscles, and tendons. Their training is primarily aimed at treating patients that have suffered injuries that have caused severe physical impairment. Their training is primarily aimed at the physical therapy and rehabilitation of those patients. They may also work alongside orthopedic surgeons  or neurosurgeons using their expertise in assisting surgical patients to recover after their surgeries.  INTERVENTIONAL PAIN MANAGEMENT is sub-subspecialty of Pain Management.  Our physicians are Board-certified in Anesthesia, Pain Management, and Interventional Pain Management.  This meaning that  not only have they been trained and Board-certified in their specialty of Anesthesia, and subspecialty of Pain Management, but they have also received further training in the sub-subspecialty of Interventional Pain Management, in order to become Board-certified as INTERVENTIONAL PAIN MANAGEMENT SPECIALIST.    Mission: Our goal is to use our skills in  INTERVENTIONAL PAIN MANAGEMENT as alternatives to the chronic use of prescription opioid medications for the treatment of pain. To make this more clear, we have changed our name to reflect what we do and offer. We will continue to offer medication management assessment and recommendations, but we will not be taking over any patient's medication management.  ______________________________________________________________________     ______________________________________________________________________    TENS (Device can be purchased online, without prescription. Search: TENS 7000.) Transcutaneous electrical nerve stimulation (TENS) is a method of pain relief involving the use of a mild electrical current. A TENS machine is a small, battery-operated device that has leads connected to sticky pads called electrodes.   Rechargeable 9V batteries:     Electrode placement:   TENS UNIT SAFETY WARNING SHEET and INFORMATION INDICATIONS AND CONTRAINDICTIONS Read the operation manual before using the device. Federal law ([place] ) restricts this device to sale by or on the order of a physician. Observe your physician's precise instructions and let him show you where to apply the electrodes. For a successful therapy, the correct application of the electrodes is an  important factor. Carefully write down the settings your physician recommended. Indications for use This device is a prescription device and only for symptomatic relief of chronic intractable pain. Contraindications:   Any electrode placement that applies current to the carotid sinus (neck) region.   Patients with implanted electronic devices (for example, a pacemaker) or metallic implants should not undertake.   Any electrode placement that causes current to flow transcerebrally (through the head). The use of unit whenever pain symptoms are undiagnosed, unit etiology is determined.   The use of TENS whenever pain syndromes are undiagnosed, until etiology is established.  WARNINGS AND PRECAUTIONS  Warnings:   The device must be kept out of reach of children.   The safety of device for use during pregnancy or delivery has not been established.   Do not place electrodes on front of the throat. This may result in spasms of the laryngeal and pharyngeal muscles.   Do not place the electrodes over the carotid nerve (side of neck below ear).   The device is not effective for pain of central origin (headaches).   The device may interfere with electronic monitoring equipment (such as ECG monitors and ECG alarms).   Electrodes should not be placed over the eyes, in the mouth, or internally.   These devices have no curative value.   TENS devices should be used only under the continued supervision of a physician.   TENS is a symptomatic treatment and as such suppresses the sensation of pain which would otherwise serve as a protective mechanism. Precautions/Adverse Reactions   Isolated cases of skin irritation may occur at the site of electrode placement following long-term application.   Stimulation should be stopped and electrodes removed until the cause of the irritation can be determined.   Effectiveness is highly dependent upon patient selection by a person qualified in the management of pain patients.   If the device  treatment becomes ineffective or unpleasant, stimulation should be discontinued until reevaluation by a physician/clinician.   Always turn the device off before applying or removing electrodes.   Skin  irritation and electrode burns are potential adverse reactions.  PURPOSE: A Transcutaneous Electrical Nerve Stimulator, or TENS, unit is designed to relieve post-operative, acute and chronic pain. It is used for pain caused by peripheral nerves and not central. TENS units are prescription-only devices.  OPERATION: TENS units work in a couple of ways. The first way they are thought to work is by a method called the [employer]. The [employer] states that our brains can only handle one stimulus at a time. When you have chronic pain, this pain signal is constantly being sent to your brain and recognized as pain. When an electrical stimulus is added to the area of pain the body feels this electrical stimulus, and since the brain can only handle one thing at a time, the pain is not transmitted to the brain. The second method thought to be part of TENS unit's success is by way of stimulating our own bodies to release their own natural painkillers. TENS units do not work for everyone and results may vary. Always follow the instructions and warnings in your user's manual.  USE: One of the most important tasks that must be performed is battery maintenance. If you are using a Engineering geologist, always fully charge it and fully deplete it before charging it again. These batteries can develop memories and by not performing this charging task correctly, your battery's life can be greatly diminished. If your battery does develop a memory you can help expand the memory by charging for 12 - 13 hours and then completely depleting the battery. Always prepare the skin before applying electrodes. Your skin should be clean and free of any lotions or creams. If you are using electrodes that use conductive gel, apply a  small, even layer over the electrode. For carbon, self-adhesive electrodes, apply a drop of water  to the electrodes before applying to the skin. The electrodes attach to the lead wires and then the TENS unit. Always grasp the connector and not the cord when inserting or removing. When making adjustments, always make sure the unit's channels (1 and 2) are in the OFF position. The actual settings should be recommended and prescribed by your physician. Medical equipment suppliers don'tset or instruct users as to user settings. When you are using the BURST mode, the unit delivers a series of quick pulses followed by a rest. This cycle repeats itself frequently. Always have channels OFF before changing modes.  For MODULATION mode, the stimulation automatically varies the width of the pulse.  For CONVENTIONAL mode, the stimulation is constant. After the settings have been fine-tuned, set the timer to 30 or 60 minutes. Your physician should also prescribe the use time. When the lights become dim, it means your batteries should be replaced or recharged.  ACCESSORIES: The electrodes and lead wires can be obtained from your medical equipment supplier. Your medical equipment supplier can set up a recurring delivery to accommodate your needs. Electrodes should be replaced once a month and lead wires once every 6 months.  Video Tutorial https://youtu.be/V_quvXRrlQE?[email or web address]  ______________________________________________________________________

## 2024-04-12 NOTE — Progress Notes (Signed)
 Safety precautions to be maintained throughout the outpatient stay will include: orient to surroundings, keep bed in low position, maintain call bell within reach at all times, provide assistance with transfer out of bed and ambulation.

## 2024-04-12 NOTE — Telephone Encounter (Signed)
Patient called requesting a refill of oxycodone  

## 2024-04-14 ENCOUNTER — Telehealth: Payer: Self-pay

## 2024-04-14 DIAGNOSIS — E119 Type 2 diabetes mellitus without complications: Secondary | ICD-10-CM

## 2024-04-14 DIAGNOSIS — J4520 Mild intermittent asthma, uncomplicated: Secondary | ICD-10-CM

## 2024-04-14 NOTE — Progress Notes (Signed)
 Complex Care Management Note  Care Guide Note 04/14/2024 Name: MASYN FULLAM MRN: [id number] DOB: Nov 19, 1975  Tawni LITTIE Buboltz is a 48 y.o. year old female who sees [place], [facility] doctor, NP for primary care. I reached out to [employer] by phone today to offer complex care management services.  Ms. Diesing was given information about Complex Care Management services today including:   The Complex Care Management services include support from the care team which includes your Nurse Care Manager, Clinical Social Worker, or Pharmacist.  The Complex Care Management team is here to help remove barriers to the health concerns and goals most important to you. Complex Care Management services are voluntary, and the patient may decline or stop services at any time by request to their care team member.   Complex Care Management Consent Status: Patient did not agree to participate in complex care management services at this time.  Follow up plan:    Encounter Outcome:  Patient Refused  Jeoffrey Buffalo , RMA     [facility]  [facility], [facility] Guide  Direct Dial: [phone]  Website: [email or web address]

## 2024-04-15 LAB — COMPLIANCE DRUG ANALYSIS, UR

## 2024-04-17 LAB — COMP. METABOLIC PANEL (12)
AST: 26 IU/L (ref 0–40)
Albumin: 5.2 g/dL — ABNORMAL HIGH (ref 3.9–4.9)
Alkaline Phosphatase: 170 IU/L — ABNORMAL HIGH (ref 44–121)
BUN/Creatinine Ratio: 20 (ref 9–23)
BUN: 21 mg/dL (ref 6–24)
Bilirubin Total: 0.5 mg/dL (ref 0.0–1.2)
Calcium: 11 mg/dL — ABNORMAL HIGH (ref 8.7–10.2)
Chloride: 100 mmol/L (ref 96–106)
Creatinine, Ser: 1.05 mg/dL — ABNORMAL HIGH (ref 0.57–1.00)
Globulin, Total: 3 g/dL (ref 1.5–4.5)
Glucose: 147 mg/dL — ABNORMAL HIGH (ref 70–99)
Potassium: 4.7 mmol/L (ref 3.5–5.2)
Sodium: 139 mmol/L (ref 134–144)
Total Protein: 8.2 g/dL (ref 6.0–8.5)
eGFR: 66 mL/min/1.73 (ref >59)

## 2024-04-17 LAB — VITAMIN B12: Vitamin B-12: 473 pg/mL (ref 232–1245)

## 2024-04-17 LAB — SEDIMENTATION RATE: Sed Rate: 13 mm/h (ref 0–32)

## 2024-04-17 LAB — 25-HYDROXY VITAMIN D LCMS D2+D3
25-Hydroxy, Vitamin D-2: 4 ng/mL
25-Hydroxy, Vitamin D-3: 31 ng/mL
25-Hydroxy, Vitamin D: 35 ng/mL

## 2024-04-17 LAB — MAGNESIUM: Magnesium: 2.3 mg/dL (ref 1.6–2.3)

## 2024-04-17 LAB — C-REACTIVE PROTEIN: CRP: 2 mg/L (ref 0–10)

## 2024-04-19 ENCOUNTER — Other Ambulatory Visit: Payer: Self-pay | Admitting: Podiatry

## 2024-04-19 ENCOUNTER — Telehealth: Payer: Self-pay | Admitting: Podiatry

## 2024-04-19 ENCOUNTER — Other Ambulatory Visit: Payer: Self-pay | Admitting: Physician Assistant

## 2024-04-19 MED ORDER — OXYCODONE-ACETAMINOPHEN 5-325 MG PO TABS: 1.0000 | ORAL_TABLET | ORAL | 0 refills | Status: DC | PRN

## 2024-04-19 NOTE — Telephone Encounter (Signed)
 Patient requesting refill of Oxycodone  acetaminophen  5-325 sent to [facility] on [address] in [place]

## 2024-04-23 ENCOUNTER — Ambulatory Visit: Admitting: Cardiology

## 2024-04-26 ENCOUNTER — Other Ambulatory Visit: Payer: Self-pay | Admitting: Cardiology

## 2024-04-27 ENCOUNTER — Telehealth: Payer: Self-pay | Admitting: Podiatry

## 2024-04-27 NOTE — Telephone Encounter (Signed)
 Patient left message on voicemail requesting refill of Oxycodone  acetaminophen  5-325 sent to [facility] on [address] in [place]

## 2024-04-28 NOTE — Telephone Encounter (Signed)
Patient called to check on status of prescription.

## 2024-04-29 ENCOUNTER — Other Ambulatory Visit: Payer: Self-pay | Admitting: Cardiology

## 2024-04-29 ENCOUNTER — Other Ambulatory Visit: Payer: Self-pay | Admitting: Infectious Diseases

## 2024-04-29 ENCOUNTER — Ambulatory Visit (INDEPENDENT_AMBULATORY_CARE_PROVIDER_SITE_OTHER)

## 2024-04-29 ENCOUNTER — Ambulatory Visit (INDEPENDENT_AMBULATORY_CARE_PROVIDER_SITE_OTHER): Admitting: Podiatry

## 2024-04-29 DIAGNOSIS — M205X1 Other deformities of toe(s) (acquired), right foot: Secondary | ICD-10-CM

## 2024-04-29 DIAGNOSIS — Z9889 Other specified postprocedural states: Secondary | ICD-10-CM

## 2024-04-29 DIAGNOSIS — M2011 Hallux valgus (acquired), right foot: Secondary | ICD-10-CM

## 2024-04-29 DIAGNOSIS — M86072 Acute hematogenous osteomyelitis, left ankle and foot: Secondary | ICD-10-CM

## 2024-04-29 DIAGNOSIS — M19079 Primary osteoarthritis, unspecified ankle and foot: Secondary | ICD-10-CM

## 2024-04-29 MED ORDER — OXYCODONE-ACETAMINOPHEN 5-325 MG PO TABS: 1.0000 | ORAL_TABLET | ORAL | 0 refills | Status: AC | PRN

## 2024-04-29 MED ORDER — CICLOPIROX 8 % EX SOLN
Freq: Every day | CUTANEOUS | 0 refills | Status: DC
Start: 2024-04-29 — End: 2024-05-10

## 2024-04-29 NOTE — Progress Notes (Signed)
 Subjective:  Patient ID: Tawni LITTIE Free, female    DOB: April 19, 1976,  MRN: [id number]  Chief Complaint  Patient presents with   Foot Pain    F/U Partial resection of 1st metatarsal and 1st proximal phalanx, Left foot. 7 pain. NIDDM A1C 7.3.    DOS: 03/03/2024 Procedure: 1. Partial resection of 1st metatarsal and 1st proximal phalanx, Left foot 2. Removal and reinsertion of antibiotic cement spacer non biodegradable implant, left foot  48 y.o. female seen for post op check.  Patient seen follow-up on left foot first metatarsal and first proximal phalanx resection with antibiotic spacer exchange.  She reports she does have some continued pain in the left first MPJ though feels like the swelling is gone down.  She is seeing pain management next week.  Asking for refill of narcotic pain medication in the meantime.  She also reports pain in the right foot great toe joint and wants to see if anything can be done for that has to have x-rays taken there.  Review of Systems: Negative except as noted in the HPI. Denies N/V/F/Ch.   Objective:   Constitutional Well developed. Well nourished.  Vascular Foot warm and well perfused. Capillary refill normal to all digits.   No calf pain with palpation  Neurologic Normal speech. Oriented to person, place, and time. Epicritic sensation diminished to left foot  Dermatologic Decreased edema about the left foot first MPJ some tenderness to palpation at the plantar aspect of the spacer though slightly improved overall clinically.  There is no open wound present    Orthopedic: Status post repeat resection of left first metatarsal and first proximal phalanx with removal and replacement of antibiotic cement spacer with K wire fixation.  Decreasing edema of the first ray, mild tenderness palpation  On the right foot first MPJ there is noted the osseous spurring both dorsally and medially at the first metatarsal head consistent with osteoarthritis pain with  flexion and extension of the first MPJ.   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  Pathology: None taken 03/03/2024 IntraOp  Micro: 03/03/2024 wound swab culture from around the antibiotic cement spacer no growth   Assessment:   Painful and oversized antibiotics cement spacer status post removal and reinsertion of a smaller spacer with K wire fixation  Plan:  Patient was evaluated and treated and all questions answered.  8 weeks s/p above procedure with smaller cement spacer placed -Progressing as expected postop, improving with decreased swelling and pain  -Discussed that she is likely nearing maximal improvement on the left foot first first MPJ and if it still bothers her she would need to consider partial ray amputation if not improving.  She is back to wearing sandals and satisfied with the result however at this time as it is less swollen about the joint - No wound care needed -Continue on Bactrim  which she is taking for a year for her back which should cover her foot as well. 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  After sterile prep injected 1 cc half percent Marcaine  plain with 1 cc Kenalog 10 to the lateral aspect of the first MPJ on the right foot. -Discussed that unfortunately this will likely be temporary relief from her pain and may be variable in the amount of relief she gets due to the severe nature of her osteoarthritis there. -Could consider custom orthotics or surgical intervention of the first MPJ arthrodesis in the future if needed. - F/u Plan: Follow-up in 6 weeks        Marolyn JULIANNA Honour, DPM [facility] / [facility]

## 2024-05-02 NOTE — Progress Notes (Unsigned)
 PROVIDER NOTE: Interpretation of information contained herein should be left to medically-trained personnel. Specific patient instructions are provided elsewhere under Patient Instructions section of medical record. This document was created in part using AI and STT-dictation technology, any transcriptional errors that may result from this process are unintentional.  Patient: Danielle Harrison  Service: E/M   PCP: Carin Gauze, NP  DOB: 10/10/1976  DOS: 05/03/2024  Provider: Eric DELENA Como, MD  MRN: [id number]  Delivery: Face-to-face  Specialty: Interventional Pain Management  Type: Established Patient  Setting: Ambulatory outpatient facility  Specialty designation: 09  Referring Prov.: Scoggins, Amber, NP  Location: Outpatient office facility       Primary Reason(s) for Visit: Encounter for evaluation before starting new chronic pain management plan of care (Level of risk: moderate) CC: Shoulder Pain (right) and Toe Pain (Left, great)  HPI  Danielle Harrison is a 48 y.o. year old, female patient, who comes today for a follow-up evaluation to review the test results and decide on a treatment plan. She has Stomach ache; Diarrhea; Stomach irritation; Acute pancreatitis; Asthma; Abdominal pain; Small bowel intussusception (HCC); Pancreatitis; Septic arthritis of left foot (HCC); Acute hematogenous osteomyelitis of left foot (HCC); Abscess of left foot; MRSA bacteremia; Osteomyelitis of thoracic spine (HCC); Acute bacterial endocarditis; Acute osteomyelitis of left foot (HCC); Acute bilateral back pain; HTN (hypertension); Hyperkalemia; Epidural abscess; Acute osteomyelitis of thoracic spine (HCC); Pathologic fracture of thoracic vertebrae, initial encounter; MRSA infection; Type 2 diabetes mellitus without complication, without long-term current use of insulin  (HCC); Acute osteomyelitis of lumbar spine (HCC); Osteomyelitis (HCC); Discitis of lumbar region; Osteomyelitis of lumbar spine (HCC); Abnormal MRI,  thoracic spine (11/23/2023); Abnormal MRI, lumbar spine (01/15/2024); Pharmacologic therapy; Disorder of skeletal system; Problems influencing health status; Chronic pain syndrome; Chronic thoracic back pain (1ry area of Pain) (Right); Back pain with history of spinal surgery; Chronic low back pain (3ry area of Pain) (Bilateral) w/o sciatica; Chronic foot pain (2ry area of Pain) (Left); and History of idiopathic transverse myelitis (HCC) on their problem list. Her primarily concern today is the Shoulder Pain (right) and Toe Pain (Left, great)  Pain Assessment: Location: Left Toe (Comment which one) Radiating: left foot Onset: More than a month ago Duration: Chronic pain Quality: Burning, Stabbing Severity: 9 /10 (subjective, self-reported pain score)  Effect on ADL:   Timing: Constant Modifying factors: sitting BP: 109/69  HR: 77  Danielle Harrison comes in today for a follow-up visit after her initial evaluation on 04/12/2024. Today we went over the results of her tests. These were explained in Layman's terms. During today's appointment we went over my diagnostic impression, as well as the proposed treatment plan.  Review of initial evaluation (04/12/2024): Danielle Harrison is a 48 year old female with chronic back pain and transverse myelitis who presents with worsening back pain post-surgery. She was referred by Dr. Fleeta for evaluation of her chronic back pain.   She has experienced chronic back pain since 2010 following a diagnosis of transverse myelitis, which initially caused paralysis and numbness from the waist down, affecting her ability to walk. She has been treated with pain medications, including oxycodone  and morphine , and has received epidural steroid injections, which provided temporary relief.   Recently, she underwent back surgery due to a MRSA infection in her spine, discovered after severe back pain post-hospitalization for pancreatitis. The surgery involved the placement of screws  in her back. Post-surgery, she experiences a burning sensation and muscle pulling in the upper right thoracic region,  describing the pain as 'awful' and 'burning'.   She also has a history of MRSA infection in her left foot, leading to two surgeries to remove infected bone and insert a concrete spacer. The first surgery was followed by a second due to the initial spacer being too large. She experiences dull, burning pain in the left foot, particularly around the ball and big toe, making walking painful.   Her current pain management regimen includes oxycodone  10 mg, gabapentin  100 mg three times daily, and Percocet 5 mg as needed, up to four times daily. She also uses Advil and occasionally Tylenol  for additional pain relief. She has previously used a TENS unit for lower back pain.   She has type 2 diabetes, diagnosed approximately six months ago, managed with oral medication. No known liver or kidney issues and has not undergone nerve conduction tests for her legs. No current leg pain, numbness, or weakness, although she experiences daily numbness from the waist down. No side effects from her current medications.  Review of diagnostic test ordered on 04/12/2024:  Diagnostic lab work: Vitamin D  levels, C-reactive protein, magnesium , sed rate, and vitamin B12 levels were all within normal limits.  UDS was positive for carboxy THC.  Comprehensive metabolic panel showed elevated levels of blood glucose, serum creatinine, calcium, albumin, and alkaline phosphatase. Diagnostic imaging: Diagnostic x-rays of the left foot show amputation of the distal aspect of the metatarsal and proximal aspect of the proximal phalanx of the first ray.  Minimal interval decrease in moderate persistent regional soft tissue swelling.  Diagnostic x-rays of the lumbar spine with bending views demonstrated anterolisthesis of L4 over L5 that slightly increases with flexion.  Minimal anterolisthesis of L3 over L4 that does not  significantly change with flexion and extension.  Degenerative disc disease at L4-5.  Diagnostic x-rays of the thoracic spine show stable postsurgical changes in the thoracic spine.  No acute abnormality.  There are pedicle screws and rod fixation in the thoracic spine extending from T4-T9 with bilateral pedicle screws at each level except there is no right pedicle screw at T8 7.  No evidence of hardware complication.  Discussed the use of AI scribe software for clinical note transcription with the patient, who gave verbal consent to proceed.  History of Present Illness   Danielle Harrison is a 49 year old female with transverse myelitis and recent back surgery who presents for follow-up on pain management.  She has a history of transverse myelitis diagnosed in 2010, initially causing paralysis and numbness from the waist down, with some improvement over time.  Recently, she underwent back surgery in February or March due to a MRSA infection in her spine, following hospitalization for pancreatitis. Post-surgery, she experiences persistent pain described as a burning sensation and muscle pulling in the upper right thoracic region. She reports that since the surgery, the pain feels like more pressure on her back, and while swelling has gone down, she continues to experience significant pain.  She is currently taking oxycodone  approximately two to three times a day for back and foot pain, with the last prescription filled on April 29, 2024. Additionally, she takes gabapentin  300 mg three times a day, which helps alleviate some of the burning and electrical-like pain. She is on a long-term antibiotic regimen, possibly bosentan, to prevent recurrence of the MRSA infection due to the presence of screws in her back.  No fever or night sweats related to infection, attributing any night sweats to menopause. She lives  in [place] and her primary care physician is [employer].      Patient presented with  interventional treatment options. Danielle Harrison was informed that I will not be providing medication management. Pharmacotherapy evaluation including recommendations may be offered, if specifically requested.   Controlled Substance Pharmacotherapy Assessment REMS (Risk Evaluation and Mitigation Strategy)  Opioid Analgesic: Oxycodone /APAP 5/325, 1 tab p.o. every 4 hours (6/day) (# 30) (last filled on 04/29/2024)  MME/day: 45 mg/day   Pill Count: None expected due to no prior prescriptions written by our practice. No notes on file  Pharmacokinetics: Liberation and absorption (onset of action): WNL Distribution (time to peak effect): WNL Metabolism and excretion (duration of action): WNL         Pharmacodynamics: Desired effects: Analgesia: Danielle Harrison reports >50% benefit. Functional ability: Patient reports that medication allows her to accomplish basic ADLs Clinically meaningful improvement in function (CMIF): Sustained CMIF goals met Perceived effectiveness: Described as relatively effective, allowing for increase in activities of daily living (ADL) Undesirable effects: Side-effects or Adverse reactions: None reported Monitoring: [place] PMP: PDMP reviewed during this encounter. Online review of the past 29-month period previously conducted. Not applicable at this point since we have not taken over the patient's medication management yet. List of other Serum/Urine Drug Screening Test(s):  Lab Results  Component Value Date   COCAINSCRNUR NEGATIVE 03/01/2014   THCU POSITIVE 03/01/2014   List of all UDS test(s) done:  Lab Results  Component Value Date   SUMMARY FINAL 04/12/2024   Last UDS on record: Summary  Date Value Ref Range Status  04/12/2024 FINAL  Final    Comment:    ==================================================================== Compliance Drug Analysis, Ur ==================================================================== Test                             Result       Flag        Units  Drug Present and Declared for Prescription Verification   Noroxycodone                   109          EXPECTED   ng/mg creat    Noroxycodone is an expected metabolite of oxycodone . Sources of    oxycodone  include scheduled prescription medications.    Gabapentin                      PRESENT      EXPECTED   Methocarbamol                   PRESENT      EXPECTED   Guaifenesin                     PRESENT      EXPECTED    Guaifenesin  may be administered as an over-the-counter or    prescription drug; it may also be present as a breakdown product of    methocarbamol .    Acetaminophen                   PRESENT      EXPECTED  Drug Present not Declared for Prescription Verification   Carboxy-THC                    61           UNEXPECTED ng/mg creat    Carboxy-THC is a metabolite of tetrahydrocannabinol (THC). Source of  THC is most commonly herbal marijuana or marijuana-based products,    but THC is also present in a scheduled prescription medication.    Trace amounts of THC can be present in hemp and cannabidiol (CBD)    products. This test is not intended to distinguish between delta-9-    tetrahydrocannabinol, the predominant form of THC in most herbal or    marijuana-based products, and delta-8-tetrahydrocannabinol.    Ibuprofen                      PRESENT      UNEXPECTED  Drug Absent but Declared for Prescription Verification   Oxycodone                       Not Detected UNEXPECTED ng/mg creat    Oxycodone  is almost always present in patients taking this drug    consistently.  Absence of oxycodone  could be due to lapse of time    since the last dose or unusual pharmacokinetics (rapid metabolism).    Metoprolol                      Not Detected UNEXPECTED ==================================================================== Test                      Result    Flag   Units      Ref Range   Creatinine              228              mg/dL       >=79 ==================================================================== Declared Medications:  The flagging and interpretation on this report are based on the  following declared medications.  Unexpected results may arise from  inaccuracies in the declared medications.   **Note: The testing scope of this panel includes these medications:   Gabapentin   Methocarbamol  (Robaxin )  Metoprolol  (Lopressor )  Oxycodone  (Percocet)   **Note: The testing scope of this panel does not include small to  moderate amounts of these reported medications:   Acetaminophen  (Percocet)   **Note: The testing scope of this panel does not include the  following reported medications:   Albuterol   Budesonide  (Symbicort )  Daptomycin   Formoterol  (Symbicort )  Metformin  (Glucophage )  Sulfamethoxazole  (Bactrim )  Trimethoprim  (Bactrim ) ==================================================================== For clinical consultation, please call [phone]. ====================================================================    UDS interpretation: No unexpected findings.          Medication Assessment Form: Not applicable. No opioids. Treatment compliance: Not applicable Risk Assessment Profile: Aberrant behavior: See initial evaluations. None observed or detected today Comorbid factors increasing risk of overdose: See initial evaluation. No additional risks detected today Opioid risk tool (ORT):     04/12/2024   10:11 AM  Opioid Risk   Alcohol 1  Illegal Drugs 0  Rx Drugs 0  Alcohol 0  Illegal Drugs 0  Rx Drugs 0  History of Preadolescent Sexual Abuse 0  Psychological Disease 0  Depression 1  Opioid Risk Tool Scoring 2  Opioid Risk Interpretation Low Risk    ORT Scoring interpretation table:  Score <3 = Low Risk for SUD  Score between 4-7 = Moderate Risk for SUD  Score >8 = High Risk for Opioid Abuse   Risk of substance use disorder (SUD): Low  Risk Mitigation Strategies:  Patient  opioid safety counseling: No controlled substances prescribed. Patient-Prescriber Agreement (PPA): No agreement signed.  Controlled substance notification to other providers: None required. No  opioid therapy.  Pharmacologic Plan: Non-opioid analgesic therapy offered. Interventional alternatives discussed.             Laboratory Chemistry Profile   Renal Lab Results  Component Value Date   BUN 21 04/12/2024   CREATININE 1.05 (H) 04/12/2024   BCR 20 04/12/2024   GFRAA >60 01/11/2020   GFRNONAA >60 03/22/2024   PROTEINUR 30 (A) 10/23/2023     Electrolytes Lab Results  Component Value Date   NA 139 04/12/2024   K 4.7 04/12/2024   CL 100 04/12/2024   CALCIUM 11.0 (H) 04/12/2024   MG 2.3 04/12/2024   PHOS 5.6 (H) 11/04/2023     Hepatic Lab Results  Component Value Date   AST 26 04/12/2024   ALT 22 03/23/2024   ALBUMIN 5.2 (H) 04/12/2024   ALKPHOS 170 (H) 04/12/2024   LIPASE 21 11/23/2023     ID Lab Results  Component Value Date   HIV Non Reactive 10/18/2023   SARSCOV2NAA NEGATIVE 06/02/2022   STAPHAUREUS NEGATIVE 01/16/2024   MRSAPCR NEGATIVE 01/16/2024   PREGTESTUR NEGATIVE 10/27/2023     Bone Lab Results  Component Value Date   25OHVITD1 35 04/12/2024   25OHVITD2 4.0 04/12/2024   25OHVITD3 31 04/12/2024     Endocrine Lab Results  Component Value Date   GLUCOSE 147 (H) 04/12/2024   GLUCOSEU >=500 (A) 10/23/2023   HGBA1C 7.3 (H) 03/23/2024   TSH 1.840 03/23/2024   FREET4 0.68 01/29/2022     Neuropathy Lab Results  Component Value Date   VITAMINB12 473 04/12/2024   HGBA1C 7.3 (H) 03/23/2024   HIV Non Reactive 10/18/2023     CNS No results found for: COLORCSF, APPEARCSF, RBCCOUNTCSF, WBCCSF, POLYSCSF, LYMPHSCSF, EOSCSF, PROTEINCSF, GLUCCSF, JCVIRUS, CSFOLI, IGGCSF, LABACHR, ACETBL   Inflammation (CRP: Acute  ESR: Chronic) Lab Results  Component Value Date   CRP 2 04/12/2024   ESRSEDRATE 13 04/12/2024   LATICACIDVEN  2.1 (HH) 11/23/2023     Rheumatology Lab Results  Component Value Date   LABURIC 2.9 10/25/2023     Coagulation Lab Results  Component Value Date   INR 1.1 11/23/2023   LABPROT 14.2 11/23/2023   APTT 31 11/23/2023   PLT 375 03/22/2024     Cardiovascular Lab Results  Component Value Date   CKTOTAL 14 (L) 11/03/2023   TROPONINI < 0.02 03/01/2014   HGB 11.3 (L) 03/22/2024   HCT 35.1 (L) 03/22/2024     Screening Lab Results  Component Value Date   SARSCOV2NAA NEGATIVE 06/02/2022   STAPHAUREUS NEGATIVE 01/16/2024   MRSAPCR NEGATIVE 01/16/2024   HIV Non Reactive 10/18/2023   PREGTESTUR NEGATIVE 10/27/2023     Cancer No results found for: CEA, CA125, LABCA2   Allergens No results found for: ALMOND, APPLE, ASPARAGUS, AVOCADO, BANANA, BARLEY, BASIL, BAYLEAF, GREENBEAN, LIMABEAN, WHITEBEAN, BEEFIGE, REDBEET, BLUEBERRY, BROCCOLI, CABBAGE, MELON, CARROT, CASEIN, CASHEWNUT, CAULIFLOWER, CELERY     Note: Lab results reviewed.  Recent Diagnostic Imaging Review  Thoracic Imaging: Thoracic DG w/swimmers view: Results for orders placed during the hospital encounter of 04/12/24 [facility] Thoracic Spine W/Swimmers  Narrative CLINICAL DATA:  Upper thoracic back pain. Chronic right-sided thoracic back pain. Back pain with history of spinal surgery.  EXAM: THORACIC SPINE - 3 VIEWS  COMPARISON:  01/29/2024  FINDINGS: Again noted is pedicle screw and rod fixation in the thoracic spine extending from T4 through T9. Bilateral pedicle screws at each level except there is no right pedicle screw at T7. Alignment of the thoracic spine is stable.  Vertebral body heights are maintained. Normal alignment at the cervicothoracic junction and thoracolumbar junction. No evidence for a hardware complication.  IMPRESSION: Stable postsurgical changes in the thoracic spine. No acute abnormality.   Electronically Signed By: Juliene Balder M.D. On:  04/17/2024 11:59  Lumbosacral Imaging: Lumbar DG Bending views:  Results for orders placed during the hospital encounter of 04/12/24 DG Lumbar Spine Complete W/Bend  Narrative CLINICAL DATA:  Low back pain. Chronic bilateral low back pain without sciatica.  EXAM: LUMBAR SPINE - COMPLETE WITH BENDING VIEWS  COMPARISON:  11/21/2023 and MRI 03/02/2024  FINDINGS: Surgical fusion in the thoracic spine is only partially imaged. Anterolisthesis of L4 on L5 with disc space narrowing at L4-L5. The anterolisthesis measures approximately 9 mm on the neutral lateral view. The anterolisthesis slightly increases with flexion measuring 11 mm. Minimal anterolisthesis of L3 on L4 that does not significantly change with flexion or extension. Vertebral body heights are maintained. Focal irregularity along the anterior superior endplate of L2 was present on prior exams. Irregularity along the L5 left pars interarticularis but a spondylolysis is unlikely based on the recent MRI findings. Extensive facet arthropathy in the lower lumbar spine.  IMPRESSION: 1. Anterolisthesis of L4 on L5 that slightly increases with flexion. 2. Minimal anterolisthesis of L3 on L4 that does not significantly change with flexion or extension. 3. Degenerative disc disease at L4-L5.   Electronically Signed By: Juliene Balder M.D. On: 04/17/2024 12:08  Foot Imaging: Foot-R DG Complete: Results for orders placed in visit on 04/29/24 DG Foot Complete Right  Narrative Please see detailed radiograph report in office note.  Foot-L DG Complete: Results for orders placed during the hospital encounter of 04/12/24 DG Foot Complete Left  Narrative CLINICAL DATA:  Chronic left foot pain.  Left worse infection.  EXAM: LEFT FOOT - COMPLETE 3+ VIEW  COMPARISON:  Left foot radiographs 03/03/2024, 02/19/2024, 11/13/2023, 10/30/2023  FINDINGS: Redemonstration of amputation of the distal aspect of the metatarsal and  proximal aspect of the proximal phalanx of the first ray. Redemonstration of methylmethacrylate antibiotic block fixated with a pain to the distal aspect of the remaining first metatarsal. Minimal interval decrease in moderate persistent regional soft tissue swelling. Resolution of the prior postoperative subcutaneous air.  IMPRESSION: 1. Redemonstration of amputation of the distal aspect of the metatarsal and proximal aspect of the proximal phalanx of the first ray. 2. Minimal interval decrease in moderate persistent regional soft tissue swelling.   Electronically Signed By: Tanda Lyons M.D. On: 04/12/2024 17:47  Complexity Note: Imaging results reviewed.                         Meds   Current Outpatient Medications:    albuterol  (PROVENTIL ) (2.5 MG/3ML) 0.083% nebulizer solution, Take 2.5 mg by nebulization every 6 (six) hours as needed for wheezing or shortness of breath., Disp: , Rfl:    ciclopirox  (PENLAC ) 8 % solution, Apply topically at bedtime. Apply over nail and surrounding skin. Apply daily over previous coat. After seven (7) days, may remove with alcohol and continue cycle., Disp: 6.6 mL, Rfl: 0   gabapentin  (NEURONTIN ) 300 MG capsule, Take 1 capsule (300 mg total) by mouth 3 (three) times daily., Disp: 90 capsule, Rfl: 2   metFORMIN  (GLUCOPHAGE ) 500 MG tablet, TAKE 1 TABLET BY MOUTH 2 TIMES DAILY WITH A MEAL., Disp: 180 tablet, Rfl: 1   metoprolol  tartrate (LOPRESSOR ) 100 MG tablet, TAKE 1 TABLET BY MOUTH TWICE A DAY,  Disp: 180 tablet, Rfl: 1   oxyCODONE -acetaminophen  (PERCOCET) 5-325 MG tablet, Take 1 tablet by mouth every 4 (four) hours as needed for severe pain (pain score 7-10)., Disp: 30 tablet, Rfl: 0   oxyCODONE -acetaminophen  (PERCOCET/ROXICET) 5-325 MG tablet, Take 1 tablet by mouth every 4 (four) hours as needed for up to 5 days for severe pain (pain score 7-10)., Disp: 15 tablet, Rfl: 0   sulfamethoxazole -trimethoprim  (BACTRIM  DS) 800-160 MG tablet, Take 1  tablet by mouth 2 (two) times daily., Disp: 60 tablet, Rfl: 3   SYMBICORT  160-4.5 MCG/ACT inhaler, INHALE 2 PUFFS INTO THE LUNGS TWICE A DAY, Disp: 10.2 each, Rfl: 3   VENTOLIN  HFA 108 (90 Base) MCG/ACT inhaler, INHALE 1 PUFF INTO THE LUNGS EVERY 6 HOURS AS NEEDED FOR WHEEZING OR SHORTNESS OF BREATH., Disp: 18 each, Rfl: 3  ROS  Constitutional: Denies any fever or chills Gastrointestinal: No reported hemesis, hematochezia, vomiting, or acute GI distress Musculoskeletal: Denies any acute onset joint swelling, redness, loss of ROM, or weakness Neurological: No reported episodes of acute onset apraxia, aphasia, dysarthria, agnosia, amnesia, paralysis, loss of coordination, or loss of consciousness  Allergies  Danielle Harrison is allergic to diclofenac.  PFSH  Drug: Danielle Harrison  reports no history of drug use. Alcohol:  reports current alcohol use. Tobacco:  reports that she has quit smoking. Her smoking use included cigarettes. She has a 3 pack-year smoking history. She has never used smokeless tobacco. Medical:  has a past medical history of Acute bacterial endocarditis, Anxiety, Arthritis, Asthma, DM (diabetes mellitus), type 2 (HCC), HTN (hypertension), MRSA bacteremia, Neuromuscular disorder (HCC), Osteomyelitis of lumbar spine (HCC), Osteomyelitis of toe of left foot (HCC), Restless leg syndrome, and SBO (small bowel obstruction) (HCC). 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 Family: family history includes Arthritis in her mother; Heart disease in her father.  Constitutional Exam  General appearance: Well nourished, well developed, and well hydrated. In no apparent acute distress Vitals:   05/03/24 1006  BP: 109/69  Pulse: 77  Resp: 16  Temp: 97.7 F (36.5 C)  TempSrc: Temporal  SpO2: 100%  Weight: 115 lb (52.2 kg)  Height: 5' (1.524 m)   BMI Assessment: Estimated body mass index is 22.46 kg/m as calculated from the following:   Height as of this encounter: 5' (1.524 m).   Weight as of this encounter: 115 lb (52.2 kg).  BMI interpretation table: BMI level Category Range association with higher incidence of chronic pain  <18 kg/m2 Underweight   18.5-24.9 kg/m2 Ideal body weight   25-29.9 kg/m2 Overweight Increased incidence by 20%  30-34.9 kg/m2 Obese (Class I) Increased incidence by 68%  35-39.9 kg/m2 Severe obesity (Class II) Increased incidence by 136%  >40 kg/m2 Extreme obesity (Class III) Increased incidence by 254%   Patient's current BMI Ideal Body weight  Body mass index is 22.46 kg/m. Ideal body weight: 45.5 kg (100 lb 4.9 oz) Adjusted ideal body weight: 48.2 kg (106 lb 3 oz)   BMI Readings from Last 4 Encounters:  05/03/24 22.46 kg/m  04/12/24 22.46 kg/m  03/16/24 21.48 kg/m  02/12/24 21.87 kg/m   Wt Readings from Last 4 Encounters:  05/03/24 115 lb (52.2 kg)  04/12/24 115 lb (52.2 kg)  03/16/24 110 lb (49.9 kg)  02/12/24 112 lb (50.8 kg)    Psych/Mental status: Alert, oriented x 3 (person, place, & time)       Eyes: PERLA Respiratory: No evidence of acute respiratory distress  Assessment & Plan  Primary Diagnosis & Pertinent Problem List: The primary encounter diagnosis was Chronic thoracic back pain (1ry area of Pain) (Right). Diagnoses of Back pain with history of spinal surgery, Chronic low back pain (3ry area of Pain) (Bilateral) w/o sciatica,  Chronic foot pain, left, History of idiopathic transverse myelitis (HCC), Discitis of lumbar region, Epidural abscess, Pathologic fracture of thoracic vertebrae, initial encounter, Abnormal MRI, lumbar spine (01/15/2024), and Abnormal MRI, thoracic spine (11/23/2023) were also pertinent to this visit. Visit Diagnosis: 1. Chronic thoracic back pain (1ry area of Pain) (Right)   2. Back pain with history of spinal surgery   3. Chronic low back pain (3ry area of Pain) (Bilateral) w/o sciatica   4. Chronic foot pain, left   5. History of idiopathic transverse myelitis (HCC)   6. Discitis of lumbar region   7. Epidural abscess   8. Pathologic fracture of thoracic vertebrae, initial encounter   9. Abnormal MRI, lumbar spine (01/15/2024)   10. Abnormal MRI, thoracic spine (11/23/2023)    Problems updated and reviewed during this visit: No problems updated.  Plan of Care  Assessment and Plan    Post-surgical pain in thoracic spine   Persistent post-surgical pain in the upper right thoracic region continues following back surgery in early 2025. The pain is burning and feels like muscle pulling, with some improvement since surgery but remains significant. There are no signs of ongoing infection such as fever or night sweats. Current pain management includes oxycodone  and gabapentin . Due to a history of MRSA infection, there is concern about infection risk with steroid injections. Taper off oxycodone  by reducing to twice a day, then once a day, and then stop completely. Continue gabapentin  300 mg three times a day. Avoid steroid injections until completion of antibiotic therapy and clearance from an infectious disease physician. Discuss pain management options with the primary care physician, including potential transition to tramadol or NSAIDs.  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 She is currently on long-term  antibiotics, possibly bosentan, for at least a year to prevent recurrence due to the presence of surgical hardware. Continue antibiotics as prescribed by the infectious disease physician. Avoid procedures that may compromise the immune system or introduce infection until cleared by the infectious disease physician.  Transverse myelitis   Transverse myelitis was diagnosed in 2010, initially causing paralysis and numbness from the waist down. Some improvement has been noted over time.  Pancreatitis   Pancreatitis preceded the MRSA infection in the spine.      Pharmacotherapy (Medications Ordered): No orders of the defined types were placed in this encounter.  Procedure Orders    No procedure(s) ordered today   No orders of the defined types were placed in this encounter.  Lab Orders  No laboratory test(s) ordered today   Imaging Orders  No imaging studies ordered today   Referral Orders  No referral(s) requested today    Pharmacological management:  Opioid Analgesics: I will not be prescribing any opioids at this time.  I have recommended that she  start going down on the use of her Percocet.  She indicates taking it 3 times a day and I have recommended that she start going down to twice a day later to once a day.  Eventually, she can be switched to tramadol if she continues to require some kind of analgesic.  I am currently not taking any more patients for medication management and therefore my involvement with regards to her pharmacotherapy will be limited to these recommendations. Membrane stabilizer: I will not be prescribing any at this time.  Continue with the gabapentin  that seems to be working well and she finds it to be effective. Muscle relaxant: I will not be prescribing any at this time NSAID: I will not be prescribing any at this time Other analgesic(s): I will not be prescribing any at this time      Interventional Therapies  Risk Factors  Considerations  Medical  Comorbidities:     Planned  Pending:      Under consideration:   No interventional therapies until the patient is clear from infectious diseases and has completed her antibiotic therapy.   Completed: (Analgesic benefit)1  None at this time   Therapeutic  Palliative (PRN) options:   None established   Completed by other providers:   None reported  1(Analgesic benefit): Expressed in percentage (%). (Local anesthetic[LA] +/- sedation  L.A.Local Anesthetic  Steroid benefit  Ongoing benefit)      Provider-requested follow-up: Return if symptoms worsen or fail to improve. Recent Visits Date Type Provider Dept  04/12/24 Office Visit Tanya Glisson, MD [facility]  Showing recent visits within past 90 days and meeting all other requirements Today's Visits Date Type Provider Dept  05/03/24 Office Visit Tanya Glisson, MD [facility]  Showing today's visits and meeting all other requirements Future Appointments No visits were found meeting these conditions. Showing future appointments within next 90 days and meeting all other requirements   Primary Care Physician: Carin Gauze, NP  Duration of encounter: 33 minutes.  Total time on encounter, as per AMA guidelines included both the face-to-face and non-face-to-face time personally spent by the physician and/or other qualified health care professional(s) on the day of the encounter (includes time in activities that require the physician or other qualified health care professional and does not include time in activities normally performed by clinical staff). Physician's time may include the following activities when performed: Preparing to see the patient (e.g., pre-charting review of records, searching for previously ordered imaging, lab work, and nerve conduction tests) Review of prior analgesic pharmacotherapies. Reviewing PMP Interpreting ordered tests (e.g., lab work, imaging, nerve conduction  tests) Performing post-procedure evaluations, including interpretation of diagnostic procedures Obtaining and/or reviewing separately obtained history Performing a medically appropriate examination and/or evaluation Counseling and educating the patient/family/caregiver Ordering medications, tests, or procedures Referring and communicating with other health care professionals (when not separately reported) Documenting clinical information in the electronic or other health record Independently interpreting results (not separately reported) and communicating results to the patient/ family/caregiver Care coordination (not separately reported)  Note by: Glisson DELENA Tanya, MD (TTS technology used. I apologize for any typographical errors that were not detected and corrected.) Date: 05/03/2024; Time: 11:33 AM

## 2024-05-03 ENCOUNTER — Encounter: Payer: Self-pay | Admitting: Pain Medicine

## 2024-05-03 ENCOUNTER — Ambulatory Visit: Attending: Pain Medicine | Admitting: Pain Medicine

## 2024-05-03 VITALS — BP 109/69 | HR 77 | Temp 97.7°F | Resp 16 | Ht 60.0 in | Wt 115.0 lb

## 2024-05-03 DIAGNOSIS — M4646 Discitis, unspecified, lumbar region: Secondary | ICD-10-CM | POA: Insufficient documentation

## 2024-05-03 DIAGNOSIS — M79672 Pain in left foot: Secondary | ICD-10-CM | POA: Diagnosis not present

## 2024-05-03 DIAGNOSIS — M549 Dorsalgia, unspecified: Secondary | ICD-10-CM | POA: Insufficient documentation

## 2024-05-03 DIAGNOSIS — G373 Acute transverse myelitis in demyelinating disease of central nervous system: Secondary | ICD-10-CM | POA: Insufficient documentation

## 2024-05-03 DIAGNOSIS — G062 Extradural and subdural abscess, unspecified: Secondary | ICD-10-CM | POA: Insufficient documentation

## 2024-05-03 DIAGNOSIS — G8929 Other chronic pain: Secondary | ICD-10-CM | POA: Insufficient documentation

## 2024-05-03 DIAGNOSIS — M8448XA Pathological fracture, other site, initial encounter for fracture: Secondary | ICD-10-CM | POA: Insufficient documentation

## 2024-05-03 DIAGNOSIS — Z9889 Other specified postprocedural states: Secondary | ICD-10-CM | POA: Insufficient documentation

## 2024-05-03 DIAGNOSIS — M546 Pain in thoracic spine: Secondary | ICD-10-CM | POA: Insufficient documentation

## 2024-05-03 DIAGNOSIS — R937 Abnormal findings on diagnostic imaging of other parts of musculoskeletal system: Secondary | ICD-10-CM | POA: Insufficient documentation

## 2024-05-03 DIAGNOSIS — M545 Low back pain, unspecified: Secondary | ICD-10-CM | POA: Diagnosis not present

## 2024-05-05 ENCOUNTER — Other Ambulatory Visit: Payer: Self-pay | Admitting: Physician Assistant

## 2024-05-06 ENCOUNTER — Telehealth: Payer: Self-pay | Admitting: Podiatry

## 2024-05-06 NOTE — Telephone Encounter (Signed)
 Patient requesting pain medication. Her preferred pharmacy is [facility] in [place] on [address].

## 2024-05-07 ENCOUNTER — Other Ambulatory Visit: Payer: Self-pay | Admitting: Physician Assistant

## 2024-05-07 ENCOUNTER — Other Ambulatory Visit: Payer: Self-pay | Admitting: Podiatry

## 2024-05-07 ENCOUNTER — Other Ambulatory Visit: Payer: Self-pay | Admitting: Family Medicine

## 2024-05-07 DIAGNOSIS — M4624 Osteomyelitis of vertebra, thoracic region: Secondary | ICD-10-CM

## 2024-05-07 MED ORDER — OXYCODONE-ACETAMINOPHEN 5-325 MG PO TABS
1.0000 | ORAL_TABLET | ORAL | 0 refills | Status: DC | PRN
Start: 2024-05-07 — End: 2024-05-17

## 2024-05-10 ENCOUNTER — Ambulatory Visit: Admitting: Cardiology

## 2024-05-10 ENCOUNTER — Ambulatory Visit (INDEPENDENT_AMBULATORY_CARE_PROVIDER_SITE_OTHER): Admitting: Physician Assistant

## 2024-05-10 ENCOUNTER — Encounter: Payer: Self-pay | Admitting: Physician Assistant

## 2024-05-10 ENCOUNTER — Ambulatory Visit
Admission: RE | Admit: 2024-05-10 | Discharge: 2024-05-10 | Disposition: A | Discharge status: Home / Self Care | Source: Ambulatory Visit | Attending: Physician Assistant | Admitting: Physician Assistant

## 2024-05-10 ENCOUNTER — Ambulatory Visit
Admission: RE | Admit: 2024-05-10 | Discharge: 2024-05-10 | Disposition: A | Discharge status: Home / Self Care | Attending: Physician Assistant | Admitting: Physician Assistant

## 2024-05-10 VITALS — BP 110/76 | Ht 60.0 in | Wt 115.0 lb

## 2024-05-10 DIAGNOSIS — M4624 Osteomyelitis of vertebra, thoracic region: Secondary | ICD-10-CM | POA: Insufficient documentation

## 2024-05-10 DIAGNOSIS — R200 Anesthesia of skin: Secondary | ICD-10-CM

## 2024-05-10 DIAGNOSIS — R208 Other disturbances of skin sensation: Secondary | ICD-10-CM

## 2024-05-10 DIAGNOSIS — Z981 Arthrodesis status: Secondary | ICD-10-CM

## 2024-05-10 DIAGNOSIS — M7918 Myalgia, other site: Secondary | ICD-10-CM | POA: Diagnosis not present

## 2024-05-10 DIAGNOSIS — M8610 Other acute osteomyelitis, unspecified site: Secondary | ICD-10-CM | POA: Diagnosis not present

## 2024-05-10 DIAGNOSIS — M4626 Osteomyelitis of vertebra, lumbar region: Secondary | ICD-10-CM

## 2024-05-10 DIAGNOSIS — M4646 Discitis, unspecified, lumbar region: Secondary | ICD-10-CM | POA: Diagnosis not present

## 2024-05-10 MED ORDER — GABAPENTIN 300 MG PO CAPS: 600.0000 mg | ORAL_CAPSULE | Freq: Three times a day (TID) | ORAL | 2 refills | Status: DC

## 2024-05-10 MED ORDER — TIZANIDINE HCL 4 MG PO TABS: 2.0000 mg | ORAL_TABLET | Freq: Three times a day (TID) | ORAL | 1 refills | Status: DC

## 2024-05-10 NOTE — Progress Notes (Unsigned)
 REFERRING PHYSICIAN:  Carin Gauze, [address]p [address] [place],  [place] [address]  DOS: 11/25/23 T6-7 laminectomy, right transpedicular decompression and disc debridement, T4-T10 posterior spinal fusion.    HISTORY OF PRESENT ILLNESS: Danielle Harrison is approximately 5 months  status post thoracic decompression and fusion as well as debridement for osteomyelitis.  She comes in today complaining of pain at the top of her incision as well as in her right shoulder blade.  She adds that it is sharp in nature with pins-and-needles feelings.  She continues to be followed by infectious disease and is still on oral antibiotics.  PHYSICAL EXAMINATION:  General: Patient is well developed, well nourished, calm, collected, and in no apparent distress.   NEUROLOGICAL:  General: In no acute distress.   Awake, alert, oriented to person, place, and time.  Pupils equal round and reactive to light.  Facial tone is symmetric.   Patient is tender to palpation of her thoracic and lumbar spine.   Strength:            Side Iliopsoas Quads Hamstring PF DF EHL  R 5 5 5 5 5 5   L 5 5 5 5 5 5   Incision is completely healed.   ROS (Neurologic):  Negative except as noted above  IMAGING: 03/02/2024 thoracic MRI with and without contrast  IMPRESSION: Interval posterior fusion of the thoracic spine as above.   Decreased diffuse edema in the T6 and T7 vertebral bodies. Residual edema and enhancement along the endplates at T6-7 and T7-8 concerning for residual discitis/osteomyelitis.   Similar discitis/osteomyelitis at L1-2. Residual edema and enhancement of the L2-3 spinous processes which is decreased from prior concerning for residual discitis/osteomyelitis.   Significant decreased epidural phlegmon/abscess in the thoracic spine compared to study on 11/23/2023. On the current study there is mild residual enhancement along the posterior aspect of T6 and T7 which may reflect small residual epidural  phlegmon. Improved spinal canal patency at these levels.   Redemonstrated compression deformities of the T6 and T7 vertebra without significant interval height loss.   Degenerative changes are similar to prior.    ASSESSMENT/PLAN:  Danielle Harrison is approximately 10 weeks status post thoracic decompression and fusion.  She was then readmitted to the hospital approximately 3 weeks ago with severe low back pain.  MRI showed L1-2 discitis osteomyelitis and L2-3 spinous process osteomyelitis which was new compared to her MRI in February.  There was not IR aspiration of the disc space that was negative for MRSA on culture.  She continues to be followed by infectious disease and has been continuing on antibiotic therapy.  She continues to be in quite a bit of pain in her mid and low back although improved compared to when she was in the hospital.  She describes it as a burning pain, primarily on the right side of her incision that is constant in nature.  She also continues to have numbness primarily in her toes.  No new weakness or changes to bowel and bladder function.  Patient's pain is primarily on the right side.  It could be related to the hardware due to the nature of the surgery.  She feels very tight in this could be contributing to some myofascial pain as well.  Plan includes the following moving forward:  -Physical therapy - Gabapentin  and tizanidine  for pain.  Dosage and side effects discussed at length - We did discuss potential trigger point injections.  She states at this time her pain  provider would not do it given nature of potential infection. - Encouraged over the counter pain regimen including Tylenol  as well as lidocaine  cream and patches. - Advised patient to continue follow-up with infectious disease. - Will review x-ray results for any acute abnormality.  30 minutes spent face-to-face and non-face-to-face on this encounter including preparing to see the patient, obtaining new  history, performing medically appropriate examination, counseling the patient, ordering additional medications, placing referrals, documenting clinical information.   Lyle Decamp PA-C Department of neurosurgery

## 2024-05-10 NOTE — Addendum Note (Signed)
 Addended by: SEBASTIAN ELENOR HERO on: 05/10/2024 12:56 PM   Modules accepted: Orders

## 2024-05-11 ENCOUNTER — Ambulatory Visit: Admitting: Podiatry

## 2024-05-13 ENCOUNTER — Ambulatory Visit: Attending: Infectious Diseases | Admitting: Infectious Diseases

## 2024-05-13 ENCOUNTER — Encounter: Payer: Self-pay | Admitting: Infectious Diseases

## 2024-05-13 ENCOUNTER — Telehealth: Payer: Self-pay | Admitting: Podiatry

## 2024-05-13 ENCOUNTER — Ambulatory Visit: Payer: Self-pay

## 2024-05-13 ENCOUNTER — Other Ambulatory Visit
Admission: RE | Admit: 2024-05-13 | Discharge: 2024-05-13 | Disposition: A | Discharge status: Home / Self Care | Source: Ambulatory Visit | Attending: Infectious Diseases | Admitting: Infectious Diseases

## 2024-05-13 VITALS — BP 109/75 | HR 74 | Temp 97.2°F | Ht <= 58 in | Wt 117.0 lb

## 2024-05-13 DIAGNOSIS — Z7984 Long term (current) use of oral hypoglycemic drugs: Secondary | ICD-10-CM | POA: Diagnosis not present

## 2024-05-13 DIAGNOSIS — M4620 Osteomyelitis of vertebra, site unspecified: Secondary | ICD-10-CM

## 2024-05-13 DIAGNOSIS — E119 Type 2 diabetes mellitus without complications: Secondary | ICD-10-CM | POA: Diagnosis not present

## 2024-05-13 DIAGNOSIS — F17290 Nicotine dependence, other tobacco product, uncomplicated: Secondary | ICD-10-CM | POA: Insufficient documentation

## 2024-05-13 DIAGNOSIS — M4626 Osteomyelitis of vertebra, lumbar region: Secondary | ICD-10-CM | POA: Diagnosis not present

## 2024-05-13 DIAGNOSIS — Z792 Long term (current) use of antibiotics: Secondary | ICD-10-CM | POA: Diagnosis not present

## 2024-05-13 DIAGNOSIS — M4624 Osteomyelitis of vertebra, thoracic region: Secondary | ICD-10-CM | POA: Diagnosis not present

## 2024-05-13 DIAGNOSIS — M0009 Staphylococcal polyarthritis: Secondary | ICD-10-CM

## 2024-05-13 DIAGNOSIS — Z9889 Other specified postprocedural states: Secondary | ICD-10-CM | POA: Diagnosis not present

## 2024-05-13 DIAGNOSIS — Z981 Arthrodesis status: Secondary | ICD-10-CM | POA: Insufficient documentation

## 2024-05-13 DIAGNOSIS — B9562 Methicillin resistant Staphylococcus aureus infection as the cause of diseases classified elsewhere: Secondary | ICD-10-CM | POA: Diagnosis not present

## 2024-05-13 DIAGNOSIS — R7881 Bacteremia: Secondary | ICD-10-CM

## 2024-05-13 LAB — COMPREHENSIVE METABOLIC PANEL WITH GFR
ALT: 19 U/L (ref 0–44)
AST: 18 U/L (ref 15–41)
Albumin: 4.1 g/dL (ref 3.5–5.0)
Alkaline Phosphatase: 95 U/L (ref 38–126)
Anion gap: 9 (ref 5–15)
BUN: 16 mg/dL (ref 6–20)
CO2: 25 mmol/L (ref 22–32)
Calcium: 9.7 mg/dL (ref 8.9–10.3)
Chloride: 102 mmol/L (ref 98–111)
Creatinine, Ser: 0.76 mg/dL (ref 0.44–1.00)
GFR, Estimated: >60 mL/min (ref >60)
Glucose, Bld: 185 mg/dL — ABNORMAL HIGH (ref 70–99)
Potassium: 4.5 mmol/L (ref 3.5–5.1)
Sodium: 136 mmol/L (ref 135–145)
Total Bilirubin: 0.4 mg/dL (ref 0.0–1.2)
Total Protein: 7.1 g/dL (ref 6.5–8.1)

## 2024-05-13 LAB — SEDIMENTATION RATE: Sed Rate: 11 mm/h (ref 0–20)

## 2024-05-13 LAB — C-REACTIVE PROTEIN: CRP: 0.8 mg/dL (ref <1.0)

## 2024-05-13 MED ORDER — SULFAMETHOXAZOLE-TRIMETHOPRIM 800-160 MG PO TABS: 1.0000 | ORAL_TABLET | Freq: Two times a day (BID) | ORAL | 3 refills | Status: DC

## 2024-05-13 NOTE — Telephone Encounter (Signed)
 Patient requesting pain medication. Her preferred pharmacy is [facility] in [place] on [address].

## 2024-05-13 NOTE — Progress Notes (Signed)
 NAME: Danielle Harrison  DOB: 1976-07-14  MRN: [id number]  Date/Time: 05/13/2024 9:03 AM   Subjective:  Here with her partner ?follow up visit for MRSA spine infectioninfection on bactrim  - pt has interscapular sharp pain, no fever or chills, saw neurosurgery on 7/28. Xray thoracic spine -screws and rod present-    On 03/03/24 she underwent Partial resection of 1st metatarsal and 1st proximal phalanx, Left foot 2. Removal and reinsertion of antibiotic cement spacer non biodegradable implant, left foot Saw Dr.Standiford after that. Asked not to WB on that foot but she seems to be walking on it Repeat MRI on the spine done on 03/02/24    Complicated infectious history  Danielle Harrison is a 48 y.o. Was in the hospital between 10/18/2023 until 11/04/2023 for acute pancreatitis but also developed MRSA bacteremia 5 days into hospital stay ( BC positive on 1/9, 1/11 and 1/12) and T8-T9 thoracic vertebral osteomyelitis/discitis and left great toe septic arthritis for which she underwent Left foot first MTP joint resection including head of the first metatarsal and base of proximal phalanx.  This was done on 10/30/2023.  Culture was positive for MRSA.  TEE was negative on 10/29/23.  Repeat blood culture done on 10/30/2023 was negative for MRSA.  Patient was discharged on daptomycin  IV for minimum of 6 weeks.  The daptomycin  MIC was susceptible at 0.5 mcg.    She presented to urgent care on 11/16/2023 with muscle spasm in her chest.  No workup was done that day.  She was prescribed prednisone  and heating pad and wrapping of the chest wall.   She saw her PCP on 11/21/2023 and was still complaining of pain and she ordered a thoracic spine x-ray which showed progressive disc space narrowing and wedging of T6 and T7 worrisome for discitis and osteomyelitis and pathological compression fracture.   She came back to [facility] ED on 11/23/2023 complaining of worsening pain and it was hurting for her to talk turn twist or move.She had  been 100% adherent to Daptomycin  . MRI showed epidural abscess and nerve compression after 23 days of IV dapto She underwent surgery on 11/25/23 1. Posterior Segmental Instrumentation T4 to T9 2. Posterolateral arthrodesis from T4 to T9 3. Thoracic lamina to me at T6 and T7 4.  Right sided transpedicular disc debridement and washout of ventral epidural abscess 5.  Autograft harvest spinous processes 6.  Allograft placement Culture was MRSA- sensitive to Dapt MIC<1 She was this time treated with vancomycin  and discharged home on it along with rifampin  . There has been some fluctuation in vanco levels- intially low and vanco was increased to Q8. Cr has been steady She had been adherent to vanco Q8 but has not been sticking to the correct time, so the level of vanco has been high I saw her on 12/30/23 and She was doing much better Pain much improved- some present on the mid back The plan was to finish 8 weeks of Iv vanco and Po rifampin  on 4/7 and do bactrim  and rifampin  for 4-6 weeks. On 4/1 /25 labs showed normal ESR and CRP  She then apparently went to work for a week- ( house cleaning) She came back to the ED 01/15/24 with severe low back pain An MRI of lumbar spine showed Positive for L1-L2 Discitis Osteomyelitis, and L2-L3 spinous process Osteomyelitis, which are new since the Lumbar MRI on 10/23/2023, though the MRI thoracic spine done on 11/23/23 showed abnormal T1 and T2 signal intensity and abnormal enhancement  in the L2 vertebral body suspicious for osteomyelitis. IR aspirated the disc space and spinous process and culture was neg for MRSA PT was sent on Po Linezolid  and PO rifampin  on 4/9 On 4/15 rifampin  stopped due  nausea, vomiting diarrhea and heart burn- , and on 4/17  linezolid   DC 4/17  started on Bactrim  She has been on it since then  MRI of thoracic and lumbar spine done on 03/02/24 Decreased diffuse edema in the T6 and T7 vertebral bodies. Residual edema and enhancement along  the endplates at T6-7 and T7-8 concerning for residual discitis/osteomyelitis. Epidural abscess much improved   Similar discitis/osteomyelitis at L1-2. Residual edema and enhancement of the L2-3 spinous processes which is decreased from prior concerning for residual discitis/osteomyelitis.  05/10/24 Xray okay    Past Medical History:  Diagnosis Date   Acute bacterial endocarditis    Anxiety    Arthritis    back   Asthma    DM (diabetes mellitus), type 2 (HCC)    HTN (hypertension)    MRSA bacteremia    Neuromuscular disorder (HCC)    weakness and numbness    Osteomyelitis of lumbar spine (HCC)    Osteomyelitis of toe of left foot (HCC)    Restless leg syndrome    SBO (small bowel obstruction) (HCC)     Past Surgical History:  Procedure Laterality Date   APPLICATION OF INTRAOPERATIVE CT SCAN N/A 11/25/2023   Procedure: APPLICATION OF INTRAOPERATIVE CT SCAN;  Surgeon: Claudene Penne ORN, MD;  Location: [facility];  Service: Neurosurgery;  Laterality: N/A;   BIOPSY  06/28/2019   Procedure: BIOPSY;  Surgeon: Janalyn Keene NOVAK, MD;  Location: [facility];  Service: Endoscopy;;   CESAREAN SECTION     x2   COLONOSCOPY WITH PROPOFOL  N/A 06/28/2019   Procedure: COLONOSCOPY WITH PROPOFOL ;  Surgeon: Janalyn Keene NOVAK, MD;  Location: [facility];  Service: Endoscopy;  Laterality: N/A;   ESOPHAGOGASTRODUODENOSCOPY (EGD) WITH PROPOFOL  N/A 06/28/2019   Procedure: ESOPHAGOGASTRODUODENOSCOPY (EGD) WITH PROPOFOL ;  Surgeon: Janalyn Keene NOVAK, MD;  Location: [facility];  Service: Endoscopy;  Laterality: N/A;   INCISION AND DRAINAGE Left 10/27/2023   Procedure: INCISION AND DRAINAGE LEFT FOOT;  Surgeon: Malvin Marsa FALCON, DPM;  Location: [facility];  Service: Orthopedics/Podiatry;  Laterality: Left;   INCISION AND DRAINAGE Left 10/30/2023   Procedure: LEFT FOOT FIRST METATARSAL PHALANGEAL JOINT RESECTION, ANTIBIOTIC SPACER, WASHOUT, WOUND CLOSURE;  Surgeon:  Malvin Marsa FALCON, DPM;  Location: [facility];  Service: Orthopedics/Podiatry;  Laterality: Left;   IR LUMBAR DISC ASPIRATION W/IMG GUIDE  01/19/2024   METATARSAL HEAD EXCISION Left 03/03/2024   Procedure: EXCISION, METATARSAL BONE, HEAD;  Surgeon: Malvin Marsa FALCON, DPM;  Location: [facility];  Service: Orthopedics/Podiatry;  Laterality: Left;  LEFT FOOT REMOVAL AND REINSTERTION OF CEMENT SPACER   TEE WITHOUT CARDIOVERSION N/A 10/29/2023   Procedure: TRANSESOPHAGEAL ECHOCARDIOGRAM (TEE);  Surgeon: Perla Evalene PARAS, MD;  Location: [facility];  Service: Cardiovascular;  Laterality: N/A;   THORACIC LAMINECTOMY FOR EPIDURAL ABSCESS N/A 11/25/2023   Procedure: T6-T7 thoracic laminectomy, right sided transpedicular decompression and disc debridement;  Surgeon: Claudene Penne ORN, MD;  Location: [facility];  Service: Neurosurgery;  Laterality: N/A;   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   Procedure: LEFT FOOT 1ST MPJ RESECTION;  Surgeon: Malvin Marsa FALCON, DPM;  Location: [facility];  Service: Orthopedics/Podiatry;  Laterality: Left;   TRANSMETATARSAL AMPUTATION Left 10/30/2023   Procedure: LEFT FOOT 1ST MPJ RESECTION;  Surgeon: Malvin Marsa FALCON, DPM;  Location: [facility];  Service: Orthopedics/Podiatry;  Laterality: Left;    Social History   Socioeconomic History   Marital status: Married    Spouse name: Not on file   Number of children: Not on file   Years of education: Not on file   Highest education level: Not on file  Occupational History   Not on file  Tobacco Use   Smoking status: Former    Current packs/day: 0.50    Average packs/day: 0.5 packs/day for 6.0 years (3.0 ttl pk-yrs)    Types: Cigarettes   Smokeless tobacco: Never   Tobacco comments:    Quit 8/22  Vaping Use   Vaping status: Every Day   Substances: Nicotine, Flavoring  Substance and Sexual Activity   Alcohol use: Yes    Comment: rare   Drug use: Never   Sexual activity: Not on file  Other Topics  Concern   Not on file  Social History Narrative   Not on file   Social Drivers of Health   Financial Resource Strain: Not on file  Food Insecurity: No Food Insecurity (01/15/2024)   Hunger Vital Sign    Worried About Running Out of Food in the Last Year: Never true    Ran Out of Food in the Last Year: Never true  Transportation Needs: No Transportation Needs (01/15/2024)   PRAPARE - Administrator, Civil Service (Medical): No    Lack of Transportation (Non-Medical): No  Physical Activity: Not on file  Stress: Not on file  Social Connections: Not on file  Intimate Partner Violence: Not At Risk (01/15/2024)   Humiliation, Afraid, Rape, and Kick questionnaire    Fear of Current or Ex-Partner: No    Emotionally Abused: No    Physically Abused: No    Sexually Abused: No    Family History  Problem Relation Age of Onset   Arthritis Mother    Heart disease Father    Colon cancer Neg Hx    Allergies  Allergen Reactions   Diclofenac Hives   I? Current Outpatient Medications  Medication Sig Dispense Refill   albuterol  (PROVENTIL ) (2.5 MG/3ML) 0.083% nebulizer solution Take 2.5 mg by nebulization every 6 (six) hours as needed for wheezing or shortness of breath.     ciclopirox  (PENLAC ) 8 % solution APPLY TOPICALLY AT BEDTIME. APPLY OVER NAIL AND SURROUNDING SKIN. APPLY DAILY OVER PREVIOUS COAT. AFTER SEVEN (7) DAYS, MAY REMOVE WITH ALCOHOL AND CONTINUE CYCLE. 6.6 mL 0   gabapentin  (NEURONTIN ) 300 MG capsule Take 2 capsules (600 mg total) by mouth 3 (three) times daily. 90 capsule 2   metFORMIN  (GLUCOPHAGE ) 500 MG tablet TAKE 1 TABLET BY MOUTH 2 TIMES DAILY WITH A MEAL. 180 tablet 1   metoprolol  tartrate (LOPRESSOR ) 100 MG tablet TAKE 1 TABLET BY MOUTH TWICE A DAY 180 tablet 1   oxyCODONE -acetaminophen  (PERCOCET) 5-325 MG tablet Take 1 tablet by mouth every 4 (four) hours as needed for severe pain (pain score 7-10). 30 tablet 0   sulfamethoxazole -trimethoprim  (BACTRIM  DS) 800-160  MG tablet Take 1 tablet by mouth 2 (two) times daily. 60 tablet 3   SYMBICORT  160-4.5 MCG/ACT inhaler INHALE 2 PUFFS INTO THE LUNGS TWICE A DAY 10.2 each 3   tiZANidine  (ZANAFLEX ) 4 MG tablet Take 0.5-1 tablets (2-4 mg total) by mouth 3 (three) times daily. 90 tablet 1   VENTOLIN  HFA 108 (90 Base) MCG/ACT inhaler INHALE 1 PUFF INTO THE LUNGS EVERY 6 HOURS AS NEEDED FOR WHEEZING OR SHORTNESS OF  BREATH. 18 each 3   No current facility-administered medications for this visit.     Abtx:  Anti-infectives (From admission, onward)    None       REVIEW OF SYSTEMS:  Const: negative fever, negative chills, negative weight loss Eyes: negative diplopia or visual changes, negative eye pain ENT: negative coryza, negative sore throat Resp: negative cough, hemoptysis, dyspnea Cards: negative for chest pain, palpitations, lower extremity edema GU: negative for frequency, dysuria and hematuria GI: Negative for abdominal pain, diarrhea, bleeding, constipation Skin: negative for rash and pruritus Heme: negative for easy bruising and gum/nose bleeding MS: back pain  Neurolo:negative for headaches, dizziness, vertigo, memory problems  Psych: negative for feelings of anxiety, depression  Endocrine: negative for thyroid, diabetes Allergy/Immunology- diclofenac Objective:  VITALS:  BP 109/75   Pulse 74   Temp (!) 97.2 F (36.2 C) (Temporal)   Ht 4' 9 (1.448 m)   Wt 117 lb (53.1 kg)   LMP 03/08/2024   SpO2 99%   BMI 25.32 kg/m   LDA PICC PHYSICAL EXAM:  General: Alert, cooperative, no distress, appears stated age.  Head: Normocephalic, without obvious abnormality, atraumatic. Eyes: Conjunctivae clear, anicteric sclerae. Pupils are equal ENT Nares normal. No drainage or sinus tenderness. Lips, mucosa, and tongue normal. No Thrush Neck: Supple, symmetrical, no adenopathy, thyroid: non tender no carotid bruit and no JVD. Back: thoracic scar healed well Lungs: Clear to auscultation  bilaterally. No Wheezing or Rhonchi. No rales. Heart: Regular rate and rhythm, no murmur, rub or gallop. Abdomen: Soft, non-tender,not distended. Bowel sounds normal. No masses Extremities:left foot dressing not removed Skin: No rashes or lesions. Or bruising Lymph: Cervical, supraclavicular normal. Neurologic: Grossly non-focal Pertinent Labs   05/10/24 XRAY spine rods and screws T4-T9  ? Impression/Recommendation ?MRSA bacteremia, Extensive spinal infection, left great toe septic arthritis- s/p thoracic spine fusion ?MRSA bacteremia in Jan 2025 with T6-T7 discitis  and left great toe septic arthritis and osteo Shewas  on appropriate antibiotic after  23 days of Iv daptomycin  there was progression of the vertebral osteomyelitis  with epidural abscess and nerve compression causing severe pain. She underwent surgery on 11/25/23 1. Posterior Segmental Instrumentation T4 to T9 2. Posterolateral arthrodesis from T4 to T9 3. Thoracic lamina to me at T6 and T7 4.  Right sided transpedicular disc debridement and washout of ventral epidural abscess 5.  Autograft harvest spinous processes 6.  Allograft placement  culture 11/25/23 was MRSA ( susceptible to vanco and dapto)  She was given IV  vancomycin  and PO rifampin   Sed rate /crp normalized While on the 8th week of Antibiotic she was readmitted In April 2025 for lumbar pain  and MRI showed L1-L2 discitis and osteo and L3 spinous process involvement She underwent aspiration from the disc space and spinous process by IR  on 01/19/24 and the culture was no growth She was sent home on 4/9 on linezolid  and rifampin  Both the antibotics stopped due to side effects on 4/15 and 4/17   'been on bactrim  since then  and doing okay Pain is still a problem - taking ibuprofen --doubt whether she would be pain free ESR and CRP have been N for many months Plan is to continue anti MRSA antibiotic  for atleast a year because of hardware and  may be  indefinitely While on bactrim  monitor K and cr  With NSAID risk for kidney injury  She has baseline lumbar deg disc disease and is followed by pain management at [facility]  Left great toe infection with septic arthritis and osteo of the Met- s/p resection- site has healed well Bony enlargement and was painful and podiatrist did further resection and decreased the size of the spacer on 03/03/24 and culture was negative   H/o pancreatitis  DM on metformin    Will do labs today   Discussed the management with the patient and her partner Labs today  Follow up 3 months or earlier if needed

## 2024-05-17 ENCOUNTER — Other Ambulatory Visit: Payer: Self-pay | Admitting: Podiatry

## 2024-05-17 MED ORDER — OXYCODONE-ACETAMINOPHEN 5-325 MG PO TABS: 1.0000 | ORAL_TABLET | ORAL | 0 refills | Status: DC | PRN

## 2024-05-18 ENCOUNTER — Ambulatory Visit: Admitting: Infectious Diseases

## 2024-05-19 DIAGNOSIS — M5416 Radiculopathy, lumbar region: Secondary | ICD-10-CM | POA: Diagnosis not present

## 2024-05-19 DIAGNOSIS — M546 Pain in thoracic spine: Secondary | ICD-10-CM | POA: Diagnosis not present

## 2024-05-24 DIAGNOSIS — M5416 Radiculopathy, lumbar region: Secondary | ICD-10-CM | POA: Diagnosis not present

## 2024-05-24 DIAGNOSIS — M546 Pain in thoracic spine: Secondary | ICD-10-CM | POA: Diagnosis not present

## 2024-05-25 ENCOUNTER — Telehealth: Payer: Self-pay | Admitting: Podiatry

## 2024-05-25 ENCOUNTER — Other Ambulatory Visit: Payer: Self-pay | Admitting: Podiatry

## 2024-05-25 MED ORDER — OXYCODONE-ACETAMINOPHEN 5-325 MG PO TABS: 1.0000 | ORAL_TABLET | ORAL | 0 refills | Status: DC | PRN

## 2024-05-25 NOTE — Telephone Encounter (Signed)
 Patient requesting pain medication. Her preferred pharmacy is [facility] in [place] on [address].

## 2024-05-26 DIAGNOSIS — M5416 Radiculopathy, lumbar region: Secondary | ICD-10-CM | POA: Diagnosis not present

## 2024-05-26 DIAGNOSIS — M546 Pain in thoracic spine: Secondary | ICD-10-CM | POA: Diagnosis not present

## 2024-06-01 ENCOUNTER — Telehealth: Payer: Self-pay | Admitting: Podiatry

## 2024-06-01 ENCOUNTER — Other Ambulatory Visit: Payer: Self-pay | Admitting: Lab

## 2024-06-01 DIAGNOSIS — M778 Other enthesopathies, not elsewhere classified: Secondary | ICD-10-CM

## 2024-06-01 DIAGNOSIS — M86072 Acute hematogenous osteomyelitis, left ankle and foot: Secondary | ICD-10-CM

## 2024-06-01 DIAGNOSIS — M2011 Hallux valgus (acquired), right foot: Secondary | ICD-10-CM

## 2024-06-01 DIAGNOSIS — M19079 Primary osteoarthritis, unspecified ankle and foot: Secondary | ICD-10-CM

## 2024-06-01 DIAGNOSIS — M205X1 Other deformities of toe(s) (acquired), right foot: Secondary | ICD-10-CM

## 2024-06-01 NOTE — Telephone Encounter (Signed)
 Patient requesting refill: oxyCODONE -acetaminophen  (PERCOCET) 5-325 MG tablet  Sent to [facility] on file. Last filled 05/25/2024 for 30 tabs

## 2024-06-02 ENCOUNTER — Other Ambulatory Visit: Payer: Self-pay | Admitting: Podiatry

## 2024-06-02 MED ORDER — OXYCODONE-ACETAMINOPHEN 5-325 MG PO TABS: 1.0000 | ORAL_TABLET | ORAL | 0 refills | Status: DC | PRN

## 2024-06-03 ENCOUNTER — Other Ambulatory Visit: Payer: Self-pay | Admitting: Physician Assistant

## 2024-06-07 DIAGNOSIS — M5416 Radiculopathy, lumbar region: Secondary | ICD-10-CM | POA: Diagnosis not present

## 2024-06-07 DIAGNOSIS — M546 Pain in thoracic spine: Secondary | ICD-10-CM | POA: Diagnosis not present

## 2024-06-09 DIAGNOSIS — M546 Pain in thoracic spine: Secondary | ICD-10-CM | POA: Diagnosis not present

## 2024-06-09 DIAGNOSIS — M5416 Radiculopathy, lumbar region: Secondary | ICD-10-CM | POA: Diagnosis not present

## 2024-06-10 ENCOUNTER — Ambulatory Visit (INDEPENDENT_AMBULATORY_CARE_PROVIDER_SITE_OTHER): Admitting: Podiatry

## 2024-06-10 ENCOUNTER — Encounter: Payer: Self-pay | Admitting: Podiatry

## 2024-06-10 DIAGNOSIS — M2011 Hallux valgus (acquired), right foot: Secondary | ICD-10-CM | POA: Diagnosis not present

## 2024-06-10 DIAGNOSIS — M19079 Primary osteoarthritis, unspecified ankle and foot: Secondary | ICD-10-CM

## 2024-06-10 DIAGNOSIS — M86072 Acute hematogenous osteomyelitis, left ankle and foot: Secondary | ICD-10-CM | POA: Diagnosis not present

## 2024-06-10 MED ORDER — OXYCODONE-ACETAMINOPHEN 5-325 MG PO TABS: 1.0000 | ORAL_TABLET | ORAL | 0 refills | Status: AC | PRN

## 2024-06-10 MED ORDER — MELOXICAM 15 MG PO TABS: 15.0000 mg | ORAL_TABLET | Freq: Every day | ORAL | 0 refills | Status: DC

## 2024-06-10 NOTE — Progress Notes (Signed)
 Subjective:  Patient ID: Danielle Harrison Free, female    DOB: [birth year],  MRN: [id number]  Chief Complaint  Patient presents with   Wound Check    F/U Partial resection of 1st metatarsal and 1st proximal phalanx Left. 8 pain. NIDDM A1C 7.3 Plantar area 1st met sore. Using tens unit for a few days.    DOS: 03/03/2024 Procedure: 1. Partial resection of 1st metatarsal and 1st proximal phalanx, Left foot 2. Removal and reinsertion of antibiotic cement spacer non biodegradable implant, left foot  48 y.o. female seen for post op check.  Patient seen follow-up on left foot first metatarsal and first proximal phalanx resection with antibiotic spacer exchange.  She reports she does have some continued pain in the left first MPJ though feels like the swelling is gone down.  She reports she is doing much better than she had been previously though still with some pain.  Asking about orthotics.  Review of Systems: Negative except as noted in the HPI. Denies N/V/F/Ch.   Objective:   Constitutional Well developed. Well nourished.  Vascular Foot warm and well perfused. Capillary refill normal to all digits.   No calf pain with palpation  Neurologic Normal speech. Oriented to person, place, and time. Epicritic sensation diminished to left foot  Dermatologic Decreased edema about the left foot first MPJ some tenderness to palpation at the plantar aspect of the spacer though slightly improved overall clinically.  There is no open wound present    Orthopedic: Status post repeat resection of left first metatarsal and first proximal phalanx with removal and replacement of antibiotic cement spacer with K wire fixation.  Decreasing edema of the first ray, mild tenderness palpation  On the right foot first MPJ there is noted the osseous spurring both dorsally and medially at the first metatarsal head consistent with osteoarthritis pain with flexion and extension of the first MPJ.   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  Pathology: None taken 03/03/2024 IntraOp  Micro: 03/03/2024 wound swab culture from around the antibiotic cement spacer no growth   Assessment:   Painful and oversized antibiotics cement spacer status post removal and reinsertion of a smaller spacer with K wire fixation  Plan:  Patient was evaluated and treated and all questions answered.  8 weeks s/p above procedure with smaller cement spacer placed -Progressing as expected postop, improving with decreased swelling and pain  -Recommend we proceed with custom made orthotics for both feet to try and offload the first MPJ area bilaterally - No wound care needed -Continue on Bactrim  which she is taking for a year for her back which should cover her foot as well. -XR: Deferred today -WB Status: Weightbearing as tolerated in regular shoe gear as preferred -Sutures: Previously removed -Medications/ABX: Supplied a small refill of narcotic pain medication further pain meds per pain management -Dressing: No dressing required   Regards to the right foot: # Hallux valgus and hallux rigidus of the first MPJ - Continue anti-inflammatory therapy and orthotics as above - F/u Plan: Follow-up in 6 weeks        Marolyn JULIANNA Honour, DPM [facility]

## 2024-06-17 ENCOUNTER — Other Ambulatory Visit: Payer: Self-pay | Admitting: Podiatry

## 2024-06-17 ENCOUNTER — Telehealth: Payer: Self-pay | Admitting: Podiatry

## 2024-06-17 MED ORDER — OXYCODONE-ACETAMINOPHEN 5-325 MG PO TABS: 1.0000 | ORAL_TABLET | ORAL | 0 refills | Status: DC | PRN

## 2024-06-17 NOTE — Telephone Encounter (Signed)
 Patient called requesting a refill of pain medication

## 2024-06-21 DIAGNOSIS — M546 Pain in thoracic spine: Secondary | ICD-10-CM | POA: Diagnosis not present

## 2024-06-21 DIAGNOSIS — M5416 Radiculopathy, lumbar region: Secondary | ICD-10-CM | POA: Diagnosis not present

## 2024-06-24 ENCOUNTER — Telehealth: Payer: Self-pay | Admitting: Podiatry

## 2024-06-24 NOTE — Telephone Encounter (Signed)
 Pt needs a refill on her pain medication Oxycodone 

## 2024-06-25 ENCOUNTER — Telehealth: Payer: Self-pay | Admitting: Lab

## 2024-06-25 NOTE — Telephone Encounter (Signed)
 Patient calling for refill on pain medication her appointment with pain management is 06/30/2024.

## 2024-06-28 ENCOUNTER — Other Ambulatory Visit: Payer: Self-pay | Admitting: Podiatry

## 2024-06-28 MED ORDER — OXYCODONE-ACETAMINOPHEN 5-325 MG PO TABS: 1.0000 | ORAL_TABLET | ORAL | 0 refills | Status: AC | PRN

## 2024-06-28 NOTE — Telephone Encounter (Signed)
 Thank you Dr. Malvin.

## 2024-06-28 NOTE — Telephone Encounter (Signed)
 Chart reviewed. RX was sent 06-17-24 for oxycodone  po q6h prn. Qty 30 tablets. Patient has appt with pain management on 06/30/2024, per previous note from I. Herold, CMA. Do you want to authorize a refill to hold patient over until she is seen by pain management. If so, please authorize and thank you.

## 2024-06-30 DIAGNOSIS — M79672 Pain in left foot: Secondary | ICD-10-CM | POA: Diagnosis not present

## 2024-06-30 DIAGNOSIS — Z131 Encounter for screening for diabetes mellitus: Secondary | ICD-10-CM | POA: Diagnosis not present

## 2024-06-30 DIAGNOSIS — D539 Nutritional anemia, unspecified: Secondary | ICD-10-CM | POA: Diagnosis not present

## 2024-06-30 DIAGNOSIS — E559 Vitamin D deficiency, unspecified: Secondary | ICD-10-CM | POA: Diagnosis not present

## 2024-06-30 DIAGNOSIS — M129 Arthropathy, unspecified: Secondary | ICD-10-CM | POA: Diagnosis not present

## 2024-06-30 DIAGNOSIS — Z79899 Other long term (current) drug therapy: Secondary | ICD-10-CM | POA: Diagnosis not present

## 2024-06-30 DIAGNOSIS — M549 Dorsalgia, unspecified: Secondary | ICD-10-CM | POA: Diagnosis not present

## 2024-06-30 DIAGNOSIS — Z1159 Encounter for screening for other viral diseases: Secondary | ICD-10-CM | POA: Diagnosis not present

## 2024-06-30 DIAGNOSIS — R0602 Shortness of breath: Secondary | ICD-10-CM | POA: Diagnosis not present

## 2024-06-30 DIAGNOSIS — E7800 Pure hypercholesterolemia, unspecified: Secondary | ICD-10-CM | POA: Diagnosis not present

## 2024-06-30 DIAGNOSIS — F17290 Nicotine dependence, other tobacco product, uncomplicated: Secondary | ICD-10-CM | POA: Diagnosis not present

## 2024-06-30 DIAGNOSIS — R5383 Other fatigue: Secondary | ICD-10-CM | POA: Diagnosis not present

## 2024-06-30 DIAGNOSIS — M79671 Pain in right foot: Secondary | ICD-10-CM | POA: Diagnosis not present

## 2024-07-06 ENCOUNTER — Other Ambulatory Visit: Payer: Self-pay | Admitting: Cardiology

## 2024-07-06 ENCOUNTER — Other Ambulatory Visit: Payer: Self-pay | Admitting: Physician Assistant

## 2024-07-06 ENCOUNTER — Other Ambulatory Visit: Payer: Self-pay | Admitting: Podiatry

## 2024-07-07 DIAGNOSIS — T402X5A Adverse effect of other opioids, initial encounter: Secondary | ICD-10-CM | POA: Diagnosis not present

## 2024-07-07 DIAGNOSIS — F17210 Nicotine dependence, cigarettes, uncomplicated: Secondary | ICD-10-CM | POA: Diagnosis not present

## 2024-07-07 DIAGNOSIS — M79671 Pain in right foot: Secondary | ICD-10-CM | POA: Diagnosis not present

## 2024-07-07 DIAGNOSIS — Z79899 Other long term (current) drug therapy: Secondary | ICD-10-CM | POA: Diagnosis not present

## 2024-07-07 DIAGNOSIS — M79604 Pain in right leg: Secondary | ICD-10-CM | POA: Diagnosis not present

## 2024-07-07 DIAGNOSIS — M961 Postlaminectomy syndrome, not elsewhere classified: Secondary | ICD-10-CM | POA: Diagnosis not present

## 2024-07-07 DIAGNOSIS — E559 Vitamin D deficiency, unspecified: Secondary | ICD-10-CM | POA: Diagnosis not present

## 2024-07-07 DIAGNOSIS — R2681 Unsteadiness on feet: Secondary | ICD-10-CM | POA: Diagnosis not present

## 2024-07-07 DIAGNOSIS — M5134 Other intervertebral disc degeneration, thoracic region: Secondary | ICD-10-CM | POA: Diagnosis not present

## 2024-07-07 DIAGNOSIS — M79605 Pain in left leg: Secondary | ICD-10-CM | POA: Diagnosis not present

## 2024-07-07 DIAGNOSIS — E1142 Type 2 diabetes mellitus with diabetic polyneuropathy: Secondary | ICD-10-CM | POA: Diagnosis not present

## 2024-07-07 DIAGNOSIS — M79672 Pain in left foot: Secondary | ICD-10-CM | POA: Diagnosis not present

## 2024-07-08 ENCOUNTER — Telehealth: Payer: Self-pay

## 2024-07-08 NOTE — Telephone Encounter (Signed)
 Patient requesting OV notes be sent to her pain management doctor with [facility] Calla Fear)  Fax: [phone] Office visit notes faxed Brandan Robicheaux ONEIDA Ligas, CMA

## 2024-07-08 NOTE — Telephone Encounter (Signed)
-----   Message from [facility] Wanette Robison G sent at 07/06/2024  4:35 PM EDT ----- Fax records to pain management heron golden

## 2024-07-12 ENCOUNTER — Ambulatory Visit

## 2024-07-12 DIAGNOSIS — M86072 Acute hematogenous osteomyelitis, left ankle and foot: Secondary | ICD-10-CM

## 2024-07-12 DIAGNOSIS — M2011 Hallux valgus (acquired), right foot: Secondary | ICD-10-CM

## 2024-07-12 DIAGNOSIS — M216X2 Other acquired deformities of left foot: Secondary | ICD-10-CM

## 2024-07-12 DIAGNOSIS — M216X1 Other acquired deformities of right foot: Secondary | ICD-10-CM

## 2024-07-12 DIAGNOSIS — M205X1 Other deformities of toe(s) (acquired), right foot: Secondary | ICD-10-CM

## 2024-07-12 DIAGNOSIS — M19079 Primary osteoarthritis, unspecified ankle and foot: Secondary | ICD-10-CM

## 2024-07-12 NOTE — Progress Notes (Signed)
 Orthotics   Patient was present and evaluated for Custom molded foot orthotics. Patient will benefit from CFO's to provide total contact to BIL MLA's helping to balance and distribute body weight more evenly across BIL feet helping to reduce plantar pressure and pain. Orthotic will also encourage FF / RF alignment  Patient was scanned today and will return for fitting upon receipt  [facility] covered and no auth needed over $750   Lolita Schultze CPed, CFo, CFm

## 2024-07-13 DIAGNOSIS — Z79899 Other long term (current) drug therapy: Secondary | ICD-10-CM | POA: Diagnosis not present

## 2024-07-19 ENCOUNTER — Other Ambulatory Visit: Payer: Self-pay | Admitting: Physician Assistant

## 2024-07-19 ENCOUNTER — Other Ambulatory Visit: Payer: Self-pay | Admitting: Podiatry

## 2024-07-19 ENCOUNTER — Other Ambulatory Visit: Payer: Self-pay | Admitting: Infectious Diseases

## 2024-07-21 DIAGNOSIS — R2681 Unsteadiness on feet: Secondary | ICD-10-CM | POA: Diagnosis not present

## 2024-07-21 DIAGNOSIS — M79605 Pain in left leg: Secondary | ICD-10-CM | POA: Diagnosis not present

## 2024-07-21 DIAGNOSIS — M79604 Pain in right leg: Secondary | ICD-10-CM | POA: Diagnosis not present

## 2024-07-22 ENCOUNTER — Ambulatory Visit: Admitting: Podiatry

## 2024-07-27 DIAGNOSIS — M5134 Other intervertebral disc degeneration, thoracic region: Secondary | ICD-10-CM | POA: Diagnosis not present

## 2024-07-27 DIAGNOSIS — M79604 Pain in right leg: Secondary | ICD-10-CM | POA: Diagnosis not present

## 2024-07-27 DIAGNOSIS — F17210 Nicotine dependence, cigarettes, uncomplicated: Secondary | ICD-10-CM | POA: Diagnosis not present

## 2024-07-27 DIAGNOSIS — K5903 Drug induced constipation: Secondary | ICD-10-CM | POA: Diagnosis not present

## 2024-07-27 DIAGNOSIS — M79672 Pain in left foot: Secondary | ICD-10-CM | POA: Diagnosis not present

## 2024-07-27 DIAGNOSIS — Z79899 Other long term (current) drug therapy: Secondary | ICD-10-CM | POA: Diagnosis not present

## 2024-07-27 DIAGNOSIS — M79605 Pain in left leg: Secondary | ICD-10-CM | POA: Diagnosis not present

## 2024-07-27 DIAGNOSIS — M79671 Pain in right foot: Secondary | ICD-10-CM | POA: Diagnosis not present

## 2024-07-27 DIAGNOSIS — T402X5A Adverse effect of other opioids, initial encounter: Secondary | ICD-10-CM | POA: Diagnosis not present

## 2024-07-27 DIAGNOSIS — E7800 Pure hypercholesterolemia, unspecified: Secondary | ICD-10-CM | POA: Diagnosis not present

## 2024-07-27 DIAGNOSIS — E1142 Type 2 diabetes mellitus with diabetic polyneuropathy: Secondary | ICD-10-CM | POA: Diagnosis not present

## 2024-07-27 DIAGNOSIS — E559 Vitamin D deficiency, unspecified: Secondary | ICD-10-CM | POA: Diagnosis not present

## 2024-07-30 DIAGNOSIS — R0602 Shortness of breath: Secondary | ICD-10-CM | POA: Diagnosis not present

## 2024-07-30 DIAGNOSIS — Z79899 Other long term (current) drug therapy: Secondary | ICD-10-CM | POA: Diagnosis not present

## 2024-07-30 DIAGNOSIS — J4489 Other specified chronic obstructive pulmonary disease: Secondary | ICD-10-CM | POA: Diagnosis not present

## 2024-08-04 ENCOUNTER — Telehealth: Payer: Self-pay

## 2024-08-04 NOTE — Telephone Encounter (Signed)
 Orthotics are here Balance $0 Appt set for 10/27

## 2024-08-05 DIAGNOSIS — T402X5A Adverse effect of other opioids, initial encounter: Secondary | ICD-10-CM | POA: Diagnosis not present

## 2024-08-05 DIAGNOSIS — E1142 Type 2 diabetes mellitus with diabetic polyneuropathy: Secondary | ICD-10-CM | POA: Diagnosis not present

## 2024-08-05 DIAGNOSIS — M5134 Other intervertebral disc degeneration, thoracic region: Secondary | ICD-10-CM | POA: Diagnosis not present

## 2024-08-05 DIAGNOSIS — M79672 Pain in left foot: Secondary | ICD-10-CM | POA: Diagnosis not present

## 2024-08-05 DIAGNOSIS — E559 Vitamin D deficiency, unspecified: Secondary | ICD-10-CM | POA: Diagnosis not present

## 2024-08-05 DIAGNOSIS — R2681 Unsteadiness on feet: Secondary | ICD-10-CM | POA: Diagnosis not present

## 2024-08-05 DIAGNOSIS — E7800 Pure hypercholesterolemia, unspecified: Secondary | ICD-10-CM | POA: Diagnosis not present

## 2024-08-05 DIAGNOSIS — Z79899 Other long term (current) drug therapy: Secondary | ICD-10-CM | POA: Diagnosis not present

## 2024-08-05 DIAGNOSIS — F17210 Nicotine dependence, cigarettes, uncomplicated: Secondary | ICD-10-CM | POA: Diagnosis not present

## 2024-08-05 DIAGNOSIS — F1220 Cannabis dependence, uncomplicated: Secondary | ICD-10-CM | POA: Diagnosis not present

## 2024-08-05 DIAGNOSIS — M79671 Pain in right foot: Secondary | ICD-10-CM | POA: Diagnosis not present

## 2024-08-05 DIAGNOSIS — M961 Postlaminectomy syndrome, not elsewhere classified: Secondary | ICD-10-CM | POA: Diagnosis not present

## 2024-08-09 ENCOUNTER — Ambulatory Visit

## 2024-08-09 NOTE — Progress Notes (Signed)
 Patient presents today to pick up custom molded foot orthotics, diagnosed with Arthritis Metatarsal by Dr. Malvin.   Orthotics were dispensed and fit was satisfactory. Reviewed instructions for break-in and wear. Written instructions given to patient.  Patient will follow up as needed.   Lolita Schultze Cped, CFo, CFm

## 2024-08-10 ENCOUNTER — Ambulatory Visit (INDEPENDENT_AMBULATORY_CARE_PROVIDER_SITE_OTHER): Admitting: Podiatry

## 2024-08-10 ENCOUNTER — Encounter: Payer: Self-pay | Admitting: Podiatry

## 2024-08-10 DIAGNOSIS — M205X1 Other deformities of toe(s) (acquired), right foot: Secondary | ICD-10-CM

## 2024-08-10 DIAGNOSIS — M19079 Primary osteoarthritis, unspecified ankle and foot: Secondary | ICD-10-CM | POA: Diagnosis not present

## 2024-08-10 DIAGNOSIS — M7752 Other enthesopathy of left foot: Secondary | ICD-10-CM | POA: Diagnosis not present

## 2024-08-10 NOTE — Progress Notes (Unsigned)
 Subjective:  Patient ID: Danielle Harrison, female    DOB: Sep 23, 1976,  MRN: [id number]  Chief Complaint  Patient presents with   Foot Pain    Left foot arch pain. 8 pain. NIDDM A1C ? Right foot met 1 dorsal pain. Requesting injection on right foot.     DOS: 03/03/2024 Procedure: 1. Partial resection of 1st metatarsal and 1st proximal phalanx, Left foot 2. Removal and reinsertion of antibiotic cement spacer non biodegradable implant, left foot  48 y.o. female seen for post op check.   Patient reports she is doing well in regards to the left foot.  She does not have pain where the prior surgery was.  She does have some pain in the plantar arch underneath the great toe and more proximal.  She also has some pain in the right great toe joint.    Review of Systems: Negative except as noted in the HPI. Denies N/V/F/Ch.   Objective:   Constitutional Well developed. Well nourished.  Vascular Foot warm and well perfused. Capillary refill normal to all digits.   No calf pain with palpation  Neurologic Normal speech. Oriented to person, place, and time. Epicritic sensation diminished to left foot  Dermatologic Decreased edema about the left foot first MPJ some tenderness to palpation at the plantar aspect of the spacer though slightly improved overall clinically.  There is no open wound    Orthopedic: Status post repeat resection of left first metatarsal and first proximal phalanx with removal and replacement of antibiotic cement spacer with K wire fixation.  Decreasing edema of the first ray, mild tenderness palpation  Slightly tender to palpation along the flexor hallucis longus tendon on the left side.  This is plantar to the first metatarsal resection margin proximally.  On the right foot first MPJ there is noted the osseous spurring both dorsally and medially at the first metatarsal head consistent with osteoarthritis and pain with flexion and extension of the first MPJ as well as with  palpation.   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  Pathology: None taken 03/03/2024 IntraOp  Micro: 03/03/2024 wound swab culture from around the antibiotic cement spacer no growth   Assessment:   Painful and oversized antibiotics cement spacer status post removal and reinsertion of a smaller spacer with K wire fixation  Left foot flexor hallucis longus tendinitis at and proximal to the prior joint resection  Right foot with first MPJ hallux rigidus pain  Plan:  Patient was evaluated and treated and all questions answered.  #Flexor hallucis longus tendinitis left foot Patient does have evidence of possible tendinitis at the site of prior surgical intervention to include first MPJ resection. - I recommend management with steroid injection.  She is agreeable to this.  After sterile prep injected 1 cc half percent Marcaine  plain with 1 cc Kenalog Along the area of maximal tenderness to palpation along the FHL tendon at the plantar aspect of the first metatarsal resection margin proximally. - Recommend anti-inflammatory modalities such as icing rest and ibuprofen ifshe can take it  # Hallux valgus and hallux rigidus of the first MPJ -Recommend we proceed with  a steroid injection at this visit.  She is agreeable to this.  After sterile prep injected 1 cc half percent Marcaine  plain with 1 cc Kenalog 10 into and about the right first metatarsophalangeal joint. - Continue anti-inflammatory therapy and orthotics  -I did discuss with her that if the injection is not sufficient in reducing her pain  could consider surgical intervention on the right foot to include first MPJ arthrodesis.  Will discuss further at next appointment in 6 to 8 weeks and see how the injection did versus more aggressive management with surgery.. - F/u Plan: Follow-up in 6 weeks        Danielle Harrison, DPM [facility] / [facility]

## 2024-08-12 ENCOUNTER — Encounter: Payer: Self-pay | Admitting: Infectious Diseases

## 2024-08-12 ENCOUNTER — Ambulatory Visit: Attending: Infectious Diseases | Admitting: Infectious Diseases

## 2024-08-12 ENCOUNTER — Ambulatory Visit: Payer: Self-pay | Admitting: Infectious Diseases

## 2024-08-12 ENCOUNTER — Other Ambulatory Visit
Admission: RE | Admit: 2024-08-12 | Discharge: 2024-08-12 | Disposition: A | Discharge status: Home / Self Care | Attending: Infectious Diseases | Admitting: Infectious Diseases

## 2024-08-12 ENCOUNTER — Other Ambulatory Visit: Payer: Self-pay | Admitting: Infectious Diseases

## 2024-08-12 VITALS — BP 114/80 | HR 69 | Temp 97.9°F | Ht <= 58 in | Wt 112.0 lb

## 2024-08-12 DIAGNOSIS — M00072 Staphylococcal arthritis, left ankle and foot: Secondary | ICD-10-CM | POA: Diagnosis not present

## 2024-08-12 DIAGNOSIS — R7881 Bacteremia: Secondary | ICD-10-CM | POA: Diagnosis not present

## 2024-08-12 DIAGNOSIS — E119 Type 2 diabetes mellitus without complications: Secondary | ICD-10-CM | POA: Diagnosis not present

## 2024-08-12 DIAGNOSIS — Z7984 Long term (current) use of oral hypoglycemic drugs: Secondary | ICD-10-CM | POA: Insufficient documentation

## 2024-08-12 DIAGNOSIS — G9520 Unspecified cord compression: Secondary | ICD-10-CM | POA: Insufficient documentation

## 2024-08-12 DIAGNOSIS — B9562 Methicillin resistant Staphylococcus aureus infection as the cause of diseases classified elsewhere: Secondary | ICD-10-CM | POA: Diagnosis not present

## 2024-08-12 DIAGNOSIS — M4624 Osteomyelitis of vertebra, thoracic region: Secondary | ICD-10-CM | POA: Diagnosis not present

## 2024-08-12 DIAGNOSIS — E875 Hyperkalemia: Secondary | ICD-10-CM

## 2024-08-12 DIAGNOSIS — M4644 Discitis, unspecified, thoracic region: Secondary | ICD-10-CM | POA: Diagnosis not present

## 2024-08-12 DIAGNOSIS — Z8614 Personal history of Methicillin resistant Staphylococcus aureus infection: Secondary | ICD-10-CM | POA: Insufficient documentation

## 2024-08-12 DIAGNOSIS — M1991 Primary osteoarthritis, unspecified site: Secondary | ICD-10-CM | POA: Diagnosis not present

## 2024-08-12 DIAGNOSIS — M4620 Osteomyelitis of vertebra, site unspecified: Secondary | ICD-10-CM | POA: Diagnosis not present

## 2024-08-12 DIAGNOSIS — M519 Unspecified thoracic, thoracolumbar and lumbosacral intervertebral disc disorder: Secondary | ICD-10-CM | POA: Insufficient documentation

## 2024-08-12 DIAGNOSIS — G062 Extradural and subdural abscess, unspecified: Secondary | ICD-10-CM | POA: Diagnosis not present

## 2024-08-12 DIAGNOSIS — A4902 Methicillin resistant Staphylococcus aureus infection, unspecified site: Secondary | ICD-10-CM | POA: Diagnosis not present

## 2024-08-12 DIAGNOSIS — M009 Pyogenic arthritis, unspecified: Secondary | ICD-10-CM | POA: Insufficient documentation

## 2024-08-12 DIAGNOSIS — Z8719 Personal history of other diseases of the digestive system: Secondary | ICD-10-CM | POA: Diagnosis not present

## 2024-08-12 LAB — COMPREHENSIVE METABOLIC PANEL WITH GFR
ALT: 33 U/L (ref 0–44)
AST: 33 U/L (ref 15–41)
Albumin: 4.3 g/dL (ref 3.5–5.0)
Alkaline Phosphatase: 123 U/L (ref 38–126)
Anion gap: 10 (ref 5–15)
BUN: 22 mg/dL — ABNORMAL HIGH (ref 6–20)
CO2: 25 mmol/L (ref 22–32)
Calcium: 9.9 mg/dL (ref 8.9–10.3)
Chloride: 105 mmol/L (ref 98–111)
Creatinine, Ser: 0.66 mg/dL (ref 0.44–1.00)
GFR, Estimated: >60 mL/min (ref >60)
Glucose, Bld: 148 mg/dL — ABNORMAL HIGH (ref 70–99)
Potassium: 5.8 mmol/L — ABNORMAL HIGH (ref 3.5–5.1)
Sodium: 140 mmol/L (ref 135–145)
Total Bilirubin: 0.3 mg/dL (ref 0.0–1.2)
Total Protein: 7.5 g/dL (ref 6.5–8.1)

## 2024-08-12 LAB — SEDIMENTATION RATE: Sed Rate: 4 mm/h (ref 0–20)

## 2024-08-12 LAB — C-REACTIVE PROTEIN: CRP: 1 mg/dL — ABNORMAL HIGH (ref <1.0)

## 2024-08-12 MED ORDER — SULFAMETHOXAZOLE-TRIMETHOPRIM 800-160 MG PO TABS: 1.0000 | ORAL_TABLET | Freq: Two times a day (BID) | ORAL | 12 refills | Status: DC

## 2024-08-12 NOTE — Progress Notes (Signed)
 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 Was in the hospital between 10/18/2023 until 11/04/2023 for acute pancreatitis but also developed MRSA bacteremia 5 days into hospital stay ( BC positive on 1/9, 1/11 and 1/12) and T8-T9 thoracic vertebral osteomyelitis/discitis and left great toe septic arthritis for which she underwent Left foot first MTP joint resection including head of the first metatarsal and base of proximal phalanx.  This was done on 10/30/2023.  Culture was positive for MRSA.  TEE was negative on 10/29/23.  Repeat blood culture done on 10/30/2023 was negative for MRSA.  Patient was discharged on daptomycin  IV for minimum of 6 weeks.  The daptomycin  MIC was susceptible at 0.5 mcg.    She presented to urgent care on 11/16/2023 with muscle spasm in her chest.  No workup was done that day.  She was prescribed prednisone  and heating pad and wrapping of the chest wall.   She saw her PCP on 11/21/2023 and was still complaining of pain and she ordered a thoracic spine x-ray which showed progressive disc space narrowing and wedging of T6 and T7 worrisome for discitis and osteomyelitis and pathological compression fracture.   She came back to [facility] ED on 11/23/2023 complaining of worsening pain and it was hurting for her to talk turn twist or move.She had been 100% adherent to Daptomycin  . MRI showed epidural abscess and nerve compression after 23 days of IV dapto She underwent surgery on 11/25/23 1. Posterior Segmental Instrumentation T4 to T9 2. Posterolateral arthrodesis from T4 to T9 3. Thoracic lamina to me at T6 and T7 4.  Right sided transpedicular disc debridement and washout of ventral epidural abscess 5.  Autograft  harvest spinous processes 6.  Allograft placement Culture was MRSA- sensitive to Dapt MIC<1 She was this time treated with vancomycin  and discharged home on it along with rifampin  . There has been some fluctuation in vanco levels- intially low and vanco was increased to Q8. Cr has been steady She had been adherent to vanco Q8 but has not been sticking to the correct time, so the level of vanco has been high I saw her on 12/30/23 and She was doing much better Pain much improved- some present on the mid back The plan was to finish 8 weeks of Iv vanco and Po rifampin  on 4/7 and do bactrim  and rifampin  for 4-6 weeks. 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 IR aspirated the disc space and spinous process and culture was neg for MRSA PT was sent on Po Linezolid  and PO rifampin  on 4/9 On 4/15 rifampin  stopped due  nausea, vomiting diarrhea and heart burn- , and on 4/17  linezolid   DC 4/17  started on Bactrim  She has been on it since  then  MRI of thoracic and lumbar spine done on 03/02/24 Decreased diffuse edema in the T6 and T7 vertebral bodies. Residual edema and enhancement along the endplates at T6-7 and T7-8 concerning for residual discitis/osteomyelitis. Epidural abscess much improved.Similar discitis/osteomyelitis at L1-2. Residual edema and enhancement of the L2-3 spinous processes which is decreased from prior concerning for residual discitis/osteomyelitis.   On 03/03/24 she underwent Partial resection of 1st metatarsal and 1st proximal phalanx,  Left foot 2. Removal and reinsertion of antibiotic cement spacer non biodegradable implant, left foot  Yesterday saw Dr.Standiford and had steroid injection to both great toes- rt osteoarthritis Left toe had MRSA and she had surgery and it was removed     Past Medical History:  Diagnosis Date   Acute bacterial endocarditis    Anxiety    Arthritis    back   Asthma    DM (diabetes mellitus), type 2 (HCC)    HTN (hypertension)    MRSA bacteremia    Neuromuscular disorder (HCC)    weakness and numbness    Osteomyelitis of lumbar spine (HCC)    Osteomyelitis of toe of left foot (HCC)    Restless leg syndrome    SBO (small bowel obstruction) (HCC)     Past Surgical History:  Procedure Laterality Date   APPLICATION OF INTRAOPERATIVE CT SCAN N/A 11/25/2023   Procedure: APPLICATION OF INTRAOPERATIVE CT SCAN;  Surgeon: Claudene Penne ORN, MD;  Location: [facility];  Service: Neurosurgery;  Laterality: N/A;   BIOPSY  06/28/2019   Procedure: BIOPSY;  Surgeon: Janalyn Keene NOVAK, MD;  Location: [facility];  Service: Endoscopy;;   CESAREAN SECTION     x2   COLONOSCOPY WITH PROPOFOL  N/A 06/28/2019   Procedure: COLONOSCOPY WITH PROPOFOL ;  Surgeon: Janalyn Keene NOVAK, MD;  Location: [facility];  Service: Endoscopy;  Laterality: N/A;   ESOPHAGOGASTRODUODENOSCOPY (EGD) WITH PROPOFOL  N/A 06/28/2019   Procedure: ESOPHAGOGASTRODUODENOSCOPY (EGD) WITH PROPOFOL ;  Surgeon: Janalyn Keene NOVAK, MD;  Location: [facility];  Service: Endoscopy;  Laterality: N/A;   INCISION AND DRAINAGE Left 10/27/2023   Procedure: INCISION AND DRAINAGE LEFT FOOT;  Surgeon: Malvin Marsa FALCON, DPM;  Location: [facility];  Service: Orthopedics/Podiatry;  Laterality: Left;   INCISION AND DRAINAGE Left 10/30/2023   Procedure: LEFT FOOT FIRST METATARSAL PHALANGEAL JOINT RESECTION, ANTIBIOTIC SPACER, WASHOUT, WOUND CLOSURE;  Surgeon: Malvin Marsa FALCON, DPM;  Location: [facility];  Service:  Orthopedics/Podiatry;  Laterality: Left;   IR LUMBAR DISC ASPIRATION W/IMG GUIDE  01/19/2024   METATARSAL HEAD EXCISION Left 03/03/2024   Procedure: EXCISION, METATARSAL BONE, HEAD;  Surgeon: Malvin Marsa FALCON, DPM;  Location: [facility];  Service: Orthopedics/Podiatry;  Laterality: Left;  LEFT FOOT REMOVAL AND REINSTERTION OF CEMENT SPACER   TEE WITHOUT CARDIOVERSION N/A 10/29/2023   Procedure: TRANSESOPHAGEAL ECHOCARDIOGRAM (TEE);  Surgeon: Perla Evalene PARAS, MD;  Location: [facility];  Service: Cardiovascular;  Laterality: N/A;   THORACIC LAMINECTOMY FOR EPIDURAL ABSCESS N/A 11/25/2023   Procedure: T6-T7 thoracic laminectomy, right sided transpedicular decompression and disc debridement;  Surgeon: Claudene Penne ORN, MD;  Location: [facility];  Service: Neurosurgery;  Laterality: N/A;   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   Procedure: LEFT FOOT 1ST MPJ RESECTION;  Surgeon: Malvin Marsa FALCON, DPM;  Location: [facility];  Service: Orthopedics/Podiatry;  Laterality: Left;   TRANSMETATARSAL AMPUTATION Left 10/30/2023   Procedure: LEFT FOOT 1ST MPJ RESECTION;  Surgeon: Malvin Marsa FALCON, DPM;  Location: [facility];  Service: Orthopedics/Podiatry;  Laterality: Left;    Social History  Socioeconomic History   Marital status: Married    Spouse name: Not on file   Number of children: Not on file   Years of education: Not on file   Highest education level: Not on file  Occupational History   Not on file  Tobacco Use   Smoking status: Former    Current packs/day: 0.50    Average packs/day: 0.5 packs/day for 6.0 years (3.0 ttl pk-yrs)    Types: Cigarettes   Smokeless tobacco: Never   Tobacco comments:    Quit 8/22  Vaping Use   Vaping status: Every Day   Substances: Nicotine, Flavoring  Substance and Sexual Activity   Alcohol use: Yes    Comment: rare   Drug use: Never   Sexual activity: Not on file  Other Topics Concern   Not on file  Social History Narrative   Not on file    Social Drivers of Health   Financial Resource Strain: Not on file  Food Insecurity: No Food Insecurity (01/15/2024)   Hunger Vital Sign    Worried About Running Out of Food in the Last Year: Never true    Ran Out of Food in the Last Year: Never true  Transportation Needs: No Transportation Needs (01/15/2024)   PRAPARE - Administrator, Civil Service (Medical): No    Lack of Transportation (Non-Medical): No  Physical Activity: Not on file  Stress: Not on file  Social Connections: Not on file  Intimate Partner Violence: Not At Risk (01/15/2024)   Humiliation, Afraid, Rape, and Kick questionnaire    Fear of Current or Ex-Partner: No    Emotionally Abused: No    Physically Abused: No    Sexually Abused: No    Family History  Problem Relation Age of Onset   Arthritis Mother    Heart disease Father    Colon cancer Neg Hx    Allergies  Allergen Reactions   Diclofenac Hives   I? Current Outpatient Medications  Medication Sig Dispense Refill   albuterol  (PROVENTIL ) (2.5 MG/3ML) 0.083% nebulizer solution Take 2.5 mg by nebulization every 6 (six) hours as needed for wheezing or shortness of breath.     ciclopirox  (PENLAC ) 8 % solution APPLY TOPICALLY AT BEDTIME OVER NAIL AND SURROUNDING SKIN. APPLY DAILY OVER PREVIOUS COAT. AFTER 7 DAYS, MAY REMOVE WITH ALCOHOL AND CONTINUE CYCLE. 6.6 mL 0   gabapentin  (NEURONTIN ) 300 MG capsule Take 2 capsules (600 mg total) by mouth 3 (three) times daily. 90 capsule 2   meloxicam  (MOBIC ) 15 MG tablet TAKE 1 TABLET (15 MG TOTAL) BY MOUTH DAILY. 30 tablet 0   metFORMIN  (GLUCOPHAGE ) 500 MG tablet TAKE 1 TABLET BY MOUTH 2 TIMES DAILY WITH A MEAL. 180 tablet 1   methocarbamol  (ROBAXIN ) 500 MG tablet TAKE 1 TABLET BY MOUTH EVERY 6 HOURS AS NEEDED FOR MUSCLE SPASMS. 120 tablet 0   metoprolol  tartrate (LOPRESSOR ) 100 MG tablet TAKE 1 TABLET BY MOUTH TWICE A DAY 180 tablet 1   oxyCODONE -acetaminophen  (PERCOCET) 5-325 MG tablet Take 1 tablet by mouth  every 4 (four) hours as needed for severe pain (pain score 7-10). 15 tablet 0   sulfamethoxazole -trimethoprim  (BACTRIM  DS) 800-160 MG tablet Take 1 tablet by mouth 2 (two) times daily. 60 tablet 3   SYMBICORT  160-4.5 MCG/ACT inhaler INHALE 2 PUFFS INTO THE LUNGS TWICE A DAY 10.2 each 3   tiZANidine  (ZANAFLEX ) 4 MG tablet TAKE 0.5-1 TABLETS (2-4 MG TOTAL) BY MOUTH 3 (THREE) TIMES DAILY. 270 tablet 1  VENTOLIN  HFA 108 (90 Base) MCG/ACT inhaler INHALE 1 PUFF INTO THE LUNGS EVERY 6 HOURS AS NEEDED FOR WHEEZING OR SHORTNESS OF BREATH. 18 each 3   No current facility-administered medications for this visit.     Abtx:  Anti-infectives (From admission, onward)    None       REVIEW OF SYSTEMS:  Const: negative fever, negative chills, negative weight loss Eyes: negative diplopia or visual changes, negative eye pain ENT: negative coryza, negative sore throat Resp: negative cough, hemoptysis, dyspnea Cards: negative for chest pain, palpitations, lower extremity edema GU: negative for frequency, dysuria and hematuria GI: Negative for abdominal pain, diarrhea, bleeding, constipation Skin: negative for rash and pruritus Heme: negative for easy bruising and gum/nose bleeding MS: pain both toes Neurolo:negative for headaches, dizziness, vertigo, memory problems  Psych: negative for feelings of anxiety, depression  Endocrine:  diabetes Allergy/Immunology- diclofenac Objective:  VITALS:  There were no vitals taken for this visit.  LDA PICC PHYSICAL EXAM:  General: Alert, cooperative, no distress, appears stated age.  Head: Normocephalic, without obvious abnormality, atraumatic. Eyes: Conjunctivae clear, anicteric sclerae. Pupils are equal ENT Nares normal. No drainage or sinus tenderness. Lips, mucosa, and tongue normal. No Thrush Neck: Supple, symmetrical, no adenopathy, thyroid: non tender no carotid bruit and no JVD. Back: thoracic scar healed well Lungs: Clear to auscultation  bilaterally. No Wheezing or Rhonchi. No rales. Heart: Regular rate and rhythm, no murmur, rub or gallop. Abdomen: Soft, non-tender,not distended. Bowel sounds normal. No masses Extremities      Skin: No rashes or lesions. Or bruising Lymph: Cervical, supraclavicular normal. Neurologic: Grossly non-focal Pertinent Labs   05/10/24 XRAY spine rods and screws T4-T9  ? Impression/Recommendation Follow up?MRSA bacteremia, Extensive spinal infection, left great toe septic arthritis- s/p thoracic spine fusion ?MRSA bacteremia in Jan 2025 with T6-T7 discitis  and left great toe septic arthritis and osteo She was  on appropriate antibiotic  and after  23 days of Iv daptomycin  there was progression of the vertebral osteomyelitis  with epidural abscess and nerve compression causing severe pain. She underwent surgery on 11/25/23 1. Posterior Segmental Instrumentation T4 to T9 2. Posterolateral arthrodesis from T4 to T9 3. Thoracic lamina to me at T6 and T7 4.  Right sided transpedicular disc debridement and washout of ventral epidural abscess 5.  Autograft harvest spinous processes 6.  Allograft placement  culture 11/25/23 was MRSA ( susceptible to vanco and dapto)  She was given IV  vancomycin  and PO rifampin   Sed rate /crp normalized While on the 8th week of Antibiotic she was readmitted In April 2025 for lumbar pain  and MRI showed L1-L2 discitis and osteo and L3 spinous process involvement She underwent aspiration from the disc space and spinous process by IR  on 01/19/24 and the culture was no growth She was sent home on 4/9 on linezolid  and rifampin  Both the antibotics stopped due to side effects on 4/15 and 4/17   'been on bactrim  since then  and doing okay Pain resolved- back to cleaning job- taking oxycodone  managed by pain clinic  ESR and CRP have been N for many months Plan is to continue anti MRSA antibiotic for a long time VS indefinitely While on bactrim  monitor K and cr     She has baseline lumbar deg disc disease and is followed by pain management at [place]     Left great toe infection with septic arthritis and osteo of the Met- s/p resection- site has healed well Bony enlargement  was painful and podiatrist did further resection and decreased the size of the spacer on 03/03/24 and culture was negative. Still the spacer is large and she has pain  She got steroid injection yesterday for pain Would avoid steroids as it does not give lasting pain relief- she already has pain today   H/o pancreatitis  DM on metformin    Will do labs today   Follow up 6 months

## 2024-08-12 NOTE — Patient Instructions (Signed)
 Please continue bactrim  for the MRSA spine infection- will do labs today. Follow up 6 months

## 2024-08-16 ENCOUNTER — Other Ambulatory Visit
Admission: RE | Admit: 2024-08-16 | Discharge: 2024-08-16 | Disposition: A | Discharge status: Home / Self Care | Source: Ambulatory Visit | Attending: Infectious Diseases | Admitting: Infectious Diseases

## 2024-08-16 DIAGNOSIS — E875 Hyperkalemia: Secondary | ICD-10-CM | POA: Insufficient documentation

## 2024-08-16 LAB — BASIC METABOLIC PANEL WITH GFR
Anion gap: 11 (ref 5–15)
BUN: 20 mg/dL (ref 6–20)
CO2: 20 mmol/L — ABNORMAL LOW (ref 22–32)
Calcium: 9.5 mg/dL (ref 8.9–10.3)
Chloride: 104 mmol/L (ref 98–111)
Creatinine, Ser: 1.25 mg/dL — ABNORMAL HIGH (ref 0.44–1.00)
GFR, Estimated: 53 mL/min — ABNORMAL LOW (ref >60)
Glucose, Bld: 130 mg/dL — ABNORMAL HIGH (ref 70–99)
Potassium: 5.3 mmol/L — ABNORMAL HIGH (ref 3.5–5.1)
Sodium: 135 mmol/L (ref 135–145)

## 2024-08-17 ENCOUNTER — Ambulatory Visit: Payer: Self-pay | Admitting: Infectious Diseases

## 2024-08-17 ENCOUNTER — Other Ambulatory Visit: Payer: Self-pay | Admitting: Infectious Diseases

## 2024-08-17 DIAGNOSIS — E875 Hyperkalemia: Secondary | ICD-10-CM

## 2024-08-17 MED ORDER — DOXYCYCLINE HYCLATE 100 MG PO TABS: 100.0000 mg | ORAL_TABLET | Freq: Two times a day (BID) | ORAL | 3 refills | Status: AC

## 2024-08-19 ENCOUNTER — Encounter: Payer: Self-pay | Admitting: Radiology

## 2024-08-19 ENCOUNTER — Ambulatory Visit
Admission: RE | Admit: 2024-08-19 | Discharge: 2024-08-19 | Disposition: A | Discharge status: Home / Self Care | Source: Ambulatory Visit | Attending: Cardiology | Admitting: Cardiology

## 2024-08-19 DIAGNOSIS — Z1231 Encounter for screening mammogram for malignant neoplasm of breast: Secondary | ICD-10-CM | POA: Diagnosis not present

## 2024-08-21 ENCOUNTER — Other Ambulatory Visit: Payer: Self-pay | Admitting: Cardiology

## 2024-08-23 ENCOUNTER — Ambulatory Visit (INDEPENDENT_AMBULATORY_CARE_PROVIDER_SITE_OTHER)

## 2024-08-23 ENCOUNTER — Ambulatory Visit: Admitting: Physician Assistant

## 2024-08-23 ENCOUNTER — Encounter: Payer: Self-pay | Admitting: Physician Assistant

## 2024-08-23 VITALS — BP 120/82 | Wt 110.8 lb

## 2024-08-23 DIAGNOSIS — R202 Paresthesia of skin: Secondary | ICD-10-CM | POA: Diagnosis not present

## 2024-08-23 DIAGNOSIS — I7300 Raynaud's syndrome without gangrene: Secondary | ICD-10-CM

## 2024-08-23 DIAGNOSIS — R200 Anesthesia of skin: Secondary | ICD-10-CM | POA: Diagnosis not present

## 2024-08-23 DIAGNOSIS — M5030 Other cervical disc degeneration, unspecified cervical region: Secondary | ICD-10-CM | POA: Diagnosis not present

## 2024-08-23 NOTE — Progress Notes (Signed)
 REFERRING PHYSICIAN:  Carin Gauze, [address] [place],  [place] [address]  DOS: 11/25/23 T6-7 laminectomy, right transpedicular decompression and disc debridement, T4-T10 posterior spinal fusion.    HISTORY OF PRESENT ILLNESS: Danielle Harrison is approximately 9 months  status post thoracic decompression and fusion as well as debridement for osteomyelitis.    She comes in today for numbness and tingling tips of her fingers and toes involving both hands and both feet.  This has become progressively worse over the past 2 months.  She states often her digits will turn white and feel very cold.  It is also worse with cold temperatures.  She does add though that sometimes she has numbness and tingling without any change in her skin color.  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  She feels though she is start dropping things and her hands have gotten weaker.  She states that she has minimal neck pain it does not radiate down her arms however.    PHYSICAL EXAMINATION:  General: Patient is well developed, well nourished, calm, collected, and in no apparent distress.   NEUROLOGICAL:  General: In no acute distress.   Awake, alert, oriented to person, place, and time.  Pupils equal round and reactive to light.  Facial tone is symmetric.   Strength:            Good strength to proximal bilateral upper extremities.  She does have some grip weakness and intrinsic weakness that is minimal at a 4+.  ABP at a 4 on the right hand, 4+ in the left.  Positive reverse Froment's.   ROS (Neurologic):  Negative except as noted above  IMAGING: 03/02/2024 thoracic MRI with and without contrast  IMPRESSION: Interval posterior fusion of the thoracic spine as above.   Decreased diffuse edema in the T6 and T7 vertebral bodies. Residual edema and enhancement along the endplates at T6-7 and T7-8 concerning for  residual discitis/osteomyelitis.   Similar discitis/osteomyelitis at L1-2. Residual edema and enhancement of the L2-3 spinous processes which is decreased from prior concerning for residual discitis/osteomyelitis.   Significant decreased epidural phlegmon/abscess in the thoracic spine compared to study on 11/23/2023. On the current study there is mild residual enhancement along the posterior aspect of T6 and T7 which may reflect small residual epidural phlegmon. Improved spinal canal patency at these levels.   Redemonstrated compression deformities of the T6 and T7 vertebra without significant interval height loss.   Degenerative changes are similar to prior.    ASSESSMENT/PLAN:  Danielle Harrison is approximately 9 months  status post thoracic decompression and fusion as well as debridement for osteomyelitis.  She comes in today for bilateral hand and feet numbness and tingling specifically in the tips of her fingers and toes.  This is sometimes correlated with a change in color and when she feels as though they are very pale and feel very numb and cold.  Other times her numbness and tingling increases at night and she feels as though she has to shake it out in order to get her feeling back.  She has minimal neck pain with this.  Differential includes Raynaud's as well as carpal tunnel syndrome.  Due to previous discitis osteomyelitis in the lower part of her spine, we also want a make sure that x-rays were obtained of her neck today.  Plan includes the following:  -Cervical  4 view x-rays to be completed today -Referral to neurology for EMG for evaluation of carpal tunnel syndrome. -Will reach out to patient's primary care provider in regards to possible Raynaud's.  She also has not been seen in a while and would like to make sure that her A1c is checked to ensure that she has good glucose control which could be contributing to numbness and tingling as well.    Lyle Decamp  PA-C Department of neurosurgery

## 2024-08-24 ENCOUNTER — Other Ambulatory Visit: Payer: Self-pay

## 2024-08-24 ENCOUNTER — Encounter: Payer: Self-pay | Admitting: Neurology

## 2024-08-24 ENCOUNTER — Other Ambulatory Visit: Payer: Self-pay | Admitting: Cardiology

## 2024-08-24 DIAGNOSIS — R928 Other abnormal and inconclusive findings on diagnostic imaging of breast: Secondary | ICD-10-CM

## 2024-08-24 DIAGNOSIS — R202 Paresthesia of skin: Secondary | ICD-10-CM

## 2024-08-25 ENCOUNTER — Other Ambulatory Visit: Payer: Self-pay | Admitting: Physician Assistant

## 2024-08-26 DIAGNOSIS — F17210 Nicotine dependence, cigarettes, uncomplicated: Secondary | ICD-10-CM | POA: Diagnosis not present

## 2024-08-26 DIAGNOSIS — F1220 Cannabis dependence, uncomplicated: Secondary | ICD-10-CM | POA: Diagnosis not present

## 2024-08-26 DIAGNOSIS — M79672 Pain in left foot: Secondary | ICD-10-CM | POA: Diagnosis not present

## 2024-08-26 DIAGNOSIS — M961 Postlaminectomy syndrome, not elsewhere classified: Secondary | ICD-10-CM | POA: Diagnosis not present

## 2024-08-26 DIAGNOSIS — R2681 Unsteadiness on feet: Secondary | ICD-10-CM | POA: Diagnosis not present

## 2024-08-26 DIAGNOSIS — M79671 Pain in right foot: Secondary | ICD-10-CM | POA: Diagnosis not present

## 2024-08-26 DIAGNOSIS — M5134 Other intervertebral disc degeneration, thoracic region: Secondary | ICD-10-CM | POA: Diagnosis not present

## 2024-08-26 DIAGNOSIS — Z79899 Other long term (current) drug therapy: Secondary | ICD-10-CM | POA: Diagnosis not present

## 2024-08-30 DIAGNOSIS — Z79899 Other long term (current) drug therapy: Secondary | ICD-10-CM | POA: Diagnosis not present

## 2024-08-31 ENCOUNTER — Other Ambulatory Visit: Payer: Self-pay | Admitting: Podiatry

## 2024-08-31 ENCOUNTER — Encounter: Payer: Self-pay | Admitting: Cardiology

## 2024-08-31 ENCOUNTER — Ambulatory Visit: Admitting: Cardiology

## 2024-08-31 VITALS — BP 114/76 | HR 70 | Ht <= 58 in | Wt 111.4 lb

## 2024-08-31 DIAGNOSIS — E1159 Type 2 diabetes mellitus with other circulatory complications: Secondary | ICD-10-CM

## 2024-08-31 DIAGNOSIS — I7300 Raynaud's syndrome without gangrene: Secondary | ICD-10-CM | POA: Insufficient documentation

## 2024-08-31 DIAGNOSIS — I152 Hypertension secondary to endocrine disorders: Secondary | ICD-10-CM

## 2024-08-31 DIAGNOSIS — E782 Mixed hyperlipidemia: Secondary | ICD-10-CM | POA: Diagnosis not present

## 2024-08-31 DIAGNOSIS — E1169 Type 2 diabetes mellitus with other specified complication: Secondary | ICD-10-CM | POA: Diagnosis not present

## 2024-08-31 DIAGNOSIS — D229 Melanocytic nevi, unspecified: Secondary | ICD-10-CM | POA: Diagnosis not present

## 2024-08-31 DIAGNOSIS — E1165 Type 2 diabetes mellitus with hyperglycemia: Secondary | ICD-10-CM | POA: Diagnosis not present

## 2024-08-31 DIAGNOSIS — Z1329 Encounter for screening for other suspected endocrine disorder: Secondary | ICD-10-CM | POA: Diagnosis not present

## 2024-08-31 DIAGNOSIS — R202 Paresthesia of skin: Secondary | ICD-10-CM | POA: Diagnosis not present

## 2024-08-31 MED ORDER — LANCETS MISC: 1.0000 | 0 refills | Status: DC

## 2024-08-31 MED ORDER — AMLODIPINE BESYLATE 2.5 MG PO TABS: 2.5000 mg | ORAL_TABLET | Freq: Every day | ORAL | 11 refills | Status: DC

## 2024-08-31 MED ORDER — LANCET DEVICE MISC: 1.0000 | 0 refills | Status: AC

## 2024-08-31 MED ORDER — BLOOD GLUCOSE TEST VI STRP: 1.0000 | ORAL_STRIP | 0 refills | Status: AC

## 2024-08-31 MED ORDER — BLOOD GLUCOSE MONITORING SUPPL DEVI: 1.0000 | 0 refills | Status: DC

## 2024-08-31 NOTE — Progress Notes (Signed)
 Established Patient Office Visit  Subjective:  Patient ID: Danielle Harrison, female    DOB: [birth year]  Age: 48 y.o. MRN: [id number]  Chief Complaint  Patient presents with   Acute Visit    Possible raynauds phenomenon    Patient in office for over due follow up. Patient doing well, no new complaints today. Patient did not have blood work done. Will return when fasting.  Patient requesting a referral to dermatology for skin check, referral sent.  Patient seen by neurosurgery for back pain. Patient has complained of numbness and tingling tips of her fingers and toes involving both hands and both feet.  This has become progressively worse over the past 2 months.  She states often her digits will turn white and feel very cold.  It is also worse with cold temperatures.  She does add though that sometimes she has numbness and tingling without any change in her skin color.  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  She feels though she is start dropping things and her hands have gotten weaker.  She states that she has minimal neck pain it does not radiate down her arms however. Will add low dose amlodipine , 2.5 mg daily. Patient blood pressure normal. Will also check a vitamin b12 with lab work.     No other concerns at this time.   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   HTN (hypertension)    MRSA bacteremia    Neuromuscular disorder (HCC)    weakness and numbness    Osteomyelitis of lumbar spine (HCC)    Osteomyelitis of toe of left foot (HCC)    Restless leg syndrome    SBO (small bowel obstruction) (HCC)    Small bowel intussusception (HCC) 10/19/2023   Stomach irritation     Past Surgical History:  Procedure  Laterality Date   APPLICATION OF INTRAOPERATIVE CT SCAN N/A 11/25/2023   Procedure: APPLICATION OF INTRAOPERATIVE CT SCAN;  Surgeon: Claudene Penne ORN, MD;  Location: [facility];  Service: Neurosurgery;  Laterality: N/A;   BIOPSY  06/28/2019   Procedure: BIOPSY;  Surgeon: Janalyn Keene NOVAK, MD;  Location: [facility];  Service: Endoscopy;;   CESAREAN SECTION     x2   COLONOSCOPY WITH PROPOFOL  N/A 06/28/2019   Procedure: COLONOSCOPY WITH PROPOFOL ;  Surgeon: Janalyn Keene NOVAK, MD;  Location: [facility];  Service: Endoscopy;  Laterality: N/A;   ESOPHAGOGASTRODUODENOSCOPY (EGD) WITH PROPOFOL  N/A 06/28/2019   Procedure: ESOPHAGOGASTRODUODENOSCOPY (EGD) WITH PROPOFOL ;  Surgeon: Janalyn Keene NOVAK, MD;  Location: [facility];  Service: Endoscopy;  Laterality: N/A;   INCISION AND DRAINAGE Left 10/27/2023   Procedure: INCISION AND DRAINAGE LEFT FOOT;  Surgeon: Malvin Marsa FALCON, DPM;  Location: [facility];  Service: Orthopedics/Podiatry;  Laterality: Left;   INCISION AND DRAINAGE Left 10/30/2023   Procedure: LEFT FOOT FIRST METATARSAL PHALANGEAL JOINT RESECTION, ANTIBIOTIC SPACER, WASHOUT, WOUND CLOSURE;  Surgeon: Malvin Marsa FALCON, DPM;  Location: [facility];  Service: Orthopedics/Podiatry;  Laterality: Left;   IR LUMBAR DISC ASPIRATION W/IMG GUIDE  01/19/2024   METATARSAL HEAD EXCISION Left 03/03/2024   Procedure: EXCISION, METATARSAL BONE, HEAD;  Surgeon:  Standiford, Marsa FALCON, DPM;  Location: [facility];  Service: Orthopedics/Podiatry;  Laterality: Left;  LEFT FOOT REMOVAL AND REINSTERTION OF CEMENT SPACER   TEE WITHOUT CARDIOVERSION N/A 10/29/2023   Procedure: TRANSESOPHAGEAL ECHOCARDIOGRAM (TEE);  Surgeon: Perla Evalene PARAS, MD;  Location: [facility];  Service: Cardiovascular;  Laterality: N/A;   THORACIC LAMINECTOMY FOR EPIDURAL ABSCESS N/A 11/25/2023   Procedure: T6-T7 thoracic laminectomy, right sided transpedicular decompression and disc debridement;  Surgeon:  Claudene Penne ORN, MD;  Location: [facility];  Service: Neurosurgery;  Laterality: N/A;   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   Procedure: LEFT FOOT 1ST MPJ RESECTION;  Surgeon: Malvin Marsa FALCON, DPM;  Location: [facility];  Service: Orthopedics/Podiatry;  Laterality: Left;   TRANSMETATARSAL AMPUTATION Left 10/30/2023   Procedure: LEFT FOOT 1ST MPJ RESECTION;  Surgeon: Malvin Marsa FALCON, DPM;  Location: [facility];  Service: Orthopedics/Podiatry;  Laterality: Left;    Social History   Socioeconomic History   Marital status: Married    Spouse name: Not on file   Number of children: Not on file   Years of education: Not on file   Highest education level: Not on file  Occupational History   Not on file  Tobacco Use   Smoking status: Former    Current packs/day: 0.50    Average packs/day: 0.5 packs/day for 6.0 years (3.0 ttl pk-yrs)    Types: Cigarettes   Smokeless tobacco: Never   Tobacco comments:    Quit 8/22  Vaping Use   Vaping status: Every Day   Substances: Nicotine, Flavoring  Substance and Sexual Activity   Alcohol use: Yes    Comment: rare   Drug use: Never   Sexual activity: Not on file  Other Topics Concern   Not on file  Social History Narrative   Not on file   Social Drivers of Health   Financial Resource Strain: Not on file  Food Insecurity: No Food Insecurity (01/15/2024)   Hunger Vital Sign    Worried About Running Out of Food in the Last Year: Never true    Ran Out of Food in the Last Year: Never true  Transportation Needs: No Transportation Needs (01/15/2024)   PRAPARE - Administrator, Civil Service (Medical): No    Lack of Transportation (Non-Medical): No  Physical Activity: Not on file  Stress: Not on file  Social Connections: Not on file  Intimate Partner Violence: Not At Risk (01/15/2024)   Humiliation, Afraid, Rape, and Kick questionnaire    Fear of Current or Ex-Partner: No    Emotionally Abused: No    Physically Abused:  No    Sexually Abused: No    Family History  Problem Relation Age of Onset   Arthritis Mother    Heart disease Father    Breast cancer Other        mggm   Colon cancer Neg Hx     Allergies  Allergen Reactions   Diclofenac Hives    Outpatient Medications Prior to Visit  Medication Sig   albuterol  (PROVENTIL ) (2.5 MG/3ML) 0.083% nebulizer solution Take 2.5 mg by nebulization every 6 (six) hours as needed for wheezing or shortness of breath.   budesonide -formoterol  (SYMBICORT ) 160-4.5 MCG/ACT inhaler INHALE 2 PUFFS INTO THE LUNGS TWICE A DAY   ciclopirox  (PENLAC ) 8 % solution APPLY TOPICALLY AT BEDTIME OVER NAIL AND SURROUNDING SKIN. APPLY DAILY OVER PREVIOUS COAT. AFTER 7 DAYS, MAY REMOVE WITH ALCOHOL AND CONTINUE CYCLE.   doxycycline (VIBRA-TABS) 100 MG tablet Take 1 tablet (  100 mg total) by mouth 2 (two) times daily.   gabapentin  (NEURONTIN ) 300 MG capsule Take 2 capsules (600 mg total) by mouth 3 (three) times daily.   meloxicam  (MOBIC ) 15 MG tablet TAKE 1 TABLET (15 MG TOTAL) BY MOUTH DAILY.   metFORMIN  (GLUCOPHAGE ) 500 MG tablet TAKE 1 TABLET BY MOUTH 2 TIMES DAILY WITH A MEAL.   methocarbamol  (ROBAXIN ) 500 MG tablet TAKE 1 TABLET BY MOUTH EVERY 6 HOURS AS NEEDED FOR MUSCLE SPASMS.   metoprolol  tartrate (LOPRESSOR ) 100 MG tablet TAKE 1 TABLET BY MOUTH TWICE A DAY   naloxone (NARCAN) nasal spray 4 mg/0.1 mL SMARTSIG:Both Nares   oxyCODONE -acetaminophen  (PERCOCET) 5-325 MG tablet Take 1 tablet by mouth every 4 (four) hours as needed for severe pain (pain score 7-10).   rosuvastatin (CRESTOR) 5 MG tablet Take 5 mg by mouth at bedtime.   tiZANidine  (ZANAFLEX ) 4 MG tablet TAKE 0.5-1 TABLETS (2-4 MG TOTAL) BY MOUTH 3 (THREE) TIMES DAILY.   VENTOLIN  HFA 108 (90 Base) MCG/ACT inhaler INHALE 1 PUFF INTO THE LUNGS EVERY 6 HOURS AS NEEDED FOR WHEEZING OR SHORTNESS OF BREATH.   No facility-administered medications prior to visit.    Review of Systems  Constitutional: Negative.   HENT:  Negative.    Eyes: Negative.   Respiratory: Negative.  Negative for shortness of breath.   Cardiovascular: Negative.  Negative for chest pain.  Gastrointestinal: Negative.  Negative for abdominal pain, constipation and diarrhea.  Genitourinary: Negative.   Musculoskeletal:  Negative for joint pain and myalgias.  Skin: Negative.   Neurological: Negative.  Negative for dizziness and headaches.  Endo/Heme/Allergies: Negative.   All other systems reviewed and are negative.      Objective:   BP 114/76   Pulse 70   Ht 4' 9 (1.448 m)   Wt 111 lb 6.4 oz (50.5 kg)   LMP 03/08/2024   SpO2 99%   BMI 24.11 kg/m   Vitals:   08/31/24 0859  BP: 114/76  Pulse: 70  Height: 4' 9 (1.448 m)  Weight: 111 lb 6.4 oz (50.5 kg)  SpO2: 99%  BMI (Calculated): 24.1    Physical Exam Vitals and nursing note reviewed.  Constitutional:      Appearance: Normal appearance. She is normal weight.  HENT:     Head: Normocephalic and atraumatic.     Nose: Nose normal.     Mouth/Throat:     Mouth: Mucous membranes are moist.  Eyes:     Extraocular Movements: Extraocular movements intact.     Conjunctiva/sclera: Conjunctivae normal.     Pupils: Pupils are equal, round, and reactive to light.  Cardiovascular:     Rate and Rhythm: Normal rate and regular rhythm.     Pulses: Normal pulses.     Heart sounds: Normal heart sounds.  Pulmonary:     Effort: Pulmonary effort is normal.     Breath sounds: Normal breath sounds.  Abdominal:     General: Abdomen is flat. Bowel sounds are normal.     Palpations: Abdomen is soft.  Musculoskeletal:        General: Normal range of motion.     Cervical back: Normal range of motion.  Skin:    General: Skin is warm and dry.  Neurological:     General: No focal deficit present.     Mental Status: She is alert and oriented to person, place, and time.  Psychiatric:        Mood and Affect: Mood normal.  Behavior: Behavior normal.        Thought Content:  Thought content normal.        Judgment: Judgment normal.      No results found for any visits on 08/31/24.  Recent Results (from the past 2160 hours)  C-reactive protein     Status: Abnormal   Collection Time: 08/12/24  9:25 AM  Result Value Ref Range   CRP 1.0 (H) <1.0 mg/dL    Comment: Performed at [facility], [address] [place], [place] [address]  Sedimentation rate     Status: None   Collection Time: 08/12/24  9:25 AM  Result Value Ref Range   Sed Rate 4 0 - 20 mm/hr    Comment: Performed at [facility], [address] [place], [place] [address]  Comprehensive metabolic panel with GFR     Status: Abnormal   Collection Time: 08/12/24  9:25 AM  Result Value Ref Range   Sodium 140 135 - 145 mmol/L   Potassium 5.8 (H) 3.5 - 5.1 mmol/L   Chloride 105 98 - 111 mmol/L   CO2 25 22 - 32 mmol/L   Glucose, Bld 148 (H) 70 - 99 mg/dL    Comment: Glucose reference range applies only to samples taken after fasting for at least 8 hours.   BUN 22 (H) 6 - 20 mg/dL   Creatinine, Ser 9.33 0.44 - 1.00 mg/dL   Calcium 9.9 8.9 - 89.6 mg/dL   Total Protein 7.5 6.5 - 8.1 g/dL   Albumin 4.3 3.5 - 5.0 g/dL   AST 33 15 - 41 U/L   ALT 33 0 - 44 U/L   Alkaline Phosphatase 123 38 - 126 U/L   Total Bilirubin 0.3 0.0 - 1.2 mg/dL   GFR, Estimated >39 >39 mL/min    Comment: (NOTE) Calculated using the CKD-EPI Creatinine Equation (2021)    Anion gap 10 5 - 15    Comment: Performed at [facility], [address], [place], [place] [address]  Basic metabolic panel with GFR     Status: Abnormal   Collection Time: 08/16/24  4:03 PM  Result Value Ref Range   Sodium 135 135 - 145 mmol/L   Potassium 5.3 (H) 3.5 - 5.1 mmol/L   Chloride 104 98 - 111 mmol/L   CO2 20 (L) 22 - 32 mmol/L   Glucose, Bld 130 (H) 70 - 99 mg/dL    Comment: Glucose reference range applies only to samples taken after fasting for at least 8 hours.   BUN 20 6 - 20 mg/dL   Creatinine, Ser 8.74 (H)  0.44 - 1.00 mg/dL   Calcium 9.5 8.9 - 89.6 mg/dL   GFR, Estimated 53 (L) >60 mL/min    Comment: (NOTE) Calculated using the CKD-EPI Creatinine Equation (2021)    Anion gap 11 5 - 15    Comment: Performed at [facility], [address] [place], [place] [address]      Assessment & Plan:  Return for fasting lab work. Referral sent to dermatology. Amlodipine  2.5 mg daily  Problem List Items Addressed This Visit       Cardiovascular and Mediastinum   Hypertension associated with diabetes (HCC)   Relevant Medications   amLODipine  (NORVASC ) 2.5 MG tablet   Other Relevant Orders   CMP14+EGFR   POCT Urine Albumin/Creatinine with ratio [[id number]]   Raynaud's disease without gangrene   Relevant Medications   amLODipine  (NORVASC ) 2.5 MG tablet  Endocrine   Type 2 diabetes mellitus with hyperglycemia, without long-term current use of insulin  (HCC) - Primary   Relevant Orders   CMP14+EGFR   Hemoglobin A1c   Combined hyperlipidemia associated with type 2 diabetes mellitus (HCC)   Relevant Medications   amLODipine  (NORVASC ) 2.5 MG tablet   Other Relevant Orders   Lipid Profile     Other   Tingling in extremities   Relevant Orders   Vitamin B12   Other Visit Diagnoses       Thyroid disorder screening       Relevant Orders   TSH     Change in skin mole       Relevant Orders   Ambulatory referral to Dermatology       Return in about 4 weeks (around 09/28/2024).   Total time spent: 25 minutes. This time includes review of previous notes and results and patient face to face interaction during today's visit.    Jeoffrey Pollen, NP  08/31/2024   This document may have been prepared by Dragon Voice Recognition software and as such may include unintentional dictation errors.

## 2024-09-03 ENCOUNTER — Ambulatory Visit: Payer: Self-pay | Admitting: Cardiology

## 2024-09-03 ENCOUNTER — Ambulatory Visit
Admission: RE | Admit: 2024-09-03 | Discharge: 2024-09-03 | Disposition: A | Discharge status: Home / Self Care | Source: Ambulatory Visit | Attending: Cardiology | Admitting: Cardiology

## 2024-09-03 ENCOUNTER — Other Ambulatory Visit

## 2024-09-03 ENCOUNTER — Other Ambulatory Visit: Payer: Self-pay | Admitting: Cardiology

## 2024-09-03 ENCOUNTER — Other Ambulatory Visit (INDEPENDENT_AMBULATORY_CARE_PROVIDER_SITE_OTHER): Payer: Self-pay

## 2024-09-03 DIAGNOSIS — R202 Paresthesia of skin: Secondary | ICD-10-CM | POA: Diagnosis not present

## 2024-09-03 DIAGNOSIS — Z1329 Encounter for screening for other suspected endocrine disorder: Secondary | ICD-10-CM | POA: Diagnosis not present

## 2024-09-03 DIAGNOSIS — E1165 Type 2 diabetes mellitus with hyperglycemia: Secondary | ICD-10-CM | POA: Diagnosis not present

## 2024-09-03 DIAGNOSIS — R928 Other abnormal and inconclusive findings on diagnostic imaging of breast: Secondary | ICD-10-CM

## 2024-09-03 DIAGNOSIS — E1169 Type 2 diabetes mellitus with other specified complication: Secondary | ICD-10-CM | POA: Diagnosis not present

## 2024-09-03 DIAGNOSIS — E1159 Type 2 diabetes mellitus with other circulatory complications: Secondary | ICD-10-CM | POA: Diagnosis not present

## 2024-09-03 DIAGNOSIS — I152 Hypertension secondary to endocrine disorders: Secondary | ICD-10-CM | POA: Diagnosis not present

## 2024-09-03 DIAGNOSIS — E782 Mixed hyperlipidemia: Secondary | ICD-10-CM | POA: Diagnosis not present

## 2024-09-03 LAB — POC CREATINE & ALBUMIN,URINE
Creatinine, POC: 100 mg/dL
Microalbumin Ur, POC: 80 mg/L

## 2024-09-04 LAB — CMP14+EGFR
ALT: 39 IU/L — ABNORMAL HIGH (ref 0–32)
AST: 27 IU/L (ref 0–40)
Albumin: 4.5 g/dL (ref 3.9–4.9)
Alkaline Phosphatase: 113 IU/L (ref 41–116)
BUN/Creatinine Ratio: 28 — ABNORMAL HIGH (ref 9–23)
BUN: 22 mg/dL (ref 6–24)
Bilirubin Total: 0.4 mg/dL (ref 0.0–1.2)
CO2: 22 mmol/L (ref 20–29)
Calcium: 10.2 mg/dL (ref 8.7–10.2)
Chloride: 101 mmol/L (ref 96–106)
Creatinine, Ser: 0.79 mg/dL (ref 0.57–1.00)
Globulin, Total: 2.2 g/dL (ref 1.5–4.5)
Glucose: 122 mg/dL — ABNORMAL HIGH (ref 70–99)
Potassium: 5.1 mmol/L (ref 3.5–5.2)
Sodium: 138 mmol/L (ref 134–144)
Total Protein: 6.7 g/dL (ref 6.0–8.5)
eGFR: 92 mL/min/1.73 (ref >59)

## 2024-09-04 LAB — HEMOGLOBIN A1C
Est. average glucose Bld gHb Est-mCnc: 120 mg/dL
Hgb A1c MFr Bld: 5.8 % — ABNORMAL HIGH (ref 4.8–5.6)

## 2024-09-04 LAB — TSH: TSH: 2.11 u[IU]/mL (ref 0.450–4.500)

## 2024-09-04 LAB — LIPID PANEL
Chol/HDL Ratio: 1.6 ratio (ref 0.0–4.4)
Cholesterol, Total: 146 mg/dL (ref 100–199)
HDL: 89 mg/dL (ref >39)
LDL Chol Calc (NIH): 39 mg/dL (ref 0–99)
Triglycerides: 103 mg/dL (ref 0–149)
VLDL Cholesterol Cal: 18 mg/dL (ref 5–40)

## 2024-09-04 LAB — VITAMIN B12: Vitamin B-12: 814 pg/mL (ref 232–1245)

## 2024-09-06 ENCOUNTER — Ambulatory Visit: Payer: Self-pay | Admitting: Cardiology

## 2024-09-13 DIAGNOSIS — H0288A Meibomian gland dysfunction right eye, upper and lower eyelids: Secondary | ICD-10-CM | POA: Diagnosis not present

## 2024-09-13 DIAGNOSIS — H0288B Meibomian gland dysfunction left eye, upper and lower eyelids: Secondary | ICD-10-CM | POA: Diagnosis not present

## 2024-09-13 DIAGNOSIS — H04123 Dry eye syndrome of bilateral lacrimal glands: Secondary | ICD-10-CM | POA: Diagnosis not present

## 2024-09-13 DIAGNOSIS — E119 Type 2 diabetes mellitus without complications: Secondary | ICD-10-CM | POA: Diagnosis not present

## 2024-09-13 DIAGNOSIS — H2513 Age-related nuclear cataract, bilateral: Secondary | ICD-10-CM | POA: Diagnosis not present

## 2024-09-14 ENCOUNTER — Ambulatory Visit
Admission: RE | Admit: 2024-09-14 | Discharge: 2024-09-14 | Disposition: A | Discharge status: Home / Self Care | Source: Ambulatory Visit | Attending: Cardiology | Admitting: Cardiology

## 2024-09-14 ENCOUNTER — Telehealth: Payer: Self-pay | Admitting: Physician Assistant

## 2024-09-14 ENCOUNTER — Ambulatory Visit
Admission: RE | Admit: 2024-09-14 | Discharge: 2024-09-14 | Disposition: A | Source: Ambulatory Visit | Attending: Cardiology

## 2024-09-14 ENCOUNTER — Ambulatory Visit: Admitting: Physician Assistant

## 2024-09-14 ENCOUNTER — Ambulatory Visit: Admitting: Cardiology

## 2024-09-14 VITALS — BP 120/70 | HR 73 | Ht <= 58 in | Wt 113.6 lb

## 2024-09-14 DIAGNOSIS — R928 Other abnormal and inconclusive findings on diagnostic imaging of breast: Secondary | ICD-10-CM | POA: Insufficient documentation

## 2024-09-14 DIAGNOSIS — E1159 Type 2 diabetes mellitus with other circulatory complications: Secondary | ICD-10-CM

## 2024-09-14 DIAGNOSIS — I152 Hypertension secondary to endocrine disorders: Secondary | ICD-10-CM

## 2024-09-14 DIAGNOSIS — H60392 Other infective otitis externa, left ear: Secondary | ICD-10-CM

## 2024-09-14 DIAGNOSIS — M5412 Radiculopathy, cervical region: Secondary | ICD-10-CM

## 2024-09-14 DIAGNOSIS — M5010 Cervical disc disorder with radiculopathy, unspecified cervical region: Secondary | ICD-10-CM | POA: Diagnosis not present

## 2024-09-14 DIAGNOSIS — M4624 Osteomyelitis of vertebra, thoracic region: Secondary | ICD-10-CM | POA: Diagnosis not present

## 2024-09-14 DIAGNOSIS — D241 Benign neoplasm of right breast: Secondary | ICD-10-CM | POA: Insufficient documentation

## 2024-09-14 DIAGNOSIS — D242 Benign neoplasm of left breast: Secondary | ICD-10-CM | POA: Insufficient documentation

## 2024-09-14 DIAGNOSIS — H6090 Unspecified otitis externa, unspecified ear: Secondary | ICD-10-CM | POA: Insufficient documentation

## 2024-09-14 HISTORY — PX: BREAST BIOPSY: SHX20

## 2024-09-14 MED ORDER — LIDOCAINE-EPINEPHRINE 1 %-1:100000 IJ SOLN
10.0000 mL | Freq: Once | INTRAMUSCULAR | Status: AC
  Administered 2024-09-14: 10 mL via INTRADERMAL
  Filled 2024-09-14: qty 10

## 2024-09-14 MED ORDER — METHYLPREDNISOLONE 4 MG PO TBPK: ORAL_TABLET | ORAL | 0 refills | Status: AC

## 2024-09-14 MED ORDER — LIDOCAINE 1 % OPTIME INJ - NO CHARGE
2.0000 mL | Freq: Once | INTRAMUSCULAR | Status: AC
  Administered 2024-09-14: 2 mL via INTRADERMAL
  Filled 2024-09-14: qty 2

## 2024-09-14 MED ORDER — CIPRO HC 0.2-1 % OT SUSP: 4.0000 [drp] | Freq: Two times a day (BID) | OTIC | 0 refills | Status: AC

## 2024-09-14 NOTE — Progress Notes (Signed)
 Established Patient Office Visit  Subjective:  Patient ID: Danielle Harrison, female    DOB: 03-14-1976  Age: 48 y.o. MRN: [id number]  Chief Complaint  Patient presents with   Acute Visit    L Ear Pain    Patient in office for an acute visit, complaining of left ear pain. Started over the weekend. Patient reports pulling some was out of her ear that was white. Hearing continues to be muffled, ear pain. On exam ear drum normal looking. Ear canal red and irritated. Will send in Cipro ear drops.  Blood pressure normal today. Kidney function improved on recent lab work.  Continue current medications.   Otalgia  There is pain in the left ear. This is a new problem. The current episode started in the past 7 days. The problem occurs constantly. The problem has been unchanged. There has been no fever. Associated symptoms include ear discharge and hearing loss. Pertinent negatives include no abdominal pain, coughing, diarrhea, headaches, rhinorrhea or sore throat. She has tried nothing for the symptoms. The treatment provided no relief.    No other concerns at this time.   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   HTN (hypertension)    MRSA bacteremia    Neuromuscular disorder (HCC)    weakness and numbness    Osteomyelitis of lumbar spine (HCC)    Osteomyelitis of toe of left foot (HCC)    Restless leg syndrome    SBO (small bowel obstruction) (HCC)    Small bowel intussusception (HCC) 10/19/2023   Stomach irritation     Past Surgical History:  Procedure Laterality Date   APPLICATION OF INTRAOPERATIVE CT SCAN N/A 11/25/2023   Procedure: APPLICATION OF INTRAOPERATIVE CT SCAN;  Surgeon: Claudene Penne ORN, MD;  Location: [facility];  Service: Neurosurgery;  Laterality: N/A;   BIOPSY  06/28/2019   Procedure:  BIOPSY;  Surgeon: Janalyn Keene NOVAK, MD;  Location: [facility];  Service: Endoscopy;;   BREAST BIOPSY Right 09/14/2024   US  RT BREAST BX W LOC DEV 1ST LESION IMG BX SPEC US  GUIDE 09/14/2024 [facility]   BREAST BIOPSY Left 09/14/2024   US  LT BREAST BX W LOC DEV EA ADD LESION IMG BX SPEC US  GUIDE 09/14/2024 [facility]   CESAREAN SECTION     x2   COLONOSCOPY WITH PROPOFOL  N/A 06/28/2019   Procedure: COLONOSCOPY WITH PROPOFOL ;  Surgeon: Janalyn Keene NOVAK, MD;  Location: [facility];  Service: Endoscopy;  Laterality: N/A;   ESOPHAGOGASTRODUODENOSCOPY (EGD) WITH PROPOFOL  N/A 06/28/2019   Procedure: ESOPHAGOGASTRODUODENOSCOPY (EGD) WITH PROPOFOL ;  Surgeon: Janalyn Keene NOVAK, MD;  Location: [facility];  Service: Endoscopy;  Laterality: N/A;   INCISION AND DRAINAGE Left 10/27/2023   Procedure: INCISION AND DRAINAGE LEFT FOOT;  Surgeon: Malvin Marsa FALCON, DPM;  Location: [facility];  Service: Orthopedics/Podiatry;  Laterality: Left;   INCISION AND DRAINAGE Left 10/30/2023   Procedure: LEFT FOOT FIRST METATARSAL PHALANGEAL JOINT RESECTION, ANTIBIOTIC SPACER, WASHOUT, WOUND CLOSURE;  Surgeon: Malvin Marsa FALCON, DPM;  Location: [facility];  Service: Orthopedics/Podiatry;  Laterality: Left;   IR LUMBAR DISC ASPIRATION W/IMG GUIDE  01/19/2024   METATARSAL HEAD EXCISION Left 03/03/2024   Procedure: EXCISION, METATARSAL BONE, HEAD;  Surgeon: Malvin Marsa FALCON, DPM;  Location: [facility];  Service: Orthopedics/Podiatry;  Laterality: Left;  LEFT FOOT REMOVAL AND REINSTERTION OF CEMENT SPACER   TEE WITHOUT CARDIOVERSION N/A 10/29/2023   Procedure: TRANSESOPHAGEAL ECHOCARDIOGRAM (TEE);  Surgeon: Perla Evalene PARAS, MD;  Location: [facility];  Service: Cardiovascular;  Laterality: N/A;   THORACIC LAMINECTOMY FOR EPIDURAL ABSCESS N/A 11/25/2023   Procedure: T6-T7 thoracic laminectomy, right sided transpedicular decompression and disc debridement;  Surgeon: Claudene Penne ORN, MD;  Location: [facility];  Service: Neurosurgery;  Laterality: N/A;   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   Procedure: LEFT FOOT 1ST MPJ RESECTION;  Surgeon: Malvin Marsa FALCON, DPM;  Location: [facility];  Service: Orthopedics/Podiatry;  Laterality: Left;   TRANSMETATARSAL AMPUTATION Left 10/30/2023   Procedure: LEFT FOOT 1ST MPJ RESECTION;  Surgeon: Malvin Marsa FALCON, DPM;  Location: [facility];  Service: Orthopedics/Podiatry;  Laterality: Left;    Social History   Socioeconomic History   Marital status: Married    Spouse name: Not on file   Number of children: Not on file   Years of education: Not on file   Highest education level: Not on file  Occupational History   Not on file  Tobacco Use   Smoking status: Former    Current packs/day: 0.50    Average packs/day: 0.5 packs/day for 6.0 years (3.0 ttl pk-yrs)    Types: Cigarettes   Smokeless tobacco: Never   Tobacco comments:    Quit 8/22  Vaping Use   Vaping status: Every Day   Substances: Nicotine, Flavoring  Substance and Sexual Activity   Alcohol use: Yes    Comment: rare   Drug use: Never   Sexual activity: Not on file  Other Topics Concern   Not on file  Social History Narrative   Not on file   Social Drivers of Health   Financial Resource Strain: Not on file  Food Insecurity: No Food Insecurity (01/15/2024)   Hunger Vital Sign    Worried About Running Out of Food in the Last Year: Never true    Ran Out of Food in the Last Year: Never true  Transportation Needs: No Transportation Needs (01/15/2024)   PRAPARE - Administrator, Civil Service (Medical): No    Lack of Transportation (Non-Medical): No  Physical Activity: Not on file  Stress: Not on file  Social Connections: Not on file  Intimate Partner Violence: Not At Risk (01/15/2024)   Humiliation, Afraid, Rape, and Kick questionnaire    Fear of Current or Ex-Partner: No    Emotionally Abused: No    Physically Abused: No    Sexually  Abused: No    Family History  Problem Relation Age of Onset   Arthritis Mother    Heart disease Father    Breast cancer Other        mggm   Colon cancer Neg Hx     Allergies  Allergen Reactions   Diclofenac Hives    Outpatient Medications Prior to Visit  Medication Sig   albuterol  (PROVENTIL ) (2.5 MG/3ML) 0.083% nebulizer solution Take 2.5 mg by nebulization every 6 (six) hours as needed for wheezing or shortness of breath.   amLODipine  (NORVASC ) 2.5 MG tablet Take 1 tablet (2.5 mg total) by mouth daily.   Blood Glucose Monitoring Suppl DEVI 1 each by Does not apply route as directed. Dispense based on patient and insurance preference. Use up to four times daily as directed. (FOR ICD-10 E10.9, E11.9).   budesonide -formoterol  (SYMBICORT ) 160-4.5 MCG/ACT inhaler INHALE 2 PUFFS INTO THE LUNGS TWICE A  DAY   ciclopirox  (PENLAC ) 8 % solution APPLY TOPICALLY AT BEDTIME OVER NAIL AND SURROUNDING SKIN. APPLY DAILY OVER PREVIOUS COAT. AFTER 7 DAYS, MAY REMOVE WITH ALCOHOL AND CONTINUE CYCLE.   doxycycline  (VIBRA -TABS) 100 MG tablet Take 1 tablet (100 mg total) by mouth 2 (two) times daily.   gabapentin  (NEURONTIN ) 300 MG capsule Take 2 capsules (600 mg total) by mouth 3 (three) times daily.   Glucose Blood (BLOOD GLUCOSE TEST STRIPS) STRP 1 each by Does not apply route as directed. Dispense based on patient and insurance preference. Use up to four times daily as directed. (FOR ICD-10 E10.9, E11.9).   Lancet Device MISC 1 each by Does not apply route as directed. Dispense based on patient and insurance preference. Use up to four times daily as directed. (FOR ICD-10 E10.9, E11.9).   Lancets MISC 1 each by Does not apply route as directed. Dispense based on patient and insurance preference. Use up to four times daily as directed. (FOR ICD-10 E10.9, E11.9).   meloxicam  (MOBIC ) 15 MG tablet TAKE 1 TABLET (15 MG TOTAL) BY MOUTH DAILY.   metFORMIN  (GLUCOPHAGE ) 500 MG tablet TAKE 1 TABLET BY MOUTH 2  TIMES DAILY WITH A MEAL.   methocarbamol  (ROBAXIN ) 500 MG tablet TAKE 1 TABLET BY MOUTH EVERY 6 HOURS AS NEEDED FOR MUSCLE SPASMS.   methylPREDNISolone (MEDROL DOSEPAK) 4 MG TBPK tablet Take by mouth daily, taper daily dose per package instructions.   metoprolol  tartrate (LOPRESSOR ) 100 MG tablet TAKE 1 TABLET BY MOUTH TWICE A DAY   naloxone (NARCAN) nasal spray 4 mg/0.1 mL SMARTSIG:Both Nares   oxyCODONE -acetaminophen  (PERCOCET) 5-325 MG tablet Take 1 tablet by mouth every 4 (four) hours as needed for severe pain (pain score 7-10).   rosuvastatin (CRESTOR) 5 MG tablet Take 5 mg by mouth at bedtime.   tiZANidine  (ZANAFLEX ) 4 MG tablet TAKE 0.5-1 TABLETS (2-4 MG TOTAL) BY MOUTH 3 (THREE) TIMES DAILY.   VENTOLIN  HFA 108 (90 Base) MCG/ACT inhaler INHALE 1 PUFF INTO THE LUNGS EVERY 6 HOURS AS NEEDED FOR WHEEZING OR SHORTNESS OF BREATH.   No facility-administered medications prior to visit.    Review of Systems  Constitutional: Negative.   HENT:  Positive for ear discharge, ear pain and hearing loss. Negative for rhinorrhea and sore throat.   Eyes: Negative.   Respiratory: Negative.  Negative for cough and shortness of breath.   Cardiovascular: Negative.  Negative for chest pain.  Gastrointestinal: Negative.  Negative for abdominal pain, constipation and diarrhea.  Genitourinary: Negative.   Musculoskeletal:  Negative for joint pain and myalgias.  Skin: Negative.   Neurological: Negative.  Negative for dizziness and headaches.  Endo/Heme/Allergies: Negative.   All other systems reviewed and are negative.      Objective:   BP 120/70   Pulse 73   Ht 4' 9 (1.448 m)   Wt 113 lb 9.6 oz (51.5 kg)   LMP 03/08/2024   SpO2 99%   BMI 24.58 kg/m   Vitals:   09/14/24 1353  BP: 120/70  Pulse: 73  Height: 4' 9 (1.448 m)  Weight: 113 lb 9.6 oz (51.5 kg)  SpO2: 99%  BMI (Calculated): 24.58    Physical Exam Vitals and nursing note reviewed.  Constitutional:      Appearance: Normal  appearance. She is normal weight.  HENT:     Head: Normocephalic and atraumatic.     Right Ear: Hearing, tympanic membrane and ear canal normal.     Left Ear: Decreased hearing noted.  Drainage and tenderness present.  No middle ear effusion. There is mastoid tenderness. Tympanic membrane is not injected or scarred.     Nose: Nose normal.     Mouth/Throat:     Mouth: Mucous membranes are moist.  Eyes:     Extraocular Movements: Extraocular movements intact.     Conjunctiva/sclera: Conjunctivae normal.     Pupils: Pupils are equal, round, and reactive to light.  Cardiovascular:     Rate and Rhythm: Normal rate and regular rhythm.     Pulses: Normal pulses.     Heart sounds: Normal heart sounds.  Pulmonary:     Effort: Pulmonary effort is normal.     Breath sounds: Normal breath sounds.  Abdominal:     General: Abdomen is flat. Bowel sounds are normal.     Palpations: Abdomen is soft.  Musculoskeletal:        General: Normal range of motion.     Cervical back: Normal range of motion.  Skin:    General: Skin is warm and dry.  Neurological:     General: No focal deficit present.     Mental Status: She is alert and oriented to person, place, and time.  Psychiatric:        Mood and Affect: Mood normal.        Behavior: Behavior normal.        Thought Content: Thought content normal.        Judgment: Judgment normal.      No results found for any visits on 09/14/24.  Recent Results (from the past 2160 hours)  C-reactive protein     Status: Abnormal   Collection Time: 08/12/24  9:25 AM  Result Value Ref Range   CRP 1.0 (H) <1.0 mg/dL    Comment: Performed at [facility], [address], [place], [place] [address]  Sedimentation rate     Status: None   Collection Time: 08/12/24  9:25 AM  Result Value Ref Range   Sed Rate 4 0 - 20 mm/hr    Comment: Performed at [facility], [address] [place], [place] [address]  Comprehensive metabolic panel with GFR      Status: Abnormal   Collection Time: 08/12/24  9:25 AM  Result Value Ref Range   Sodium 140 135 - 145 mmol/L   Potassium 5.8 (H) 3.5 - 5.1 mmol/L   Chloride 105 98 - 111 mmol/L   CO2 25 22 - 32 mmol/L   Glucose, Bld 148 (H) 70 - 99 mg/dL    Comment: Glucose reference range applies only to samples taken after fasting for at least 8 hours.   BUN 22 (H) 6 - 20 mg/dL   Creatinine, Ser 9.33 0.44 - 1.00 mg/dL   Calcium 9.9 8.9 - 89.6 mg/dL   Total Protein 7.5 6.5 - 8.1 g/dL   Albumin 4.3 3.5 - 5.0 g/dL   AST 33 15 - 41 U/L   ALT 33 0 - 44 U/L   Alkaline Phosphatase 123 38 - 126 U/L   Total Bilirubin 0.3 0.0 - 1.2 mg/dL   GFR, Estimated >39 >39 mL/min    Comment: (NOTE) Calculated using the CKD-EPI Creatinine Equation (2021)    Anion gap 10 5 - 15    Comment: Performed at [facility], [address], [place], [place] [address]  Basic metabolic panel with GFR     Status: Abnormal   Collection Time: 08/16/24  4:03 PM  Result Value Ref Range  Sodium 135 135 - 145 mmol/L   Potassium 5.3 (H) 3.5 - 5.1 mmol/L   Chloride 104 98 - 111 mmol/L   CO2 20 (L) 22 - 32 mmol/L   Glucose, Bld 130 (H) 70 - 99 mg/dL    Comment: Glucose reference range applies only to samples taken after fasting for at least 8 hours.   BUN 20 6 - 20 mg/dL   Creatinine, Ser 8.74 (H) 0.44 - 1.00 mg/dL   Calcium 9.5 8.9 - 89.6 mg/dL   GFR, Estimated 53 (L) >60 mL/min    Comment: (NOTE) Calculated using the CKD-EPI Creatinine Equation (2021)    Anion gap 11 5 - 15    Comment: Performed at [facility], [address] [place], [place] [address]  Lipid Profile     Status: None   Collection Time: 09/03/24  8:44 AM  Result Value Ref Range   Cholesterol, Total 146 100 - 199 mg/dL   Triglycerides 896 0 - 149 mg/dL   HDL 89 >60 mg/dL   VLDL Cholesterol Cal 18 5 - 40 mg/dL   LDL Chol Calc (NIH) 39 0 - 99 mg/dL   Chol/HDL Ratio 1.6 0.0 - 4.4 ratio    Comment:                                   T.  Chol/HDL Ratio                                             Men  Women                               1/2 Avg.Risk  3.4    3.3                                   Avg.Risk  5.0    4.4                                2X Avg.Risk  9.6    7.1                                3X Avg.Risk 23.4   11.0   CMP14+EGFR     Status: Abnormal   Collection Time: 09/03/24  8:44 AM  Result Value Ref Range   Glucose 122 (H) 70 - 99 mg/dL   BUN 22 6 - 24 mg/dL   Creatinine, Ser 9.20 0.57 - 1.00 mg/dL   eGFR 92 >40 fO/fpw/8.26   BUN/Creatinine Ratio 28 (H) 9 - 23   Sodium 138 134 - 144 mmol/L   Potassium 5.1 3.5 - 5.2 mmol/L   Chloride 101 96 - 106 mmol/L   CO2 22 20 - 29 mmol/L   Calcium 10.2 8.7 - 10.2 mg/dL   Total Protein 6.7 6.0 - 8.5 g/dL   Albumin 4.5 3.9 - 4.9 g/dL   Globulin, Total 2.2 1.5 - 4.5 g/dL   Bilirubin Total 0.4 0.0 - 1.2 mg/dL   Alkaline Phosphatase 113 41 - 116 IU/L  AST 27 0 - 40 IU/L   ALT 39 (H) 0 - 32 IU/L  Hemoglobin A1c     Status: Abnormal   Collection Time: 09/03/24  8:44 AM  Result Value Ref Range   Hgb A1c MFr Bld 5.8 (H) 4.8 - 5.6 %    Comment:          Prediabetes: 5.7 - 6.4          Diabetes: >6.4          Glycemic control for adults with diabetes: <7.0    Est. average glucose Bld gHb Est-mCnc 120 mg/dL  TSH     Status: None   Collection Time: 09/03/24  8:44 AM  Result Value Ref Range   TSH 2.110 0.450 - 4.500 uIU/mL  Vitamin B12     Status: None   Collection Time: 09/03/24  8:44 AM  Result Value Ref Range   Vitamin B-12 814 232 - 1,245 pg/mL  POC CREATINE & ALBUMIN,URINE     Status: Abnormal   Collection Time: 09/03/24 10:38 AM  Result Value Ref Range   Microalbumin Ur, POC 80 mg/L   Creatinine, POC 100 mg/dL   Albumin/Creatinine Ratio, Urine, POC 30-300       Assessment & Plan:  Cipro ear drops Continue current medications  Problem List Items Addressed This Visit       Cardiovascular and Mediastinum   Hypertension associated with diabetes (HCC)      Nervous and Auditory   Otitis externa - Primary    Return if symptoms worsen or fail to improve.   Total time spent: 25 minutes. This time includes review of previous notes and results and patient face to face interaction during today's visit.    Jeoffrey Pollen, NP  09/14/2024   This document may have been prepared by [facility] Voice Recognition software and as such may include unintentional dictation errors.

## 2024-09-14 NOTE — Telephone Encounter (Signed)
 Please see telephone note in regards to this call.

## 2024-09-14 NOTE — Progress Notes (Signed)
 Patient is approximately 9 months  status post thoracic decompression and fusion as well as debridement for osteomyelitis.  She came in approximately 1 month ago for increased neck pain as well as symptoms consistent with Raynaud's.  She feels that her pain is getting worse and she feels as though she has a burning sensation especially near her left shoulder blade.  That extends up into her shoulder.  On her most recent x-rays she does have moderate multilevel cervical disc disease.  Due to her progressing symptoms that have been ongoing for 6 weeks and previous history of osteomyelitis, would like patient to go ahead and undergo MRI of her cervical spine.  This has been ordered.  Will also have patient start Medrol  Dosepak.  She was advised on stopping the Mobic  while she is taking this.  All risks and benefits regarding this medication were discussed at length.  She agrees to continue.   EXAM: CERVICAL SPINE - COMPLETE 4+ VIEW   COMPARISON:  None Available.   FINDINGS: No fracture or static subluxation of the cervical spine. Mild degenerative straightening of the normal cervical lordosis. Moderate multilevel cervical disc degenerative disease, worst at C3-C4 and C5-C7. No dynamic listhesis on flexion and extension views.   IMPRESSION: 1. No fracture or static subluxation of the cervical spine. 2. Moderate multilevel cervical disc degenerative disease, worst at C3-C4 and C5-C7. Cervical disc and neural foraminal pathology may be further evaluated by MRI if indicated by neurologically localizing signs and symptoms. 3. No dynamic listhesis on flexion and extension views.   This visit was performed via telephone.  Patient location: home Provider location: office  I spent a total of 13 minutes non-face-to-face activities for this visit on the date of this encounter including review of current clinical condition and response to treatment.  The patient is aware of and accepts the limits of  this telehealth visit.

## 2024-09-15 ENCOUNTER — Encounter: Payer: Self-pay | Admitting: Physician Assistant

## 2024-09-15 LAB — SURGICAL PATHOLOGY

## 2024-09-20 ENCOUNTER — Other Ambulatory Visit

## 2024-09-22 ENCOUNTER — Other Ambulatory Visit: Payer: Self-pay | Admitting: Cardiology

## 2024-09-27 DIAGNOSIS — F1220 Cannabis dependence, uncomplicated: Secondary | ICD-10-CM | POA: Diagnosis not present

## 2024-09-27 DIAGNOSIS — M5134 Other intervertebral disc degeneration, thoracic region: Secondary | ICD-10-CM | POA: Diagnosis not present

## 2024-09-27 DIAGNOSIS — E559 Vitamin D deficiency, unspecified: Secondary | ICD-10-CM | POA: Diagnosis not present

## 2024-09-27 DIAGNOSIS — E1142 Type 2 diabetes mellitus with diabetic polyneuropathy: Secondary | ICD-10-CM | POA: Diagnosis not present

## 2024-09-27 DIAGNOSIS — E7800 Pure hypercholesterolemia, unspecified: Secondary | ICD-10-CM | POA: Diagnosis not present

## 2024-09-27 DIAGNOSIS — M79671 Pain in right foot: Secondary | ICD-10-CM | POA: Diagnosis not present

## 2024-09-27 DIAGNOSIS — Z79899 Other long term (current) drug therapy: Secondary | ICD-10-CM | POA: Diagnosis not present

## 2024-09-27 DIAGNOSIS — F17210 Nicotine dependence, cigarettes, uncomplicated: Secondary | ICD-10-CM | POA: Diagnosis not present

## 2024-09-27 DIAGNOSIS — M86072 Acute hematogenous osteomyelitis, left ankle and foot: Secondary | ICD-10-CM | POA: Diagnosis not present

## 2024-09-27 DIAGNOSIS — M961 Postlaminectomy syndrome, not elsewhere classified: Secondary | ICD-10-CM | POA: Diagnosis not present

## 2024-09-27 DIAGNOSIS — T402X5A Adverse effect of other opioids, initial encounter: Secondary | ICD-10-CM | POA: Diagnosis not present

## 2024-09-27 DIAGNOSIS — R2681 Unsteadiness on feet: Secondary | ICD-10-CM | POA: Diagnosis not present

## 2024-09-27 DIAGNOSIS — K5903 Drug induced constipation: Secondary | ICD-10-CM | POA: Diagnosis not present

## 2024-09-27 DIAGNOSIS — M79672 Pain in left foot: Secondary | ICD-10-CM | POA: Diagnosis not present

## 2024-09-28 ENCOUNTER — Other Ambulatory Visit: Payer: Self-pay | Admitting: Podiatry

## 2024-09-30 ENCOUNTER — Ambulatory Visit: Admitting: Cardiology

## 2024-10-04 ENCOUNTER — Encounter: Payer: Self-pay | Admitting: Cardiology

## 2024-10-04 ENCOUNTER — Ambulatory Visit: Admitting: Cardiology

## 2024-10-04 VITALS — BP 114/82 | HR 82 | Ht <= 58 in | Wt 115.8 lb

## 2024-10-04 DIAGNOSIS — I7300 Raynaud's syndrome without gangrene: Secondary | ICD-10-CM

## 2024-10-04 DIAGNOSIS — I152 Hypertension secondary to endocrine disorders: Secondary | ICD-10-CM | POA: Diagnosis not present

## 2024-10-04 DIAGNOSIS — K5903 Drug induced constipation: Secondary | ICD-10-CM | POA: Diagnosis not present

## 2024-10-04 DIAGNOSIS — E1159 Type 2 diabetes mellitus with other circulatory complications: Secondary | ICD-10-CM | POA: Diagnosis not present

## 2024-10-04 MED ORDER — SENNOSIDES-DOCUSATE SODIUM 8.6-50 MG PO TABS: 1.0000 | ORAL_TABLET | Freq: Every day | ORAL | 4 refills | Status: AC

## 2024-10-04 MED ORDER — AMLODIPINE BESYLATE 5 MG PO TABS: 5.0000 mg | ORAL_TABLET | Freq: Every day | ORAL | 11 refills | Status: AC

## 2024-10-04 MED ORDER — ALBUTEROL SULFATE HFA 108 (90 BASE) MCG/ACT IN AERS: 1.0000 | INHALATION_SPRAY | Freq: Four times a day (QID) | RESPIRATORY_TRACT | 7 refills | Status: AC | PRN

## 2024-10-04 NOTE — Progress Notes (Unsigned)
 "  Established Patient Office Visit  Subjective:  Patient ID: Danielle Harrison, female    DOB: 25-Oct-1975  Age: 48 y.o. MRN: [id number]  Chief Complaint  Patient presents with   Follow-up    4 week follow up    Patient in office for 4 week follow up. Patient doing well, no new complaints. Patient started on amlodipine  at previous visit for Raynaud's phenomenon. Patient reports finger pain improved but still there, will increase amlodipine  to 5 mg daily.  Patient continues to have constipation, likely from chronic narcotic use. Will send in a stool softener.  Blood pressure well controlled today. Denies dizziness.  Continue all other medications.     No other concerns at this time.   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   HTN (hypertension)    MRSA bacteremia    Neuromuscular disorder (HCC)    weakness and numbness    Osteomyelitis of lumbar spine (HCC)    Osteomyelitis of toe of left foot (HCC)    Restless leg syndrome    SBO (small bowel obstruction) (HCC)    Small bowel intussusception (HCC) 10/19/2023   Stomach irritation     Past Surgical History:  Procedure Laterality Date   APPLICATION OF INTRAOPERATIVE CT SCAN N/A 11/25/2023   Procedure: APPLICATION OF INTRAOPERATIVE CT SCAN;  Surgeon: Claudene Penne ORN, MD;  Location: [facility];  Service: Neurosurgery;  Laterality: N/A;   BIOPSY  06/28/2019   Procedure: BIOPSY;  Surgeon: Janalyn Keene NOVAK, MD;  Location: [facility];  Service: Endoscopy;;   BREAST BIOPSY Right 09/14/2024   US  RT BREAST BX W LOC DEV 1ST LESION IMG BX SPEC US  GUIDE 09/14/2024 [facility]   BREAST BIOPSY Left 09/14/2024   US  LT BREAST BX W LOC DEV EA ADD LESION IMG BX SPEC US  GUIDE 09/14/2024 [facility]   CESAREAN SECTION     x2   COLONOSCOPY WITH  PROPOFOL  N/A 06/28/2019   Procedure: COLONOSCOPY WITH PROPOFOL ;  Surgeon: Janalyn Keene NOVAK, MD;  Location: [facility];  Service: Endoscopy;  Laterality: N/A;   ESOPHAGOGASTRODUODENOSCOPY (EGD) WITH PROPOFOL  N/A 06/28/2019   Procedure: ESOPHAGOGASTRODUODENOSCOPY (EGD) WITH PROPOFOL ;  Surgeon: Janalyn Keene NOVAK, MD;  Location: [facility];  Service: Endoscopy;  Laterality: N/A;   INCISION AND DRAINAGE Left 10/27/2023   Procedure: INCISION AND DRAINAGE LEFT FOOT;  Surgeon: Malvin Marsa FALCON, DPM;  Location: [facility];  Service: Orthopedics/Podiatry;  Laterality: Left;   INCISION AND DRAINAGE Left 10/30/2023   Procedure: LEFT FOOT FIRST METATARSAL PHALANGEAL JOINT RESECTION, ANTIBIOTIC SPACER, WASHOUT, WOUND CLOSURE;  Surgeon: Malvin Marsa FALCON, DPM;  Location: [facility];  Service: Orthopedics/Podiatry;  Laterality: Left;   IR LUMBAR DISC ASPIRATION W/IMG GUIDE  01/19/2024   METATARSAL HEAD EXCISION Left 03/03/2024   Procedure: EXCISION, METATARSAL BONE, HEAD;  Surgeon: Malvin Marsa FALCON, DPM;  Location: [facility];  Service: Orthopedics/Podiatry;  Laterality: Left;  LEFT FOOT REMOVAL AND REINSTERTION OF CEMENT SPACER   TEE WITHOUT CARDIOVERSION N/A 10/29/2023   Procedure: TRANSESOPHAGEAL ECHOCARDIOGRAM (TEE);  Surgeon: Perla Evalene PARAS, MD;  Location: [facility];  Service: Cardiovascular;  Laterality: N/A;   THORACIC LAMINECTOMY FOR EPIDURAL ABSCESS N/A 11/25/2023   Procedure: T6-T7 thoracic laminectomy, right sided transpedicular decompression and disc debridement;  Surgeon: Claudene,  Penne ORN, MD;  Location: [facility];  Service: Neurosurgery;  Laterality: N/A;   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   Procedure: LEFT FOOT 1ST MPJ RESECTION;  Surgeon: Malvin Marsa FALCON, DPM;  Location: [facility];  Service: Orthopedics/Podiatry;  Laterality: Left;   TRANSMETATARSAL AMPUTATION Left 10/30/2023   Procedure: LEFT FOOT 1ST MPJ RESECTION;  Surgeon: Malvin Marsa FALCON, DPM;  Location: [facility];  Service: Orthopedics/Podiatry;  Laterality: Left;    Social History   Socioeconomic History   Marital status: Married    Spouse name: Not on file   Number of children: Not on file   Years of education: Not on file   Highest education level: Not on file  Occupational History   Not on file  Tobacco Use   Smoking status: Former    Current packs/day: 0.50    Average packs/day: 0.5 packs/day for 6.0 years (3.0 ttl pk-yrs)    Types: Cigarettes   Smokeless tobacco: Never   Tobacco comments:    Quit 8/22  Vaping Use   Vaping status: Every Day   Substances: Nicotine, Flavoring  Substance and Sexual Activity   Alcohol use: Yes    Comment: rare   Drug use: Never   Sexual activity: Not on file  Other Topics Concern   Not on file  Social History Narrative   Not on file   Social Drivers of Health   Tobacco Use: Medium Risk (10/04/2024)   Patient History    Smoking Tobacco Use: Former    Smokeless Tobacco Use: Never    Passive Exposure: Not on Actuary Strain: Not on file  Food Insecurity: No Food Insecurity (01/15/2024)   Hunger Vital Sign    Worried About Running Out of Food in the Last Year: Never true    Ran Out of Food in the Last Year: Never true  Transportation Needs: No Transportation Needs (01/15/2024)   PRAPARE - Administrator, Civil Service (Medical): No    Lack of Transportation (Non-Medical): No  Physical Activity: Not on file  Stress: Not on file  Social Connections: Not on file  Intimate Partner Violence: Not At Risk (01/15/2024)   Humiliation, Afraid, Rape, and Kick questionnaire    Fear of Current or Ex-Partner: No    Emotionally Abused: No    Physically Abused: No    Sexually Abused: No  Depression (PHQ2-9): Low Risk (05/03/2024)   Depression (PHQ2-9)    PHQ-2 Score: 0  Alcohol Screen: Not on file  Housing: Low Risk (01/15/2024)   Housing Stability Vital Sign    Unable to Pay for Housing in the Last  Year: No    Number of Times Moved in the Last Year: 1    Homeless in the Last Year: No  Recent Concern: Housing - High Risk (11/24/2023)   Housing Stability Vital Sign    Unable to Pay for Housing in the Last Year: Yes    Number of Times Moved in the Last Year: 1    Homeless in the Last Year: No  Utilities: Not At Risk (01/15/2024)   [employer] Utilities    Threatened with loss of utilities: No  Health Literacy: Not on file    Family History  Problem Relation Age of Onset   Arthritis Mother    Heart disease Father    Breast cancer Other        mggm   Colon cancer Neg Hx     Allergies[1]  Show/hide medication list[2]  Review  of Systems  Constitutional: Negative.   HENT: Negative.    Eyes: Negative.   Respiratory: Negative.  Negative for shortness of breath.   Cardiovascular: Negative.  Negative for chest pain.  Gastrointestinal:  Positive for constipation. Negative for abdominal pain and diarrhea.  Genitourinary: Negative.   Musculoskeletal:  Negative for joint pain and myalgias.  Skin: Negative.   Neurological: Negative.  Negative for dizziness and headaches.  Endo/Heme/Allergies: Negative.   Psychiatric/Behavioral: Negative.    All other systems reviewed and are negative.      Objective:   BP 114/82   Pulse 82   Ht 4' 9 (1.448 m)   Wt 115 lb 12.8 oz (52.5 kg)   LMP 03/08/2024   SpO2 99%   BMI 25.06 kg/m   Vitals:   10/04/24 1517  BP: 114/82  Pulse: 82  Height: 4' 9 (1.448 m)  Weight: 115 lb 12.8 oz (52.5 kg)  SpO2: 99%  BMI (Calculated): 25.05    Physical Exam Vitals and nursing note reviewed.  Constitutional:      Appearance: Normal appearance. She is normal weight.  HENT:     Head: Normocephalic and atraumatic.     Nose: Nose normal.     Mouth/Throat:     Mouth: Mucous membranes are moist.  Eyes:     Extraocular Movements: Extraocular movements intact.     Conjunctiva/sclera: Conjunctivae normal.     Pupils: Pupils are equal, round, and  reactive to light.  Cardiovascular:     Rate and Rhythm: Normal rate and regular rhythm.     Pulses: Normal pulses.     Heart sounds: Normal heart sounds.  Pulmonary:     Effort: Pulmonary effort is normal.     Breath sounds: Normal breath sounds.  Abdominal:     General: Abdomen is flat. Bowel sounds are normal.     Palpations: Abdomen is soft.  Musculoskeletal:        General: Normal range of motion.     Cervical back: Normal range of motion.  Skin:    General: Skin is warm and dry.  Neurological:     General: No focal deficit present.     Mental Status: She is alert and oriented to person, place, and time.  Psychiatric:        Mood and Affect: Mood normal.        Behavior: Behavior normal.        Thought Content: Thought content normal.        Judgment: Judgment normal.      No results found for any visits on 10/04/24.  Recent Results (from the past 2160 hours)  C-reactive protein     Status: Abnormal   Collection Time: 08/12/24  9:25 AM  Result Value Ref Range   CRP 1.0 (H) <1.0 mg/dL    Comment: Performed at [facility], [address], [place], [place] [address]  Sedimentation rate     Status: None   Collection Time: 08/12/24  9:25 AM  Result Value Ref Range   Sed Rate 4 0 - 20 mm/hr    Comment: Performed at [facility] [address] [place], [place] [address]  Comprehensive metabolic panel with GFR     Status: Abnormal   Collection Time: 08/12/24  9:25 AM  Result Value Ref Range   Sodium 140 135 - 145 mmol/L   Potassium 5.8 (H) 3.5 - 5.1 mmol/L   Chloride 105 98 - 111 mmol/L   CO2 25  22 - 32 mmol/L   Glucose, Bld 148 (H) 70 - 99 mg/dL    Comment: Glucose reference range applies only to samples taken after fasting for at least 8 hours.   BUN 22 (H) 6 - 20 mg/dL   Creatinine, Ser 9.33 0.44 - 1.00 mg/dL   Calcium 9.9 8.9 - 89.6 mg/dL   Total Protein 7.5 6.5 - 8.1 g/dL   Albumin 4.3 3.5 - 5.0 g/dL   AST 33 15 - 41 U/L   ALT 33 0 - 44 U/L    Alkaline Phosphatase 123 38 - 126 U/L   Total Bilirubin 0.3 0.0 - 1.2 mg/dL   GFR, Estimated >39 >39 mL/min    Comment: (NOTE) Calculated using the CKD-EPI Creatinine Equation (2021)    Anion gap 10 5 - 15    Comment: Performed at [facility], [address], [place], [place] [address]  Basic metabolic panel with GFR     Status: Abnormal   Collection Time: 08/16/24  4:03 PM  Result Value Ref Range   Sodium 135 135 - 145 mmol/L   Potassium 5.3 (H) 3.5 - 5.1 mmol/L   Chloride 104 98 - 111 mmol/L   CO2 20 (L) 22 - 32 mmol/L   Glucose, Bld 130 (H) 70 - 99 mg/dL    Comment: Glucose reference range applies only to samples taken after fasting for at least 8 hours.   BUN 20 6 - 20 mg/dL   Creatinine, Ser 8.74 (H) 0.44 - 1.00 mg/dL   Calcium 9.5 8.9 - 89.6 mg/dL   GFR, Estimated 53 (L) >60 mL/min    Comment: (NOTE) Calculated using the CKD-EPI Creatinine Equation (2021)    Anion gap 11 5 - 15    Comment: Performed at [facility], [address] [place], [place] [address]  Lipid Profile     Status: None   Collection Time: 09/03/24  8:44 AM  Result Value Ref Range   Cholesterol, Total 146 100 - 199 mg/dL   Triglycerides 896 0 - 149 mg/dL   HDL 89 >60 mg/dL   VLDL Cholesterol Cal 18 5 - 40 mg/dL   LDL Chol Calc (NIH) 39 0 - 99 mg/dL   Chol/HDL Ratio 1.6 0.0 - 4.4 ratio    Comment:                                   T. Chol/HDL Ratio                                             Men  Women                               1/2 Avg.Risk  3.4    3.3                                   Avg.Risk  5.0    4.4                                2X Avg.Risk  9.6    7.1  3X Avg.Risk 23.4   11.0   CMP14+EGFR     Status: Abnormal   Collection Time: 09/03/24  8:44 AM  Result Value Ref Range   Glucose 122 (H) 70 - 99 mg/dL   BUN 22 6 - 24 mg/dL   Creatinine, Ser 9.20 0.57 - 1.00 mg/dL   eGFR 92 >40 fO/fpw/8.26   BUN/Creatinine Ratio 28 (H) 9 - 23    Sodium 138 134 - 144 mmol/L   Potassium 5.1 3.5 - 5.2 mmol/L   Chloride 101 96 - 106 mmol/L   CO2 22 20 - 29 mmol/L   Calcium 10.2 8.7 - 10.2 mg/dL   Total Protein 6.7 6.0 - 8.5 g/dL   Albumin 4.5 3.9 - 4.9 g/dL   Globulin, Total 2.2 1.5 - 4.5 g/dL   Bilirubin Total 0.4 0.0 - 1.2 mg/dL   Alkaline Phosphatase 113 41 - 116 IU/L   AST 27 0 - 40 IU/L   ALT 39 (H) 0 - 32 IU/L  Hemoglobin A1c     Status: Abnormal   Collection Time: 09/03/24  8:44 AM  Result Value Ref Range   Hgb A1c MFr Bld 5.8 (H) 4.8 - 5.6 %    Comment:          Prediabetes: 5.7 - 6.4          Diabetes: >6.4          Glycemic control for adults with diabetes: <7.0    Est. average glucose Bld gHb Est-mCnc 120 mg/dL  TSH     Status: None   Collection Time: 09/03/24  8:44 AM  Result Value Ref Range   TSH 2.110 0.450 - 4.500 uIU/mL  Vitamin B12     Status: None   Collection Time: 09/03/24  8:44 AM  Result Value Ref Range   Vitamin B-12 814 232 - 1,245 pg/mL  POC CREATINE & ALBUMIN,URINE     Status: Abnormal   Collection Time: 09/03/24 10:38 AM  Result Value Ref Range   Microalbumin Ur, POC 80 mg/L   Creatinine, POC 100 mg/dL   Albumin/Creatinine Ratio, Urine, POC 30-300   Surgical pathology     Status: None   Collection Time: 09/14/24 12:00 AM  Result Value Ref Range   SURGICAL PATHOLOGY      SURGICAL PATHOLOGY [facility] [address] [place], [place] [address] Telephone [phone] or [phone] Fax [phone]  REPORT OF SURGICAL PATHOLOGY   Accession #: [id number] Patient Name: RONNI, OSTERBERG Visit # : [id number]  MRN: [id number] Physician: Anice PONCE Rush DOB/Age [birth year] (Age: 37) Gender: F Collected Date: 09/14/2024 Received Date: 09/14/2024  FINAL DIAGNOSIS       1. Breast, right, needle core biopsy, 10:30 o'clock, 9cmfn, 9mm, heart clip :       -  FIBROADENOMA.       2. Breast, left, needle core biopsy, 12:30 o'clock, 3cmfn, 7mm, ribbon clip :        -  FIBROADENOMA WITH FIBROCYSTIC CHANGE, APOCRINE METAPLASIA AND FOCAL      CALCIFICATIONS.       ELECTRONIC SIGNATURE : Legolvan Do, Mark, Pathologist, Electronic Signature  MICROSCOPIC DESCRIPTION  CASE COMMENTS STAINS USED IN DIAGNOSIS: H&E-2 H&E-3 H&E-4 H&E H&E-2 H&E-3 H&E-4 H&E    CLINICAL HISTORY  SPECIMEN(S) OBTAINED 1. Bre ast, right, needle core biopsy, 10:30 O'clock, 9cmfn, 9mm, Heart Clip 2. Breast, left, needle core biopsy, 12:30 O'clock, 3cmfn, 7mm, Ribbon Clip  SPECIMEN COMMENTS: 1. TIF: 9:00 AM,  CIT < 3 minutes; low suspicion masses bilaterally 2. TIF: 9:12 AM, CIT < 3 minutes SPECIMEN CLINICAL INFORMATION: 1. FA vs LN vs other benign mass, R/O malignancy/atypia 2. FA vs LN vs other benign mass, R/O malignancy/atypia    Gross Description 1. Received in formalin, TIF 9:00 a.m.CIT less than 3 mins, are four cores of soft tan yellow tissue 0.6 to 0.8 cm in length and 0.2 cm in diameter.The specimen is submitted in toto. 2. Received in formalin, TIF 9:12 a.m.CIT less than 3 mins, are four cores of soft tan yellow tissue 0.5 to 1.1 cm in length and each 0.2 cm in diameter.The specimen is submitted in toto.(GP:gt, 09/14/24)        Report signed out from the following location(s) [facility] [address] [place], [place] [address] CLIA #: [id number]  [facility] [address] [place], [place] [address] CLIA #: [id number]       Assessment & Plan:  Increase amlodipine  to 5 mg daily Stool softener for constipation Continue all other medications  Problem List Items Addressed This Visit       Cardiovascular and Mediastinum   Hypertension associated with diabetes (HCC)   Relevant Medications   amLODipine  (NORVASC ) 5 MG tablet   Raynaud's disease without gangrene - Primary   Relevant Medications   amLODipine  (NORVASC ) 5 MG tablet     Digestive   Drug-induced constipation    Return in about 4  weeks (around 11/01/2024).   Total time spent: 25 minutes. This time includes review of previous notes and results and patient face to face interaction during today's visit.    Jeoffrey Pollen, NP  10/04/2024   This document may have been prepared by [facility] Voice Recognition software and as such may include unintentional dictation errors.      [1]  Allergies Allergen Reactions   Diclofenac Hives  [2]  Outpatient Medications Prior to Visit  Medication Sig   albuterol  (PROVENTIL ) (2.5 MG/3ML) 0.083% nebulizer solution Take 2.5 mg by nebulization every 6 (six) hours as needed for wheezing or shortness of breath.   Blood Glucose Monitoring Suppl DEVI 1 each by Does not apply route as directed. Dispense based on patient and insurance preference. Use up to four times daily as directed. (FOR ICD-10 E10.9, E11.9).   ciclopirox  (PENLAC ) 8 % solution APPLY TOPICALLY AT BEDTIME OVER NAIL AND SURROUNDING SKIN. APPLY DAILY OVER PREVIOUS COAT. AFTER 7 DAYS, MAY REMOVE WITH ALCOHOL AND CONTINUE CYCLE.   doxycycline  (VIBRA -TABS) 100 MG tablet Take 1 tablet (100 mg total) by mouth 2 (two) times daily.   gabapentin  (NEURONTIN ) 300 MG capsule Take 2 capsules (600 mg total) by mouth 3 (three) times daily.   Glucose Blood (BLOOD GLUCOSE TEST STRIPS) STRP 1 each by Does not apply route as directed. Dispense based on patient and insurance preference. Use up to four times daily as directed. (FOR ICD-10 E10.9, E11.9).   Lancet Device MISC 1 each by Does not apply route as directed. Dispense based on patient and insurance preference. Use up to four times daily as directed. (FOR ICD-10 E10.9, E11.9).   Lancets MISC 1 each by Does not apply route as directed. Dispense based on patient and insurance preference. Use up to four times daily as directed. (FOR ICD-10 E10.9, E11.9).   meloxicam  (MOBIC ) 15 MG tablet TAKE 1 TABLET (15 MG TOTAL) BY MOUTH DAILY.   metFORMIN  (GLUCOPHAGE ) 500 MG tablet TAKE 1 TABLET BY MOUTH 2  TIMES  DAILY WITH A MEAL.   methocarbamol  (ROBAXIN ) 500 MG tablet TAKE 1 TABLET BY MOUTH EVERY 6 HOURS AS NEEDED FOR MUSCLE SPASMS.   metoprolol  tartrate (LOPRESSOR ) 100 MG tablet TAKE 1 TABLET BY MOUTH TWICE A DAY   naloxone (NARCAN) nasal spray 4 mg/0.1 mL SMARTSIG:Both Nares   oxyCODONE -acetaminophen  (PERCOCET) 5-325 MG tablet Take 1 tablet by mouth every 4 (four) hours as needed for severe pain (pain score 7-10).   rosuvastatin (CRESTOR) 5 MG tablet Take 5 mg by mouth at bedtime.   SYMBICORT  160-4.5 MCG/ACT inhaler INHALE 2 PUFFS INTO THE LUNGS TWICE A DAY   tiZANidine  (ZANAFLEX ) 4 MG tablet TAKE 0.5-1 TABLETS (2-4 MG TOTAL) BY MOUTH 3 (THREE) TIMES DAILY.   [DISCONTINUED] amLODipine  (NORVASC ) 2.5 MG tablet Take 1 tablet (2.5 mg total) by mouth daily.   [DISCONTINUED] VENTOLIN  HFA 108 (90 Base) MCG/ACT inhaler INHALE 1 PUFF INTO THE LUNGS EVERY 6 HOURS AS NEEDED FOR WHEEZING OR SHORTNESS OF BREATH.   methylPREDNISolone  (MEDROL  DOSEPAK) 4 MG TBPK tablet Take by mouth daily, taper daily dose per package instructions. (Patient not taking: Reported on 10/04/2024)   No facility-administered medications prior to visit.   "

## 2024-10-05 DIAGNOSIS — K5903 Drug induced constipation: Secondary | ICD-10-CM | POA: Insufficient documentation

## 2024-10-18 VITALS — BP 122/78 | HR 82 | Ht <= 58 in | Wt 112.6 lb

## 2024-10-18 DIAGNOSIS — A4902 Methicillin resistant Staphylococcus aureus infection, unspecified site: Secondary | ICD-10-CM

## 2024-10-18 DIAGNOSIS — E1159 Type 2 diabetes mellitus with other circulatory complications: Secondary | ICD-10-CM | POA: Diagnosis not present

## 2024-10-18 DIAGNOSIS — H6502 Acute serous otitis media, left ear: Secondary | ICD-10-CM

## 2024-10-18 DIAGNOSIS — I152 Hypertension secondary to endocrine disorders: Secondary | ICD-10-CM

## 2024-10-18 MED ORDER — CIPROFLOXACIN-FLUOCINOLONE PF 0.3-0.025 % OT SOLN: 0.2500 mL | Freq: Two times a day (BID) | OTIC | 0 refills | Status: AC

## 2024-10-18 NOTE — Progress Notes (Signed)
 "  Established Patient Office Visit  Subjective:  Patient ID: Danielle Harrison, female    DOB: 1976/08/05  Age: 49 y.o. MRN: [id number]  Chief Complaint  Patient presents with   Acute Visit    Sore in nose, possible MRSA    Patient in office for an acute visit, states she has a sore in her nose, afraid she has MRSA. Patient also complaining of left ear pain. Patient reports pain started about a month ago. Patient was prescribed otic antibiotic, insurance would not pay for it. Patient returns today, pain is worse. On exam, blood noticed in ear, discharge. Will send in a different otic antibiotic that insurance will pay for.  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  Blood pressure controlled today.     No other concerns at this time.   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   HTN (hypertension)    MRSA bacteremia    Neuromuscular disorder (HCC)    weakness and numbness    Osteomyelitis of lumbar spine (HCC)    Osteomyelitis of toe of left foot (HCC)    Restless leg syndrome    SBO (small bowel obstruction) (HCC)    Small bowel intussusception (HCC) 10/19/2023   Stomach irritation     Past Surgical History:  Procedure Laterality Date   APPLICATION OF INTRAOPERATIVE CT SCAN N/A 11/25/2023   Procedure: APPLICATION OF INTRAOPERATIVE CT SCAN;  Surgeon: Claudene Penne ORN, MD;  Location: [facility];  Service: Neurosurgery;  Laterality: N/A;   BIOPSY  06/28/2019   Procedure: BIOPSY;  Surgeon: Janalyn Keene NOVAK, MD;  Location: [facility];  Service: Endoscopy;;   BREAST BIOPSY Right 09/14/2024   US  RT BREAST BX W LOC DEV 1ST LESION IMG BX SPEC US  GUIDE 09/14/2024 [facility]   BREAST BIOPSY Left 09/14/2024   US   LT BREAST BX W LOC DEV EA ADD LESION IMG BX SPEC US  GUIDE 09/14/2024 [facility]   CESAREAN SECTION     x2   COLONOSCOPY WITH PROPOFOL  N/A 06/28/2019   Procedure: COLONOSCOPY WITH PROPOFOL ;  Surgeon: Janalyn Keene NOVAK, MD;  Location: [facility];  Service: Endoscopy;  Laterality: N/A;   ESOPHAGOGASTRODUODENOSCOPY (EGD) WITH PROPOFOL  N/A 06/28/2019   Procedure: ESOPHAGOGASTRODUODENOSCOPY (EGD) WITH PROPOFOL ;  Surgeon: Janalyn Keene NOVAK, MD;  Location: [facility];  Service: Endoscopy;  Laterality: N/A;   INCISION AND DRAINAGE Left 10/27/2023   Procedure: INCISION AND DRAINAGE LEFT FOOT;  Surgeon: Malvin Marsa FALCON, DPM;  Location: [facility];  Service: Orthopedics/Podiatry;  Laterality: Left;   INCISION AND DRAINAGE Left 10/30/2023   Procedure: LEFT FOOT FIRST METATARSAL PHALANGEAL JOINT RESECTION, ANTIBIOTIC SPACER, WASHOUT, WOUND CLOSURE;  Surgeon: Malvin Marsa FALCON, DPM;  Location: [facility];  Service: Orthopedics/Podiatry;  Laterality: Left;   IR LUMBAR DISC ASPIRATION W/IMG GUIDE  01/19/2024   METATARSAL HEAD EXCISION Left 03/03/2024   Procedure: EXCISION, METATARSAL BONE, HEAD;  Surgeon: Malvin Marsa FALCON, DPM;  Location: [facility];  Service: Orthopedics/Podiatry;  Laterality: Left;  LEFT FOOT REMOVAL AND REINSTERTION OF CEMENT SPACER   TEE WITHOUT CARDIOVERSION N/A 10/29/2023   Procedure: TRANSESOPHAGEAL ECHOCARDIOGRAM (TEE);  Surgeon: Gollan, Timothy J, MD;  Location: [facility];  Service: Cardiovascular;  Laterality: N/A;   THORACIC LAMINECTOMY FOR EPIDURAL ABSCESS N/A 11/25/2023   Procedure: T6-T7 thoracic laminectomy, right sided transpedicular decompression and disc debridement;  Surgeon: Claudene Penne ORN, MD;  Location: [facility];  Service: Neurosurgery;  Laterality: N/A;   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   Procedure: LEFT FOOT 1ST MPJ RESECTION;  Surgeon: Malvin Marsa FALCON, DPM;  Location: [facility];  Service:  Orthopedics/Podiatry;  Laterality: Left;   TRANSMETATARSAL AMPUTATION Left 10/30/2023   Procedure: LEFT FOOT 1ST MPJ RESECTION;  Surgeon: Malvin Marsa FALCON, DPM;  Location: [facility];  Service: Orthopedics/Podiatry;  Laterality: Left;    Social History   Socioeconomic History   Marital status: Married    Spouse name: Not on file   Number of children: Not on file   Years of education: Not on file   Highest education level: Not on file  Occupational History   Not on file  Tobacco Use   Smoking status: Former    Current packs/day: 0.50    Average packs/day: 0.5 packs/day for 6.0 years (3.0 ttl pk-yrs)    Types: Cigarettes   Smokeless tobacco: Never   Tobacco comments:    Quit 8/22  Vaping Use   Vaping status: Every Day   Substances: Nicotine, Flavoring  Substance and Sexual Activity   Alcohol use: Yes    Comment: rare   Drug use: Never   Sexual activity: Not on file  Other Topics Concern   Not on file  Social History Narrative   Not on file   Social Drivers of Health   Tobacco Use: Medium Risk (10/04/2024)   Patient History    Smoking Tobacco Use: Former    Smokeless Tobacco Use: Never    Passive Exposure: Not on Actuary Strain: Not on file  Food Insecurity: No Food Insecurity (01/15/2024)   Hunger Vital Sign    Worried About Running Out of Food in the Last Year: Never true    Ran Out of Food in the Last Year: Never true  Transportation Needs: No Transportation Needs (01/15/2024)   PRAPARE - Administrator, Civil Service (Medical): No    Lack of Transportation (Non-Medical): No  Physical Activity: Not on file  Stress: Not on file  Social Connections: Not on file  Intimate Partner Violence: Not At Risk (01/15/2024)   Humiliation, Afraid, Rape, and Kick questionnaire    Fear of Current or Ex-Partner: No    Emotionally Abused: No    Physically Abused: No    Sexually Abused: No  Depression (PHQ2-9): Low Risk  (05/03/2024)   Depression (PHQ2-9)    PHQ-2 Score: 0  Alcohol Screen: Not on file  Housing: Low Risk (01/15/2024)   Housing Stability Vital Sign    Unable to Pay for Housing in the Last Year: No    Number of Times Moved in the Last Year: 1    Homeless in the Last Year: No  Recent Concern: Housing - High Risk (11/24/2023)   Housing Stability Vital Sign    Unable to Pay for Housing in the Last Year: Yes    Number of Times Moved in the Last Year: 1    Homeless in the Last Year: No  Utilities: Not At Risk (01/15/2024)   [employer] Utilities    Threatened with loss of utilities: No  Health Literacy: Not on file    Family History  Problem Relation Age of Onset   Arthritis Mother    Heart disease Father    Breast  cancer Other        mggm   Colon cancer Neg Hx     Allergies[1]  Show/hide medication list[2]  Review of Systems  Constitutional: Negative.   HENT:  Positive for ear pain.   Eyes: Negative.   Respiratory: Negative.  Negative for shortness of breath.   Cardiovascular: Negative.  Negative for chest pain.  Gastrointestinal: Negative.  Negative for abdominal pain, constipation and diarrhea.  Genitourinary: Negative.   Musculoskeletal:  Negative for joint pain and myalgias.  Skin: Negative.   Neurological: Negative.  Negative for dizziness and headaches.  Endo/Heme/Allergies: Negative.   All other systems reviewed and are negative.      Objective:   BP 122/78   Pulse 82   Ht 4' 9 (1.448 m)   Wt 112 lb 9.6 oz (51.1 kg)   LMP 03/08/2024   SpO2 98%   BMI 24.37 kg/m   Vitals:   10/18/24 1503  BP: 122/78  Pulse: 82  Height: 4' 9 (1.448 m)  Weight: 112 lb 9.6 oz (51.1 kg)  SpO2: 98%  BMI (Calculated): 24.36    Physical Exam Vitals and nursing note reviewed.  Constitutional:      Appearance: Normal appearance. She is normal weight.  HENT:     Head: Normocephalic and atraumatic.     Left Ear: Drainage, swelling and tenderness present. A middle ear  effusion is present.     Nose: Nose normal.     Mouth/Throat:     Mouth: Mucous membranes are moist.  Eyes:     Extraocular Movements: Extraocular movements intact.     Conjunctiva/sclera: Conjunctivae normal.     Pupils: Pupils are equal, round, and reactive to light.  Cardiovascular:     Rate and Rhythm: Normal rate and regular rhythm.     Pulses: Normal pulses.     Heart sounds: Normal heart sounds.  Pulmonary:     Effort: Pulmonary effort is normal.     Breath sounds: Normal breath sounds.  Abdominal:     General: Abdomen is flat. Bowel sounds are normal.     Palpations: Abdomen is soft.  Musculoskeletal:        General: Normal range of motion.     Cervical back: Normal range of motion.  Skin:    General: Skin is warm and dry.  Neurological:     General: No focal deficit present.     Mental Status: She is alert and oriented to person, place, and time.  Psychiatric:        Mood and Affect: Mood normal.        Behavior: Behavior normal.        Thought Content: Thought content normal.        Judgment: Judgment normal.      No results found for any visits on 10/18/24.  Recent Results (from the past 2160 hours)  C-reactive protein     Status: Abnormal   Collection Time: 08/12/24  9:25 AM  Result Value Ref Range   CRP 1.0 (H) <1.0 mg/dL    Comment: Performed at [facility], [address], [place], [place] [address]  Sedimentation rate     Status: None   Collection Time: 08/12/24  9:25 AM  Result Value Ref Range   Sed Rate 4 0 - 20 mm/hr    Comment: Performed at [facility], [address], [place], [place] [address]  Comprehensive metabolic panel with GFR     Status: Abnormal  Collection Time: 08/12/24  9:25 AM  Result Value Ref Range   Sodium 140 135 - 145 mmol/L   Potassium 5.8 (H) 3.5 - 5.1 mmol/L   Chloride 105 98 - 111 mmol/L   CO2 25 22 - 32 mmol/L   Glucose, Bld 148 (H) 70 - 99 mg/dL    Comment: Glucose reference range applies only to  samples taken after fasting for at least 8 hours.   BUN 22 (H) 6 - 20 mg/dL   Creatinine, Ser 9.33 0.44 - 1.00 mg/dL   Calcium 9.9 8.9 - 89.6 mg/dL   Total Protein 7.5 6.5 - 8.1 g/dL   Albumin 4.3 3.5 - 5.0 g/dL   AST 33 15 - 41 U/L   ALT 33 0 - 44 U/L   Alkaline Phosphatase 123 38 - 126 U/L   Total Bilirubin 0.3 0.0 - 1.2 mg/dL   GFR, Estimated >39 >39 mL/min    Comment: (NOTE) Calculated using the CKD-EPI Creatinine Equation (2021)    Anion gap 10 5 - 15    Comment: Performed at [facility], [address], [place], [place] [address]  Basic metabolic panel with GFR     Status: Abnormal   Collection Time: 08/16/24  4:03 PM  Result Value Ref Range   Sodium 135 135 - 145 mmol/L   Potassium 5.3 (H) 3.5 - 5.1 mmol/L   Chloride 104 98 - 111 mmol/L   CO2 20 (L) 22 - 32 mmol/L   Glucose, Bld 130 (H) 70 - 99 mg/dL    Comment: Glucose reference range applies only to samples taken after fasting for at least 8 hours.   BUN 20 6 - 20 mg/dL   Creatinine, Ser 8.74 (H) 0.44 - 1.00 mg/dL   Calcium 9.5 8.9 - 89.6 mg/dL   GFR, Estimated 53 (L) >60 mL/min    Comment: (NOTE) Calculated using the CKD-EPI Creatinine Equation (2021)    Anion gap 11 5 - 15    Comment: Performed at [facility], [address] [place], [place] [address]  Lipid Profile     Status: None   Collection Time: 09/03/24  8:44 AM  Result Value Ref Range   Cholesterol, Total 146 100 - 199 mg/dL   Triglycerides 896 0 - 149 mg/dL   HDL 89 >60 mg/dL   VLDL Cholesterol Cal 18 5 - 40 mg/dL   LDL Chol Calc (NIH) 39 0 - 99 mg/dL   Chol/HDL Ratio 1.6 0.0 - 4.4 ratio    Comment:                                   T. Chol/HDL Ratio                                             Men  Women                               1/2 Avg.Risk  3.4    3.3                                   Avg.Risk  5.0    4.4  2X Avg.Risk  9.6    7.1                                3X Avg.Risk 23.4   11.0    CMP14+EGFR     Status: Abnormal   Collection Time: 09/03/24  8:44 AM  Result Value Ref Range   Glucose 122 (H) 70 - 99 mg/dL   BUN 22 6 - 24 mg/dL   Creatinine, Ser 9.20 0.57 - 1.00 mg/dL   eGFR 92 >40 fO/fpw/8.26   BUN/Creatinine Ratio 28 (H) 9 - 23   Sodium 138 134 - 144 mmol/L   Potassium 5.1 3.5 - 5.2 mmol/L   Chloride 101 96 - 106 mmol/L   CO2 22 20 - 29 mmol/L   Calcium 10.2 8.7 - 10.2 mg/dL   Total Protein 6.7 6.0 - 8.5 g/dL   Albumin 4.5 3.9 - 4.9 g/dL   Globulin, Total 2.2 1.5 - 4.5 g/dL   Bilirubin Total 0.4 0.0 - 1.2 mg/dL   Alkaline Phosphatase 113 41 - 116 IU/L   AST 27 0 - 40 IU/L   ALT 39 (H) 0 - 32 IU/L  Hemoglobin A1c     Status: Abnormal   Collection Time: 09/03/24  8:44 AM  Result Value Ref Range   Hgb A1c MFr Bld 5.8 (H) 4.8 - 5.6 %    Comment:          Prediabetes: 5.7 - 6.4          Diabetes: >6.4          Glycemic control for adults with diabetes: <7.0    Est. average glucose Bld gHb Est-mCnc 120 mg/dL  TSH     Status: None   Collection Time: 09/03/24  8:44 AM  Result Value Ref Range   TSH 2.110 0.450 - 4.500 uIU/mL  Vitamin B12     Status: None   Collection Time: 09/03/24  8:44 AM  Result Value Ref Range   Vitamin B-12 814 232 - 1,245 pg/mL  POC CREATINE & ALBUMIN,URINE     Status: Abnormal   Collection Time: 09/03/24 10:38 AM  Result Value Ref Range   Microalbumin Ur, POC 80 mg/L   Creatinine, POC 100 mg/dL   Albumin/Creatinine Ratio, Urine, POC 30-300   Surgical pathology     Status: None   Collection Time: 09/14/24 12:00 AM  Result Value Ref Range   SURGICAL PATHOLOGY      SURGICAL PATHOLOGY [facility] [address] [place], [place] [address] Telephone [phone] or [phone] Fax [phone]  REPORT OF SURGICAL PATHOLOGY   Accession #: [id number] Patient Name: Danielle Harrison, Danielle Harrison Visit # : [id number]  MRN: [id number] Physician: Anice PONCE Rush DOB/Age December 31, 1975 (Age: 64) Gender: F Collected  Date: 09/14/2024 Received Date: 09/14/2024  FINAL DIAGNOSIS       1. Breast, right, needle core biopsy, 10:30 o'clock, 9cmfn, 9mm, heart clip :       -  FIBROADENOMA.       2. Breast, left, needle core biopsy, 12:30 o'clock, 3cmfn, 7mm, ribbon clip :       -  FIBROADENOMA WITH FIBROCYSTIC CHANGE, APOCRINE METAPLASIA AND FOCAL      CALCIFICATIONS.       ELECTRONIC SIGNATURE : Legolvan Do, Mark, Pathologist, Electronic Signature  MICROSCOPIC DESCRIPTION  CASE COMMENTS STAINS USED IN DIAGNOSIS: H&E-2 H&E-3 H&E-4 H&E H&E-2 H&E-3 H&E-4 H&E  CLINICAL HISTORY  SPECIMEN(S) OBTAINED 1. Bre ast, right, needle core biopsy, 10:30 O'clock, 9cmfn, 9mm, Heart Clip 2. Breast, left, needle core biopsy, 12:30 O'clock, 3cmfn, 7mm, Ribbon Clip  SPECIMEN COMMENTS: 1. TIF: 9:00 AM, CIT < 3 minutes; low suspicion masses bilaterally 2. TIF: 9:12 AM, CIT < 3 minutes SPECIMEN CLINICAL INFORMATION: 1. FA vs LN vs other benign mass, R/O malignancy/atypia 2. FA vs LN vs other benign mass, R/O malignancy/atypia    Gross Description 1. Received in formalin, TIF 9:00 a.m.CIT less than 3 mins, are four cores of soft tan yellow tissue 0.6 to 0.8 cm in length and 0.2 cm in diameter.The specimen is submitted in toto. 2. Received in formalin, TIF 9:12 a.m.CIT less than 3 mins, are four cores of soft tan yellow tissue 0.5 to 1.1 cm in length and each 0.2 cm in diameter.The specimen is submitted in toto.(GP:gt, 09/14/24)        Report signed out from the following location(s) [place] [address] [place], [place] [address] CLIA #: [id number]  [facility] [address] [place], [place] [address] CLIA #: [id number]       Assessment & Plan:  [place]   Problem List Items Addressed This Visit       Cardiovascular and Mediastinum   Hypertension associated with diabetes (HCC)     Nervous and Auditory   Non-recurrent acute serous otitis  media of left ear - Primary     Other   MRSA infection    Return if symptoms worsen or fail to improve, for as scheduled.   Total time spent: 25 minutes. This time includes review of previous notes and results and patient face to face interaction during today's visit.    Jeoffrey Pollen, NP  10/18/2024   This document may have been prepared by Dragon Voice Recognition software and as such may include unintentional dictation errors.       [1] Allergies Allergen Reactions   Diclofenac Hives  [2] Outpatient Medications Prior to Visit  Medication Sig   albuterol  (PROVENTIL ) (2.5 MG/3ML) 0.083% nebulizer solution Take 2.5 mg by nebulization every 6 (six) hours as needed for wheezing or shortness of breath.   albuterol  (VENTOLIN  HFA) 108 (90 Base) MCG/ACT inhaler Inhale 1-2 puffs into the lungs every 6 (six) hours as needed for wheezing or shortness of breath.   amLODipine  (NORVASC ) 5 MG tablet Take 1 tablet (5 mg total) by mouth daily.   Blood Glucose Monitoring Suppl DEVI 1 each by Does not apply route as directed. Dispense based on patient and insurance preference. Use up to four times daily as directed. (FOR ICD-10 E10.9, E11.9).   ciclopirox  (PENLAC ) 8 % solution APPLY TOPICALLY AT BEDTIME OVER NAIL AND SURROUNDING SKIN. APPLY DAILY OVER PREVIOUS COAT. AFTER 7 DAYS, MAY REMOVE WITH ALCOHOL AND CONTINUE CYCLE.   doxycycline  (VIBRA -TABS) 100 MG tablet Take 1 tablet (100 mg total) by mouth 2 (two) times daily.   gabapentin  (NEURONTIN ) 300 MG capsule Take 2 capsules (600 mg total) by mouth 3 (three) times daily.   Glucose Blood (BLOOD GLUCOSE TEST STRIPS) STRP 1 each by Does not apply route as directed. Dispense based on patient and insurance preference. Use up to four times daily as directed. (FOR ICD-10 E10.9, E11.9).   Lancet Device MISC 1 each by Does not apply route as directed. Dispense based on patient and insurance preference. Use up to four times daily as directed.  (FOR ICD-10 E10.9, E11.9).  Lancets MISC 1 each by Does not apply route as directed. Dispense based on patient and insurance preference. Use up to four times daily as directed. (FOR ICD-10 E10.9, E11.9).   meloxicam  (MOBIC ) 15 MG tablet TAKE 1 TABLET (15 MG TOTAL) BY MOUTH DAILY.   metFORMIN  (GLUCOPHAGE ) 500 MG tablet TAKE 1 TABLET BY MOUTH 2 TIMES DAILY WITH A MEAL.   methocarbamol  (ROBAXIN ) 500 MG tablet TAKE 1 TABLET BY MOUTH EVERY 6 HOURS AS NEEDED FOR MUSCLE SPASMS.   metoprolol  tartrate (LOPRESSOR ) 100 MG tablet TAKE 1 TABLET BY MOUTH TWICE A DAY   naloxone (NARCAN) nasal spray 4 mg/0.1 mL SMARTSIG:Both Nares   oxyCODONE -acetaminophen  (PERCOCET) 5-325 MG tablet Take 1 tablet by mouth every 4 (four) hours as needed for severe pain (pain score 7-10).   rosuvastatin (CRESTOR) 5 MG tablet Take 5 mg by mouth at bedtime.   senna-docusate (SENOKOT-S) 8.6-50 MG tablet Take 1 tablet by mouth daily.   SYMBICORT  160-4.5 MCG/ACT inhaler INHALE 2 PUFFS INTO THE LUNGS TWICE A DAY   tiZANidine  (ZANAFLEX ) 4 MG tablet TAKE 0.5-1 TABLETS (2-4 MG TOTAL) BY MOUTH 3 (THREE) TIMES DAILY.   methylPREDNISolone  (MEDROL  DOSEPAK) 4 MG TBPK tablet Take by mouth daily, taper daily dose per package instructions. (Patient not taking: Reported on 10/18/2024)   No facility-administered medications prior to visit.  "

## 2024-10-19 ENCOUNTER — Encounter: Admitting: Neurology

## 2024-10-19 ENCOUNTER — Encounter: Payer: Self-pay | Admitting: Cardiology

## 2024-10-19 DIAGNOSIS — G5603 Carpal tunnel syndrome, bilateral upper limbs: Secondary | ICD-10-CM

## 2024-10-19 DIAGNOSIS — R202 Paresthesia of skin: Secondary | ICD-10-CM

## 2024-10-19 DIAGNOSIS — H6502 Acute serous otitis media, left ear: Secondary | ICD-10-CM | POA: Insufficient documentation

## 2024-10-19 NOTE — Procedures (Signed)
 " [facility]  [address] [place], [address]  [place], [place] [address] Tel: [phone] Fax: [phone] Test Date:  10/19/2024  Patient: Danielle Harrison DOB: Aug 16, 1976 Physician: Venetia Potters, MD  Sex: Female Height: 4' 9 Ref Phys: Lyle Decamp, DEVONNA  ID#: [id number] Temp: 36.5 C Technician:    History: This is a 49 year old female with numbness and tingling in bilateral hands.  NCV & EMG Findings: Extensive electrodiagnostic evaluation of bilateral upper limbs shows: Right median sensory response is absent. Left median sensory response shows prolonged distal peak latency (5.8 ms) and reduced amplitude (8 V). Bilateral ulnar and radial sensory responses are within normal limits. Right median (APB) motor response is absent. Left median (APB) motor response shows prolonged distal onset latency (5.9 ms) and reduced amplitude (4.2 mV). Bilateral ulnar (ADM) motor responses are within normal limits. Chronic motor axon loss changes without accompanying active denervation changes are seen in bilateral abductor pollicis brevis (APB) muscles.  Impression: This is an abnormal study. 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 The findings are very severe in degree electrically on the right and moderate to severe in degree electrically on the left. No electrodiagnostic evidence of right or left cervical (C5-T1) motor radiculopathy. Screening studies for right or left ulnar or radial mononeuropathies are normal.    ___________________________ Venetia Potters, MD    Nerve Conduction Studies Motor Nerve Results    Latency Amplitude F-Lat Segment Distance CV Comment  Site (ms) Norm (mV) Norm (ms)  (cm) (m/s) Norm   Left Median (APB) Motor  Wrist *5.9  < 3.9 *4.2  > 6.0        Elbow 10.5 - 3.7 -  Elbow-Wrist 24 52  > 50   Right Median (APB) Motor  Wrist *NR  < 3.9 *NR  > 6.0         Elbow *NR - *NR -  Elbow-Wrist - *NR  > 50   Left Ulnar (ADM) Motor  Wrist 1.78  < 3.1 7.2  > 7.0        Bel elbow 4.8 - 6.7 -  Bel elbow-Wrist 18 60  > 50   Ab elbow 6.7 - 6.4 -  Ab elbow-Bel elbow 10 53 -   Right Ulnar (ADM) Motor  Wrist 1.78  < 3.1 9.1  > 7.0        Bel elbow 4.7 - 8.9 -  Bel elbow-Wrist 18 62  > 50   Ab elbow 6.6 - 8.9 -  Ab elbow-Bel elbow 10 53 -    Sensory Sites    Neg Peak Lat Amplitude (O-P) Segment Distance Velocity Comment  Site (ms) Norm (V) Norm  (cm) (ms)   Left Median Sensory  Wrist-Dig II *5.8  < 3.4 *8  > 20 Wrist-Dig II 13    Right Median Sensory  Wrist-Dig II *NR  < 3.4 *NR  > 20 Wrist-Dig II 13    Left Radial Sensory  Forearm-Wrist 2.0  < 2.7 24  > 18 Forearm-Wrist 10    Right Radial Sensory  Forearm-Wrist 2.0  < 2.7 34  > 18 Forearm-Wrist 10    Left Ulnar Sensory  Wrist-Dig V 2.5  < 3.1 41  > 12 Wrist-Dig V 11    Right Ulnar Sensory  Wrist-Dig V 2.5  < 3.1 25  > 12 Wrist-Dig V 11     Electromyography  Side Muscle Ins.Act Fibs Fasc Recrt Amp Dur Poly Activation Comment  Right FDI Nml Nml Nml Nml Nml Nml Nml Nml N/A  Right EIP Nml Nml Nml Nml Nml Nml Nml Nml N/A  Right Pronator teres Nml Nml Nml Nml Nml Nml Nml Nml N/A  Right APB *1- Nml Nml *3- Nml *1+ *1+ Nml *MMAV  Right Biceps Nml Nml Nml Nml Nml Nml Nml Nml N/A  Right Triceps Nml Nml Nml Nml Nml Nml Nml Nml N/A  Right Deltoid Nml Nml Nml Nml Nml Nml Nml Nml N/A  Left FDI Nml Nml Nml Nml Nml Nml Nml Nml N/A  Left EIP Nml Nml Nml Nml Nml Nml Nml Nml N/A  Left Pronator teres Nml Nml Nml Nml Nml Nml Nml Nml N/A  Left APB Nml Nml Nml *1- *1+ *1+ *1+ Nml N/A  Left Biceps Nml Nml Nml Nml Nml Nml Nml Nml N/A  Left Triceps Nml Nml Nml Nml Nml Nml Nml Nml N/A  Left Deltoid Nml Nml Nml Nml Nml Nml Nml Nml N/A      Waveforms:  Motor           Sensory               "

## 2024-10-24 ENCOUNTER — Other Ambulatory Visit: Payer: Self-pay | Admitting: Cardiology

## 2024-10-26 ENCOUNTER — Other Ambulatory Visit: Payer: Self-pay

## 2024-10-27 MED ORDER — GABAPENTIN 300 MG PO CAPS: 600.0000 mg | ORAL_CAPSULE | Freq: Three times a day (TID) | ORAL | 2 refills | Status: AC

## 2024-11-04 ENCOUNTER — Other Ambulatory Visit: Payer: Self-pay | Admitting: Cardiology

## 2024-11-04 ENCOUNTER — Ambulatory Visit: Admitting: Cardiology

## 2024-11-08 ENCOUNTER — Other Ambulatory Visit: Payer: Self-pay | Admitting: Cardiology

## 2024-11-09 ENCOUNTER — Ambulatory Visit: Admitting: Podiatry

## 2024-11-16 ENCOUNTER — Encounter: Payer: Self-pay | Admitting: Podiatry

## 2024-11-16 ENCOUNTER — Ambulatory Visit (INDEPENDENT_AMBULATORY_CARE_PROVIDER_SITE_OTHER): Admitting: Podiatry

## 2024-11-16 DIAGNOSIS — M86072 Acute hematogenous osteomyelitis, left ankle and foot: Secondary | ICD-10-CM

## 2024-11-16 DIAGNOSIS — M19079 Primary osteoarthritis, unspecified ankle and foot: Secondary | ICD-10-CM

## 2024-11-16 DIAGNOSIS — M205X1 Other deformities of toe(s) (acquired), right foot: Secondary | ICD-10-CM

## 2024-11-16 NOTE — Progress Notes (Signed)
 "  Subjective:  Patient ID: Danielle Harrison Free, female    DOB: 11-24-1975,  MRN: [id number]  Chief Complaint  Patient presents with   Foot Pain    Left foot arch pain. 8 pain. NIDDM A1C ? Right foot met 1 dorsal pain.       DOS: 03/03/2024 Procedure: 1. Partial resection of 1st metatarsal and 1st proximal phalanx, Left foot 2. Removal and reinsertion of antibiotic cement spacer non biodegradable implant, left foot  49 y.o. female seen for post op check.   Patient reports she is doing well in regards to the left foot.  She does not have pain where the prior surgery was.    On the right foot patient still having pain in the great toe joint.  Has about 5-10 pain.  She has steroid injection at the area last visit.  Says it did help but she is requesting another steroid injection.  She also notes she would like to discuss surgical options to correct the painful joint on her right first MPJ.  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  Has follow-up with infectious disease in April for this.  Review of Systems: Negative except as noted in the HPI. Denies N/V/F/Ch.   Objective:   Constitutional Well developed. Well nourished.  Vascular Foot warm and well perfused. Capillary refill normal to all digits.   No calf pain with palpation  Neurologic Normal speech. Oriented to person, place, and time. Epicritic sensation diminished to left foot  Dermatologic Decreased edema about the left foot first MPJ some tenderness to palpation at the plantar aspect of the spacer though slightly improved overall clinically.  There is no open wound    Orthopedic: Left foot hallux elevatus, decreased edema in the surgical site.   On the right foot first MPJ there is noted the osseous spurring both dorsally and medially at the first metatarsal head consistent with osteoarthritis and pain with flexion and extension of the first MPJ as well as with palpation.   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  Pathology: None taken 03/03/2024 IntraOp  Micro: 03/03/2024 wound swab culture from around the antibiotic cement spacer no growth   Assessment:   Painful and oversized antibiotics cement spacer status post removal and reinsertion of a smaller spacer with K wire fixation  Left foot flexor hallucis longus tendinitis at and proximal to the prior joint resection  Right foot with first MPJ hallux rigidus pain  Plan:  Patient was evaluated and treated and all questions answered.  #Flexor hallucis longus tendinitis left foot Patient does have evidence of possible tendinitis at the site of prior surgical intervention to include first MPJ resection. - Left foot has improved after prior steroid injection.  She is not requesting any intervention for the left first MPJ area at this time.  As it is not bothering her I recommend we continue to leave it alone and she has an antibiotic cement spacer in place but would not recommend removing it.  # Hallux valgus and hallux rigidus of the first MPJ -Recommend we proceed with a second steroid injection at  this visit.  She is agreeable to this.  After sterile prep injected 1 cc half percent Marcaine  plain with 1 cc triamcinolone  into and about the right first metatarsophalangeal joint. - Continue anti-inflammatory therapy and orthotics  - We also discussed surgical intervention would include first MPJ arthrodesis.  Discussed nonweightbearing for approximately 4 weeks following surgery with a cam boot.  She is agreeable to this would like to proceed.   After discussion of risk benefits alternatives and surgical complications patient still wishes to proceed.  I discussed that I would not be able to do this procedure for her unless cleared by infectious disease given her history of hematogenous osteomyelitis which raises the risk of putting any hardware in the body.  I want her to be cleared by her infectious disease doctor prior to proceeding with this procedure.  She states she will see her ID doctor in April and then we will plan for the procedure at some point after that.  Begin surgical planning for sometime in May or June - F/u Plan: Follow-up after surgery        Marolyn JULIANNA Honour, DPM [facility] / [facility] "

## 2024-11-19 ENCOUNTER — Ambulatory Visit: Admitting: Cardiology

## 2024-12-03 ENCOUNTER — Ambulatory Visit: Admitting: Cardiology

## 2025-02-01 ENCOUNTER — Ambulatory Visit: Admitting: Infectious Diseases

## 2025-02-08 ENCOUNTER — Ambulatory Visit: Admitting: Infectious Diseases
